# Patient Record
Sex: Female | Born: 1955 | Race: White | Hispanic: No | Marital: Married | Smoking: Former smoker
Health system: Southern US, Community
[De-identification: ages and names within clinical notes are randomized; demographics above are authoritative.]

## PROBLEM LIST (undated history)

## (undated) DIAGNOSIS — Z9889 Other specified postprocedural states: Secondary | ICD-10-CM

## (undated) DIAGNOSIS — Z923 Personal history of irradiation: Secondary | ICD-10-CM

## (undated) DIAGNOSIS — M4800 Spinal stenosis, site unspecified: Secondary | ICD-10-CM

## (undated) DIAGNOSIS — F909 Attention-deficit hyperactivity disorder, unspecified type: Secondary | ICD-10-CM

## (undated) DIAGNOSIS — F32A Depression, unspecified: Secondary | ICD-10-CM

## (undated) DIAGNOSIS — Z8659 Personal history of other mental and behavioral disorders: Secondary | ICD-10-CM

## (undated) DIAGNOSIS — Z973 Presence of spectacles and contact lenses: Secondary | ICD-10-CM

## (undated) DIAGNOSIS — R51 Headache: Secondary | ICD-10-CM

## (undated) DIAGNOSIS — E039 Hypothyroidism, unspecified: Secondary | ICD-10-CM

## (undated) DIAGNOSIS — M8580 Other specified disorders of bone density and structure, unspecified site: Secondary | ICD-10-CM

## (undated) DIAGNOSIS — F329 Major depressive disorder, single episode, unspecified: Secondary | ICD-10-CM

## (undated) DIAGNOSIS — K1120 Sialoadenitis, unspecified: Secondary | ICD-10-CM

## (undated) DIAGNOSIS — F419 Anxiety disorder, unspecified: Secondary | ICD-10-CM

## (undated) DIAGNOSIS — Z800 Family history of malignant neoplasm of digestive organs: Secondary | ICD-10-CM

## (undated) DIAGNOSIS — M797 Fibromyalgia: Secondary | ICD-10-CM

## (undated) DIAGNOSIS — M1990 Unspecified osteoarthritis, unspecified site: Secondary | ICD-10-CM

## (undated) DIAGNOSIS — R112 Nausea with vomiting, unspecified: Secondary | ICD-10-CM

## (undated) DIAGNOSIS — Z803 Family history of malignant neoplasm of breast: Secondary | ICD-10-CM

## (undated) DIAGNOSIS — L80 Vitiligo: Secondary | ICD-10-CM

## (undated) DIAGNOSIS — T7840XA Allergy, unspecified, initial encounter: Secondary | ICD-10-CM

## (undated) DIAGNOSIS — K219 Gastro-esophageal reflux disease without esophagitis: Secondary | ICD-10-CM

## (undated) DIAGNOSIS — Z8041 Family history of malignant neoplasm of ovary: Secondary | ICD-10-CM

## (undated) DIAGNOSIS — G43909 Migraine, unspecified, not intractable, without status migrainosus: Secondary | ICD-10-CM

## (undated) DIAGNOSIS — Z808 Family history of malignant neoplasm of other organs or systems: Secondary | ICD-10-CM

## (undated) DIAGNOSIS — R32 Unspecified urinary incontinence: Secondary | ICD-10-CM

## (undated) DIAGNOSIS — C50919 Malignant neoplasm of unspecified site of unspecified female breast: Secondary | ICD-10-CM

## (undated) HISTORY — DX: Headache: R51

## (undated) HISTORY — DX: Fibromyalgia: M79.7

## (undated) HISTORY — DX: Personal history of irradiation: Z92.3

## (undated) HISTORY — DX: Family history of malignant neoplasm of ovary: Z80.41

## (undated) HISTORY — DX: Allergy, unspecified, initial encounter: T78.40XA

## (undated) HISTORY — DX: Gastro-esophageal reflux disease without esophagitis: K21.9

## (undated) HISTORY — DX: Unspecified osteoarthritis, unspecified site: M19.90

## (undated) HISTORY — DX: Hypothyroidism, unspecified: E03.9

## (undated) HISTORY — DX: Family history of malignant neoplasm of other organs or systems: Z80.8

## (undated) HISTORY — DX: Vitiligo: L80

## (undated) HISTORY — DX: Attention-deficit hyperactivity disorder, unspecified type: F90.9

## (undated) HISTORY — PX: URETHRAL DILATION: SUR417

## (undated) HISTORY — PX: LAPAROSCOPIC OVARIAN CYSTECTOMY: SUR786

## (undated) HISTORY — DX: Depression, unspecified: F32.A

## (undated) HISTORY — DX: Sialoadenitis, unspecified: K11.20

## (undated) HISTORY — DX: Unspecified urinary incontinence: R32

## (undated) HISTORY — DX: Personal history of other mental and behavioral disorders: Z86.59

## (undated) HISTORY — DX: Spinal stenosis, site unspecified: M48.00

## (undated) HISTORY — PX: BREAST SURGERY: SHX581

## (undated) HISTORY — DX: Migraine, unspecified, not intractable, without status migrainosus: G43.909

## (undated) HISTORY — DX: Family history of malignant neoplasm of digestive organs: Z80.0

## (undated) HISTORY — DX: Family history of malignant neoplasm of breast: Z80.3

## (undated) HISTORY — DX: Other specified disorders of bone density and structure, unspecified site: M85.80

---
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
## 1980-06-22 HISTORY — PX: DIAGNOSTIC LAPAROSCOPY: SUR761

## 1994-06-22 HISTORY — PX: CHOLECYSTECTOMY: SHX55

## 1997-10-29 ENCOUNTER — Other Ambulatory Visit: Admission: RE | Admit: 1997-10-29 | Discharge: 1997-10-29 | Payer: Self-pay | Admitting: *Deleted

## 1998-01-23 ENCOUNTER — Ambulatory Visit (HOSPITAL_COMMUNITY): Admission: RE | Admit: 1998-01-23 | Discharge: 1998-01-23 | Payer: Self-pay | Admitting: Gastroenterology

## 1998-02-28 ENCOUNTER — Ambulatory Visit (HOSPITAL_COMMUNITY): Admission: RE | Admit: 1998-02-28 | Discharge: 1998-02-28 | Payer: Self-pay | Admitting: *Deleted

## 1999-07-21 ENCOUNTER — Ambulatory Visit (HOSPITAL_COMMUNITY): Admission: RE | Admit: 1999-07-21 | Discharge: 1999-07-21 | Payer: Self-pay | Admitting: *Deleted

## 1999-07-21 ENCOUNTER — Encounter: Payer: Self-pay | Admitting: *Deleted

## 2001-01-03 ENCOUNTER — Other Ambulatory Visit: Admission: RE | Admit: 2001-01-03 | Discharge: 2001-01-03 | Payer: Self-pay | Admitting: *Deleted

## 2001-04-29 ENCOUNTER — Ambulatory Visit (HOSPITAL_BASED_OUTPATIENT_CLINIC_OR_DEPARTMENT_OTHER): Admission: RE | Admit: 2001-04-29 | Discharge: 2001-04-29 | Payer: Self-pay | Admitting: Psychiatry

## 2002-04-18 ENCOUNTER — Other Ambulatory Visit: Admission: RE | Admit: 2002-04-18 | Discharge: 2002-04-18 | Payer: Self-pay | Admitting: Obstetrics and Gynecology

## 2002-10-12 ENCOUNTER — Encounter: Admission: RE | Admit: 2002-10-12 | Discharge: 2002-10-12 | Payer: Self-pay | Admitting: Family Medicine

## 2002-10-12 ENCOUNTER — Encounter: Payer: Self-pay | Admitting: Family Medicine

## 2003-07-24 ENCOUNTER — Other Ambulatory Visit: Admission: RE | Admit: 2003-07-24 | Discharge: 2003-07-24 | Payer: Self-pay | Admitting: Obstetrics and Gynecology

## 2004-01-01 ENCOUNTER — Ambulatory Visit (HOSPITAL_COMMUNITY): Admission: RE | Admit: 2004-01-01 | Discharge: 2004-01-01 | Payer: Self-pay | Admitting: Obstetrics and Gynecology

## 2004-08-20 ENCOUNTER — Ambulatory Visit: Payer: Self-pay | Admitting: Family Medicine

## 2004-10-07 ENCOUNTER — Other Ambulatory Visit: Admission: RE | Admit: 2004-10-07 | Discharge: 2004-10-07 | Payer: Self-pay | Admitting: *Deleted

## 2004-11-21 ENCOUNTER — Ambulatory Visit: Payer: Self-pay | Admitting: Family Medicine

## 2005-03-31 ENCOUNTER — Ambulatory Visit (HOSPITAL_COMMUNITY): Admission: RE | Admit: 2005-03-31 | Discharge: 2005-03-31 | Payer: Self-pay | Admitting: Family Medicine

## 2005-06-22 HISTORY — PX: COLONOSCOPY: SHX174

## 2005-08-07 ENCOUNTER — Encounter: Admission: RE | Admit: 2005-08-07 | Discharge: 2005-08-07 | Payer: Self-pay | Admitting: Obstetrics and Gynecology

## 2005-11-05 ENCOUNTER — Ambulatory Visit: Payer: Self-pay | Admitting: Family Medicine

## 2005-11-25 ENCOUNTER — Ambulatory Visit: Payer: Self-pay | Admitting: Family Medicine

## 2005-11-25 ENCOUNTER — Other Ambulatory Visit: Admission: RE | Admit: 2005-11-25 | Discharge: 2005-11-25 | Payer: Self-pay | Admitting: Family Medicine

## 2005-11-25 ENCOUNTER — Encounter: Payer: Self-pay | Admitting: Family Medicine

## 2006-04-08 ENCOUNTER — Encounter: Admission: RE | Admit: 2006-04-08 | Discharge: 2006-04-08 | Payer: Self-pay | Admitting: Obstetrics & Gynecology

## 2006-05-20 ENCOUNTER — Encounter: Payer: Self-pay | Admitting: Family Medicine

## 2006-06-08 ENCOUNTER — Encounter: Payer: Self-pay | Admitting: Family Medicine

## 2006-07-15 ENCOUNTER — Ambulatory Visit: Payer: Self-pay | Admitting: Family Medicine

## 2006-09-20 ENCOUNTER — Ambulatory Visit: Payer: Self-pay | Admitting: Family Medicine

## 2006-10-07 ENCOUNTER — Encounter: Admission: RE | Admit: 2006-10-07 | Discharge: 2006-10-07 | Payer: Self-pay | Admitting: Obstetrics & Gynecology

## 2006-10-08 ENCOUNTER — Other Ambulatory Visit: Admission: RE | Admit: 2006-10-08 | Discharge: 2006-10-08 | Payer: Self-pay | Admitting: Obstetrics & Gynecology

## 2006-10-12 ENCOUNTER — Encounter: Admission: RE | Admit: 2006-10-12 | Discharge: 2006-10-12 | Payer: Self-pay | Admitting: Obstetrics & Gynecology

## 2007-04-19 ENCOUNTER — Encounter: Payer: Self-pay | Admitting: Family Medicine

## 2007-05-05 ENCOUNTER — Ambulatory Visit: Payer: Self-pay | Admitting: Family Medicine

## 2007-05-05 DIAGNOSIS — M1990 Unspecified osteoarthritis, unspecified site: Secondary | ICD-10-CM | POA: Insufficient documentation

## 2007-05-05 DIAGNOSIS — E039 Hypothyroidism, unspecified: Secondary | ICD-10-CM

## 2007-05-05 DIAGNOSIS — K219 Gastro-esophageal reflux disease without esophagitis: Secondary | ICD-10-CM

## 2007-05-05 DIAGNOSIS — J309 Allergic rhinitis, unspecified: Secondary | ICD-10-CM

## 2007-05-05 DIAGNOSIS — F329 Major depressive disorder, single episode, unspecified: Secondary | ICD-10-CM

## 2007-05-10 ENCOUNTER — Encounter: Payer: Self-pay | Admitting: Family Medicine

## 2007-12-20 ENCOUNTER — Ambulatory Visit: Payer: Self-pay | Admitting: Family Medicine

## 2007-12-21 LAB — CONVERTED CEMR LAB
AST: 19 units/L (ref 0–37)
Alkaline Phosphatase: 44 units/L (ref 39–117)
Bilirubin, Direct: 0.1 mg/dL (ref 0.0–0.3)
CO2: 29 meq/L (ref 19–32)
Chloride: 107 meq/L (ref 96–112)
GFR calc Af Amer: 75 mL/min
Glucose, Bld: 98 mg/dL (ref 70–99)
HDL: 64.5 mg/dL (ref >39.0)
LDL Cholesterol: 86 mg/dL (ref 0–99)
Lymphocytes Relative: 24.8 % (ref 12.0–46.0)
Monocytes Absolute: 0.3 10*3/uL (ref 0.1–1.0)
Monocytes Relative: 5.4 % (ref 3.0–12.0)
Neutrophils Relative %: 67.5 % (ref 43.0–77.0)
Platelets: 202 10*3/uL (ref 150–400)
Potassium: 4.5 meq/L (ref 3.5–5.1)
RDW: 11.7 % (ref 11.5–14.6)
Sodium: 144 meq/L (ref 135–145)
Total CHOL/HDL Ratio: 2.6
Total Protein: 7.5 g/dL (ref 6.0–8.3)
Triglycerides: 78 mg/dL (ref 0–149)
VLDL: 16 mg/dL (ref 0–40)

## 2007-12-30 ENCOUNTER — Telehealth: Payer: Self-pay | Admitting: Family Medicine

## 2008-01-03 ENCOUNTER — Telehealth: Payer: Self-pay | Admitting: Family Medicine

## 2008-01-04 ENCOUNTER — Telehealth: Payer: Self-pay | Admitting: Family Medicine

## 2008-01-05 ENCOUNTER — Encounter: Payer: Self-pay | Admitting: Family Medicine

## 2008-01-10 ENCOUNTER — Encounter: Payer: Self-pay | Admitting: Family Medicine

## 2008-02-08 ENCOUNTER — Encounter: Payer: Self-pay | Admitting: Family Medicine

## 2008-02-15 ENCOUNTER — Encounter: Admission: RE | Admit: 2008-02-15 | Discharge: 2008-02-15 | Payer: Self-pay | Admitting: Obstetrics & Gynecology

## 2008-02-20 ENCOUNTER — Other Ambulatory Visit: Admission: RE | Admit: 2008-02-20 | Discharge: 2008-02-20 | Payer: Self-pay | Admitting: Obstetrics & Gynecology

## 2008-03-20 ENCOUNTER — Encounter: Payer: Self-pay | Admitting: Family Medicine

## 2008-07-09 ENCOUNTER — Encounter: Payer: Self-pay | Admitting: Family Medicine

## 2008-07-11 ENCOUNTER — Telehealth: Payer: Self-pay | Admitting: Family Medicine

## 2008-08-24 ENCOUNTER — Ambulatory Visit: Payer: Self-pay | Admitting: Family Medicine

## 2008-08-24 ENCOUNTER — Telehealth: Payer: Self-pay | Admitting: Family Medicine

## 2008-09-07 ENCOUNTER — Encounter: Payer: Self-pay | Admitting: Family Medicine

## 2008-09-18 ENCOUNTER — Encounter: Payer: Self-pay | Admitting: Family Medicine

## 2008-12-03 ENCOUNTER — Ambulatory Visit: Payer: Self-pay | Admitting: Family Medicine

## 2009-01-17 ENCOUNTER — Encounter: Payer: Self-pay | Admitting: Family Medicine

## 2009-02-11 ENCOUNTER — Telehealth (INDEPENDENT_AMBULATORY_CARE_PROVIDER_SITE_OTHER): Payer: Self-pay | Admitting: *Deleted

## 2009-03-13 ENCOUNTER — Encounter: Admission: RE | Admit: 2009-03-13 | Discharge: 2009-03-13 | Payer: Self-pay | Admitting: Family Medicine

## 2009-04-03 ENCOUNTER — Ambulatory Visit: Payer: Self-pay | Admitting: Family Medicine

## 2009-04-03 DIAGNOSIS — R51 Headache: Secondary | ICD-10-CM

## 2009-05-15 ENCOUNTER — Ambulatory Visit: Payer: Self-pay | Admitting: Family Medicine

## 2009-05-21 LAB — CONVERTED CEMR LAB
AST: 19 units/L (ref 0–37)
Albumin: 4 g/dL (ref 3.5–5.2)
Alkaline Phosphatase: 43 units/L (ref 39–117)
Basophils Relative: 0.6 % (ref 0.0–3.0)
CO2: 33 meq/L — ABNORMAL HIGH (ref 19–32)
Calcium: 9.6 mg/dL (ref 8.4–10.5)
Direct LDL: 119.9 mg/dL
GFR calc non Af Amer: 61.5 mL/min (ref >60)
Hemoglobin, Urine: NEGATIVE
Hemoglobin: 14 g/dL (ref 12.0–15.0)
Ketones, ur: NEGATIVE mg/dL
Lymphocytes Relative: 35.1 % (ref 12.0–46.0)
Monocytes Relative: 6 % (ref 3.0–12.0)
Neutro Abs: 2.4 10*3/uL (ref 1.4–7.7)
RBC: 4.18 M/uL (ref 3.87–5.11)
Sodium: 142 meq/L (ref 135–145)
Total CHOL/HDL Ratio: 3
Total Protein: 7.1 g/dL (ref 6.0–8.3)
Triglycerides: 74 mg/dL (ref 0.0–149.0)
Urine Glucose: NEGATIVE mg/dL
Urobilinogen, UA: 0.2 (ref 0.0–1.0)

## 2009-05-23 ENCOUNTER — Ambulatory Visit: Payer: Self-pay | Admitting: Family Medicine

## 2009-05-23 ENCOUNTER — Other Ambulatory Visit: Admission: RE | Admit: 2009-05-23 | Discharge: 2009-05-23 | Payer: Self-pay | Admitting: Family Medicine

## 2009-05-28 ENCOUNTER — Encounter: Payer: Self-pay | Admitting: Family Medicine

## 2009-08-26 ENCOUNTER — Telehealth: Payer: Self-pay | Admitting: Family Medicine

## 2009-12-26 ENCOUNTER — Telehealth: Payer: Self-pay | Admitting: Family Medicine

## 2010-02-12 ENCOUNTER — Telehealth: Payer: Self-pay | Admitting: Family Medicine

## 2010-05-13 ENCOUNTER — Encounter: Admission: RE | Admit: 2010-05-13 | Discharge: 2010-05-13 | Payer: Self-pay | Admitting: Family Medicine

## 2010-06-26 ENCOUNTER — Ambulatory Visit
Admission: RE | Admit: 2010-06-26 | Discharge: 2010-06-26 | Payer: Self-pay | Source: Home / Self Care | Attending: Family Medicine | Admitting: Family Medicine

## 2010-06-26 ENCOUNTER — Other Ambulatory Visit: Payer: Self-pay | Admitting: Family Medicine

## 2010-06-26 LAB — CONVERTED CEMR LAB
Blood in Urine, dipstick: NEGATIVE
Ketones, urine, test strip: NEGATIVE
Nitrite: NEGATIVE
Specific Gravity, Urine: 1.025
Urobilinogen, UA: 0.2

## 2010-06-26 LAB — CBC WITH DIFFERENTIAL/PLATELET
Basophils Absolute: 0 10*3/uL (ref 0.0–0.1)
Basophils Relative: 0.8 % (ref 0.0–3.0)
Eosinophils Absolute: 0.2 10*3/uL (ref 0.0–0.7)
Eosinophils Relative: 3.6 % (ref 0.0–5.0)
HCT: 38.5 % (ref 36.0–46.0)
Hemoglobin: 13.1 g/dL (ref 12.0–15.0)
Lymphocytes Relative: 46 % (ref 12.0–46.0)
Lymphs Abs: 2 10*3/uL (ref 0.7–4.0)
MCHC: 34 g/dL (ref 30.0–36.0)
MCV: 95.5 fl (ref 78.0–100.0)
Monocytes Absolute: 0.3 10*3/uL (ref 0.1–1.0)
Monocytes Relative: 6 % (ref 3.0–12.0)
Neutro Abs: 1.9 10*3/uL (ref 1.4–7.7)
Neutrophils Relative %: 43.6 % (ref 43.0–77.0)
Platelets: 207 10*3/uL (ref 150.0–400.0)
RBC: 4.03 Mil/uL (ref 3.87–5.11)
RDW: 13 % (ref 11.5–14.6)
WBC: 4.4 10*3/uL — ABNORMAL LOW (ref 4.5–10.5)

## 2010-06-27 LAB — LIPID PANEL
Cholesterol: 181 mg/dL (ref 0–200)
HDL: 54.4 mg/dL (ref >39.00)
LDL Cholesterol: 102 mg/dL — ABNORMAL HIGH (ref 0–99)
Total CHOL/HDL Ratio: 3
Triglycerides: 122 mg/dL (ref 0.0–149.0)
VLDL: 24.4 mg/dL (ref 0.0–40.0)

## 2010-06-27 LAB — BASIC METABOLIC PANEL
BUN: 11 mg/dL (ref 6–23)
CO2: 31 mEq/L (ref 19–32)
Calcium: 9.5 mg/dL (ref 8.4–10.5)
Chloride: 103 mEq/L (ref 96–112)
Creatinine, Ser: 1.1 mg/dL (ref 0.4–1.2)
GFR: 54.29 mL/min — ABNORMAL LOW (ref >60.00)
Glucose, Bld: 84 mg/dL (ref 70–99)
Potassium: 4.4 mEq/L (ref 3.5–5.1)
Sodium: 140 mEq/L (ref 135–145)

## 2010-06-27 LAB — HEPATIC FUNCTION PANEL
ALT: 18 U/L (ref 0–35)
AST: 26 U/L (ref 0–37)
Albumin: 3.7 g/dL (ref 3.5–5.2)
Alkaline Phosphatase: 39 U/L (ref 39–117)
Bilirubin, Direct: 0.1 mg/dL (ref 0.0–0.3)
Total Bilirubin: 0.5 mg/dL (ref 0.3–1.2)
Total Protein: 6.3 g/dL (ref 6.0–8.3)

## 2010-06-27 LAB — TSH: TSH: 2.29 u[IU]/mL (ref 0.35–5.50)

## 2010-07-02 ENCOUNTER — Ambulatory Visit: Admit: 2010-07-02 | Payer: Self-pay | Admitting: Family Medicine

## 2010-07-12 ENCOUNTER — Encounter: Payer: Self-pay | Admitting: Obstetrics and Gynecology

## 2010-07-13 ENCOUNTER — Encounter: Payer: Self-pay | Admitting: Obstetrics & Gynecology

## 2010-07-19 ENCOUNTER — Encounter: Payer: Self-pay | Admitting: Family Medicine

## 2010-07-22 NOTE — Progress Notes (Signed)
Summary: refill trazodone with new directions  Phone Note Call from Patient Call back at [phone]   Caller: Patient Call For: Harlei Lehrmann Summary of Call: Pt takes trazodone 100mg  and she uses it for sleep.  She takes 2 at a time instead of 1 and needs a refill for 1-2 as needed #60 at [facility] Initial call taken by: Alfred Levins, CMA,  August 26, 2009 8:59 AM  Follow-up for Phone Call        okay. Call in #60 with 11 rf Follow-up by: Nelwyn Salisbury MD,  August 26, 2009 1:31 PM  Additional Follow-up for Phone Call Additional follow up Details #1::        Rx Called In Additional Follow-up by: Raechel Ache, RN,  August 26, 2009 2:26 PM    New/Updated Medications: TRAZODONE HCL 100 MG TABS (TRAZODONE HCL) 2  by mouth at bedtime Prescriptions: TRAZODONE HCL 100 MG TABS (TRAZODONE HCL) 2  by mouth at bedtime  #60 x 11   Entered by:   Raechel Ache, RN   Authorized by:   Nelwyn Salisbury MD   Signed by:   Raechel Ache, RN on 08/26/2009   Method used:   Electronically to        [id number]       [address]       [place], [place]  [address]       Ph: [phone] or [phone]       Fax: [phone]   RxID:   [id number]

## 2010-07-22 NOTE — Progress Notes (Signed)
Summary: app for breast pain  Phone Note Call from Patient   Caller: Patient Call For: Nelwyn Salisbury MD Summary of Call: Pt is calling asking for appt for breast pain on voice mail. LMTCB [phone] Initial call taken by: Lynann Beaver CMA,  December 26, 2009 1:42 PM  Follow-up for Phone Call        No cardiac symptoms and pt prefers to wait until Dr. Clent Ridges gets back. Appt scheduled and cardiac alert signs discussed, and what to do regarding ER.  ? costochondritis??? Follow-up by: Lynann Beaver CMA,  December 26, 2009 2:19 PM

## 2010-07-22 NOTE — Progress Notes (Signed)
Summary: refill alprazolam  Phone Note Refill Request Message from:  Fax from Pharmacy on February 12, 2010 8:26 AM  Refills Requested: Medication #1:  XANAX 0.5 MG TABS two times a day as needed anxiety   Supply Requested: 1 month   Last Refilled: 03/06/2009   Notes: refill is for alprazolam 1mg  tid prn  Method Requested: Fax to Local Pharmacy Initial call taken by: Raechel Ache, RN,  February 12, 2010 8:28 AM Caller: [facility]  Follow-up for Phone Call        done, in your box Follow-up by: Nelwyn Salisbury MD,  February 12, 2010 4:43 PM    New/Updated Medications: XANAX 0.5 MG TABS (ALPRAZOLAM) two times a day as needed anxiety Prescriptions: XANAX 0.5 MG TABS (ALPRAZOLAM) two times a day as needed anxiety  #180 x 1   Entered and Authorized by:   Nelwyn Salisbury MD   Signed by:   Nelwyn Salisbury MD on 02/12/2010   Method used:   Print then Give to Patient   RxID:   [id number]  [facility]

## 2010-07-29 ENCOUNTER — Other Ambulatory Visit: Payer: Self-pay | Admitting: Family Medicine

## 2010-07-29 DIAGNOSIS — M1990 Unspecified osteoarthritis, unspecified site: Secondary | ICD-10-CM

## 2010-07-30 ENCOUNTER — Ambulatory Visit (INDEPENDENT_AMBULATORY_CARE_PROVIDER_SITE_OTHER): Payer: 59 | Admitting: Family Medicine

## 2010-07-30 ENCOUNTER — Encounter: Payer: Self-pay | Admitting: Family Medicine

## 2010-07-30 VITALS — BP 110/70 | HR 100 | Ht 66.0 in | Wt 174.0 lb

## 2010-07-30 DIAGNOSIS — Z136 Encounter for screening for cardiovascular disorders: Secondary | ICD-10-CM

## 2010-07-30 DIAGNOSIS — Z0000 Encounter for general adult medical examination without abnormal findings: Secondary | ICD-10-CM

## 2010-07-30 DIAGNOSIS — M1990 Unspecified osteoarthritis, unspecified site: Secondary | ICD-10-CM

## 2010-07-30 MED ORDER — TRAZODONE HCL 100 MG PO TABS: 200.0000 mg | ORAL_TABLET | Freq: Every day | ORAL | Status: DC

## 2010-07-30 MED ORDER — ALPRAZOLAM 0.5 MG PO TABS: 0.5000 mg | ORAL_TABLET | Freq: Two times a day (BID) | ORAL | Status: DC | PRN

## 2010-07-30 MED ORDER — LEVOTHYROXINE SODIUM 100 MCG PO TABS: 100.0000 ug | ORAL_TABLET | Freq: Every day | ORAL | Status: DC

## 2010-07-30 MED ORDER — CELECOXIB 100 MG PO CAPS: 200.0000 mg | ORAL_CAPSULE | Freq: Two times a day (BID) | ORAL | Status: DC

## 2010-07-30 MED ORDER — LEVOTHYROXINE SODIUM 112 MCG PO TABS: 112.0000 ug | ORAL_TABLET | Freq: Every day | ORAL | Status: DC

## 2010-07-30 NOTE — Progress Notes (Signed)
  Subjective:    Patient ID: Kristina Pearson, female    DOB: [birth year], 55 y.o.   MRN: [id number]  HPI 55 yr old female for a cpx. She feels well with no concerns.    Review of Systems  Constitutional: Negative.  Negative for fever, diaphoresis, activity change, appetite change, fatigue and unexpected weight change.  HENT: Negative.  Negative for hearing loss, ear pain, nosebleeds, congestion, sore throat, trouble swallowing, neck pain, neck stiffness, voice change and tinnitus.   Eyes: Negative.  Negative for photophobia, pain, discharge, redness and visual disturbance.  Respiratory: Negative.  Negative for apnea, cough, choking, chest tightness, shortness of breath, wheezing and stridor.   Cardiovascular: Negative.  Negative for chest pain, palpitations and leg swelling.  Gastrointestinal: Negative.  Negative for nausea, vomiting, abdominal pain, diarrhea, constipation, blood in stool, abdominal distention and rectal pain.  Genitourinary: Negative.  Negative for dysuria, urgency, frequency, flank pain, scrotal swelling, enuresis, difficulty urinating and testicular pain.  Musculoskeletal: Negative.  Negative for myalgias, back pain, joint swelling, arthralgias and gait problem.  Skin: Negative.  Negative for color change, pallor, rash and wound.  Neurological: Negative.  Negative for dizziness, tremors, seizures, syncope, speech difficulty, weakness, light-headedness, numbness and headaches.  Hematological: Negative.  Negative for adenopathy. Does not bruise/bleed easily.  Psychiatric/Behavioral: Negative.  Negative for hallucinations, behavioral problems, confusion, sleep disturbance, dysphoric mood and agitation. The patient is not nervous/anxious.        Objective:   Physical Exam  Constitutional: She appears well-developed and well-nourished. No distress.  HENT:  Head: Normocephalic and atraumatic.  Right Ear: External ear normal.  Left Ear: External ear normal.  Nose: Nose normal.    Mouth/Throat: Oropharynx is clear and moist. No oropharyngeal exudate.  Eyes: Conjunctivae and EOM are normal. Pupils are equal, round, and reactive to light. Right eye exhibits no discharge. Left eye exhibits no discharge. No scleral icterus.  Neck: Normal range of motion. Neck supple. No JVD present. No thyromegaly present.  Cardiovascular: Normal rate, regular rhythm, normal heart sounds and intact distal pulses.  Exam reveals no gallop and no friction rub.   No murmur heard. Pulmonary/Chest: Effort normal and breath sounds normal. No stridor. No respiratory distress. She has no wheezes. She has no rales. She exhibits no tenderness.  Abdominal: Soft. Normal appearance and bowel sounds are normal. She exhibits no distension, no abdominal bruit, no ascites and no mass. There is no hepatosplenomegaly. There is no tenderness. There is no rigidity, no rebound and no guarding. No hernia.  Musculoskeletal: Normal range of motion. She exhibits no edema and no tenderness.  Lymphadenopathy:    She has no cervical adenopathy.  Neurological: She is alert. She has normal reflexes. No cranial nerve deficit. She exhibits normal muscle tone. Coordination normal.  Skin: Skin is warm and dry. No rash noted. She is not diaphoretic. No erythema. No pallor.  Psychiatric: She has a normal mood and affect. Her behavior is normal. Judgment and thought content normal.          Assessment & Plan:  Normal exam.

## 2010-08-07 ENCOUNTER — Telehealth: Payer: Self-pay | Admitting: Family Medicine

## 2010-08-07 NOTE — Telephone Encounter (Signed)
Pt req samples for Synthroid , until her mail order arrives. Pls let pt know if samples are available.

## 2010-08-07 NOTE — Telephone Encounter (Signed)
Pt notified samples synthroid 112 mcg  #28 lot [id number] exp 11-2010 Samples at front desk.

## 2010-08-21 ENCOUNTER — Ambulatory Visit: Payer: 59 | Admitting: Family Medicine

## 2010-08-22 ENCOUNTER — Telehealth: Payer: Self-pay | Admitting: Family Medicine

## 2010-08-22 NOTE — Telephone Encounter (Signed)
rx called in, pt aware 

## 2010-08-22 NOTE — Telephone Encounter (Signed)
Call in Xanax 0.5 mg bid #60 with no rf

## 2010-08-22 NOTE — Telephone Encounter (Signed)
Pt needs 14 day supply of alprazolam 0.5mg  call [facility] [address] [phone]. Pt is waiting on mailorder rx

## 2010-08-28 ENCOUNTER — Other Ambulatory Visit: Payer: Self-pay | Admitting: Family Medicine

## 2010-08-28 NOTE — Telephone Encounter (Signed)
[facility] has ?concerning synthoid. Please call [phone] opt 2

## 2010-09-01 ENCOUNTER — Other Ambulatory Visit: Payer: Self-pay | Admitting: Family Medicine

## 2010-09-02 ENCOUNTER — Other Ambulatory Visit: Payer: Self-pay

## 2010-09-02 NOTE — Telephone Encounter (Signed)
Received another call from [facility] ref # [id number] on dosage of synthroid stated on August 22 2010 they received a fax from dr fry with pt on 100 mcg and 112 mcg . Dr Clent Ridges is aware and awaiting  Return call from pt.

## 2010-09-02 NOTE — Telephone Encounter (Addendum)
Spoke with joan from [facility]  she stated she sent out synthroid 112 and 100 mcg which one was from dr fry and Dr Horald Pollen . Dr Clent Ridges aware and requested to call pt regarding this.  Called pt left mess for her to return call

## 2010-09-02 NOTE — Telephone Encounter (Signed)
Pt returned call and stated Dr Horald Pollen does have her on Synthroid alternating with 112 next day. Dr Clent Ridges aware.

## 2010-10-28 ENCOUNTER — Ambulatory Visit: Payer: 59 | Admitting: Psychology

## 2010-12-25 ENCOUNTER — Ambulatory Visit (INDEPENDENT_AMBULATORY_CARE_PROVIDER_SITE_OTHER): Payer: 59 | Admitting: Psychology

## 2010-12-25 DIAGNOSIS — F4323 Adjustment disorder with mixed anxiety and depressed mood: Secondary | ICD-10-CM

## 2010-12-30 ENCOUNTER — Ambulatory Visit (INDEPENDENT_AMBULATORY_CARE_PROVIDER_SITE_OTHER): Payer: 59 | Admitting: Psychology

## 2010-12-30 DIAGNOSIS — F4323 Adjustment disorder with mixed anxiety and depressed mood: Secondary | ICD-10-CM

## 2011-01-13 ENCOUNTER — Other Ambulatory Visit: Payer: Self-pay

## 2011-01-13 DIAGNOSIS — Z0000 Encounter for general adult medical examination without abnormal findings: Secondary | ICD-10-CM

## 2011-01-13 NOTE — Telephone Encounter (Signed)
Last filled 08/22/2010. Last OV 07/30/10

## 2011-01-15 ENCOUNTER — Ambulatory Visit: Payer: 59 | Admitting: Psychology

## 2011-01-15 MED ORDER — ALPRAZOLAM 0.5 MG PO TABS: 0.5000 mg | ORAL_TABLET | Freq: Two times a day (BID) | ORAL | Status: DC | PRN

## 2011-01-15 NOTE — Telephone Encounter (Signed)
Script called in

## 2011-01-15 NOTE — Telephone Encounter (Signed)
Call in #180 with one rf  

## 2011-01-27 ENCOUNTER — Ambulatory Visit (INDEPENDENT_AMBULATORY_CARE_PROVIDER_SITE_OTHER): Payer: 59 | Admitting: Psychology

## 2011-01-27 DIAGNOSIS — F4323 Adjustment disorder with mixed anxiety and depressed mood: Secondary | ICD-10-CM

## 2011-01-30 ENCOUNTER — Ambulatory Visit (INDEPENDENT_AMBULATORY_CARE_PROVIDER_SITE_OTHER): Payer: 59 | Admitting: Family Medicine

## 2011-01-30 ENCOUNTER — Encounter: Payer: Self-pay | Admitting: Family Medicine

## 2011-01-30 VITALS — BP 102/64 | HR 78 | Temp 98.0°F | Wt 170.0 lb

## 2011-01-30 DIAGNOSIS — F909 Attention-deficit hyperactivity disorder, unspecified type: Secondary | ICD-10-CM

## 2011-01-30 DIAGNOSIS — L989 Disorder of the skin and subcutaneous tissue, unspecified: Secondary | ICD-10-CM

## 2011-01-30 MED ORDER — LISDEXAMFETAMINE DIMESYLATE 20 MG PO CAPS: 20.0000 mg | ORAL_CAPSULE | ORAL | Status: DC

## 2011-01-30 NOTE — Progress Notes (Signed)
  Subjective:    Patient ID: Kristina Pearson, female    DOB: Feb 20, 1956, 55 y.o.   MRN: [id number]  HPI Here for 2 issues. First she has had a lesion on the left lower leg for several years. It was stable for a few years, but over the past 6 months it has rapidly gotten bigger and it now itches. Second, she feels she has ADHD. She had trouble in school and has a lot of trouble now on her job with focusing on work, getting things done on time, and forgetting simple things. This has always caused her to be stressed because she is always behind. Her daughter (age 46)  has ADHD and takes Vyvanse. Kristina Pearson took a few of these last week and was amazed at how much better she felt and how well she performed at work. No side effects at all.    Review of Systems  Constitutional: Negative.   Skin: Positive for color change. Negative for pallor, rash and wound.  Psychiatric/Behavioral: Positive for decreased concentration. Negative for hallucinations, behavioral problems, confusion, dysphoric mood and agitation. The patient is nervous/anxious. The patient is not hyperactive.        Objective:   Physical Exam  Constitutional: She is oriented to person, place, and time. She appears well-developed and well-nourished.  Neurological: She is alert and oriented to person, place, and time.  Skin:       The anterior left lower leg has a macular lesion about 1.0 cm in diameter. This is well marginated. Parts of it are dark tan in color, and parts are depigmented.   Psychiatric: She has a normal mood and affect. Her behavior is normal. Thought content normal.          Assessment & Plan:  I do think she has ADHD, and we will try Vyvanse. Recheck in one month. Refer to Dermatology for the skin lesion, since these recent changes are a bit worrisome.

## 2011-02-09 ENCOUNTER — Other Ambulatory Visit: Payer: Self-pay

## 2011-02-09 NOTE — Telephone Encounter (Signed)
Fax refill request from [facility] for alprazolam  Last seen 01/30/11   Last written 01/14/2011 #180 1rf BUT CALLED INTO [facility] PLEASE ADVISE

## 2011-02-10 ENCOUNTER — Ambulatory Visit (INDEPENDENT_AMBULATORY_CARE_PROVIDER_SITE_OTHER): Payer: 59 | Admitting: Psychology

## 2011-02-10 DIAGNOSIS — F4323 Adjustment disorder with mixed anxiety and depressed mood: Secondary | ICD-10-CM

## 2011-02-10 NOTE — Telephone Encounter (Signed)
Please change this rx to [facility], same instructions

## 2011-02-10 NOTE — Telephone Encounter (Signed)
Refill request for [facility] was faxed.

## 2011-02-17 ENCOUNTER — Ambulatory Visit (INDEPENDENT_AMBULATORY_CARE_PROVIDER_SITE_OTHER): Payer: 59 | Admitting: Psychology

## 2011-02-17 DIAGNOSIS — F4323 Adjustment disorder with mixed anxiety and depressed mood: Secondary | ICD-10-CM

## 2011-03-05 ENCOUNTER — Ambulatory Visit (INDEPENDENT_AMBULATORY_CARE_PROVIDER_SITE_OTHER): Payer: 59 | Admitting: Psychology

## 2011-03-05 DIAGNOSIS — F4323 Adjustment disorder with mixed anxiety and depressed mood: Secondary | ICD-10-CM

## 2011-03-19 ENCOUNTER — Ambulatory Visit (INDEPENDENT_AMBULATORY_CARE_PROVIDER_SITE_OTHER): Payer: 59 | Admitting: Psychology

## 2011-03-19 DIAGNOSIS — F4323 Adjustment disorder with mixed anxiety and depressed mood: Secondary | ICD-10-CM

## 2011-04-03 ENCOUNTER — Other Ambulatory Visit: Payer: Self-pay | Admitting: Family Medicine

## 2011-04-03 MED ORDER — TRAZODONE HCL 100 MG PO TABS: 100.0000 mg | ORAL_TABLET | Freq: Every day | ORAL | Status: DC

## 2011-04-03 NOTE — Telephone Encounter (Addendum)
Pt need new rx trazodone 100mg  #60 a month( 2 pills at night). Please call [facility] [phone]. Pt was getting med from [facility]. Pt has only 2 pills left.

## 2011-04-03 NOTE — Telephone Encounter (Signed)
Call in #60 with 11 rf 

## 2011-04-03 NOTE — Telephone Encounter (Signed)
Script called in and pt aware. 

## 2011-04-08 ENCOUNTER — Ambulatory Visit (INDEPENDENT_AMBULATORY_CARE_PROVIDER_SITE_OTHER): Payer: BC Managed Care – PPO | Admitting: Family Medicine

## 2011-04-08 ENCOUNTER — Encounter: Payer: Self-pay | Admitting: Family Medicine

## 2011-04-08 VITALS — BP 116/78 | HR 81 | Temp 98.7°F | Wt 168.0 lb

## 2011-04-08 DIAGNOSIS — Z23 Encounter for immunization: Secondary | ICD-10-CM

## 2011-04-08 DIAGNOSIS — F909 Attention-deficit hyperactivity disorder, unspecified type: Secondary | ICD-10-CM

## 2011-04-08 MED ORDER — LISDEXAMFETAMINE DIMESYLATE 20 MG PO CAPS: 20.0000 mg | ORAL_CAPSULE | ORAL | Status: DC

## 2011-04-08 NOTE — Progress Notes (Signed)
  Subjective:    Patient ID: Kristina Pearson, female    DOB: December 03, 1955, 55 y.o.   MRN: [id number]  HPI Here to follow upon newly diagnosed ADHD. She has been very pleased with Vyvanse and wants to stay on this. She is much more focused at work and is able to keep up with the work load. No side effects.    Review of Systems  Constitutional: Negative.   Psychiatric/Behavioral: Negative.        Objective:   Physical Exam  Constitutional: She appears well-developed and well-nourished.  Psychiatric: She has a normal mood and affect. Her behavior is normal. Thought content normal.          Assessment & Plan:  Stable ADHD. Refilled meds.

## 2011-04-08 NOTE — Progress Notes (Signed)
Addended by: Aniceto Boss A on: 04/08/2011 05:01 PM   Modules accepted: Orders

## 2011-04-16 ENCOUNTER — Ambulatory Visit: Payer: 59 | Admitting: Psychology

## 2011-05-16 MED ORDER — CEFAZOLIN SODIUM 1-5 GM-% IV SOLN
INTRAVENOUS | Status: AC
  Filled 2011-05-16: qty 50

## 2011-05-16 MED ORDER — PANTOPRAZOLE SODIUM 40 MG PO TBEC
DELAYED_RELEASE_TABLET | ORAL | Status: AC
  Filled 2011-05-16: qty 1

## 2011-05-16 MED ORDER — SCOPOLAMINE 1 MG/3DAYS TD PT72
MEDICATED_PATCH | TRANSDERMAL | Status: AC
  Filled 2011-05-16: qty 1

## 2011-05-28 ENCOUNTER — Telehealth: Payer: Self-pay | Admitting: Family Medicine

## 2011-05-28 ENCOUNTER — Ambulatory Visit (INDEPENDENT_AMBULATORY_CARE_PROVIDER_SITE_OTHER): Payer: BC Managed Care – PPO | Admitting: Psychology

## 2011-05-28 DIAGNOSIS — F4323 Adjustment disorder with mixed anxiety and depressed mood: Secondary | ICD-10-CM

## 2011-05-28 NOTE — Telephone Encounter (Signed)
Pt has a very sorethroat and swollen gland on one side. Pt req work in [employer] for tomorrow with Dr Clent Ridges.

## 2011-05-28 NOTE — Telephone Encounter (Signed)
Left message to call back  

## 2011-05-29 NOTE — Telephone Encounter (Signed)
We could certainly see her but she would not to have this approved by Workers Comp first

## 2011-05-29 NOTE — Telephone Encounter (Signed)
Pt is aware and has an appt on 06/01/11 at 9:30

## 2011-05-29 NOTE — Telephone Encounter (Signed)
Pt called and states she hurt her back at work right before Thanksgiving.  Pt was sent to Urgent Care for Worker's Comp and pt states she is still having problems.  Pt was given Flexeril and can only take it at night because it makes her drowsy.  Pt is having trouble making it through the day.  An x-ray was taken of pt's back and she was told she had sciatica and her spine looks good but she may have a bulge at the bottom. Pt would like to know if she can make an appt with Dr. Clent Ridges to further discuss this matter.  Pls advise.

## 2011-06-01 ENCOUNTER — Ambulatory Visit: Payer: BC Managed Care – PPO | Admitting: Family Medicine

## 2011-06-02 ENCOUNTER — Encounter: Payer: Self-pay | Admitting: Family Medicine

## 2011-06-02 ENCOUNTER — Ambulatory Visit (INDEPENDENT_AMBULATORY_CARE_PROVIDER_SITE_OTHER): Payer: BC Managed Care – PPO | Admitting: Family Medicine

## 2011-06-02 DIAGNOSIS — K5900 Constipation, unspecified: Secondary | ICD-10-CM

## 2011-06-02 DIAGNOSIS — M545 Low back pain: Secondary | ICD-10-CM

## 2011-06-02 LAB — POCT URINALYSIS DIPSTICK
Bilirubin, UA: NEGATIVE
Glucose, UA: NEGATIVE
Nitrite, UA: NEGATIVE
Spec Grav, UA: 1.01
Urobilinogen, UA: 0.2

## 2011-06-02 NOTE — Progress Notes (Signed)
  Subjective:    Patient ID: Kristina Pearson, female    DOB: [birth year], 55 y.o.   MRN: [id number]  HPI Here to discuss several issues. First while on her job on 05-13-11 she tried to lift a heavy Christmas tree and injured her lower back. 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 She was sent to an Urgent Care clinic where she has been seen twice I believe. Xrays were negative. She was already on Celebrex daily so they added Flexeril to this. The pain is better but she still has some stiffness and pain in the lower back. Around this time she also began to have some constipation with bloating and having a small BM only every 3-4 days. She tried some stool softeners with no response. She also describes a sensation that she does not empty her bladder completely when she urinates. No burning. No fever.   Review of Systems  Constitutional: Negative.   Gastrointestinal: Positive for constipation. Negative for nausea, vomiting, abdominal pain and abdominal distention.  Genitourinary: Positive for difficulty urinating. Negative for dysuria, frequency and flank pain.  Musculoskeletal: Positive for back pain.       Objective:   Physical Exam  Constitutional: She appears well-developed and well-nourished.  Abdominal: Soft. Bowel sounds are normal. She exhibits no distension and no mass. There is no tenderness. There is no rebound and no guarding.          Assessment & Plan:  She will follow up with the Workers Comp doctors about her back injury. I think her bladder symptoms are secondary to her back pain. For the constipation, try Miralax daily.

## 2011-06-09 ENCOUNTER — Ambulatory Visit: Payer: Worker's Compensation

## 2011-06-11 ENCOUNTER — Ambulatory Visit (INDEPENDENT_AMBULATORY_CARE_PROVIDER_SITE_OTHER): Payer: BC Managed Care – PPO | Admitting: Psychology

## 2011-06-11 DIAGNOSIS — F4323 Adjustment disorder with mixed anxiety and depressed mood: Secondary | ICD-10-CM

## 2011-06-17 ENCOUNTER — Ambulatory Visit: Payer: Worker's Compensation

## 2011-06-22 ENCOUNTER — Ambulatory Visit: Payer: Worker's Compensation

## 2011-07-01 ENCOUNTER — Ambulatory Visit: Payer: Self-pay

## 2011-07-07 ENCOUNTER — Ambulatory Visit (INDEPENDENT_AMBULATORY_CARE_PROVIDER_SITE_OTHER): Payer: BC Managed Care – PPO | Admitting: Psychology

## 2011-07-07 DIAGNOSIS — F4323 Adjustment disorder with mixed anxiety and depressed mood: Secondary | ICD-10-CM

## 2011-07-09 ENCOUNTER — Ambulatory Visit: Payer: Self-pay

## 2011-07-16 ENCOUNTER — Ambulatory Visit (INDEPENDENT_AMBULATORY_CARE_PROVIDER_SITE_OTHER): Payer: BC Managed Care – PPO | Admitting: Psychology

## 2011-07-16 DIAGNOSIS — F4323 Adjustment disorder with mixed anxiety and depressed mood: Secondary | ICD-10-CM

## 2011-07-27 DIAGNOSIS — Z0271 Encounter for disability determination: Secondary | ICD-10-CM

## 2011-07-30 ENCOUNTER — Ambulatory Visit: Payer: BC Managed Care – PPO | Admitting: Psychology

## 2011-07-31 ENCOUNTER — Ambulatory Visit: Payer: BC Managed Care – PPO | Admitting: Psychology

## 2011-08-06 ENCOUNTER — Ambulatory Visit (INDEPENDENT_AMBULATORY_CARE_PROVIDER_SITE_OTHER): Payer: BC Managed Care – PPO | Admitting: Psychology

## 2011-08-06 DIAGNOSIS — F4323 Adjustment disorder with mixed anxiety and depressed mood: Secondary | ICD-10-CM

## 2011-08-07 ENCOUNTER — Other Ambulatory Visit: Payer: Self-pay | Admitting: Obstetrics & Gynecology

## 2011-08-07 DIAGNOSIS — Z1231 Encounter for screening mammogram for malignant neoplasm of breast: Secondary | ICD-10-CM

## 2011-08-13 ENCOUNTER — Ambulatory Visit (INDEPENDENT_AMBULATORY_CARE_PROVIDER_SITE_OTHER): Payer: BC Managed Care – PPO | Admitting: Psychology

## 2011-08-13 DIAGNOSIS — F4323 Adjustment disorder with mixed anxiety and depressed mood: Secondary | ICD-10-CM

## 2011-08-18 ENCOUNTER — Telehealth: Payer: Self-pay | Admitting: Family Medicine

## 2011-08-18 DIAGNOSIS — Z0000 Encounter for general adult medical examination without abnormal findings: Secondary | ICD-10-CM

## 2011-08-18 NOTE — Telephone Encounter (Signed)
Refill request for Alprazolam 0.5 mg take 1 po bid and pt last here on 06/02/11. ( request a 90 day supply )

## 2011-08-19 MED ORDER — ALPRAZOLAM 0.5 MG PO TABS: 0.5000 mg | ORAL_TABLET | Freq: Two times a day (BID) | ORAL | Status: DC | PRN

## 2011-08-19 NOTE — Telephone Encounter (Signed)
Rx called in to pharmacy. 

## 2011-08-19 NOTE — Telephone Encounter (Signed)
Call in #180 with one rf  

## 2011-08-20 ENCOUNTER — Ambulatory Visit: Payer: BC Managed Care – PPO

## 2011-08-25 ENCOUNTER — Ambulatory Visit
Admission: RE | Admit: 2011-08-25 | Discharge: 2011-08-25 | Disposition: A | Discharge status: Home / Self Care | Payer: BC Managed Care – PPO | Source: Ambulatory Visit | Attending: Obstetrics & Gynecology | Admitting: Obstetrics & Gynecology

## 2011-08-25 ENCOUNTER — Other Ambulatory Visit: Payer: Self-pay | Admitting: Obstetrics & Gynecology

## 2011-08-25 ENCOUNTER — Ambulatory Visit: Admission: RE | Admit: 2011-08-25 | Payer: BC Managed Care – PPO | Source: Ambulatory Visit

## 2011-08-25 DIAGNOSIS — Z780 Asymptomatic menopausal state: Secondary | ICD-10-CM

## 2011-08-25 DIAGNOSIS — Z1231 Encounter for screening mammogram for malignant neoplasm of breast: Secondary | ICD-10-CM

## 2011-08-27 ENCOUNTER — Ambulatory Visit (INDEPENDENT_AMBULATORY_CARE_PROVIDER_SITE_OTHER): Payer: BC Managed Care – PPO | Admitting: Psychology

## 2011-08-27 DIAGNOSIS — F4323 Adjustment disorder with mixed anxiety and depressed mood: Secondary | ICD-10-CM

## 2011-09-24 ENCOUNTER — Ambulatory Visit (INDEPENDENT_AMBULATORY_CARE_PROVIDER_SITE_OTHER): Payer: BC Managed Care – PPO | Admitting: Psychology

## 2011-09-24 DIAGNOSIS — F4323 Adjustment disorder with mixed anxiety and depressed mood: Secondary | ICD-10-CM

## 2011-10-16 ENCOUNTER — Ambulatory Visit (INDEPENDENT_AMBULATORY_CARE_PROVIDER_SITE_OTHER): Payer: BC Managed Care – PPO | Admitting: Family Medicine

## 2011-10-16 ENCOUNTER — Encounter: Payer: Self-pay | Admitting: Family Medicine

## 2011-10-16 VITALS — BP 116/72 | HR 98 | Temp 98.8°F | Ht 66.5 in | Wt 164.0 lb

## 2011-10-16 DIAGNOSIS — K219 Gastro-esophageal reflux disease without esophagitis: Secondary | ICD-10-CM

## 2011-10-16 DIAGNOSIS — K2970 Gastritis, unspecified, without bleeding: Secondary | ICD-10-CM

## 2011-10-16 MED ORDER — OMEPRAZOLE 40 MG PO CPDR: 40.0000 mg | DELAYED_RELEASE_CAPSULE | Freq: Every day | ORAL | Status: DC

## 2011-10-16 NOTE — Progress Notes (Signed)
  Subjective:    Patient ID: Kristina Pearson, female    DOB: 11-03-1955, 56 y.o.   MRN: [id number]  HPI Here for one week of diffuse abdominal pains, belching, and loose stools. No nausea or vomiting. No fever. She has been seeing Dr. Margreta Journey for a Workers Comp injury to the lower back. She took a steroid taper pack several weeks ago, and she has been taking Celebrex daily since this was finished.    Review of Systems  Constitutional: Negative.   Respiratory: Negative.   Cardiovascular: Negative.   Gastrointestinal: Positive for abdominal pain. Negative for nausea, vomiting, diarrhea, constipation, blood in stool, abdominal distention, anal bleeding and rectal pain.       Objective:   Physical Exam  Constitutional: She appears well-developed and well-nourished.  Abdominal: Soft. Bowel sounds are normal. She exhibits no distension and no mass. There is no rebound and no guarding.       Mildly tender in both upper quadrants           Assessment & Plan:  This is consistent with GERD and gastritis from the steroids and NSAIDs she has been taking lately. Add Omeprazole daily. Recheck prn

## 2011-10-19 ENCOUNTER — Encounter: Payer: Self-pay | Admitting: Family

## 2011-10-19 ENCOUNTER — Ambulatory Visit (INDEPENDENT_AMBULATORY_CARE_PROVIDER_SITE_OTHER): Payer: BC Managed Care – PPO | Admitting: Family

## 2011-10-19 VITALS — BP 120/70 | Temp 98.3°F | Wt 170.0 lb

## 2011-10-19 DIAGNOSIS — M545 Low back pain: Secondary | ICD-10-CM

## 2011-10-19 DIAGNOSIS — N390 Urinary tract infection, site not specified: Secondary | ICD-10-CM

## 2011-10-19 LAB — POCT URINALYSIS DIPSTICK
Bilirubin, UA: NEGATIVE
Spec Grav, UA: <=1.005
Urobilinogen, UA: 2
pH, UA: 5

## 2011-10-19 MED ORDER — SULFAMETHOXAZOLE-TRIMETHOPRIM 800-160 MG PO TABS: 1.0000 | ORAL_TABLET | Freq: Two times a day (BID) | ORAL | Status: AC

## 2011-10-19 NOTE — Progress Notes (Signed)
Subjective:    Patient ID: Kristina Pearson, female    DOB: [birth year], 56 y.o.   MRN: [id number]  HPI 56 year old white female, nonsmoker, patient of Dr. Clent Ridges is in with complaints of abdominal pain, low back pain, urinary frequency, urgency, fever and chills x 3 days. She denies any burning with urination. Has a history of urinary tract infections. Denies any lightheadedness, dizziness, chest pain, palpitations, shortness of breath or edema.   Review of Systems  Constitutional: Negative.   Respiratory: Negative.   Cardiovascular: Negative.   Gastrointestinal: Negative.   Genitourinary: Positive for dysuria, urgency and frequency.  Musculoskeletal: Negative.   Hematological: Negative.   Psychiatric/Behavioral: Negative.    Past Medical History  Diagnosis Date  . Depression   . Allergy   . Hypothyroid   . Sialoadenitis   . GERD (gastroesophageal reflux disease)   . Headache   . Arthritis     osteoarthritrs  . ADHD (attention deficit hyperactivity disorder)     History   Social History  . Marital Status: Married    Spouse Name: N/A    Number of Children: N/A  . Years of Education: N/A   Occupational History  . Not on file.   Social History Main Topics  . Smoking status: Former Smoker    Quit date: 06/23/1983  . Smokeless tobacco: Never Used  . Alcohol Use: 1.0 oz/week    2 drink(s) per week  . Drug Use: No  . Sexually Active: Not on file   Other Topics Concern  . Not on file   Social History Narrative  . No narrative on file    Past Surgical History  Procedure Date  . Cholecystectomy 1996    Family History  Problem Relation Age of Onset  . Arthritis Mother   . Diabetes Mother   . Heart attack Father     No Known Allergies  Current Outpatient Prescriptions on File Prior to Visit  Medication Sig Dispense Refill  . ALPRAZolam (XANAX) 0.5 MG tablet Take 1 tablet (0.5 mg total) by mouth 2 (two) times daily as needed.  180 tablet  1  . calcium-vitamin D  185 MG TABS Take 185 mg by mouth daily.        . celecoxib (CELEBREX) 100 MG capsule Take 2 capsules (200 mg total) by mouth 2 (two) times daily.  180 capsule  3  . cholecalciferol (VITAMIN D) 1000 UNIT tablet Take 1,000 Units by mouth daily.        . Diclofenac Epolamine (FLECTOR) 1.3 % PTCH Place onto the skin as needed.        . fexofenadine (ALLEGRA) 180 MG tablet Take 180 mg by mouth as needed.       Marland Kitchen glucosamine-chondroitin 500-400 MG tablet Take 1 tablet by mouth daily.        Marland Kitchen KRILL OIL PO Take by mouth daily.        Marland Kitchen levothyroxine (SYNTHROID, LEVOTHROID) 112 MCG tablet Take 1 tablet (112 mcg total) by mouth daily.  90 tablet  3  . lisdexamfetamine (VYVANSE) 20 MG capsule Take 20 mg by mouth as needed.        . Multiple Vitamin (MULTIVITAMIN) tablet Take 1 tablet by mouth daily.        Marland Kitchen omeprazole (PRILOSEC) 40 MG capsule Take 1 capsule (40 mg total) by mouth daily.  30 capsule  11  . traZODone (DESYREL) 100 MG tablet Take 100 mg by mouth daily. Take 2 tablets at bedtime       .  lisdexamfetamine (VYVANSE) 20 MG capsule Take 1 capsule (20 mg total) by mouth every morning.  30 capsule  0    BP 120/70  Temp(Src) 98.3 F (36.8 C) (Oral)  Wt 170 lb (77.111 kg)chart     Objective:   Physical Exam  Constitutional: She is oriented to person, place, and time. She appears well-developed and well-nourished.  Neck: Normal range of motion. Neck supple.  Cardiovascular: Normal rate, regular rhythm and normal heart sounds.   Pulmonary/Chest: Effort normal and breath sounds normal.  Abdominal: Soft. Bowel sounds are normal. There is tenderness.  Neurological: She is alert and oriented to person, place, and time.  Skin: Skin is warm and dry.  Psychiatric: She has a normal mood and affect.          Assessment & Plan:  Assessment: Urinary tract infection, low back pain, abdominal pain  Plan: Bactrim DS 1 tablet twice a day x7 days. Drink plenty of fluids. Avoid caffeine. Patient call  the office if symptoms worsen or persist. Recheck a schedule, and when necessary.

## 2011-10-19 NOTE — Patient Instructions (Signed)

## 2011-11-05 ENCOUNTER — Ambulatory Visit: Payer: BC Managed Care – PPO | Admitting: Family Medicine

## 2011-11-06 ENCOUNTER — Encounter: Payer: BC Managed Care – PPO | Admitting: Internal Medicine

## 2011-11-06 ENCOUNTER — Ambulatory Visit (INDEPENDENT_AMBULATORY_CARE_PROVIDER_SITE_OTHER): Payer: BC Managed Care – PPO | Admitting: Family Medicine

## 2011-11-06 ENCOUNTER — Encounter: Payer: Self-pay | Admitting: Family Medicine

## 2011-11-06 VITALS — BP 108/70 | HR 82 | Temp 98.8°F | Wt 167.0 lb

## 2011-11-06 DIAGNOSIS — N390 Urinary tract infection, site not specified: Secondary | ICD-10-CM

## 2011-11-06 LAB — POCT URINALYSIS DIPSTICK
Bilirubin, UA: NEGATIVE
Glucose, UA: NEGATIVE
Urobilinogen, UA: 0.2

## 2011-11-06 MED ORDER — CIPROFLOXACIN HCL 500 MG PO TABS: 500.0000 mg | ORAL_TABLET | Freq: Two times a day (BID) | ORAL | Status: DC

## 2011-11-06 MED ORDER — OMEPRAZOLE 40 MG PO CPDR: 40.0000 mg | DELAYED_RELEASE_CAPSULE | Freq: Two times a day (BID) | ORAL | Status: DC

## 2011-11-06 NOTE — Progress Notes (Signed)
Addended by: Aniceto Boss A on: 11/06/2011 03:01 PM   Modules accepted: Orders

## 2011-11-06 NOTE — Progress Notes (Signed)
  Subjective:    Patient ID: Kristina Pearson, female    DOB: 1956/06/09, 56 y.o.   MRN: [id number]  HPI Here for 3 days of lower abdominal cramps and burning on urination. No nausea or fever. She was seen on 10-19-11 for this and was given Bactrim DS. Her symptoms improved for awhile but now are back. No culture was done.    Review of Systems  Constitutional: Negative.   Gastrointestinal: Positive for abdominal pain. Negative for nausea, vomiting, diarrhea, constipation, blood in stool and abdominal distention.  Genitourinary: Positive for dysuria, urgency and frequency.       Objective:   Physical Exam  Constitutional: She appears well-developed and well-nourished.  Abdominal: Soft. Bowel sounds are normal. She exhibits no distension and no mass. There is no tenderness. There is no rebound and no guarding.          Assessment & Plan:  Partially treated UTI. Try Cipro. We will culture the urine

## 2011-11-09 ENCOUNTER — Telehealth: Payer: Self-pay

## 2011-11-09 LAB — URINE CULTURE: Colony Count: >=100000

## 2011-11-09 NOTE — Telephone Encounter (Signed)
See the lab report. We need to switch antibiotics.

## 2011-11-09 NOTE — Telephone Encounter (Signed)
Triage VM:  Pt states she is having problems taking steroids.  Pt states she had a urine culture done and would like the results.  Pt states she is still having problems with her urine but it is not as bad as Friday.  Pls advise.   Called pt to get more information about symptoms but pt was not available.  Pls advise on lab results.

## 2011-11-09 NOTE — Telephone Encounter (Addendum)
Pt called req status of lab results. Pls call back. Pt said that she is having some muscle pain.

## 2011-11-10 MED ORDER — NITROFURANTOIN MONOHYD MACRO 100 MG PO CAPS: 100.0000 mg | ORAL_CAPSULE | Freq: Two times a day (BID) | ORAL | Status: AC

## 2011-11-10 NOTE — Progress Notes (Signed)
Quick Note:  I spoke with pt and sent new script e-scribe. ______ 

## 2011-11-10 NOTE — Telephone Encounter (Signed)
I did speak with pt and sent new script e-scribe

## 2011-11-24 ENCOUNTER — Other Ambulatory Visit: Payer: BC Managed Care – PPO

## 2011-11-26 ENCOUNTER — Other Ambulatory Visit (INDEPENDENT_AMBULATORY_CARE_PROVIDER_SITE_OTHER): Payer: BC Managed Care – PPO

## 2011-11-26 DIAGNOSIS — N390 Urinary tract infection, site not specified: Secondary | ICD-10-CM

## 2011-11-26 DIAGNOSIS — Z0000 Encounter for general adult medical examination without abnormal findings: Secondary | ICD-10-CM

## 2011-11-26 LAB — HEPATIC FUNCTION PANEL
AST: 21 U/L (ref 0–37)
Alkaline Phosphatase: 39 U/L (ref 39–117)
Bilirubin, Direct: 0.1 mg/dL (ref 0.0–0.3)
Total Protein: 6.5 g/dL (ref 6.0–8.3)

## 2011-11-26 LAB — POCT URINALYSIS DIPSTICK
Blood, UA: NEGATIVE
Glucose, UA: NEGATIVE
Nitrite, UA: NEGATIVE
Urobilinogen, UA: 1
pH, UA: 7

## 2011-11-26 LAB — LIPID PANEL
HDL: 77.1 mg/dL (ref >39.00)
LDL Cholesterol: 77 mg/dL (ref 0–99)
Total CHOL/HDL Ratio: 2
Triglycerides: 42 mg/dL (ref 0.0–149.0)

## 2011-11-26 LAB — BASIC METABOLIC PANEL
CO2: 28 mEq/L (ref 19–32)
Calcium: 9.1 mg/dL (ref 8.4–10.5)
Creatinine, Ser: 1 mg/dL (ref 0.4–1.2)
GFR: 64.64 mL/min (ref >60.00)
Sodium: 141 mEq/L (ref 135–145)

## 2011-11-26 LAB — CBC WITH DIFFERENTIAL/PLATELET
Basophils Relative: 0.9 % (ref 0.0–3.0)
Eosinophils Absolute: 0.1 10*3/uL (ref 0.0–0.7)
Lymphocytes Relative: 37 % (ref 12.0–46.0)
MCHC: 32.8 g/dL (ref 30.0–36.0)
Monocytes Relative: 7.9 % (ref 3.0–12.0)
Neutrophils Relative %: 51.1 % (ref 43.0–77.0)
RBC: 3.99 Mil/uL (ref 3.87–5.11)
WBC: 3.3 10*3/uL — ABNORMAL LOW (ref 4.5–10.5)

## 2011-12-01 ENCOUNTER — Ambulatory Visit (INDEPENDENT_AMBULATORY_CARE_PROVIDER_SITE_OTHER): Payer: BC Managed Care – PPO | Admitting: Family Medicine

## 2011-12-01 ENCOUNTER — Encounter: Payer: Self-pay | Admitting: Family Medicine

## 2011-12-01 ENCOUNTER — Encounter: Payer: BC Managed Care – PPO | Admitting: Family Medicine

## 2011-12-01 VITALS — BP 118/72 | HR 75 | Temp 98.3°F | Ht 66.5 in | Wt 169.0 lb

## 2011-12-01 DIAGNOSIS — F909 Attention-deficit hyperactivity disorder, unspecified type: Secondary | ICD-10-CM | POA: Insufficient documentation

## 2011-12-01 DIAGNOSIS — Z0000 Encounter for general adult medical examination without abnormal findings: Secondary | ICD-10-CM

## 2011-12-01 MED ORDER — LISDEXAMFETAMINE DIMESYLATE 20 MG PO CAPS: 20.0000 mg | ORAL_CAPSULE | Freq: Every day | ORAL | Status: DC

## 2011-12-01 NOTE — Progress Notes (Signed)
  Subjective:    Patient ID: Kristina Pearson, female    DOB: September 14, 1955, 56 y.o.   MRN: [id number]  HPI 56 yr old female for a cpx. She feels well and has no concerns. Her recent UTI has resolved.    Review of Systems  Constitutional: Negative.   HENT: Negative.   Eyes: Negative.   Respiratory: Negative.   Cardiovascular: Negative.   Gastrointestinal: Negative.   Genitourinary: Negative for dysuria, urgency, frequency, hematuria, flank pain, decreased urine volume, enuresis, difficulty urinating, pelvic pain and dyspareunia.  Musculoskeletal: Negative.   Skin: Negative.   Neurological: Negative.   Hematological: Negative.   Psychiatric/Behavioral: Negative.        Objective:   Physical Exam  Constitutional: She is oriented to person, place, and time. She appears well-developed and well-nourished. No distress.  HENT:  Head: Normocephalic and atraumatic.  Right Ear: External ear normal.  Left Ear: External ear normal.  Nose: Nose normal.  Mouth/Throat: Oropharynx is clear and moist. No oropharyngeal exudate.  Eyes: Conjunctivae and EOM are normal. Pupils are equal, round, and reactive to light. No scleral icterus.  Neck: Normal range of motion. Neck supple. No JVD present. No thyromegaly present.  Cardiovascular: Normal rate, regular rhythm, normal heart sounds and intact distal pulses.  Exam reveals no gallop and no friction rub.   No murmur heard.      EKG normal   Pulmonary/Chest: Effort normal and breath sounds normal. No respiratory distress. She has no wheezes. She has no rales. She exhibits no tenderness.  Abdominal: Soft. Bowel sounds are normal. She exhibits no distension and no mass. There is no tenderness. There is no rebound and no guarding.  Musculoskeletal: Normal range of motion. She exhibits no edema and no tenderness.  Lymphadenopathy:    She has no cervical adenopathy.  Neurological: She is alert and oriented to person, place, and time. She has normal reflexes.  No cranial nerve deficit. She exhibits normal muscle tone. Coordination normal.  Skin: Skin is warm and dry. No rash noted. No erythema.  Psychiatric: She has a normal mood and affect. Her behavior is normal. Judgment and thought content normal.          Assessment & Plan:  Well exam

## 2012-01-11 ENCOUNTER — Telehealth: Payer: Self-pay | Admitting: Family Medicine

## 2012-01-11 DIAGNOSIS — Z0000 Encounter for general adult medical examination without abnormal findings: Secondary | ICD-10-CM

## 2012-01-11 MED ORDER — LEVOTHYROXINE SODIUM 112 MCG PO TABS: 112.0000 ug | ORAL_TABLET | Freq: Every day | ORAL | Status: DC

## 2012-01-11 NOTE — Telephone Encounter (Signed)
Refill request for Synthroid and [facility]

## 2012-01-22 ENCOUNTER — Telehealth: Payer: Self-pay | Admitting: Family Medicine

## 2012-01-22 DIAGNOSIS — Z0000 Encounter for general adult medical examination without abnormal findings: Secondary | ICD-10-CM

## 2012-01-22 MED ORDER — ALPRAZOLAM 0.5 MG PO TABS: 0.5000 mg | ORAL_TABLET | Freq: Two times a day (BID) | ORAL | Status: DC | PRN

## 2012-01-22 NOTE — Telephone Encounter (Signed)
Call in #180 with one rf  

## 2012-01-22 NOTE — Telephone Encounter (Signed)
Refill request for Alprazolam 0.5 mg take 1 po bid prn 

## 2012-01-22 NOTE — Telephone Encounter (Signed)
I called in script 

## 2012-03-07 ENCOUNTER — Other Ambulatory Visit: Payer: Self-pay | Admitting: Family Medicine

## 2012-03-07 DIAGNOSIS — Z0000 Encounter for general adult medical examination without abnormal findings: Secondary | ICD-10-CM

## 2012-03-07 MED ORDER — LEVOTHYROXINE SODIUM 112 MCG PO TABS: 112.0000 ug | ORAL_TABLET | Freq: Every day | ORAL | Status: DC

## 2012-03-07 NOTE — Telephone Encounter (Signed)
Refill request for Trazodone HCL 100 mg take 2 po qhs and last here on 12/01/11. ( 90 day supply to [facility] )

## 2012-03-08 MED ORDER — TRAZODONE HCL 100 MG PO TABS: ORAL_TABLET | ORAL | Status: DC

## 2012-03-08 NOTE — Telephone Encounter (Signed)
Rx sent to pharmacy   

## 2012-03-08 NOTE — Telephone Encounter (Signed)
Call in #[phone] with 3 rf 

## 2012-05-11 ENCOUNTER — Telehealth: Payer: Self-pay | Admitting: Family Medicine

## 2012-05-11 DIAGNOSIS — Z0000 Encounter for general adult medical examination without abnormal findings: Secondary | ICD-10-CM

## 2012-05-11 MED ORDER — ALPRAZOLAM 0.5 MG PO TABS: 0.5000 mg | ORAL_TABLET | Freq: Two times a day (BID) | ORAL | Status: DC | PRN

## 2012-05-11 NOTE — Telephone Encounter (Signed)
I called in script 

## 2012-05-11 NOTE — Telephone Encounter (Signed)
Call in #180 with one rf  

## 2012-05-11 NOTE — Telephone Encounter (Signed)
Refill request for Alprazolam 0.5 mg take 1 po bid prn and last here on 12/01/11.

## 2012-05-16 ENCOUNTER — Telehealth: Payer: Self-pay | Admitting: Family Medicine

## 2012-05-16 DIAGNOSIS — Z0000 Encounter for general adult medical examination without abnormal findings: Secondary | ICD-10-CM

## 2012-05-16 MED ORDER — ALPRAZOLAM 0.5 MG PO TABS: 0.5000 mg | ORAL_TABLET | Freq: Two times a day (BID) | ORAL | Status: DC | PRN

## 2012-05-16 NOTE — Telephone Encounter (Signed)
Pt requested script for Alp[facility]. I did fax and spoke with pt.

## 2012-05-30 ENCOUNTER — Ambulatory Visit (INDEPENDENT_AMBULATORY_CARE_PROVIDER_SITE_OTHER): Payer: 59 | Admitting: Family Medicine

## 2012-05-30 DIAGNOSIS — Z23 Encounter for immunization: Secondary | ICD-10-CM

## 2012-06-16 ENCOUNTER — Other Ambulatory Visit: Payer: Self-pay | Admitting: Family Medicine

## 2012-06-16 NOTE — Telephone Encounter (Signed)
Pt needs new rx  vyvanse 20 mg °

## 2012-06-17 MED ORDER — LISDEXAMFETAMINE DIMESYLATE 20 MG PO CAPS: 20.0000 mg | ORAL_CAPSULE | Freq: Every day | ORAL | Status: DC

## 2012-06-17 NOTE — Telephone Encounter (Signed)
done

## 2012-06-17 NOTE — Telephone Encounter (Signed)
Script is ready for pick up and I spoke with pt.  

## 2012-09-07 ENCOUNTER — Other Ambulatory Visit: Payer: Self-pay | Admitting: Obstetrics & Gynecology

## 2012-09-07 ENCOUNTER — Other Ambulatory Visit: Payer: Self-pay

## 2012-09-07 ENCOUNTER — Other Ambulatory Visit: Payer: Self-pay | Admitting: *Deleted

## 2012-09-07 ENCOUNTER — Telehealth: Payer: Self-pay | Admitting: Obstetrics & Gynecology

## 2012-09-07 NOTE — Progress Notes (Signed)
Bone density ordered per Dr.Miller at [facility]. Fannie Knee 09/07/2012

## 2012-09-07 NOTE — Telephone Encounter (Signed)
BMD ORDER [facility] FA[facility] [phone]

## 2012-10-20 ENCOUNTER — Ambulatory Visit: Payer: 59

## 2012-10-20 ENCOUNTER — Inpatient Hospital Stay: Admission: RE | Admit: 2012-10-20 | Payer: 59 | Source: Ambulatory Visit

## 2012-11-08 ENCOUNTER — Telehealth: Payer: Self-pay | Admitting: Family Medicine

## 2012-11-08 DIAGNOSIS — Z0000 Encounter for general adult medical examination without abnormal findings: Secondary | ICD-10-CM

## 2012-11-08 NOTE — Telephone Encounter (Signed)
Refill request for Alprazolam 0.5 mg take 1 po bid prn and send to [facility] for a 90 day supply.

## 2012-11-09 MED ORDER — ALPRAZOLAM 0.5 MG PO TABS: 0.5000 mg | ORAL_TABLET | Freq: Two times a day (BID) | ORAL | Status: DC | PRN

## 2012-11-09 NOTE — Telephone Encounter (Signed)
done

## 2012-11-09 NOTE — Telephone Encounter (Signed)
Script was faxed to below number.  

## 2012-11-16 ENCOUNTER — Telehealth: Payer: Self-pay | Admitting: Family Medicine

## 2012-11-16 MED ORDER — LISDEXAMFETAMINE DIMESYLATE 20 MG PO CAPS: 20.0000 mg | ORAL_CAPSULE | Freq: Every day | ORAL | Status: DC

## 2012-11-16 NOTE — Telephone Encounter (Signed)
Pt needs new rx  vyvanse 20 mg °

## 2012-11-16 NOTE — Telephone Encounter (Signed)
Script is ready for pick up and I left a voice message.  

## 2012-11-16 NOTE — Telephone Encounter (Signed)
done

## 2012-12-05 ENCOUNTER — Ambulatory Visit (INDEPENDENT_AMBULATORY_CARE_PROVIDER_SITE_OTHER): Payer: 59 | Admitting: Family Medicine

## 2012-12-05 ENCOUNTER — Encounter: Payer: Self-pay | Admitting: Family Medicine

## 2012-12-05 VITALS — BP 114/64 | HR 77 | Temp 98.3°F | Ht 66.5 in | Wt 154.0 lb

## 2012-12-05 DIAGNOSIS — Z0000 Encounter for general adult medical examination without abnormal findings: Secondary | ICD-10-CM

## 2012-12-05 DIAGNOSIS — M545 Low back pain, unspecified: Secondary | ICD-10-CM

## 2012-12-05 LAB — SEDIMENTATION RATE: Sed Rate: 10 mm/hr (ref 0–22)

## 2012-12-05 LAB — C-REACTIVE PROTEIN: CRP: <0.5 mg/dL (ref 0.5–20.0)

## 2012-12-05 NOTE — Progress Notes (Signed)
  Subjective:    Patient ID: Kristina Pearson, female    DOB: [birth year], 57 y.o.   MRN: [id number]  HPI 57 yr old female for a cpx. She feels well in general although her low back pain still bothers her.    Review of Systems  Constitutional: Negative.   HENT: Negative.   Eyes: Negative.   Respiratory: Negative.   Cardiovascular: Negative.   Gastrointestinal: Negative.   Genitourinary: Negative for dysuria, urgency, frequency, hematuria, flank pain, decreased urine volume, enuresis, difficulty urinating, pelvic pain and dyspareunia.  Musculoskeletal: Negative.   Skin: Negative.   Neurological: Negative.   Psychiatric/Behavioral: Negative.        Objective:   Physical Exam  Constitutional: She is oriented to person, place, and time. She appears well-developed and well-nourished. No distress.  HENT:  Head: Normocephalic and atraumatic.  Right Ear: External ear normal.  Left Ear: External ear normal.  Nose: Nose normal.  Mouth/Throat: Oropharynx is clear and moist. No oropharyngeal exudate.  Eyes: Conjunctivae and EOM are normal. Pupils are equal, round, and reactive to light. No scleral icterus.  Neck: Normal range of motion. Neck supple. No JVD present. No thyromegaly present.  Cardiovascular: Normal rate, regular rhythm, normal heart sounds and intact distal pulses.  Exam reveals no gallop and no friction rub.   No murmur heard. Pulmonary/Chest: Effort normal and breath sounds normal. No respiratory distress. She has no wheezes. She has no rales. She exhibits no tenderness.  Abdominal: Soft. Bowel sounds are normal. She exhibits no distension and no mass. There is no tenderness. There is no rebound and no guarding.  Musculoskeletal: Normal range of motion. She exhibits no edema and no tenderness.  Lymphadenopathy:    She has no cervical adenopathy.  Neurological: She is alert and oriented to person, place, and time. She has normal reflexes. No cranial nerve deficit. She exhibits  normal muscle tone. Coordination normal.  Skin: Skin is warm and dry. No rash noted. No erythema.  Psychiatric: She has a normal mood and affect. Her behavior is normal. Judgment and thought content normal.          Assessment & Plan:  Well exam.

## 2012-12-06 NOTE — Progress Notes (Signed)
Quick Note:  I left voice message with results. ______ 

## 2012-12-29 ENCOUNTER — Ambulatory Visit (INDEPENDENT_AMBULATORY_CARE_PROVIDER_SITE_OTHER): Payer: 59 | Admitting: Family Medicine

## 2012-12-29 ENCOUNTER — Telehealth: Payer: Self-pay | Admitting: Family Medicine

## 2012-12-29 ENCOUNTER — Encounter: Payer: Self-pay | Admitting: Family Medicine

## 2012-12-29 VITALS — BP 118/68 | HR 82 | Temp 98.7°F | Ht 62.0 in | Wt 152.0 lb

## 2012-12-29 DIAGNOSIS — G8929 Other chronic pain: Secondary | ICD-10-CM

## 2012-12-29 DIAGNOSIS — M545 Low back pain: Secondary | ICD-10-CM

## 2012-12-29 DIAGNOSIS — M791 Myalgia, unspecified site: Secondary | ICD-10-CM

## 2012-12-29 DIAGNOSIS — IMO0001 Reserved for inherently not codable concepts without codable children: Secondary | ICD-10-CM

## 2012-12-29 MED ORDER — HYDROCODONE-ACETAMINOPHEN 5-325 MG PO TABS: ORAL_TABLET | ORAL | Status: DC

## 2012-12-29 NOTE — Telephone Encounter (Signed)
Patient Information:  Caller Name: Ercell  Phone: [phone]  Patient: Kristina Pearson  Gender: Female  DOB: 1956-03-25  Age: 57 Years  PCP: [facility])  Office Follow Up:  Does the office need to follow up with this patient?: Yes  Instructions For The Office: PT REQUESTING rX FOR PAIN MEDICATION STATING CELEBREX IS NOT WORKING. PLEASE F/U WITH PT IF APPT IS RECOMMENDED, THANK YOU.   Symptoms  Reason For Call & Symptoms: Pt states she is having pain that Celebrex not helping.  Pt requesting Rx for Hydrodocone.  Reviewed Health History In EMR: Yes  Reviewed Medications In EMR: Yes  Reviewed Allergies In EMR: Yes  Reviewed Surgeries / Procedures: Yes  Date of Onset of Symptoms: 12/28/2012  Guideline(s) Used:  Neck Injury  Neck Pain or Stiffness  Disposition Per Guideline:   Go to Office Now  Reason For Disposition Reached:   Severe pain (e.g., excruciating, unable to do any normal activities)  Advice Given:  Call Back If:  Numbness or weakness occurs  You become worse.  Patient Refused Recommendation:  Patient Refused Appt, Patient Requests Appt At Later Date  Pt requesting Rx for pain medication.

## 2012-12-29 NOTE — Progress Notes (Signed)
  Subjective:    Patient ID: Kristina Pearson, female    DOB: 1956-02-17, 57 y.o.   MRN: [id number]  HPI Acute visit. Patient seen with worsening pain very poorly localized including right upper back right lower back and right hip. She had Worker's Comp. injury about 2 years ago assessment chronic low back pain since then. She takes Celebrex and Robaxin regularly. Pain has very poorly localized as above. Acute worsening 1 day ago. No injury. No change of activity. Pain locations include right neck, right upper back, right lower back, right hip. She denies any acute worsening of weakness or numbness.  She has rarely taking hydrocodone the past which helped but has not had a prescription for several months.  She did pull a very small tick off about 8 days ago but no rash. No significant headaches. No fevers or chills. No recent travels to area endemic for Lyme disease  Past Medical History  Diagnosis Date  . Depression   . Allergy   . Hypothyroid   . Sialoadenitis   . GERD (gastroesophageal reflux disease)   . Headache(784.0)   . Arthritis     osteoarthritrs  . ADHD (attention deficit hyperactivity disorder)    Past Surgical History  Procedure Laterality Date  . Cholecystectomy  1996    reports that she quit smoking about 29 years ago. She has never used smokeless tobacco. She reports that she drinks about 1.0 ounces of alcohol per week. She reports that she does not use illicit drugs. family history includes Arthritis in her mother; Diabetes in her mother; and Heart attack in her father. No Known Allergies     Review of Systems  Constitutional: Positive for fatigue. Negative for fever and chills.  Respiratory: Negative for shortness of breath.   Cardiovascular: Negative for chest pain.  Musculoskeletal: Positive for myalgias, back pain and arthralgias.  Skin: Negative for rash.  Neurological: Negative for headaches.       Objective:   Physical Exam  Constitutional: She  appears well-developed and well-nourished.  Cardiovascular: Normal rate and regular rhythm.   Pulmonary/Chest: Effort normal and breath sounds normal. No respiratory distress. She has no wheezes. She has no rales.  Musculoskeletal:  Pain with extreme extension or flexion of neck. She has several areas of nonspecific muscular tenderness mostly right trapezius right paracervical muscles. A lesser extent left-sided. Full range of motion right shoulder and right hip  Neurological: She is alert. She has normal reflexes.  Full-strength throughout lower and upper extremities  Skin: No rash noted.          Assessment & Plan:  Chronic low back pain with exacerbation. She has very poorly localized pain involving multiple areas as above. Limited hydrocodone 5 mg #30 with no refills. Schedule followup with primary. She has not had any suggestion of acute lyme disease and tick bite is only 8 days ago and no recent travels to endemic area.. Clinically, this does not sound like polymyalgia rheumatica with mostly localized symptoms to the right side

## 2012-12-29 NOTE — Telephone Encounter (Signed)
I spoke with pt and she is going to schedule a office visit to come in and see another provider.

## 2013-01-10 ENCOUNTER — Ambulatory Visit: Payer: 59 | Admitting: Family Medicine

## 2013-01-15 ENCOUNTER — Other Ambulatory Visit: Payer: Self-pay | Admitting: Family Medicine

## 2013-01-16 ENCOUNTER — Encounter: Payer: Self-pay | Admitting: Family Medicine

## 2013-01-16 ENCOUNTER — Other Ambulatory Visit: Payer: Self-pay | Admitting: Family Medicine

## 2013-01-16 ENCOUNTER — Ambulatory Visit (INDEPENDENT_AMBULATORY_CARE_PROVIDER_SITE_OTHER): Payer: 59 | Admitting: Family Medicine

## 2013-01-16 VITALS — BP 110/76 | HR 82 | Temp 98.4°F | Wt 150.0 lb

## 2013-01-16 DIAGNOSIS — M542 Cervicalgia: Secondary | ICD-10-CM

## 2013-01-16 MED ORDER — OMEPRAZOLE 40 MG PO CPDR: 40.0000 mg | DELAYED_RELEASE_CAPSULE | Freq: Two times a day (BID) | ORAL | Status: DC | PRN

## 2013-01-16 NOTE — Progress Notes (Signed)
  Subjective:    Patient ID: Kristina Pearson, female    DOB: 02-Jun-1956, 57 y.o.   MRN: [id number]  HPI Here for several weeks of pain and stiffness in the right side of the neck. Sometimes it spreads up to the right side of the back of the head or to the right trapezeus. Using Vicodin at night and heat.    Review of Systems  Constitutional: Negative.   HENT: Positive for neck pain and neck stiffness.        Objective:   Physical Exam  Constitutional: Kristina Pearson appears well-developed and well-nourished. No distress.  Musculoskeletal:  Mildly tender in the right neck with some spasm. full ROM           Assessment & Plan:  Continue with meds and heat. Kristina Pearson may try massage. Recheck prn

## 2013-01-31 ENCOUNTER — Other Ambulatory Visit: Payer: Self-pay | Admitting: Family Medicine

## 2013-02-01 NOTE — Telephone Encounter (Signed)
Last refill 12/29/12 #30 no refill

## 2013-02-01 NOTE — Telephone Encounter (Signed)
Refill once only. 

## 2013-04-04 ENCOUNTER — Ambulatory Visit: Payer: 59

## 2013-04-04 ENCOUNTER — Other Ambulatory Visit: Payer: 59

## 2013-04-07 ENCOUNTER — Ambulatory Visit (INDEPENDENT_AMBULATORY_CARE_PROVIDER_SITE_OTHER): Payer: 59

## 2013-04-07 DIAGNOSIS — Z23 Encounter for immunization: Secondary | ICD-10-CM

## 2013-04-17 ENCOUNTER — Telehealth: Payer: Self-pay | Admitting: Family Medicine

## 2013-04-17 DIAGNOSIS — Z0000 Encounter for general adult medical examination without abnormal findings: Secondary | ICD-10-CM

## 2013-04-17 NOTE — Telephone Encounter (Signed)
Refill request for Alprazolam 0.5 mg take 1 po bid prn and send to [facility].  

## 2013-04-17 NOTE — Telephone Encounter (Signed)
Okay to send for #180 and one rf

## 2013-04-18 MED ORDER — ALPRAZOLAM 0.5 MG PO TABS: 0.5000 mg | ORAL_TABLET | Freq: Two times a day (BID) | ORAL | Status: DC | PRN

## 2013-04-18 NOTE — Telephone Encounter (Signed)
I faxed script 

## 2013-04-25 ENCOUNTER — Encounter: Payer: Self-pay | Admitting: Family Medicine

## 2013-04-25 ENCOUNTER — Ambulatory Visit (INDEPENDENT_AMBULATORY_CARE_PROVIDER_SITE_OTHER): Payer: 59 | Admitting: Family Medicine

## 2013-04-25 VITALS — BP 104/64 | HR 92 | Temp 99.0°F | Wt 143.0 lb

## 2013-04-25 DIAGNOSIS — G8929 Other chronic pain: Secondary | ICD-10-CM

## 2013-04-25 DIAGNOSIS — M542 Cervicalgia: Secondary | ICD-10-CM

## 2013-04-25 MED ORDER — CELECOXIB 200 MG PO CAPS: 200.0000 mg | ORAL_CAPSULE | Freq: Every day | ORAL | Status: DC

## 2013-04-25 MED ORDER — METHOCARBAMOL 500 MG PO TABS: 500.0000 mg | ORAL_TABLET | Freq: Four times a day (QID) | ORAL | Status: DC | PRN

## 2013-04-25 NOTE — Progress Notes (Signed)
  Subjective:    Patient ID: Kristina Pearson, female    DOB: Aug 15, 1955, 57 y.o.   MRN: [id number]  HPI Here for chronic neck pain. She is taking Celebrex 100 mg a day since that is what the Workers Comp doctors gave her. This does not work well and she wants to go up to 200 mg a day. Also she has run out of Robaxin which helped her from locking up in the neck. Using heat.    Review of Systems  Constitutional: Negative.   Musculoskeletal: Positive for neck pain and neck stiffness.       Objective:   Physical Exam  Constitutional: She appears well-developed and well-nourished.  Musculoskeletal: Normal range of motion. She exhibits no edema and no tenderness.          Assessment & Plan:  Use Robaxin QID prn and Celebrex 200 mg daily.

## 2013-05-03 ENCOUNTER — Telehealth: Payer: Self-pay | Admitting: Family Medicine

## 2013-05-03 MED ORDER — LISDEXAMFETAMINE DIMESYLATE 20 MG PO CAPS: 20.0000 mg | ORAL_CAPSULE | Freq: Every day | ORAL | Status: DC

## 2013-05-03 NOTE — Telephone Encounter (Signed)
Pt needs her lisdexamfetamine (VYVANSE) 20 MG capsule rx recertified per her insurance. Please call when it is ready for pick up.

## 2013-05-03 NOTE — Telephone Encounter (Signed)
Done for one month  ?

## 2013-05-04 ENCOUNTER — Telehealth: Payer: Self-pay | Admitting: Family Medicine

## 2013-05-04 NOTE — Telephone Encounter (Signed)
Pt called stating she missed a call from the office, she thinks it was you.  Pt would like a call back and to know when she can come to pick-up her script forlisdexamfetamine (VYVANSE) 20 MG capsule.

## 2013-05-04 NOTE — Telephone Encounter (Signed)
Script is ready for pick up and I spoke with pt. She said that pharmacy would send over a request if needed for insurance to cover this.

## 2013-05-04 NOTE — Telephone Encounter (Signed)
Script is ready for pick up and I spoke with pt.  

## 2013-05-15 ENCOUNTER — Telehealth: Payer: Self-pay | Admitting: Family Medicine

## 2013-05-15 NOTE — Telephone Encounter (Signed)
Patient Information:  Caller Name: Makeila  Phone: [phone]  Patient: Kristina Pearson  Gender: Female  DOB: 05-06-1956  Age: 57 Years  PCP: [facility] Crane [facility])  Office Follow Up:  Does the office need to follow up with this patient?: Yes  Instructions For The Office: Can patient have preventative Tamiflu?  RN Note:  Can patient have prevenative Tamiflu due to daughter + and coming home from college. [facility] [phone]  Symptoms  Reason For Call & Symptoms: Caller states that child is coming home from college today and positive with the flu as of 11.24.14.  Caller most concerned because her mother lives with her and is elderly and on Prednisone.  She has contacted her MD for Tamiflu.  Requesting Tamiflu for herself mostly to help prevent her from getting it and giving it to her mother.  Reviewed Health History In EMR: N/A  Reviewed Medications In EMR: N/A  Reviewed Allergies In EMR: N/A  Reviewed Surgeries / Procedures: N/A  Date of Onset of Symptoms: 05/15/2013  Guideline(s) Used:  No Protocol Available - Information Only  Disposition Per Guideline:   Discuss with PCP and Callback by Nurse Today  Reason For Disposition Reached:   Nursing judgment  Advice Given:  Call Back If:  You become worse.  Patient Will Follow Care Advice:  YES

## 2013-05-15 NOTE — Telephone Encounter (Signed)
Per Dr. Clent Ridges, this is not something he would prescribe over the phone. Kristina Pearson is not having any symptoms, just wanted to ask about using it as a preventative measure. I spoke with Kristina Pearson.

## 2013-06-22 DIAGNOSIS — Z923 Personal history of irradiation: Secondary | ICD-10-CM

## 2013-06-22 HISTORY — DX: Personal history of irradiation: Z92.3

## 2013-06-26 ENCOUNTER — Telehealth: Payer: Self-pay | Admitting: Family Medicine

## 2013-06-26 NOTE — Telephone Encounter (Signed)
Patient called requesting a refill on lisdexamfetamine (VYVANSE) 20 MG capsule Please advise

## 2013-06-27 MED ORDER — LISDEXAMFETAMINE DIMESYLATE 20 MG PO CAPS: 20.0000 mg | ORAL_CAPSULE | Freq: Every day | ORAL | Status: DC

## 2013-06-27 NOTE — Telephone Encounter (Signed)
done

## 2013-06-27 NOTE — Telephone Encounter (Signed)
Pt is out of med and states unawre of the 3 day refill  policy. Will remember next time to call sooner!!

## 2013-06-28 NOTE — Telephone Encounter (Signed)
Called and spoke with pt and pt is aware rx ready for pick up.  

## 2013-07-07 ENCOUNTER — Telehealth: Payer: Self-pay | Admitting: Obstetrics & Gynecology

## 2013-07-07 NOTE — Telephone Encounter (Signed)
Pt wants to know if she can have an order for a bone density. She states her mother was just diagnosed with osteoporosis. She also states she had a fracture in her spine recently and she is really concerned.

## 2013-07-07 NOTE — Telephone Encounter (Signed)
Pt called back and would like to ask if the previous message could be disregarded. She just realized a bone density order was already on file for her.

## 2013-07-07 NOTE — Telephone Encounter (Signed)
[facility]  Scheduled for 07/26/13 at 130. Will close encounter.

## 2013-07-15 ENCOUNTER — Other Ambulatory Visit: Payer: Self-pay | Admitting: Family Medicine

## 2013-07-17 NOTE — Telephone Encounter (Signed)
Pt is waiting on mailorder. Pt is out of med

## 2013-07-23 HISTORY — PX: BREAST BIOPSY: SHX20

## 2013-07-26 ENCOUNTER — Ambulatory Visit
Admission: RE | Admit: 2013-07-26 | Discharge: 2013-07-26 | Disposition: A | Discharge status: Home / Self Care | Payer: 59 | Source: Ambulatory Visit | Attending: Obstetrics & Gynecology | Admitting: Obstetrics & Gynecology

## 2013-07-26 ENCOUNTER — Ambulatory Visit
Admission: RE | Admit: 2013-07-26 | Discharge: 2013-07-26 | Disposition: A | Discharge status: Home / Self Care | Payer: 59 | Source: Ambulatory Visit

## 2013-07-26 ENCOUNTER — Telehealth: Payer: Self-pay | Admitting: Family Medicine

## 2013-07-26 DIAGNOSIS — Z780 Asymptomatic menopausal state: Secondary | ICD-10-CM

## 2013-07-26 DIAGNOSIS — Z1231 Encounter for screening mammogram for malignant neoplasm of breast: Secondary | ICD-10-CM

## 2013-07-26 NOTE — Telephone Encounter (Signed)
Pt needs new rx hydrocodone °

## 2013-07-27 MED ORDER — HYDROCODONE-ACETAMINOPHEN 5-325 MG PO TABS: 1.0000 | ORAL_TABLET | Freq: Four times a day (QID) | ORAL | Status: DC | PRN

## 2013-07-27 NOTE — Telephone Encounter (Signed)
Done but she needs testing and a contract  

## 2013-07-28 NOTE — Telephone Encounter (Signed)
Script is ready for pick up, contract printed and I spoke with pt. 

## 2013-07-31 ENCOUNTER — Other Ambulatory Visit: Payer: Self-pay | Admitting: Obstetrics & Gynecology

## 2013-07-31 DIAGNOSIS — R928 Other abnormal and inconclusive findings on diagnostic imaging of breast: Secondary | ICD-10-CM

## 2013-08-07 ENCOUNTER — Telehealth: Payer: Self-pay | Admitting: Family Medicine

## 2013-08-07 NOTE — Telephone Encounter (Signed)
Pt needs new rx yvanse 20 mg

## 2013-08-09 ENCOUNTER — Other Ambulatory Visit: Payer: Self-pay | Admitting: Obstetrics & Gynecology

## 2013-08-09 ENCOUNTER — Ambulatory Visit
Admission: RE | Admit: 2013-08-09 | Discharge: 2013-08-09 | Disposition: A | Discharge status: Home / Self Care | Payer: 59 | Source: Ambulatory Visit | Attending: Obstetrics & Gynecology | Admitting: Obstetrics & Gynecology

## 2013-08-09 DIAGNOSIS — R928 Other abnormal and inconclusive findings on diagnostic imaging of breast: Secondary | ICD-10-CM

## 2013-08-09 DIAGNOSIS — R921 Mammographic calcification found on diagnostic imaging of breast: Secondary | ICD-10-CM

## 2013-08-09 NOTE — Telephone Encounter (Signed)
I spoke with pt and she will look for script.

## 2013-08-09 NOTE — Telephone Encounter (Signed)
She already has refills until 09-25-13

## 2013-08-14 ENCOUNTER — Telehealth: Payer: Self-pay | Admitting: Obstetrics & Gynecology

## 2013-08-14 NOTE — Telephone Encounter (Signed)
Patient said [facility] telling her to make an appt with her physician after her procedure which is tomorrow. They told her they would send the results after procedure.

## 2013-08-14 NOTE — Telephone Encounter (Signed)
Patient requested appointment to discuss biopsy results with Dr. Sabra Heck. Patient feels that the [facility] "is just telling me what to do and not really explaining." Scheduled appointment to allow for pathology results to come in. Advised that the [facility] would discuss results with as well. Would like to discuss with Dr. Sabra Heck what it means for her future imaging needs.  Routing to provider for final review. Patient agreeable to disposition. Will close encounter

## 2013-08-15 ENCOUNTER — Ambulatory Visit
Admission: RE | Admit: 2013-08-15 | Discharge: 2013-08-15 | Disposition: A | Discharge status: Home / Self Care | Payer: 59 | Source: Ambulatory Visit | Attending: Obstetrics & Gynecology | Admitting: Obstetrics & Gynecology

## 2013-08-15 DIAGNOSIS — R928 Other abnormal and inconclusive findings on diagnostic imaging of breast: Secondary | ICD-10-CM

## 2013-08-15 DIAGNOSIS — R921 Mammographic calcification found on diagnostic imaging of breast: Secondary | ICD-10-CM

## 2013-08-15 DIAGNOSIS — C50919 Malignant neoplasm of unspecified site of unspecified female breast: Secondary | ICD-10-CM

## 2013-08-15 HISTORY — DX: Malignant neoplasm of unspecified site of unspecified female breast: C50.919

## 2013-08-16 ENCOUNTER — Other Ambulatory Visit: Payer: Self-pay | Admitting: Obstetrics & Gynecology

## 2013-08-16 DIAGNOSIS — D0510 Intraductal carcinoma in situ of unspecified breast: Secondary | ICD-10-CM

## 2013-08-18 ENCOUNTER — Telehealth: Payer: Self-pay | Admitting: *Deleted

## 2013-08-18 DIAGNOSIS — C50411 Malignant neoplasm of upper-outer quadrant of right female breast: Secondary | ICD-10-CM | POA: Insufficient documentation

## 2013-08-18 NOTE — Telephone Encounter (Signed)
Confirmed [facility] for 08/23/13 at 12N .  Instructions and contact information given.

## 2013-08-20 HISTORY — PX: BREAST LUMPECTOMY: SHX2

## 2013-08-21 ENCOUNTER — Ambulatory Visit
Admission: RE | Admit: 2013-08-21 | Discharge: 2013-08-21 | Disposition: A | Discharge status: Home / Self Care | Payer: 59 | Source: Ambulatory Visit | Attending: Obstetrics & Gynecology | Admitting: Obstetrics & Gynecology

## 2013-08-21 ENCOUNTER — Ambulatory Visit: Payer: Self-pay | Admitting: Obstetrics & Gynecology

## 2013-08-21 DIAGNOSIS — D0510 Intraductal carcinoma in situ of unspecified breast: Secondary | ICD-10-CM

## 2013-08-21 MED ORDER — GADOBENATE DIMEGLUMINE 529 MG/ML IV SOLN
13.0000 mL | Freq: Once | INTRAVENOUS | Status: AC | PRN
  Administered 2013-08-21: 13 mL via INTRAVENOUS

## 2013-08-22 ENCOUNTER — Encounter: Payer: Self-pay | Admitting: Family Medicine

## 2013-08-23 ENCOUNTER — Other Ambulatory Visit (HOSPITAL_BASED_OUTPATIENT_CLINIC_OR_DEPARTMENT_OTHER): Payer: 59

## 2013-08-23 ENCOUNTER — Encounter (INDEPENDENT_AMBULATORY_CARE_PROVIDER_SITE_OTHER): Payer: Self-pay | Admitting: General Surgery

## 2013-08-23 ENCOUNTER — Ambulatory Visit (HOSPITAL_BASED_OUTPATIENT_CLINIC_OR_DEPARTMENT_OTHER): Payer: 59 | Admitting: General Surgery

## 2013-08-23 ENCOUNTER — Ambulatory Visit: Payer: 59 | Admitting: Physical Therapy

## 2013-08-23 ENCOUNTER — Ambulatory Visit (HOSPITAL_BASED_OUTPATIENT_CLINIC_OR_DEPARTMENT_OTHER): Payer: 59 | Admitting: Oncology

## 2013-08-23 ENCOUNTER — Encounter: Payer: Self-pay | Admitting: Oncology

## 2013-08-23 ENCOUNTER — Ambulatory Visit
Admission: RE | Admit: 2013-08-23 | Discharge: 2013-08-23 | Disposition: A | Discharge status: Home / Self Care | Payer: 59 | Source: Ambulatory Visit | Attending: Radiation Oncology | Admitting: Radiation Oncology

## 2013-08-23 ENCOUNTER — Ambulatory Visit (HOSPITAL_BASED_OUTPATIENT_CLINIC_OR_DEPARTMENT_OTHER): Payer: 59

## 2013-08-23 VITALS — BP 112/75 | HR 58 | Temp 98.3°F | Resp 18 | Ht 66.0 in | Wt 143.6 lb

## 2013-08-23 DIAGNOSIS — C50419 Malignant neoplasm of upper-outer quadrant of unspecified female breast: Secondary | ICD-10-CM

## 2013-08-23 DIAGNOSIS — C50411 Malignant neoplasm of upper-outer quadrant of right female breast: Secondary | ICD-10-CM

## 2013-08-23 DIAGNOSIS — D0590 Unspecified type of carcinoma in situ of unspecified breast: Secondary | ICD-10-CM

## 2013-08-23 DIAGNOSIS — Z170 Estrogen receptor positive status [ER+]: Secondary | ICD-10-CM

## 2013-08-23 LAB — CBC WITH DIFFERENTIAL/PLATELET
BASO%: 1.1 % (ref 0.0–2.0)
Basophils Absolute: 0 10*3/uL (ref 0.0–0.1)
EOS%: 3.5 % (ref 0.0–7.0)
Eosinophils Absolute: 0.1 10*3/uL (ref 0.0–0.5)
HCT: 37.7 % (ref 34.8–46.6)
HGB: 12.5 g/dL (ref 11.6–15.9)
LYMPH%: 31.5 % (ref 14.0–49.7)
MCH: 32 pg (ref 25.1–34.0)
MCHC: 33.1 g/dL (ref 31.5–36.0)
MCV: 96.6 fL (ref 79.5–101.0)
MONO#: 0.3 10*3/uL (ref 0.1–0.9)
MONO%: 8 % (ref 0.0–14.0)
NEUT%: 55.9 % (ref 38.4–76.8)
NEUTROS ABS: 2.3 10*3/uL (ref 1.5–6.5)
Platelets: 208 10*3/uL (ref 145–400)
RBC: 3.9 10*6/uL (ref 3.70–5.45)
RDW: 12.5 % (ref 11.2–14.5)
WBC: 4.2 10*3/uL (ref 3.9–10.3)
lymph#: 1.3 10*3/uL (ref 0.9–3.3)

## 2013-08-23 LAB — COMPREHENSIVE METABOLIC PANEL (CC13)
ALK PHOS: 46 U/L (ref 40–150)
ALT: 10 U/L (ref 0–55)
AST: 19 U/L (ref 5–34)
Albumin: 4.1 g/dL (ref 3.5–5.0)
Anion Gap: 7 mEq/L (ref 3–11)
BUN: 8.7 mg/dL (ref 7.0–26.0)
CO2: 29 meq/L (ref 22–29)
Calcium: 9.8 mg/dL (ref 8.4–10.4)
Chloride: 103 mEq/L (ref 98–109)
Creatinine: 0.9 mg/dL (ref 0.6–1.1)
GLUCOSE: 103 mg/dL (ref 70–140)
POTASSIUM: 4.6 meq/L (ref 3.5–5.1)
Sodium: 139 mEq/L (ref 136–145)
Total Bilirubin: 0.45 mg/dL (ref 0.20–1.20)
Total Protein: 6.9 g/dL (ref 6.4–8.3)

## 2013-08-23 NOTE — Progress Notes (Signed)
Checked in new pt with no financial concerns. °

## 2013-08-23 NOTE — Patient Instructions (Signed)
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  It is sensitive to estrogen and progesterone.  We have talked about your surgical options, and we have decided to proceed with conservative surgery, known as a lumpectomy or a partial mastectomy.  There does not appear to be any indication for lymph node biopsy at this time.  Dr. Darrel Hoover office will call you tomorrow to arrange and schedule the surgery.     Lumpectomy A lumpectomy is a form of "breast conserving" or "breast preservation" surgery. It may also be referred to as a partial mastectomy. During a lumpectomy, the portion of the breast that contains the cancerous tumor or breast mass (the lump) is removed. Some normal tissue around the lump may also be removed to make sure all the tumor has been removed. This surgery should take 40 minutes or less. LET [facility] CARE PROVIDER KNOW ABOUT:  Any allergies you have.  All medicines you are taking, including vitamins, herbs, eye drops, creams, and over-the-counter medicines.  Previous problems you or members of your family have had with the use of anesthetics.  Any blood disorders you have.  Previous surgeries you have had.  Medical conditions you have. RISKS AND COMPLICATIONS Generally, this is a safe procedure. However, as with any procedure, complications can occur. Possible complications include:  Bleeding.  Infection.  Pain.  Temporary swelling.  Change in the shape of the breast, particularly if a large portion is removed. BEFORE THE PROCEDURE  Ask your health care provider about changing or stopping your regular medicines.  Do not eat or drink anything for 7 8 hours before the surgery or as directed by your health care provider. Ask your health care provider if you can take a sip of water with any approved medicines.  On the day of surgery,  your healthcare provider will use a mammogram or ultrasound to locate and mark the tumor in your breast. These markings on your breast will show where the cut (incision) will be made. PROCEDURE   An IV tube will be put into one of your veins.  You may be given medicine to help you relax before the surgery (sedative). You will be given one of the following:  A medicine that numbs the area (local anesthesia).  A medicine that makes you go to sleep (general anesthesia).  Your health care provider will use a kind of electric scalpel that uses heat to minimize bleeding (electrocautery knife).  A curved incision (like a smile or frown) that follows the natural curve of your breast is made, to allow for minimal scarring and better healing.  The tumor will be removed with some of the surrounding tissue. This will be sent to the lab for analysis. Your health care provider may also remove your lymph nodes at this time if needed.  Sometimes, but not always, a rubber tube called a drain will be surgically inserted into your breast area or armpit to collect excess fluid that may accumulate in the space where the tumor was. This drain is connected to a plastic bulb on the outside of your body. This drain creates suction to help remove the fluid.  The incisions will be closed with stitches (sutures).  A bandage may be placed over the incisions. AFTER THE PROCEDURE  You will be taken to the recovery area.  You will be given medicine for pain.  A small  rubber drain may be placed in the breast for 2 3 days to prevent a collection of blood (hematoma) from developing in the breast. You will be given instructions on caring for the drain before you go home.  A pressure bandage (dressing) will be applied for 1 2 days to prevent bleeding. Ask your health care provider how to care for your bandage at home. Document Released: 07/20/2006 Document Revised: 02/08/2013 Document Reviewed: 11/11/2012 [facility]  Patient Information 2014 [employer].

## 2013-08-23 NOTE — Progress Notes (Signed)
Patient ID: Kristina Pearson, female   DOB: June 25, 1955, 58 y.o.   MRN: [id number]  No chief complaint on file.   HPI Kristina Pearson is a 58 y.o. female.  She is referred by Dr. 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 Dr. Gordan Payment  is her primary care physician. She is being evaluated in the [facility] today by Dr. Marcy Panning, Arloa Koh, and me.   She has no prior history of breast problems she does have some intermittent spontaneous pain. No nipple discharge. She gets routine regular mammograms. Recent mammograms show a focal area of clustered calcifications in the right breast, upper outer quadrant, 1 cm in dimension. Image and biopsy shows grade 1 adenocarcinoma in situ and some atypical lobular hyperplasia. ER-positive 100%, PR-positive 75%. MRI shows solitary biopsy changes only. She is here today with her husband.  She has minimal comorbidities. Hypothyroidism, GERD, headaches, ADHD, minimal depression.  History is positive for 2 paternal second cousins with breast cancer. One died of metastatic disease in her 55s and one is living. No other history of breast or ovarian cancer in the family.  She is married. 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 HPI  Past Medical History  Diagnosis Date  . Depression   . Allergy   . Hypothyroid   . Sialoadenitis   . GERD (gastroesophageal reflux disease)   . Headache(784.0)   . Arthritis     osteoarthritrs  . ADHD (attention deficit hyperactivity disorder)     Past Surgical History  Procedure Laterality Date  . Cholecystectomy  1996    Family History  Problem Relation Age of Onset  . Arthritis Mother   . Diabetes Mother   . Heart attack Father   . Colon cancer Maternal Uncle   . Leukemia Maternal Uncle   . Colon cancer Cousin   . Breast cancer Cousin   . Breast cancer Cousin     Social History History   Substance Use Topics  . Smoking status: Former Smoker    Quit date: 06/23/1983  . Smokeless tobacco: Never Used  . Alcohol Use: 1.0 oz/week    2 drink(s) per week    No Known Allergies  Current Outpatient Prescriptions  Medication Sig Dispense Refill  . ALPRAZolam (XANAX) 0.5 MG tablet Take 1 tablet (0.5 mg total) by mouth 2 (two) times daily as needed for anxiety.  180 tablet  1  . Calcium Carbonate-Vitamin D (CALCIUM 600+D) 600-400 MG-UNIT per tablet Take 1 tablet by mouth daily.      . celecoxib (CELEBREX) 200 MG capsule Take 1 capsule (200 mg total) by mouth daily.  30 capsule  11  . CRANBERRY EXTRACT PO Take 1 tablet by mouth daily.      Marland Kitchen HYDROcodone-acetaminophen (NORCO/VICODIN) 5-325 MG per tablet Take 1 tablet by mouth every 6 (six) hours as needed for moderate pain.  60 tablet  0  . levothyroxine (SYNTHROID, LEVOTHROID) 112 MCG tablet TAKE 1 TABLET DAILY  90 tablet  2  . lisdexamfetamine (VYVANSE) 20 MG capsule Take 1 capsule (20 mg total) by mouth daily.  30 capsule  0  . methocarbamol (ROBAXIN) 500 MG tablet Take 1 tablet (500 mg total) by mouth every 6 (six) hours as needed for muscle spasms.  120 tablet  5  . Multiple Vitamin (MULTIVITAMIN) tablet Take 1 tablet by mouth daily.        Marland Kitchen  omeprazole (PRILOSEC) 40 MG capsule Take 1 capsule (40 mg total) by mouth 2 (two) times daily as needed.  180 capsule  3  . traZODone (DESYREL) 100 MG tablet TAKE 2 TABLETS EVERY NIGHT  60 tablet  8   No current facility-administered medications for this visit.    Review of Systems Review of Systems  Constitutional: Negative for fever, chills and unexpected weight change.  HENT: Negative for congestion, hearing loss, sore throat, trouble swallowing and voice change.   Eyes: Negative for visual disturbance.  Respiratory: Negative for cough and wheezing.   Cardiovascular: Negative for chest pain, palpitations and leg swelling.  Gastrointestinal: Negative for nausea, vomiting, abdominal  pain, diarrhea, constipation, blood in stool, abdominal distention and anal bleeding.  Genitourinary: Negative for hematuria, vaginal bleeding and difficulty urinating.  Musculoskeletal: Negative for arthralgias.  Skin: Negative for rash and wound.  Neurological: Negative for seizures, syncope and headaches.  Hematological: Negative for adenopathy. Does not bruise/bleed easily.  Psychiatric/Behavioral: Negative for confusion.    There were no vitals taken for this visit.  Physical Exam Physical Exam  Constitutional: She is oriented to person, place, and time. She appears well-developed and well-nourished. No distress.  HENT:  Head: Normocephalic and atraumatic.  Nose: Nose normal.  Mouth/Throat: No oropharyngeal exudate.  Eyes: Conjunctivae and EOM are normal. Pupils are equal, round, and reactive to light. Left eye exhibits no discharge. No scleral icterus.  Neck: Neck supple. No JVD present. No tracheal deviation present. No thyromegaly present.  Cardiovascular: Normal rate, regular rhythm, normal heart sounds and intact distal pulses.   No murmur heard. Pulmonary/Chest: Effort normal and breath sounds normal. No respiratory distress. She has no wheezes. She has no rales. She exhibits no tenderness.  Small bruise right breast, upper outer quadrant. Minimal hematoma but slight thickening under the bruise. No other skin changes. No axillary adenopathy.  Abdominal: Soft. Bowel sounds are normal. She exhibits no distension and no mass. There is no tenderness. There is no rebound and no guarding.  Healed laparoscopic scars from gallbladder surgery  Musculoskeletal: She exhibits no edema and no tenderness.  Lymphadenopathy:    She has no cervical adenopathy.  Neurological: She is alert and oriented to person, place, and time. She exhibits normal muscle tone. Coordination normal.  Skin: Skin is warm. No rash noted. She is not diaphoretic. No erythema. No pallor.  Psychiatric: She has a  normal mood and affect. Her behavior is normal. Judgment and thought content normal.    Data Reviewed I reviewed her imaging studies, histology, breast diagnostic profile. I have discussed her case in breast tumor board this morning. I have coordinated her care with Dr. Humphrey Rolls and Dr. Valere Dross.  Assessment    Ductal carcinoma in situ right breast, upper outer quadrant, 1 cm diameter, grade 1, receptor positive.   cTis, N0.   Very good candidate for breast conservation surgery.  Hypothyroidism, on replacement therapy  GERD, on PPI     Plan    We spent a long time discussing the type of breast cancer and the stage of her breast cancer with her. We talked surgical options including lumpectomy plus radiation therapy versus mastectomy. The duct about the advantages and disadvantages of each. I told her I did not think there was a survival advantage with mastectomy and I told her that lumpectomy, in my opinion, was in her best interest. She agrees with this completely. She is willing to undergo lumpectomy and radiation therapy and antiestrogen therapy.  She'll be  scheduled for a right partial mastectomy with radioactive seed localization in the near future.  I discussed the indications, details, techniques, and numerous risk of the surgery with her and her husband. They're aware of the risk of bleeding, infection, cosmetic deformity, reoperation for positive margins, reoperations if this is invasive disease and she needs node biopsy, and other unforeseen problems. She understands all these issues well. It is tunneled for questions are answered. She agrees with this plan.       Edsel Petrin. Dalbert Batman, M.D., [facility] General and Minimally invasive Surgery Breast and Colorectal Surgery Office:   [phone] Pager:   [phone]  08/23/2013, 2:11 PM

## 2013-08-23 NOTE — Progress Notes (Signed)
[facility] NEW PATIENT EVALUATION  Name: Kristina Pearson MRN: [id number]  Date:   08/23/2013           DOB: 09-Feb-1956  Status: outpatient   CC: Laurey Morale, MD  [name], MD    REFERRING PHYSICIAN: [name], MD   DIAGNOSIS: Stage 0 (Tis N0 M0) low-grade DCIS of the right breast   HISTORY OF PRESENT ILLNESS:  Kristina Pearson is a 58 y.o. female who is seen today at the [facility] through the courtesy of Dr. Dalbert Batman for evaluation of her DCIS of the right breast. At the time of a screening mammogram on 07/26/2013 she was felt to have new calcifications for which she underwent additional views on 08/09/2013. She was noted to have calcifications over an area of 1 cm within the upper-outer quadrant of the right breast. She underwent a stereotactic core biopsy on 08/15/2013 with a diagnosis of DCIS with calcifications with the in situ carcinoma  felt to be grade 1. Breast MR on 08/21/2013 showed biopsy changes within the lateral aspect of the right breast. There were no other lesions are appreciated. She does report having vague right chest and back discomfort and her husband reminds her that she recently increased her swimming activity that may be responsible for her musculoskeletal discomfort. She is seen today with Dr. Dalbert Batman and Dr. Humphrey Rolls  PREVIOUS RADIATION THERAPY: No   PAST MEDICAL HISTORY:  has a past medical history of Depression; Allergy; Hypothyroid; Sialoadenitis; GERD (gastroesophageal reflux disease); Headache(784.0); Arthritis; and ADHD (attention deficit hyperactivity disorder).     PAST SURGICAL HISTORY:  Past Surgical History  Procedure Laterality Date  . Cholecystectomy  1996     FAMILY HISTORY: family history includes Arthritis in her mother; Breast cancer in her cousin and cousin; Colon cancer in her cousin and maternal uncle; Diabetes in her mother; Heart attack in her father; Leukemia in her maternal uncle.  Two paternal  second  cousins were diagnosed with breast cancer, one in her 36s and the other at age 27.   SOCIAL HISTORY:  reports that she quit smoking about 30 years ago. She has never used smokeless tobacco. She reports that she drinks about 1.0 ounces of alcohol per week. She reports that she does not use illicit drugs. Married, one daughter. She worked as a Education officer, museum and also as a Pharmacist, hospital.   ALLERGIES: Review of patient's allergies indicates no known allergies.   MEDICATIONS:  Current Outpatient Prescriptions  Medication Sig Dispense Refill  . ALPRAZolam (XANAX) 0.5 MG tablet Take 1 tablet (0.5 mg total) by mouth 2 (two) times daily as needed for anxiety.  180 tablet  1  . Calcium Carbonate-Vitamin D (CALCIUM 600+D) 600-400 MG-UNIT per tablet Take 1 tablet by mouth daily.      . celecoxib (CELEBREX) 200 MG capsule Take 1 capsule (200 mg total) by mouth daily.  30 capsule  11  . CRANBERRY EXTRACT PO Take 1 tablet by mouth daily.      Marland Kitchen HYDROcodone-acetaminophen (NORCO/VICODIN) 5-325 MG per tablet Take 1 tablet by mouth every 6 (six) hours as needed for moderate pain.  60 tablet  0  . levothyroxine (SYNTHROID, LEVOTHROID) 112 MCG tablet TAKE 1 TABLET DAILY  90 tablet  2  . lisdexamfetamine (VYVANSE) 20 MG capsule Take 1 capsule (20 mg total) by mouth daily.  30 capsule  0  . methocarbamol (ROBAXIN) 500 MG tablet Take 1 tablet (500 mg total) by mouth every  6 (six) hours as needed for muscle spasms.  120 tablet  5  . Multiple Vitamin (MULTIVITAMIN) tablet Take 1 tablet by mouth daily.        Marland Kitchen omeprazole (PRILOSEC) 40 MG capsule Take 1 capsule (40 mg total) by mouth 2 (two) times daily as needed.  180 capsule  3  . traZODone (DESYREL) 100 MG tablet TAKE 2 TABLETS EVERY NIGHT  60 tablet  8   No current facility-administered medications for this encounter.     REVIEW OF SYSTEMS:  Pertinent items are noted in HPI.    PHYSICAL EXAM: Alert and oriented 58 year old white female appearing her stated age. Wt  Readings from Last 3 Encounters:  08/23/13 143 lb 9.6 oz (65.137 kg)  04/25/13 143 lb (64.864 kg)  01/16/13 150 lb (68.04 kg)   Temp Readings from Last 3 Encounters:  08/23/13 98.3 F (36.8 C) Oral  04/25/13 99 F (37.2 C)   01/16/13 98.4 F (36.9 C)    BP Readings from Last 3 Encounters:  08/23/13 112/75  04/25/13 104/64  01/16/13 110/76   Pulse Readings from Last 3 Encounters:  08/23/13 58  04/25/13 92  01/16/13 82   Head neck examination: Grossly unremarkable. Nodes: Without palpable cervical, supraclavicular, or axillary lymphadenopathy. Chest: Lungs clear. Breasts: There is a punctate biopsy wound at approximately 10:00 along the upper-outer quadrant of the right breast with minimal ecchymosis. No dominant masses are appreciated. Left breast without masses or lesions. Abdomen without hepatomegaly. Extremities: Without edema.    LABORATORY DATA:  Lab Results  Component Value Date   WBC 4.2 08/23/2013   HGB 12.5 08/23/2013   HCT 37.7 08/23/2013   MCV 96.6 08/23/2013   PLT 208 08/23/2013   Lab Results  Component Value Date   NA 139 08/23/2013   K 4.6 08/23/2013   CL 107 11/26/2011   CO2 29 08/23/2013   Lab Results  Component Value Date   ALT 10 08/23/2013   AST 19 08/23/2013   ALKPHOS 46 08/23/2013   BILITOT 0.45 08/23/2013      IMPRESSION: Stage 0 (Tis N0 M0) low-grade DCIS of the right breast. I explained to the patient and her husband that her local treatment options include partial mastectomy followed by radiation therapy or mastectomy alone. I feel that she would be a good candidate for breast preservation. We discussed the potential acute and late toxicities of radiation therapy. She may be a candidate for hypo-fractionated radiation therapy. I'll probably obtain a pretreatment mammogram to confirm removal of all suspicious microcalcifications. I can see her postoperatively. She would also be a candidate for postradiation antiestrogen therapy which has been shown to decrease the  risk for local recurrence by approximately 40% and also decrease the risk for development of a new cancer in either breast by approximately 40%.   PLAN: As discussed above.   I spent 30 minutes minutes face to face with the patient and more than 50% of that time was spent in counseling and/or coordination of care.

## 2013-08-24 ENCOUNTER — Encounter (HOSPITAL_BASED_OUTPATIENT_CLINIC_OR_DEPARTMENT_OTHER): Payer: Self-pay | Admitting: *Deleted

## 2013-08-24 ENCOUNTER — Telehealth: Payer: Self-pay | Admitting: *Deleted

## 2013-08-24 NOTE — Progress Notes (Signed)
Had labs 08/23/13-cbc cmet-ekg 6/14-only needs cxr-info given where to go and orders faxed [facility] 08/29/13

## 2013-08-24 NOTE — Telephone Encounter (Signed)
Faxed Care Plan to PCP and took to Med [facility] to scan.  

## 2013-08-25 ENCOUNTER — Other Ambulatory Visit (INDEPENDENT_AMBULATORY_CARE_PROVIDER_SITE_OTHER): Payer: Self-pay | Admitting: General Surgery

## 2013-08-25 ENCOUNTER — Encounter (INDEPENDENT_AMBULATORY_CARE_PROVIDER_SITE_OTHER): Payer: Self-pay

## 2013-08-25 DIAGNOSIS — C50411 Malignant neoplasm of upper-outer quadrant of right female breast: Secondary | ICD-10-CM

## 2013-08-27 NOTE — Progress Notes (Signed)
Kristina Pearson [id number] Nov 17, 1955 58 y.o. 08/27/2013 4:42 PM  CC  Kristina Morale, MD [address] [place] [address] Dr. Fanny Skates Dr. Arloa Koh  REASON FOR CONSULTATION:  58 year old female with new diagnosis of DCIS of the right breast. Patient is seen in the multidisciplinary breast clinic for discussion of treatment options.  STAGE:   Breast cancer of upper-outer quadrant of right female breast   Primary site: Breast (Right)   Staging method: AJCC 7th Edition   Clinical: Stage 0 (Tis, N0, cM0)   Summary: Stage 0 (Tis, N0, cM0)  REFERRING PHYSICIAN: Dr. Fanny Skates  HISTORY OF PRESENT ILLNESS:  Kristina Pearson is a 58 y.o. female.  Who underwent a screening mammogram on 07/26/2013. She was found to have new calcifications. Additional views were performed on 08/09/2013. She was noted to have calcifications over an area 1 cm within the upper-outer quadrant of the right breast. On 08/15/2013 she had a stereotactic core biopsy performed. 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 The DCIS was low grade. On 08/21/2013 she had MRI of the breasts performed that showed biopsy changes only no other lesions. She is now seen in the multidisciplinary breast clinic. Her case was discussed at the multidisciplinary breast conference. She is without any significant complaints other than some vague right chest and back discomfort.   Past Medical History: Past Medical History  Diagnosis Date  . Depression   . Allergy   . Hypothyroid   . Sialoadenitis   . GERD (gastroesophageal reflux disease)   . Headache(784.0)   . Arthritis     osteoarthritrs  . ADHD (attention deficit hyperactivity disorder)   . PONV (postoperative nausea and vomiting)   . Wears glasses     Past Surgical History: Past Surgical History  Procedure Laterality Date  . Diagnostic laparoscopy  1982    exp  . Colonoscopy    . Cholecystectomy  1996    lap  . Upper gi  endoscopy      Family History: Family History  Problem Relation Age of Onset  . Arthritis Mother   . Diabetes Mother   . Heart attack Father   . Colon cancer Maternal Uncle   . Leukemia Maternal Uncle   . Colon cancer Cousin   . Breast cancer Cousin   . Breast cancer Cousin     Social History History  Substance Use Topics  . Smoking status: Former Smoker    Quit date: 06/23/1983  . Smokeless tobacco: Never Used  . Alcohol Use: 1.0 oz/week    2 drink(s) per week     Comment: occ    Allergies: No Known Allergies  Current Medications: Current Outpatient Prescriptions  Medication Sig Dispense Refill  . ALPRAZolam (XANAX) 0.5 MG tablet Take 1 tablet (0.5 mg total) by mouth 2 (two) times daily as needed for anxiety.  180 tablet  1  . Calcium Carbonate-Vitamin D (CALCIUM 600+D) 600-400 MG-UNIT per tablet Take 1 tablet by mouth daily.      . celecoxib (CELEBREX) 200 MG capsule Take 1 capsule (200 mg total) by mouth daily.  30 capsule  11  . CRANBERRY EXTRACT PO Take 1 tablet by mouth daily.      Marland Kitchen HYDROcodone-acetaminophen (NORCO/VICODIN) 5-325 MG per tablet Take 1 tablet by mouth every 6 (six) hours as needed for moderate pain.  60 tablet  0  . levothyroxine (SYNTHROID, LEVOTHROID) 112 MCG tablet TAKE 1 TABLET DAILY  90 tablet  2  .  lisdexamfetamine (VYVANSE) 20 MG capsule Take 1 capsule (20 mg total) by mouth daily.  30 capsule  0  . methocarbamol (ROBAXIN) 500 MG tablet Take 1 tablet (500 mg total) by mouth every 6 (six) hours as needed for muscle spasms.  120 tablet  5  . Multiple Vitamin (MULTIVITAMIN) tablet Take 1 tablet by mouth daily.        Marland Kitchen omeprazole (PRILOSEC) 40 MG capsule Take 1 capsule (40 mg total) by mouth 2 (two) times daily as needed.  180 capsule  3  . traZODone (DESYREL) 100 MG tablet TAKE 2 TABLETS EVERY NIGHT  60 tablet  8   No current facility-administered medications for this visit.    OB/GYN History: Menarche at age 79 she underwent menopause in  2007 at approximately age 89. No hormone replacement therapy first live birth at 44  Fertility Discussion: Not applicable Prior History of Cancer: No  Health Maintenance:  Colonoscopy yes 2007 Bone Density yes February 2015 Last PAP smear unknown  ECOG PERFORMANCE STATUS: 0 - Asymptomatic  Genetic Counseling/testing: yes  REVIEW OF SYSTEMS:  A comprehensive review of systems was negative.  PHYSICAL EXAMINATION: Blood pressure 112/75, pulse 58, temperature 98.3 F (36.8 C), temperature source Oral, resp. rate 18, height 5\' 6"  (1.676 m), weight 143 lb 9.6 oz (65.137 kg).  General:  well-nourished in no acute distress.  Eyes:  no scleral icterus.  ENT:  There were no oropharyngeal lesions.  Neck was without thyromegaly.  Lymphatics:  Negative cervical, supraclavicular or axillary adenopathy.  Respiratory: lungs were clear bilaterally without wheezing or crackles.  Cardiovascular:  Regular rate and rhythm, S1/S2, without murmur, rub or gallop.  There was no pedal edema.  GI:  abdomen was soft, flat, nontender, nondistended, without organomegaly.  Muscoloskeletal:  no spinal tenderness of palpation of vertebral spine.  Skin exam was without echymosis, petichae.  Neuro exam was nonfocal.  Patient was able to get on and off exam table without assistance.  Gait was normal.  Patient was alerted and oriented.  Attention was good.   Language was appropriate.  Mood was normal without depression.  Speech was not pressured.  Thought content was not tangential.   Breasts: right breast normal with hematoma at biopsy site, skin or nipple changes or axillary nodes, left breast normal without mass, skin or nipple changes or axillary nodes.   STUDIES/RESULTS: Mr Breast Bilateral W Wo Contrast  08/21/2013   CLINICAL DATA:  Recently diagnosed right breast DCIS.  EXAM: BILATERAL BREAST MRI WITH AND WITHOUT CONTRAST  LABS:  None.  TECHNIQUE: Multiplanar, multisequence MR images of both breasts were obtained  prior to and following the intravenous administration of 29ml of MultiHance.  THREE-DIMENSIONAL MR IMAGE RENDERING ON INDEPENDENT WORKSTATION:  Three-dimensional MR images were rendered by post-processing of the original MR data on an independent workstation. The three-dimensional MR images were interpreted, and findings are reported in the following complete MRI report for this study. Three dimensional images were evaluated at the independent DynaCad workstation  COMPARISON:  Previous exams  FINDINGS: Breast composition: c:  Heterogeneous fibroglandular tissue  Background parenchymal enhancement: Moderate  Right breast: A 2.5 cm ring-enhancing hematoma from the biopsy is seen in the lateral aspect of the right breast associated with a signal void artifact from the biopsy clip. No other areas of abnormal enhancement are seen in the right breast.  Left breast: No mass or abnormal enhancement.  Lymph nodes: No abnormal appearing lymph nodes.  Ancillary findings:  None.  IMPRESSION:  Expected biopsy changes in lateral aspect of the right breast.  RECOMMENDATION: Treatment planning of the right breast ductal carcinoma in situ is recommended.  BI-RADS CATEGORY  6: Known biopsy-proven malignancy - appropriate action should be taken.   Electronically Signed   By: Lillia Mountain M.D.   On: 08/21/2013 13:58   Mm Digital Diagnostic Unilat R  08/09/2013   CLINICAL DATA:  Screening callback for questioned right breast calcifications  EXAM: DIGITAL DIAGNOSTIC  right MAMMOGRAM  COMPARISON:  Prior exams  ACR Breast Density Category c: The breast tissue is heterogeneously dense, which may obscure small masses.  FINDINGS: Additional views demonstrate clustered coarse heterogeneous calcifications in the right upper outer quadrant measuring approximately 1 cm. Although calcifications or present in this region previously, there are new calcifications since the prior exam in this area.  IMPRESSION: Suspicious right upper outer quadrant  calcifications.  RECOMMENDATION: Right stereotactic core needle biopsy. This will be scheduled at the patient's convenience.  I have discussed the findings and recommendations with the patient. Results were also provided in writing at the conclusion of the visit. If applicable, a reminder letter will be sent to the patient regarding the next appointment.  BI-RADS CATEGORY  4: Suspicious abnormality - biopsy should be considered.   Electronically Signed   By: Conchita Paris M.D.   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 The pathology is concordant with the imaging findings. I have discussed the findings with the patient by telephone and answered her questions. The patient states her biopsy site is clean and dry without hematoma formation. Post biopsy wound care instructions were reviewed with the patient. The patient has been scheduled for the breast cancer multidisciplinary Clinic on 08/23/2013. Breast MRI is planned for 08/21/2013. The patient was encouraged to call the breast center for additional questions or concerns.   Electronically Signed   By: Luberta Robertson M.D.   On: 08/16/2013 13:06   08/16/2013   CLINICAL DATA:  Right breast calcifications.  EXAM: Right STEREOTACTIC CORE NEEDLE BIOPSY  COMPARISON:  Previous exams.  FINDINGS: The patient and I discussed the procedure of stereotactic-guided biopsy including benefits and alternatives. We discussed the high likelihood of a successful procedure. We discussed the risks of the procedure including infection, bleeding, tissue injury, clip migration, and inadequate sampling. Informed written consent was given. The usual time out protocol was performed immediately prior to the procedure.  Using sterile technique and 2%  lidocaine as local anesthetic, under stereotactic guidance, a 9 gauge vacuum assisted core biopsy device was used to perform core needle biopsy of calcifications located laterally within the right breast using a lateral approach. Specimen radiograph was performed showing multiple representative calcifications within the specimen. Specimens with calcifications are identified for pathology.  At the conclusion of the procedure, an X shaped tissue marker clip was deployed into the biopsy cavity. Follow-up 2-view mammogram confirmed clip to be in appropriate position.  IMPRESSION: Stereotactic-guided biopsy of right breast calcifications as discussed above. No apparent complications.  Electronically Signed: By: Luberta Robertson M.D. On: 08/15/2013 12:13     LABS:    Chemistry      Component Value Date/Time   NA 139 08/23/2013 1156   NA 141 11/26/2011 1223   K 4.6 08/23/2013 1156  K 4.7 11/26/2011 1223   CL 107 11/26/2011 1223   CO2 29 08/23/2013 1156   CO2 28 11/26/2011 1223   BUN 8.7 08/23/2013 1156   BUN 14 11/26/2011 1223   CREATININE 0.9 08/23/2013 1156   CREATININE 1.0 11/26/2011 1223      Component Value Date/Time   CALCIUM 9.8 08/23/2013 1156   CALCIUM 9.1 11/26/2011 1223   ALKPHOS 46 08/23/2013 1156   ALKPHOS 39 11/26/2011 1223   AST 19 08/23/2013 1156   AST 21 11/26/2011 1223   ALT 10 08/23/2013 1156   ALT 17 11/26/2011 1223   BILITOT 0.45 08/23/2013 1156   BILITOT 0.7 11/26/2011 1223      Lab Results  Component Value Date   WBC 4.2 08/23/2013   HGB 12.5 08/23/2013   HCT 37.7 08/23/2013   MCV 96.6 08/23/2013   PLT 208 08/23/2013     PATHOLOGY 08/15/13 PROGNOSTIC INDICATORS - ACIS Results: IMMUNOHISTOCHEMICAL AND MORPHOMETRIC ANALYSIS BY THE AUTOMATED CELLULAR IMAGING SYSTEM (ACIS) Estrogen Receptor: 100%, POSITIVE, STRONG STAINING INTENSITY Progesterone Receptor: 75%, POSITIVE, STRONG STAINING INTENSITY REFERENCE RANGE ESTROGEN RECEPTOR NEGATIVE <1% POSITIVE =>1% PROGESTERONE RECEPTOR NEGATIVE <1% POSITIVE  =>1% All controls stained appropriately Claudette Laws MD Pathologist, Electronic Signature ( Signed 08/18/2013) FINAL DIAGNOSIS Diagnosis Breast, right, needle core biopsy, lateral - DUCTAL CARCINOMA IN SITU WITH CALCIFICATIONS, SEE COMMENT. - LOBULAR NEOPLASIA (ATYPICAL LOBULAR HYPERPLASIA). Microscopic Comment Although the grade of tumor is best assessed at resection , in these biopsies, the in situ carcinoma is grade I. 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 MRI showed post biopsy changes. The DCIS was ER positive PR positive.    #2. We spent the better part of today's hour-long appointment discussing the biology of breast cancer in general, and the specifics of the patient's tumor in particular. We discussed the multidisciplinary approach to breast cancer treatment. We went over her pathology. She understands that her cancer is a non invasive disease. She is a good candidate for surgery consisting of lumpectomy.she has been seen by  Radiation and surery. We discussed the role of adjuvant anti-estrogen therapy to help prevent future ER+ breast cancer in the ipsilateral and contralateral breasts. We discussed the different drugs available including tamoxifen. Which would be 20 mg daily for a total of 5 years. We also discussed aromasin 25 mg daily for 5 years. We discussed the side effects of both medications.she will not begin anti-estrogen therapy until after completion of radiation therapy  3. Patient will proceed with surgery first.  4. We will also refer her to genetics for counseling and testing based on her family history.  5. I will see the patient back after the surgery.  Clinical Trial Eligibility:  no Multidisciplinary conference discussion yes    Discussion: Patient is being treated per NCCN breast cancer care guidelines appropriate for stage.0   Thank you so much for allowing me to participate in the care of [facility]. I will continue to follow up the patient with you and assist in her care.  All questions were answered. The patient knows to call the clinic with any problems, questions or concerns. We can certainly see the patient much sooner if necessary.  I spent 40 minutes counseling the  patient face to face. The total time spent in the appointment was 60 minutes.  Marcy Panning, MD Medical/Oncology [facility] [phone] (beeper) [phone] (Office)

## 2013-08-28 ENCOUNTER — Ambulatory Visit
Admission: RE | Admit: 2013-08-28 | Discharge: 2013-08-28 | Disposition: A | Discharge status: Home / Self Care | Payer: 59 | Source: Ambulatory Visit | Attending: General Surgery | Admitting: General Surgery

## 2013-08-28 NOTE — H&P (Signed)
Kristina Pearson   MRN:  [id number]   Description: 58 year old female  Provider: [name], MD  Department: [facility]          History and Physical    [name], MD    Status: Signed            Patient ID: Kristina Pearson, female   DOB: Mar 11, 1956, 58 y.o.   MRN: [id number]            HPI Kristina Pearson is a 58 y.o. female.  She is referred by Dr. 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 Dr. Gordan Payment  is her primary care physician. She is being evaluated in the [facility] today by Dr. Marcy Panning, Arloa Koh, and me.    She has no prior history of breast problems she does have some intermittent spontaneous pain. No nipple discharge. She gets routine regular mammograms. Recent mammograms show a focal area of clustered calcifications in the right breast, upper outer quadrant, 1 cm in dimension. Image and biopsy shows grade 1 adenocarcinoma in situ and some atypical lobular hyperplasia. ER-positive 100%, PR-positive 75%. MRI shows solitary biopsy changes only. She is here today with her husband.   She has minimal comorbidities. Hypothyroidism, GERD, headaches, ADHD, minimal depression.   History is positive for 2 paternal second cousins with breast cancer. One died of metastatic disease in her 71s and one is living. No other history of breast or ovarian cancer in the family.   She is married. 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        Past Medical History   Diagnosis  Date   .  Depression     .  Allergy     .  Hypothyroid     .  Sialoadenitis     .  GERD (gastroesophageal reflux disease)     .  Headache(784.0)     .  Arthritis         osteoarthritrs   .  ADHD (attention deficit hyperactivity disorder)           Past Surgical History   Procedure  Laterality  Date   .  Cholecystectomy    1996          Family History   Problem  Relation  Age of Onset   .  Arthritis  Mother     .  Diabetes  Mother     .  Heart attack  Father     .  Colon cancer  Maternal Uncle     .  Leukemia  Maternal Uncle     .  Colon cancer  Cousin     .  Breast cancer  Cousin     .  Breast cancer  Cousin          Social History History   Substance Use Topics   .  Smoking status:  Former Smoker       Quit date:  06/23/1983   .  Smokeless tobacco:  Never Used   .  Alcohol Use:  1.0 oz/week       2 drink(s) per week        No Known Allergies    Current Outpatient Prescriptions   Medication  Sig  Dispense  Refill   .  ALPRAZolam (XANAX) 0.5 MG tablet  Take 1 tablet (0.5 mg total) by mouth 2 (two) times daily as needed for anxiety.   180 tablet   1   .  Calcium Carbonate-Vitamin D (CALCIUM 600+D) 600-400 MG-UNIT per tablet  Take 1 tablet by mouth daily.         .  celecoxib (CELEBREX) 200 MG capsule  Take 1 capsule (200 mg total) by mouth daily.   30 capsule   11   .  CRANBERRY EXTRACT PO  Take 1 tablet by mouth daily.         Marland Kitchen  HYDROcodone-acetaminophen (NORCO/VICODIN) 5-325 MG per tablet  Take 1 tablet by mouth every 6 (six) hours as needed for moderate pain.   60 tablet   0   .  levothyroxine (SYNTHROID, LEVOTHROID) 112 MCG tablet  TAKE 1 TABLET DAILY   90 tablet   2   .  lisdexamfetamine (VYVANSE) 20 MG capsule  Take 1 capsule (20 mg total) by mouth daily.   30 capsule   0   .  methocarbamol (ROBAXIN) 500 MG tablet  Take 1 tablet (500 mg total) by mouth every 6 (six) hours as needed for muscle spasms.   120 tablet   5   .  Multiple Vitamin (MULTIVITAMIN) tablet  Take 1 tablet by mouth daily.           Marland Kitchen  omeprazole (PRILOSEC) 40 MG capsule  Take 1 capsule (40 mg total) by mouth 2 (two) times daily as needed.   180 capsule   3   .  traZODone (DESYREL) 100 MG tablet  TAKE 2 TABLETS EVERY NIGHT   60 tablet   8           Review of Systems   Constitutional: Negative for fever, chills and  unexpected weight change.  HENT: Negative for congestion, hearing loss, sore throat, trouble swallowing and voice change.   Eyes: Negative for visual disturbance.  Respiratory: Negative for cough and wheezing.   Cardiovascular: Negative for chest pain, palpitations and leg swelling.  Gastrointestinal: Negative for nausea, vomiting, abdominal pain, diarrhea, constipation, blood in stool, abdominal distention and anal bleeding.  Genitourinary: Negative for hematuria, vaginal bleeding and difficulty urinating.  Musculoskeletal: Negative for arthralgias.  Skin: Negative for rash and wound.  Neurological: Negative for seizures, syncope and headaches.  Hematological: Negative for adenopathy. Does not bruise/bleed easily.  Psychiatric/Behavioral: Negative for confusion.       Physical Exam  Constitutional: She is oriented to person, place, and time. She appears well-developed and well-nourished. No distress.  HENT:   Head: Normocephalic and atraumatic.   Nose: Nose normal.   Mouth/Throat: No oropharyngeal exudate.  Eyes: Conjunctivae and EOM are normal. Pupils are equal, round, and reactive to light. Left eye exhibits no discharge. No scleral icterus.  Neck: Neck supple. No JVD present. No tracheal deviation present. No thyromegaly present.  Cardiovascular: Normal rate, regular rhythm, normal heart sounds and intact distal pulses.    No murmur heard. Pulmonary/Chest: Effort normal and breath sounds normal. No respiratory distress. She has no wheezes. She has no rales. She exhibits no tenderness.  Small bruise right breast, upper outer quadrant. Minimal hematoma but slight thickening under the bruise. No other skin changes. No axillary adenopathy.  Abdominal: Soft. Bowel sounds are normal. She exhibits no distension and no mass. There is no tenderness. There is no rebound and no guarding.  Healed laparoscopic scars from gallbladder surgery  Musculoskeletal: She exhibits no edema and no  tenderness.  Lymphadenopathy:  She has no cervical adenopathy.  Neurological: She is alert and oriented to person, place, and time. She exhibits normal muscle tone. Coordination normal.  Skin: Skin is warm. No rash noted. She is not diaphoretic. No erythema. No pallor.  Psychiatric: She has a normal mood and affect. Her behavior is normal. Judgment and thought content normal.      Data Reviewed I reviewed her imaging studies, histology, breast diagnostic profile. I have discussed her case in breast tumor board this morning. I have coordinated her care with Dr. Humphrey Rolls and Dr. Valere Dross.   Assessment    Ductal carcinoma in situ right breast, upper outer quadrant, 1 cm diameter, grade 1, receptor positive.   cTis, N0.   Very good candidate for breast conservation surgery.   Hypothyroidism, on replacement therapy   GERD, on PPI      Plan    We spent a long time discussing the type of breast cancer and the stage of her breast cancer with her. We talked surgical options including lumpectomy plus radiation therapy versus mastectomy. We talked about the advantages and disadvantages of each. I told her I did not think there was a survival advantage with mastectomy and I told her that lumpectomy, in my opinion, was in her best interest. She agrees with this completely. She is willing to undergo lumpectomy and radiation therapy and antiestrogen therapy.   She'll be scheduled for a right partial mastectomy with radioactive seed localization in the near future.   I discussed the indications, details, techniques, and numerous risk of the surgery with her and her husband. They're aware of the risk of bleeding, infection, cosmetic deformity, reoperation for positive margins, reoperations if this is invasive disease and she needs node biopsy, and other unforeseen problems. She understands all these issues well. It is tunneled for questions are answered. She agrees with this plan.           Edsel Petrin. Dalbert Batman, M.D., [facility] General and Minimally invasive Surgery Breast and Colorectal Surgery Office:   [phone] Pager:   [phone]

## 2013-08-29 ENCOUNTER — Ambulatory Visit
Admission: RE | Admit: 2013-08-29 | Discharge: 2013-08-29 | Disposition: A | Discharge status: Home / Self Care | Payer: 59 | Source: Ambulatory Visit | Attending: General Surgery | Admitting: General Surgery

## 2013-08-29 ENCOUNTER — Telehealth: Payer: Self-pay | Admitting: *Deleted

## 2013-08-29 DIAGNOSIS — C50411 Malignant neoplasm of upper-outer quadrant of right female breast: Secondary | ICD-10-CM

## 2013-08-29 NOTE — Telephone Encounter (Signed)
Left message for a return phone call from [facility] 08/23/13.   

## 2013-08-30 ENCOUNTER — Ambulatory Visit
Admit: 2013-08-30 | Discharge: 2013-08-30 | Disposition: A | Discharge status: Home / Self Care | Payer: 59 | Attending: General Surgery | Admitting: General Surgery

## 2013-08-30 ENCOUNTER — Encounter (HOSPITAL_BASED_OUTPATIENT_CLINIC_OR_DEPARTMENT_OTHER): Payer: Self-pay | Admitting: *Deleted

## 2013-08-30 ENCOUNTER — Ambulatory Visit (HOSPITAL_BASED_OUTPATIENT_CLINIC_OR_DEPARTMENT_OTHER): Payer: 59 | Admitting: Anesthesiology

## 2013-08-30 ENCOUNTER — Ambulatory Visit (HOSPITAL_BASED_OUTPATIENT_CLINIC_OR_DEPARTMENT_OTHER)
Admission: RE | Admit: 2013-08-30 | Discharge: 2013-08-30 | Disposition: A | Discharge status: Home / Self Care | Payer: 59 | Source: Ambulatory Visit | Attending: General Surgery | Admitting: General Surgery

## 2013-08-30 ENCOUNTER — Encounter (HOSPITAL_BASED_OUTPATIENT_CLINIC_OR_DEPARTMENT_OTHER): Payer: 59 | Admitting: Anesthesiology

## 2013-08-30 ENCOUNTER — Encounter (HOSPITAL_BASED_OUTPATIENT_CLINIC_OR_DEPARTMENT_OTHER)
Admission: RE | Disposition: A | Discharge status: Home / Self Care | Payer: 59 | Source: Ambulatory Visit | Attending: General Surgery

## 2013-08-30 DIAGNOSIS — D0590 Unspecified type of carcinoma in situ of unspecified breast: Secondary | ICD-10-CM | POA: Insufficient documentation

## 2013-08-30 DIAGNOSIS — F3289 Other specified depressive episodes: Secondary | ICD-10-CM | POA: Insufficient documentation

## 2013-08-30 DIAGNOSIS — E039 Hypothyroidism, unspecified: Secondary | ICD-10-CM | POA: Insufficient documentation

## 2013-08-30 DIAGNOSIS — K219 Gastro-esophageal reflux disease without esophagitis: Secondary | ICD-10-CM | POA: Insufficient documentation

## 2013-08-30 DIAGNOSIS — F329 Major depressive disorder, single episode, unspecified: Secondary | ICD-10-CM | POA: Insufficient documentation

## 2013-08-30 DIAGNOSIS — C50411 Malignant neoplasm of upper-outer quadrant of right female breast: Secondary | ICD-10-CM | POA: Diagnosis present

## 2013-08-30 HISTORY — PX: BREAST LUMPECTOMY WITH RADIOACTIVE SEED LOCALIZATION: SHX6424

## 2013-08-30 HISTORY — DX: Presence of spectacles and contact lenses: Z97.3

## 2013-08-30 HISTORY — DX: Other specified postprocedural states: Z98.890

## 2013-08-30 HISTORY — DX: Nausea with vomiting, unspecified: R11.2

## 2013-08-30 SURGERY — BREAST LUMPECTOMY WITH RADIOACTIVE SEED LOCALIZATION
Anesthesia: General | Site: Breast | Laterality: Right

## 2013-08-30 MED ORDER — PROMETHAZINE HCL 25 MG/ML IJ SOLN: 6.2500 mg | INTRAMUSCULAR | Status: DC | PRN

## 2013-08-30 MED ORDER — OXYCODONE HCL 5 MG PO TABS
5.0000 mg | ORAL_TABLET | Freq: Once | ORAL | Status: AC | PRN
  Administered 2013-08-30: 5 mg via ORAL

## 2013-08-30 MED ORDER — CEFAZOLIN SODIUM-DEXTROSE 2-3 GM-% IV SOLR
INTRAVENOUS | Status: AC
  Filled 2013-08-30: qty 50

## 2013-08-30 MED ORDER — FENTANYL CITRATE 0.05 MG/ML IJ SOLN
INTRAMUSCULAR | Status: AC
Start: 2013-08-30 — End: 2013-08-30
  Filled 2013-08-30: qty 4

## 2013-08-30 MED ORDER — BUPIVACAINE-EPINEPHRINE 0.5-1:200000 % IJ SOLN
INTRAMUSCULAR | Status: DC | PRN
  Administered 2013-08-30: 10 mL

## 2013-08-30 MED ORDER — CEFAZOLIN SODIUM-DEXTROSE 2-3 GM-% IV SOLR
2.0000 g | INTRAVENOUS | Status: AC
  Administered 2013-08-30: 2 g via INTRAVENOUS

## 2013-08-30 MED ORDER — MIDAZOLAM HCL 2 MG/2ML IJ SOLN
INTRAMUSCULAR | Status: AC
  Filled 2013-08-30: qty 2

## 2013-08-30 MED ORDER — MIDAZOLAM HCL 2 MG/2ML IJ SOLN: 1.0000 mg | INTRAMUSCULAR | Status: DC | PRN

## 2013-08-30 MED ORDER — ONDANSETRON HCL 4 MG/2ML IJ SOLN
INTRAMUSCULAR | Status: DC | PRN
  Administered 2013-08-30: 4 mg via INTRAVENOUS

## 2013-08-30 MED ORDER — FENTANYL CITRATE 0.05 MG/ML IJ SOLN
INTRAMUSCULAR | Status: DC | PRN
  Administered 2013-08-30: 25 ug via INTRAVENOUS
  Administered 2013-08-30: 50 ug via INTRAVENOUS
  Administered 2013-08-30: 25 ug via INTRAVENOUS
  Administered 2013-08-30: 100 ug via INTRAVENOUS

## 2013-08-30 MED ORDER — MIDAZOLAM HCL 5 MG/5ML IJ SOLN
INTRAMUSCULAR | Status: DC | PRN
  Administered 2013-08-30: 2 mg via INTRAVENOUS

## 2013-08-30 MED ORDER — BUPIVACAINE-EPINEPHRINE PF 0.5-1:200000 % IJ SOLN
INTRAMUSCULAR | Status: AC
  Filled 2013-08-30: qty 30

## 2013-08-30 MED ORDER — HYDROCODONE-ACETAMINOPHEN 5-325 MG PO TABS: 1.0000 | ORAL_TABLET | ORAL | Status: DC | PRN

## 2013-08-30 MED ORDER — PROPOFOL 10 MG/ML IV BOLUS
INTRAVENOUS | Status: DC | PRN
  Administered 2013-08-30: 200 mg via INTRAVENOUS

## 2013-08-30 MED ORDER — OXYCODONE HCL 5 MG/5ML PO SOLN: 5.0000 mg | Freq: Once | ORAL | Status: AC | PRN

## 2013-08-30 MED ORDER — DEXAMETHASONE SODIUM PHOSPHATE 4 MG/ML IJ SOLN
INTRAMUSCULAR | Status: DC | PRN
  Administered 2013-08-30: 10 mg via INTRAVENOUS

## 2013-08-30 MED ORDER — CHLORHEXIDINE GLUCONATE 4 % EX LIQD: 1.0000 "application " | Freq: Once | CUTANEOUS | Status: DC

## 2013-08-30 MED ORDER — LACTATED RINGERS IV SOLN
INTRAVENOUS | Status: DC
  Administered 2013-08-30: 12:00:00 via INTRAVENOUS

## 2013-08-30 MED ORDER — LIDOCAINE HCL (CARDIAC) 20 MG/ML IV SOLN
INTRAVENOUS | Status: DC | PRN
  Administered 2013-08-30: 100 mg via INTRAVENOUS

## 2013-08-30 MED ORDER — OXYCODONE HCL 5 MG PO TABS
ORAL_TABLET | ORAL | Status: AC
  Filled 2013-08-30: qty 1

## 2013-08-30 MED ORDER — HYDROMORPHONE HCL PF 1 MG/ML IJ SOLN
INTRAMUSCULAR | Status: AC
  Filled 2013-08-30: qty 1

## 2013-08-30 MED ORDER — HYDROMORPHONE HCL PF 1 MG/ML IJ SOLN
0.2500 mg | INTRAMUSCULAR | Status: DC | PRN
  Administered 2013-08-30 (×2): 0.5 mg via INTRAVENOUS

## 2013-08-30 MED ORDER — FENTANYL CITRATE 0.05 MG/ML IJ SOLN: 50.0000 ug | INTRAMUSCULAR | Status: DC | PRN

## 2013-08-30 SURGICAL SUPPLY — 64 items
APPLIER CLIP 9.375 MED OPEN (MISCELLANEOUS) ×3
BENZOIN TINCTURE PRP APPL 2/3 (GAUZE/BANDAGES/DRESSINGS) IMPLANT
BINDER BREAST LRG (GAUZE/BANDAGES/DRESSINGS) ×3 IMPLANT
BINDER BREAST MEDIUM (GAUZE/BANDAGES/DRESSINGS) IMPLANT
BINDER BREAST XLRG (GAUZE/BANDAGES/DRESSINGS) IMPLANT
BINDER BREAST XXLRG (GAUZE/BANDAGES/DRESSINGS) IMPLANT
BLADE HEX COATED 2.75 (ELECTRODE) ×3 IMPLANT
BLADE SURG 10 STRL SS (BLADE) IMPLANT
BLADE SURG 15 STRL LF DISP TIS (BLADE) ×1 IMPLANT
BLADE SURG 15 STRL SS (BLADE) ×2
CANISTER SUC SOCK COL 7IN (MISCELLANEOUS) ×3 IMPLANT
CANISTER SUCT 1200ML W/VALVE (MISCELLANEOUS) ×3 IMPLANT
CHLORAPREP W/TINT 26ML (MISCELLANEOUS) ×3 IMPLANT
CLIP APPLIE 9.375 MED OPEN (MISCELLANEOUS) ×1 IMPLANT
CLOSURE WOUND 1/2 X4 (GAUZE/BANDAGES/DRESSINGS)
COVER MAYO STAND STRL (DRAPES) ×3 IMPLANT
COVER PROBE W GEL 5X96 (DRAPES) ×3 IMPLANT
COVER TABLE BACK 60X90 (DRAPES) ×3 IMPLANT
DECANTER SPIKE VIAL GLASS SM (MISCELLANEOUS) IMPLANT
DERMABOND ADVANCED (GAUZE/BANDAGES/DRESSINGS) ×2
DERMABOND ADVANCED .7 DNX12 (GAUZE/BANDAGES/DRESSINGS) ×1 IMPLANT
DEVICE DUBIN W/COMP PLATE 8390 (MISCELLANEOUS) ×3 IMPLANT
DRAIN CHANNEL 19F RND (DRAIN) IMPLANT
DRAPE LAPAROSCOPIC ABDOMINAL (DRAPES) ×3 IMPLANT
DRAPE UTILITY XL STRL (DRAPES) ×3 IMPLANT
ELECT REM PT RETURN 9FT ADLT (ELECTROSURGICAL) ×3
ELECTRODE REM PT RTRN 9FT ADLT (ELECTROSURGICAL) ×1 IMPLANT
EVACUATOR SILICONE 100CC (DRAIN) IMPLANT
GLOVE BIOGEL PI IND STRL 7.0 (GLOVE) ×1 IMPLANT
GLOVE BIOGEL PI INDICATOR 7.0 (GLOVE) ×2
GLOVE ECLIPSE 6.5 STRL STRAW (GLOVE) ×3 IMPLANT
GLOVE EUDERMIC 7 POWDERFREE (GLOVE) ×6 IMPLANT
GOWN STRL REUS W/ TWL LRG LVL3 (GOWN DISPOSABLE) ×2 IMPLANT
GOWN STRL REUS W/ TWL XL LVL3 (GOWN DISPOSABLE) ×1 IMPLANT
GOWN STRL REUS W/TWL LRG LVL3 (GOWN DISPOSABLE) ×4
GOWN STRL REUS W/TWL XL LVL3 (GOWN DISPOSABLE) ×2
KIT MARKER MARGIN INK (KITS) ×3 IMPLANT
NEEDLE HYPO 25X1 1.5 SAFETY (NEEDLE) ×3 IMPLANT
NS IRRIG 1000ML POUR BTL (IV SOLUTION) ×3 IMPLANT
PACK BASIN DAY SURGERY FS (CUSTOM PROCEDURE TRAY) ×3 IMPLANT
PAD ABD 8X10 STRL (GAUZE/BANDAGES/DRESSINGS) IMPLANT
PENCIL BUTTON HOLSTER BLD 10FT (ELECTRODE) ×3 IMPLANT
PIN SAFETY STERILE (MISCELLANEOUS) IMPLANT
SHEET MEDIUM DRAPE 40X70 STRL (DRAPES) IMPLANT
SLEEVE SCD COMPRESS KNEE MED (MISCELLANEOUS) ×3 IMPLANT
SPONGE GAUZE 4X4 12PLY STER LF (GAUZE/BANDAGES/DRESSINGS) IMPLANT
SPONGE LAP 18X18 X RAY DECT (DISPOSABLE) IMPLANT
SPONGE LAP 4X18 X RAY DECT (DISPOSABLE) ×3 IMPLANT
STAPLER VISISTAT 35W (STAPLE) ×3 IMPLANT
STRIP CLOSURE SKIN 1/2X4 (GAUZE/BANDAGES/DRESSINGS) IMPLANT
SUT ETHILON 3 0 FSL (SUTURE) IMPLANT
SUT MNCRL AB 4-0 PS2 18 (SUTURE) ×3 IMPLANT
SUT SILK 2 0 SH (SUTURE) ×3 IMPLANT
SUT VIC AB 2-0 CT1 27 (SUTURE)
SUT VIC AB 2-0 CT1 TAPERPNT 27 (SUTURE) IMPLANT
SUT VIC AB 3-0 SH 27 (SUTURE)
SUT VIC AB 3-0 SH 27X BRD (SUTURE) IMPLANT
SUT VICRYL 3-0 CR8 SH (SUTURE) ×3 IMPLANT
SYR CONTROL 10ML LL (SYRINGE) ×3 IMPLANT
TOWEL OR 17X24 6PK STRL BLUE (TOWEL DISPOSABLE) ×6 IMPLANT
TOWEL OR NON WOVEN STRL DISP B (DISPOSABLE) IMPLANT
TUBE CONNECTING 20'X1/4 (TUBING) ×1
TUBE CONNECTING 20X1/4 (TUBING) ×2 IMPLANT
YANKAUER SUCT BULB TIP NO VENT (SUCTIONS) ×3 IMPLANT

## 2013-08-30 NOTE — Anesthesia Procedure Notes (Signed)
Procedure Name: LMA Insertion Performed by: Tiffany Talarico W Pre-anesthesia Checklist: Patient identified, Timeout performed, Emergency Drugs available, Suction available and Patient being monitored Patient Re-evaluated:Patient Re-evaluated prior to inductionOxygen Delivery Method: Circle system utilized Preoxygenation: Pre-oxygenation with 100% oxygen Intubation Type: IV induction Ventilation: Mask ventilation without difficulty LMA: LMA inserted LMA Size: 4.0 Number of attempts: 1 Placement Confirmation: positive ETCO2 Tube secured with: Tape Dental Injury: Teeth and Oropharynx as per pre-operative assessment      

## 2013-08-30 NOTE — Op Note (Signed)
Patient Name:           Kristina Pearson   Date of Surgery:        08/30/2013  Pre op Diagnosis:      Ductal carcinoma in situ right breast, lateral quadrant, a 1 cm diameter, grade 1, receptor positive. Clinical stage Tis, N0.  Post op Diagnosis:    Same  Procedure:                 Right partial mastectomy with radioactive seed localization  Surgeon:                     Edsel Petrin. Dalbert Batman, M.D., FACS  Assistant:                      None  Operative Indications:   Kristina Pearson is a 58 y.o. female. She is referred by Dr. 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 Dr. Gordan Payment is her primary care physician. She is being evaluated in the [facility] today by Dr. Marcy Panning, Arloa Koh, and me.  She has no prior history of breast problems she does have some intermittent spontaneous pain. No nipple discharge. She gets routine regular mammograms. Recent mammograms show a focal area of clustered calcifications in the right breast, upper outer quadrant, 1 cm in dimension. Image and biopsy shows grade 1 adenocarcinoma in situ and some atypical lobular hyperplasia. ER-positive 100%, PR-positive 75%. MRI shows solitary biopsy changes only.She is felt to be a good candidate for breast conservation and is brought to the operating room electively     Operative Findings:       The radioactive seed localization technique worked well. The seed was very close to the marker clip. The specimen showed the marker clip and the radioactive seeds to be within the center of the specimen. There was no radioactivity in the breast at the completion of the case. Radioactivity was noted within the lumpectomy specimen following its removal from the patient.  Procedure in Detail:          Radioactive seed was placed in the right breast yesterday at the breast center [facility]. The patient was brought to the  holding area at [facility]. The neoprobe was used and radioactivity was identified in the right breast laterally. The patient was brought to the operating room. General anesthesia with an LMA device was induced. Surgical time out was performed. Intravenous antibiotics were given. The mammographic films were displayed on the computer screen.     Using the neoprobe I isolated the area of highest radioactivity laterally in the right breast. This was actually at about the 9:00 position. 0.5% Marcaine with epinephrine was used for local infiltration anesthetic. A curvilinear incision was made laterally in the right breast. Dissection was carried down carefully into the breast tissues using the neoprobe at several stages to make sure we stayed around and peripheral to the radioactivity. The specimen was removed and marked with silk sutures and the 6 color ink kit. The specimen mammogram looked good as described above. The specimen was marked with pins and sent to the pathology lab. Hemostasis was excellent and achieved with electrocautery. The breast was small. The tissues were approximated as best as possible with interrupted sutures of 3-0 Vicryl. The skin was closed with a running subcuticular suture of 4-0 Monocryl and Dermabond.  A breast binder was placed. The patient was taken to PACU in stable condition. EBL 15 cc. Counts correct. Complications none.     Edsel Petrin. Dalbert Batman, M.D., FACS General and Minimally Invasive Surgery Breast and Colorectal Surgery  08/30/2013 2:09 PM

## 2013-08-30 NOTE — Progress Notes (Unsigned)
[place] Psychosocial Distress Screening Clinical Social Work  Interior and spatial designer met with Patient at breast clinic..  The patient scored a 2 on the Psychosocial Distress Thermometer which indicates mild distress. Clinical Social Worker Intern to assess for distress and other psychosocial needs. CSWI provided written information to Patient on resources and support programs.  Patient is aware to contact support services if further assistance is needed.   Clinical Social Worker follow up needed: no  If yes, follow up plan:   Manette Doto S. [place] Work Intern [employer] [phone]

## 2013-08-30 NOTE — Transfer of Care (Signed)
Immediate Anesthesia Transfer of Care Note  Patient: Kristina Pearson  Procedure(s) Performed: Procedure(s): RIGHT PARTIAL MASTECTOMY WITH RADIOACTIVE SEED LOCALIZATION (Right)  Patient Location: PACU  Anesthesia Type:General  Level of Consciousness: awake, alert  and oriented  Airway & Oxygen Therapy: Patient Spontanous Breathing and Patient connected to face mask oxygen  Post-op Assessment: Report given to PACU RN and Post -op Vital signs reviewed and stable  Post vital signs: Reviewed and stable  Complications: No apparent anesthesia complications

## 2013-08-30 NOTE — Anesthesia Postprocedure Evaluation (Signed)
  Anesthesia Post-op Note  Patient: Kristina Pearson  Procedure(s) Performed: Procedure(s): RIGHT PARTIAL MASTECTOMY WITH RADIOACTIVE SEED LOCALIZATION (Right)  Patient Location: PACU  Anesthesia Type:General  Level of Consciousness: awake and alert   Airway and Oxygen Therapy: Patient Spontanous Breathing  Post-op Pain: mild  Post-op Assessment: Post-op Vital signs reviewed  Post-op Vital Signs: stable  Complications: No apparent anesthesia complications

## 2013-08-30 NOTE — Anesthesia Preprocedure Evaluation (Signed)
Anesthesia Evaluation  Patient identified by MRN, date of birth, ID band  Reviewed: Allergy & Precautions, H&P , NPO status   History of Anesthesia Complications (+) PONV  Airway Mallampati: II  Neck ROM: Full    Dental  (+) Teeth Intact   Pulmonary former smoker,  breath sounds clear to auscultation        Cardiovascular Rhythm:Regular Rate:Normal     Neuro/Psych  Headaches,    GI/Hepatic GERD-  ,  Endo/Other  Hypothyroidism   Renal/GU      Musculoskeletal   Abdominal   Peds  Hematology   Anesthesia Other Findings   Reproductive/Obstetrics                           Anesthesia Physical Anesthesia Plan  ASA: II  Anesthesia Plan: General   Post-op Pain Management:    Induction: Intravenous  Airway Management Planned: LMA  Additional Equipment:   Intra-op Plan:   Post-operative Plan:   Informed Consent: I have reviewed the patients History and Physical, chart, labs and discussed the procedure including the risks, benefits and alternatives for the proposed anesthesia with the patient or authorized representative who has indicated his/her understanding and acceptance.   Dental advisory given  Plan Discussed with: CRNA and Surgeon  Anesthesia Plan Comments:         Anesthesia Quick Evaluation

## 2013-08-30 NOTE — Discharge Instructions (Signed)
[facility] °Office Phone Number [phone] ° °BREAST BIOPSY/ PARTIAL MASTECTOMY: POST OP INSTRUCTIONS ° °Always review your discharge instruction sheet given to you by the facility where your surgery was performed. ° °IF YOU HAVE DISABILITY OR FAMILY LEAVE FORMS, YOU MUST BRING THEM TO THE OFFICE FOR PROCESSING.  DO NOT GIVE THEM TO YOUR DOCTOR. ° °1. A prescription for pain medication may be given to you upon discharge.  Take your pain medication as prescribed, if needed.  If narcotic pain medicine is not needed, then you may take acetaminophen (Tylenol) or ibuprofen (Advil) as needed. °2. Take your usually prescribed medications unless otherwise directed °3. If you need a refill on your pain medication, please contact your pharmacy.  They will contact our office to request authorization.  Prescriptions will not be filled after 5pm or on week-ends. °4. You should eat very light the first 24 hours after surgery, such as soup, crackers, pudding, etc.  Resume your normal diet the day after surgery. °5. Most patients will experience some swelling and bruising in the breast.  Ice packs and a good support bra will help.  Swelling and bruising can take several days to resolve.  °6. It is common to experience some constipation if taking pain medication after surgery.  Increasing fluid intake and taking a stool softener will usually help or prevent this problem from occurring.  A mild laxative (Milk of Magnesia or Miralax) should be taken according to package directions if there are no bowel movements after 48 hours. °7. Unless discharge instructions indicate otherwise, you may remove your bandages 24-48 hours after surgery, and you may shower at that time.  You may have steri-strips (small skin tapes) in place directly over the incision.  These strips should be left on the skin for 7-10 days.  If your surgeon used skin glue on the incision, you may shower in 24 hours.  The glue will flake off over the  next 2-3 weeks.  Any sutures or staples will be removed at the office during your follow-up visit. °8. ACTIVITIES:  You may resume regular daily activities (gradually increasing) beginning the next day.  Wearing a good support bra or sports bra minimizes pain and swelling.  You may have sexual intercourse when it is comfortable. °a. You may drive when you no longer are taking prescription pain medication, you can comfortably wear a seatbelt, and you can safely maneuver your car and apply brakes. °b. RETURN TO WORK:  ______________________________________________________________________________________ °9. You should see your doctor in the office for a follow-up appointment approximately two weeks after your surgery.  Your doctor’s nurse will typically make your follow-up appointment when she calls you with your pathology report.  Expect your pathology report 2-3 business days after your surgery.  You may call to check if you do not hear from us after three days. °10. OTHER INSTRUCTIONS: _______________________________________________________________________________________________ _____________________________________________________________________________________________________________________________________ °_____________________________________________________________________________________________________________________________________ °_____________________________________________________________________________________________________________________________________ ° °WHEN TO CALL YOUR DOCTOR: °1. Fever over 101.0 °2. Nausea and/or vomiting. °3. Extreme swelling or bruising. °4. Continued bleeding from incision. °5. Increased pain, redness, or drainage from the incision. ° °The clinic staff is available to answer your questions during regular business hours.  Please don’t hesitate to call and ask to speak to one of the nurses for clinical concerns.  If you have a medical emergency, go to the nearest  emergency room or call 911.  A surgeon from [facility] is always on call at the hospital. ° °For further questions, please visit [email or web address]  ° ° °  Post Anesthesia Home Care Instructions ° °Activity: °Get plenty of rest for the remainder of the day. A responsible adult should stay with you for 24 hours following the procedure.  °For the next 24 hours, DO NOT: °-Drive a car °-Operate machinery °-Drink alcoholic beverages °-Take any medication unless instructed by your physician °-Make any legal decisions or sign important papers. ° °Meals: °Start with liquid foods such as gelatin or soup. Progress to regular foods as tolerated. Avoid greasy, spicy, heavy foods. If nausea and/or vomiting occur, drink only clear liquids until the nausea and/or vomiting subsides. Call your physician if vomiting continues. ° °Special Instructions/Symptoms: °Your throat may feel dry or sore from the anesthesia or the breathing tube placed in your throat during surgery. If this causes discomfort, gargle with warm salt water. The discomfort should disappear within 24 hours. ° °

## 2013-08-30 NOTE — Interval H&P Note (Signed)
History and Physical Interval Note:  08/30/2013 11:17 AM  Kristina Pearson  has presented today for surgery, with the diagnosis of DCIS right breast  The goals and the various methods of treatment have been discussed with the patient and family. After consideration of risks, benefits and other options for treatment, the patient has consented to  Procedure(s): RIGHT PARTIAL MASTECTOMY WITH RADIOACTIVE SEED LOCALIZATION (Right) as a surgical intervention .  The patient's history has been reviewed, patient examined today, no change in status, stable for surgery.  I have reviewed the patient's chart and labs.  Questions were answered to the patient's satisfaction.     [name]

## 2013-08-31 ENCOUNTER — Telehealth: Payer: Self-pay | Admitting: *Deleted

## 2013-08-31 MED FILL — Bupivacaine Inj 0.5% w/ Epinephrine 1:200000 (PF): INTRAMUSCULAR | Qty: 30 | Status: AC

## 2013-08-31 NOTE — Telephone Encounter (Signed)
Received call back from patient.  She is doing well post surgery.  She is aware of all her appointments.  Encouraged to her to call with any needs.

## 2013-09-01 ENCOUNTER — Encounter (HOSPITAL_BASED_OUTPATIENT_CLINIC_OR_DEPARTMENT_OTHER): Payer: Self-pay | Admitting: General Surgery

## 2013-09-01 NOTE — Progress Notes (Signed)
Quick Note:  Inform patient of Pathology report,.Tell her that no residual in situ cancer was identified. Clean margins. This is excellent news, demonstrating minimal cancer. Prognosis excellent, assuming radiation therapy. I will discuss with her in detail at first office visit.  hmi ______

## 2013-09-04 ENCOUNTER — Telehealth (INDEPENDENT_AMBULATORY_CARE_PROVIDER_SITE_OTHER): Payer: Self-pay | Admitting: *Deleted

## 2013-09-04 NOTE — Telephone Encounter (Signed)
OK refill # 30  hmi

## 2013-09-04 NOTE — Telephone Encounter (Signed)
Pt has f/u appt 3.25.15.  She only has 9 pain pills left.  She is requesting a refill for Hydrocodone.  Please advise.Anderson Malta

## 2013-09-05 MED ORDER — HYDROCODONE-ACETAMINOPHEN 5-325 MG PO TABS: 2.0000 | ORAL_TABLET | ORAL | Status: DC | PRN

## 2013-09-05 NOTE — Telephone Encounter (Signed)
Per Dr. Dalbert Batman, wrote Hydrocodone 5/325 #30.  Called pt to notify her and she is sending her husband, Mr. Linton Rump to pick it up. I did verify he is on the HIPPA for pt.  Pt was reminded of her appt 09/13/13 and understands and is in agreeance...Amberlie Gaillard

## 2013-09-05 NOTE — Addendum Note (Signed)
Addended by: Myer Peer on: 09/05/2013 01:18 PM   Modules accepted: Orders

## 2013-09-12 ENCOUNTER — Encounter: Payer: Self-pay | Admitting: Radiation Oncology

## 2013-09-12 DIAGNOSIS — C50919 Malignant neoplasm of unspecified site of unspecified female breast: Secondary | ICD-10-CM | POA: Insufficient documentation

## 2013-09-12 NOTE — Progress Notes (Signed)
Location of Breast Cancer: right upper outer  Histology per Pathology Report:  08/30/13 Diagnosis Breast, partial mastectomy, Right partial - LOBULAR NEOPLASIA (ATYPICAL LOBULAR HYPERPLASIA). - NO RESIDUAL CARCINOMA IN SITU IDENTIFIED. - EXTENSIVE BIOPSY SITE CHANGE. - SEE ONCOLOGY TABLE. Microscopic Comment BREAST, IN SITU CARCINOMA Because no residual carcinoma in situ is seen in the resection specimen, the oncology table is based on information from the biopsy ([id number]).  08/16/13 Diagnosis Breast, right, needle core biopsy, lateral - DUCTAL CARCINOMA IN SITU WITH CALCIFICATIONS, SEE COMMENT. - LOBULAR NEOPLASIA (ATYPICAL LOBULAR HYPERPLASIA).  Receptor Status: ER(100%), PR (75%), Her2-neu ()  Did patient present with symptoms (if so, please note symptoms) or was this found on screening mammography?: screening mammogram  Past/Anticipated interventions by surgeon, if any: right partial mastectomy  Past/Anticipated interventions by medical oncology, if any: Chemotherapy Dr Humphrey Rolls: We discussed the role of adjuvant anti-estrogen therapy to help prevent future ER+ breast cancer in the ipsilateral and contralateral breasts. We discussed the different drugs available including tamoxifen. Which would be 20 mg daily for a total of 5 years. We also discussed aromasin 25 mg daily for 5 years. We discussed the side effects of both medications.she will not begin anti-estrogen therapy until after completion of radiation therapy. Next appt w/Dr Humphrey Rolls on 09/19/13  Lymphedema issues, if any:  no   Pain issues, if any:  Has pain in her nipple that she is rating at a 4/10.  Has not had to take norco in 2 days.  SAFETY ISSUES:  Prior radiation? no  Pacemaker/ICD? no  Possible current pregnancy?  no  Is the patient on methotrexate? 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

## 2013-09-13 ENCOUNTER — Ambulatory Visit
Admit: 2013-09-13 | Discharge: 2013-09-13 | Disposition: A | Discharge status: Home / Self Care | Payer: 59 | Attending: Radiation Oncology | Admitting: Radiation Oncology

## 2013-09-13 ENCOUNTER — Encounter (INDEPENDENT_AMBULATORY_CARE_PROVIDER_SITE_OTHER): Payer: Self-pay | Admitting: General Surgery

## 2013-09-13 ENCOUNTER — Encounter: Payer: Self-pay | Admitting: Radiation Oncology

## 2013-09-13 ENCOUNTER — Ambulatory Visit (INDEPENDENT_AMBULATORY_CARE_PROVIDER_SITE_OTHER): Payer: 59 | Admitting: General Surgery

## 2013-09-13 VITALS — BP 110/74 | HR 78 | Temp 98.1°F | Resp 14 | Ht 66.0 in | Wt 142.2 lb

## 2013-09-13 VITALS — BP 106/68 | HR 69 | Temp 98.5°F | Ht 66.0 in | Wt 143.0 lb

## 2013-09-13 DIAGNOSIS — C50919 Malignant neoplasm of unspecified site of unspecified female breast: Secondary | ICD-10-CM

## 2013-09-13 DIAGNOSIS — C50411 Malignant neoplasm of upper-outer quadrant of right female breast: Secondary | ICD-10-CM

## 2013-09-13 DIAGNOSIS — C50419 Malignant neoplasm of upper-outer quadrant of unspecified female breast: Secondary | ICD-10-CM

## 2013-09-13 DIAGNOSIS — D0590 Unspecified type of carcinoma in situ of unspecified breast: Secondary | ICD-10-CM | POA: Insufficient documentation

## 2013-09-13 HISTORY — DX: Malignant neoplasm of unspecified site of unspecified female breast: C50.919

## 2013-09-13 NOTE — Progress Notes (Signed)
CC: Dr. Fanny Skates, Dr. [name], Dr. Marcy Panning  Followup note:  Diagnosis: Low-grade DCIS of the right breast, stage 0 (Tis N0 M0)  History: [name] is a pleasant 58 year old female who is seen today for review and discussion of adjuvant breast radiation in the management of her low-grade DCIS of the right breast.At the time of a screening mammogram on 07/26/2013 she was felt to have new calcifications for which she underwent additional views on 08/09/2013. She was noted to have calcifications over an area of 1 cm within the upper-outer quadrant of the right breast. She underwent a stereotactic core biopsy on 08/15/2013 with a diagnosis of DCIS with calcifications with the in situ carcinoma felt to be grade 1. Breast MR on 08/21/2013 showed biopsy changes within the lateral aspect of the right breast. There were no other lesions are appreciated. On 08/30/2013 she underwent a right partial mastectomy with Dr. Dalbert Batman. On review of her pathology there was lobular neoplasia (atypical lobular hyperplasia) but no residual DCIS. There was extensive biopsy site change seen. Going back to her original new core biopsy the estimated tumor size was felt to be approximately 0.2 cm. As mentioned above, her mammogram of the right breast on 08/09/2013 showed clustered coarse heterogeneous calcifications in the upper-outer quadrant of the right breast measuring approximately 1 cm. Some calcifications were present previously but there were new calcifications seen. The exact dimensions of the new calcifications were not mentioned.   Physical examination: Alert and oriented. Filed Vitals:   09/13/13 1508  BP: 106/68  Pulse: 69  Temp: 98.5 F (36.9 C)   Head and neck examination: Grossly unremarkable. Nodes: Without palpable cervical, supraclavicular, or axillary lymphadenopathy. Chest: Lungs clear. Breasts: There is a partial mastectomy wound along the upper-outer quadrant of the right breast extending  from 9 to 11:00. Her wound is healing well. There is a modest volume defect. No masses are appreciated. Left breast without masses or lesions. Extremities: Without edema.  Impression: Stage 0 (Tis N0 M0) low-grade DCIS of the right breast. I spoke with Dr. Lyndon Code to see if he would review her needle core biopsy and also excisional biopsy to confirm the initial diagnosis of DCIS. I'm not sure if the 0.2 cm DCIS is discordant with her mammogram showing calcifications over an area of 1.0 cm. Some of the calcifications were seen previously. The recently published RTOG [id number] studied  patients that are at "good risk" and may be observed and not receive radiation therapy. The median followup of 7 years is somewhat immature, but there does appear to be diminishing returns for radiation therapy in favorable risk patients with DCIS. I explained to the patient and her husband that the risk for local failure is related to size, nuclear grade, margins, and age. She is a patient who may be observed if she so motivated. I would estimate that there is a less than 1% survival difference between undergoing radiation therapy or being observed. The benefit of radiation therapy may not be seen for at least 5-10 years in terms of improvement of local recurrence. I do recommend that she undergo a repeat right breast mammogram to confirm removal of all suspicious microcalcifications. This will be scheduled for the week of April 13. I'll speak with Dr. Lyndon Code tomorrow regarding her pathology. I will then communicate this information to the patient. She would be a candidate for hypo-fractionated radiation therapy over 3 weeks (16 fractions). We discussed the potential acute and late toxicities of radiation  therapy which is generally well tolerated. In the event of a local failure, she would probably require a mastectomy and thus radiation therapy may reduce the need for her having to undergo a mastectomy in the event of a local  failure.  Plan: As discussed above.  45 minutes was spent face-to-face with the patient, primarily counseling patient and coordinating her care.

## 2013-09-13 NOTE — Patient Instructions (Signed)
You are recovering from your right breast lumpectomy without any obvious surgical complications.  I suspect that Dr. Valere Dross will advise you to have radiation therapy, and I would agree with that.  We have discussed your pathology report which shows that there was no residual DCIS in the breast, so the original area must have been very small.  Return to see Dr. Dalbert Batman in 5 months  Plan bilateral mammograms in one year.

## 2013-09-13 NOTE — Progress Notes (Addendum)
Patient ID: Kristina Pearson, female   DOB: 03-27-1956, 58 y.o.   MRN: [id number] History: This patient underwent right partial mastectomy with radioactive seed localization on 08/30/2013. 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 I've discussed this with the patient and her husband and gave her a copy of the pathology report. She is doing fairly well. A little bit of sensitivity of the nipple and areola but basically feeling better daily. She is going to see Dr. Arloa Koh today ADDENDUM:  Genetic testing basically negative, BRCA1/2 negative.  Exam: Patient looks well. No distress Right lumpectomy site is healing without any signs of seroma, hematoma or infection. There is some volume loss and a little bit of a defect.  Assessment  ductal carcinoma in situ right breast, lateral position, receptor positive. Recovering uneventfully following right partial mastectomy with radioactive seed localization BRCA1/2 negative  Plan: Dr. Valere Dross will decide and manage  the radiation therapy. Dr. Marcy Panning will decide about antiestrogen therapy. Return to see me in 5 months Mammograms in one year   Inez Rosato M. Dalbert Batman, M.D., [facility] General and Minimally invasive Surgery Breast and Colorectal Surgery Office:   [phone] Pager:   [phone]

## 2013-09-13 NOTE — Progress Notes (Signed)
Please see the Nurse Progress Note in the MD Initial Consult Encounter for this patient. 

## 2013-09-14 ENCOUNTER — Telehealth: Payer: Self-pay | Admitting: *Deleted

## 2013-09-14 NOTE — Telephone Encounter (Signed)
Called patient to inform of mammogram  For 10-02-13, spoke with patient and she is aware of this test.

## 2013-09-18 ENCOUNTER — Other Ambulatory Visit: Payer: 59

## 2013-09-18 ENCOUNTER — Ambulatory Visit (HOSPITAL_BASED_OUTPATIENT_CLINIC_OR_DEPARTMENT_OTHER): Payer: 59 | Admitting: Genetic Counselor

## 2013-09-18 ENCOUNTER — Encounter: Payer: Self-pay | Admitting: Genetic Counselor

## 2013-09-18 DIAGNOSIS — C50411 Malignant neoplasm of upper-outer quadrant of right female breast: Secondary | ICD-10-CM

## 2013-09-18 DIAGNOSIS — Z803 Family history of malignant neoplasm of breast: Secondary | ICD-10-CM

## 2013-09-18 DIAGNOSIS — Z800 Family history of malignant neoplasm of digestive organs: Secondary | ICD-10-CM

## 2013-09-18 DIAGNOSIS — IMO0002 Reserved for concepts with insufficient information to code with codable children: Secondary | ICD-10-CM

## 2013-09-18 DIAGNOSIS — Z8041 Family history of malignant neoplasm of ovary: Secondary | ICD-10-CM

## 2013-09-18 DIAGNOSIS — C50419 Malignant neoplasm of upper-outer quadrant of unspecified female breast: Secondary | ICD-10-CM

## 2013-09-18 NOTE — Progress Notes (Signed)
Dr.  Marcy Panning requested a consultation for genetic counseling and risk assessment for Kristina Pearson, a 58 y.o. female, for discussion of her personal history of breast cancer.  She presents to clinic today to discuss the possibility of a genetic predisposition to cancer, and to further clarify her risks, as well as her family members' risks for cancer.   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 She has a diagnosis of arthritis and vitiligo, but does not take medication for either of these.  She has had two colonoscopies and they were negative.   Past Medical History  Diagnosis Date  . Depression   . Allergy   . Hypothyroid   . Sialoadenitis   . GERD (gastroesophageal reflux disease)   . Headache(784.0)   . Arthritis     osteoarthritrs  . ADHD (attention deficit hyperactivity disorder)   . PONV (postoperative nausea and vomiting)   . Wears glasses   . Breast cancer 08/15/13    right lateral upper outer    Past Surgical History  Procedure Laterality Date  . Diagnostic laparoscopy  1982    exp  . Colonoscopy    . Cholecystectomy  1996    lap  . Upper gi endoscopy    . Breast lumpectomy with radioactive seed localization Right 08/30/2013    Procedure: RIGHT PARTIAL MASTECTOMY WITH RADIOACTIVE SEED LOCALIZATION;  Surgeon: [name], MD;  Location: [facility];  Service: General;  Laterality: Right;    History   Social History  . Marital Status: Married    Spouse Name: N/A    Number of Children: 1  . Years of Education: N/A   Social History Main Topics  . Smoking status: Former Smoker    Quit date: 09/14/1978  . Smokeless tobacco: Never Used  . Alcohol Use: 1.0 oz/week    2 drink(s) per week     Comment: occ  . Drug Use: No  . Sexual Activity: None     Comment: menarche age 65, first live birth age 66, menopause age 43, no HRT   Other Topics Concern  . None   Social  History Narrative  . None    REPRODUCTIVE HISTORY AND PERSONAL RISK ASSESSMENT FACTORS: Menarche was at age 89.   postmenopausal Uterus Intact: yes Ovaries Intact: yes G1P1A0, first live birth at age 65  She has not previously undergone treatment for infertility.   Oral Contraceptive use: 0 years   She has not used HRT in the past.    FAMILY HISTORY:  We obtained a detailed, 4-generation family history.  Significant diagnoses are listed below: Family History  Problem Relation Age of Onset  . Arthritis Mother   . Diabetes Mother   . Heart attack Father   . Colon cancer Maternal Uncle     dx in his 73s  . Colon cancer Cousin 22    mother's maternal first cousin  . Breast cancer Cousin 79    BRCA negative; father's maternal first cousin  . Leukemia Maternal Uncle 81    AML  . Melanoma Maternal Uncle 73  . Leukemia Maternal Uncle 23  . Ovarian cancer Cousin 72    maternal cousin  . Ovarian cancer Cousin 24    maternal cousin  . Leukemia Cousin     dx in his 69s; maternal cousin; thought to be the result of taking [place]  . Heart  attack Paternal Uncle   . Colon cancer Cousin 37    maternal second cousin  . Breast cancer Other     maternal great aunt dx in her 59s  . Ovarian cancer Other     maternal grandfather's sister dx in her 14s  . Colon cancer Other     maternal grandfather's sister dx in her 18s  . Colon cancer Other     maternal grandfather's sister dx in her 82s    Patient's maternal ancestors are of [place] and [place] descent, and paternal ancestors are of [place] descent. There is no reported Ashkenazi Jewish ancestry. There is no known consanguinity.  GENETIC COUNSELING ASSESSMENT: Kristina Pearson is a 58 y.o. female with a personal history of breast cancer and family history of colon, ovarian, and breast cancer which somewhat suggestive of a hereditary cancer syndrome and predisposition to cancer. We, therefore, discussed and recommended the  following at today's visit.   DISCUSSION: We reviewed the characteristics, features and inheritance patterns of hereditary cancer syndromes. We also discussed genetic testing, including the appropriate family members to test, the process of testing, insurance coverage and turn-around-time for results. We discussed several cancer syndromes associated with the cancers in her family including BRCA mutations, ATM, CHEK2 and Lynch syndrome.  Based on the cancer in the family we will draw the comprehensive cancer panel.  PLAN: After considering the risks, benefits, and limitations, Kristina Pearson provided informed consent to pursue genetic testing and the blood sample will be sent to [facility] for analysis of the Comprehensive Cancer Panel. We discussed the implications of a positive, negative and/ or variant of uncertain significance genetic test result. Results should be available within approximately 3 weeks' time, at which point they will be disclosed by telephone to Kristina Pearson, as will any additional recommendations warranted by these results. Kristina Pearson will receive a summary of her genetic counseling visit and a copy of her results once available. This information will also be available in Epic. We encouraged Kristina Pearson to remain in contact with cancer genetics annually so that we can continuously update the family history and inform her of any changes in cancer genetics and testing that may be of benefit for her family. Kristina Pearson's questions were answered to her satisfaction today. Our contact information was provided should additional questions or concerns arise.  The patient was seen for a total of 60 minutes, greater than 50% of which was spent face-to-face counseling.  This note will also be sent to the referring provider via the electronic medical record. The patient will be supplied with a summary of this genetic counseling discussion as well as educational information on the discussed  hereditary cancer syndromes following the conclusion of their visit.   Patient was discussed with Dr. Marcy Panning.   _______________________________________________________________________ For Office Staff:  Number of people involved in session: 1 Was an Intern/ student involved with case: yes

## 2013-09-19 ENCOUNTER — Telehealth: Payer: Self-pay | Admitting: Oncology

## 2013-09-19 ENCOUNTER — Ambulatory Visit (HOSPITAL_BASED_OUTPATIENT_CLINIC_OR_DEPARTMENT_OTHER): Payer: 59 | Admitting: Oncology

## 2013-09-19 ENCOUNTER — Encounter: Payer: Self-pay | Admitting: Oncology

## 2013-09-19 VITALS — BP 106/70 | HR 86 | Temp 98.3°F | Resp 18 | Ht 66.0 in | Wt 139.1 lb

## 2013-09-19 DIAGNOSIS — D0590 Unspecified type of carcinoma in situ of unspecified breast: Secondary | ICD-10-CM

## 2013-09-19 DIAGNOSIS — C50411 Malignant neoplasm of upper-outer quadrant of right female breast: Secondary | ICD-10-CM

## 2013-09-19 DIAGNOSIS — N6089 Other benign mammary dysplasias of unspecified breast: Secondary | ICD-10-CM

## 2013-09-19 NOTE — Telephone Encounter (Signed)
S/w the pt regarding her June 2015 appts.

## 2013-09-19 NOTE — Patient Instructions (Signed)

## 2013-09-20 ENCOUNTER — Telehealth: Payer: Self-pay | Admitting: Obstetrics & Gynecology

## 2013-09-20 ENCOUNTER — Other Ambulatory Visit: Payer: Self-pay | Admitting: Family Medicine

## 2013-09-20 NOTE — Telephone Encounter (Signed)
Patient is asking if she needs a pap smear. Patient will begin taking tamoxifen soon. Patient is concerned that tamoxifen causes changing in the uterine lining.

## 2013-09-20 NOTE — Telephone Encounter (Signed)
Dr. Sabra Heck, patient had last pap 07/29/11 with HR HPV-Negative. Has AEX scheduled with you on 11/17/13, with new dx breast CA. Has questions regarding pap and tamoxifen. Should patient plan to keep annual as scheduled?

## 2013-09-21 NOTE — Progress Notes (Signed)
[facility] OFFICE PROGRESS NOTE  Patient Care Team: Laurey Morale, MD as PCP - General Dr. Fanny Skates  Dr. Arloa Koh  DIAGNOSIS: 58 year old female with new diagnosis of DCIS of the right breast  STAGE:  Breast cancer of upper-outer quadrant of right female breast  Primary site: Breast (Right)  Staging method: AJCC 7th Edition  Clinical: Stage 0 (Tis, N0, cM0)  Summary: Stage 0 (Tis, N0, cM0)  SUMMARY OF ONCOLOGIC HISTORY: #1 07/26/2013 patient underwent a screening mammogram and was found to have a new area of calcifications in the right breast. Additional views on 08/09/2013 revealed a 1 cm area within the upper outer quadrant of the right breast. On 08/15/2013 patient had a stereotactic core biopsy performed. 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 This was low grade. 08/21/2013 MRI of the breasts showed biopsy changes no other lesions.  #2 patient is status post lumpectomy of the right breast on 08/30/2013.Marland Kitchen The final pathology reveals no evidence of residual ductal carcinoma in situ. 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  #3 patient has been seen by Dr. Arloa Koh.  Contemplating on the possibility of starting her on radiation therapy.  INTERVAL HISTORY: Jazzman Loughmiller Kolden 57 y.o. female returns for followup visit today. Clinically she seems to be doing well. She has recovered very nicely from the surgery. Her incision site looks terrific. She has a minimal tenderness in the breast. No discharge from the nipple. Remainder of the 10 point review of systems as below.  I have reviewed the past medical history, past surgical history, social history and family history with the patient and they are unchanged from previous note.  ALLERGIES:  has No Known Allergies.  MEDICATIONS:  Current Outpatient Prescriptions  Medication Sig Dispense Refill  . ALPRAZolam (XANAX) 0.5 MG tablet Take 1 tablet (0.5 mg total) by mouth 2  (two) times daily as needed for anxiety.  180 tablet  1  . Calcium Carbonate-Vitamin D (CALCIUM 600+D) 600-400 MG-UNIT per tablet Take 1 tablet by mouth daily.      . celecoxib (CELEBREX) 200 MG capsule Take 1 capsule (200 mg total) by mouth daily.  30 capsule  11  . CRANBERRY EXTRACT PO Take 1 tablet by mouth daily as needed.       Marland Kitchen HYDROcodone-acetaminophen (NORCO/VICODIN) 5-325 MG per tablet Take 1 tablet by mouth every 6 (six) hours as needed for moderate pain.      Marland Kitchen lisdexamfetamine (VYVANSE) 20 MG capsule Take 1 capsule (20 mg total) by mouth daily.  30 capsule  0  . methocarbamol (ROBAXIN) 500 MG tablet Take 1 tablet (500 mg total) by mouth every 6 (six) hours as needed for muscle spasms.  120 tablet  5  . Multiple Vitamin (MULTIVITAMIN) tablet Take 1 tablet by mouth daily.        Marland Kitchen omeprazole (PRILOSEC) 40 MG capsule Take 1 capsule (40 mg total) by mouth 2 (two) times daily as needed.  180 capsule  3  . traZODone (DESYREL) 100 MG tablet TAKE 2 TABLETS EVERY NIGHT  60 tablet  8  . levothyroxine (SYNTHROID, LEVOTHROID) 112 MCG tablet TAKE 1 TABLET DAILY  90 tablet  1  . traZODone (DESYREL) 100 MG tablet TAKE 2 TABLETS AT BEDTIME  180 tablet  3   No current facility-administered medications for this visit.    REVIEW OF SYSTEMS:   Constitutional: Denies fevers, chills or abnormal weight loss Eyes: Denies blurriness of vision Ears, nose,  mouth, throat, and face: Denies mucositis or sore throat Respiratory: Denies cough, dyspnea or wheezes Cardiovascular: Denies palpitation, chest discomfort or lower extremity swelling Gastrointestinal:  Denies nausea, heartburn or change in bowel habits Skin: Denies abnormal skin rashes Lymphatics: Denies new lymphadenopathy or easy bruising Neurological:Denies numbness, tingling or new weaknesses Behavioral/Psych: Mood is stable, no new changes  All other systems were reviewed with the patient and are negative.  PHYSICAL EXAMINATION: ECOG  PERFORMANCE STATUS: 0 - Asymptomatic  Filed Vitals:   09/19/13 1223  BP: 106/70  Pulse: 86  Temp: 98.3 F (36.8 C)  Resp: 18   Filed Weights   09/19/13 1223  Weight: 139 lb 1.6 oz (63.095 kg)    GENERAL:alert, no distress and comfortable SKIN: skin color, texture, turgor are normal, no rashes or significant lesions EYES: normal, Conjunctiva are pink and non-injected, sclera clear OROPHARYNX:no exudate, no erythema and lips, buccal mucosa, and tongue normal  NECK: supple, thyroid normal size, non-tender, without nodularity LYMPH:  no palpable lymphadenopathy in the cervical, axillary or inguinal LUNGS: clear to auscultation and percussion with normal breathing effort HEART: regular rate & rhythm and no murmurs and no lower extremity edema ABDOMEN:abdomen soft, non-tender and normal bowel sounds Musculoskeletal:no cyanosis of digits and no clubbing  NEURO: alert & oriented x 3 with fluent speech, no focal motor/sensory deficits Breast examination: Right breast well-healed surgical scar no areas of ecchymosis and bruising or bleeding; left breast no masses or nipple discharge.  LABORATORY DATA:  I have reviewed the data as listed    Component Value Date/Time   NA 139 08/23/2013 1156   NA 141 11/26/2011 1223   K 4.6 08/23/2013 1156   K 4.7 11/26/2011 1223   CL 107 11/26/2011 1223   CO2 29 08/23/2013 1156   CO2 28 11/26/2011 1223   GLUCOSE 103 08/23/2013 1156   GLUCOSE 84 11/26/2011 1223   BUN 8.7 08/23/2013 1156   BUN 14 11/26/2011 1223   CREATININE 0.9 08/23/2013 1156   CREATININE 1.0 11/26/2011 1223   CALCIUM 9.8 08/23/2013 1156   CALCIUM 9.1 11/26/2011 1223   PROT 6.9 08/23/2013 1156   PROT 6.5 11/26/2011 1223   ALBUMIN 4.1 08/23/2013 1156   ALBUMIN 3.8 11/26/2011 1223   AST 19 08/23/2013 1156   AST 21 11/26/2011 1223   ALT 10 08/23/2013 1156   ALT 17 11/26/2011 1223   ALKPHOS 46 08/23/2013 1156   ALKPHOS 39 11/26/2011 1223   BILITOT 0.45 08/23/2013 1156   BILITOT 0.7 11/26/2011 1223   GFRNONAA 61.50 05/15/2009  1037   GFRAA 75 12/20/2007 1126    No results found for this basename: SPEP, UPEP,  kappa and lambda light chains    Lab Results  Component Value Date   WBC 4.2 08/23/2013   NEUTROABS 2.3 08/23/2013   HGB 12.5 08/23/2013   HCT 37.7 08/23/2013   MCV 96.6 08/23/2013   PLT 208 08/23/2013      Chemistry      Component Value Date/Time   NA 139 08/23/2013 1156   NA 141 11/26/2011 1223   K 4.6 08/23/2013 1156   K 4.7 11/26/2011 1223   CL 107 11/26/2011 1223   CO2 29 08/23/2013 1156   CO2 28 11/26/2011 1223   BUN 8.7 08/23/2013 1156   BUN 14 11/26/2011 1223   CREATININE 0.9 08/23/2013 1156   CREATININE 1.0 11/26/2011 1223      Component Value Date/Time   CALCIUM 9.8 08/23/2013 1156   CALCIUM 9.1 11/26/2011 1223  ALKPHOS 46 08/23/2013 1156   ALKPHOS 39 11/26/2011 1223   AST 19 08/23/2013 1156   AST 21 11/26/2011 1223   ALT 10 08/23/2013 1156   ALT 17 11/26/2011 1223   BILITOT 0.45 08/23/2013 1156   BILITOT 0.7 11/26/2011 1223       RADIOGRAPHIC STUDIES: I have personally reviewed the radiological images as listed and agreed with the findings in the report. No results found.    ASSESSMENT & PLAN:  59 year old female with  #1 stage 0(TisNx) ductal carcinoma in situ, low-grade. Status post right lumpectomy. On final specimen patient had no evidence of residual disease. She was found to have atypical lobular hyperplasia. Went over the pathology with the patient in detail today. We went over it week by word. She does have question whether she should be receiving radiation therapy or not. I reviewed Dr. Charlton Amor notes. Her case will be further discussed with the pathologist. I have discussed the case with the pathologist as well. Patient will need a post lumpectomy mammogram.  #2 as far as further adjuvant therapy is concerned I do think she still needs tamoxifen 20 mg daily. With discussed the risks benefits side effects and complications. She understands and would be for 5 years. I gave her information on it.  #3 patient  will return after she has had further discussions with Dr. Valere Dross. If she does get radiation therapy and I will see her back after the radiation otherwise we will see her sooner.  No orders of the defined types were placed in this encounter.   All questions were answered. The patient knows to call the clinic with any problems, questions or concerns. No barriers to learning was detected. I spent 25 minutes counseling the patient face to face. The total time spent in the appointment was 30 minutes and more than 50% was on counseling and review of test results     Marcy Panning, MD

## 2013-09-22 NOTE — Telephone Encounter (Signed)
Should keep appt and have Pap.  Ok to move back if this ends up being around surgery or any additional treatment time.  Thanks.  Can close encounter when pt informed.

## 2013-09-25 NOTE — Telephone Encounter (Signed)
Spoke with patient. Advised last AEX was on 08/23/12 and last AEX with pap was 07/29/2011. Advised could move AEX with pap to a date that is sooner than 5/29. Patient would like to call and reschedule 5/29 AEX with Pap as she does not currently have access to her schedule. Instructed to ask for [facility] upon return call so that we could schedule.

## 2013-09-25 NOTE — Telephone Encounter (Signed)
Patient returning phone call to schedule aex with pap with Dr.Miller. Requesting appointment between 10-2 as soon as possible. Appointment made for tomorrow at 10:30 with Dr.Miller. Patient agreeable.  Routing to provider for final review. Patient agreeable to disposition. Will close encounter.

## 2013-09-25 NOTE — Telephone Encounter (Signed)
Spoke with patient. Message from [place] given. Patient states that she is going for post op mammogram on 4/13 and will then begin radiation for around 3 weeks before starting tamoxifen. Patient would like to have pap done earlier than 5/29 but needs to be exactly one year for insurance coverage. Last pap not in EPIC. Advised would pull paper chart to see last pap smear date and call back to adjust appointment date. Patient agreeable.

## 2013-09-25 NOTE — Telephone Encounter (Signed)
Left message to call Kaitlyn at [phone]. 

## 2013-09-26 ENCOUNTER — Telehealth: Payer: Self-pay | Admitting: Family Medicine

## 2013-09-26 ENCOUNTER — Ambulatory Visit (INDEPENDENT_AMBULATORY_CARE_PROVIDER_SITE_OTHER): Payer: 59 | Admitting: Obstetrics & Gynecology

## 2013-09-26 ENCOUNTER — Encounter: Payer: Self-pay | Admitting: Obstetrics & Gynecology

## 2013-09-26 VITALS — BP 98/58 | HR 60 | Resp 16 | Ht 66.25 in | Wt 139.4 lb

## 2013-09-26 DIAGNOSIS — Z01419 Encounter for gynecological examination (general) (routine) without abnormal findings: Secondary | ICD-10-CM

## 2013-09-26 DIAGNOSIS — Z124 Encounter for screening for malignant neoplasm of cervix: Secondary | ICD-10-CM

## 2013-09-26 DIAGNOSIS — Z0000 Encounter for general adult medical examination without abnormal findings: Secondary | ICD-10-CM

## 2013-09-26 DIAGNOSIS — Z8659 Personal history of other mental and behavioral disorders: Secondary | ICD-10-CM

## 2013-09-26 MED ORDER — ALPRAZOLAM 0.5 MG PO TABS: 0.5000 mg | ORAL_TABLET | Freq: Two times a day (BID) | ORAL | Status: DC | PRN

## 2013-09-26 NOTE — Progress Notes (Signed)
58 y.o. G2P1 MarriedCaucasianF here for annual exam.  Diagnosed with DCIS earlier this year in needle biopsy.  Lumpectomy showed lobar hyperplasia.  Now, waiting on genetic testing.  Blood work done about two weeks ago.  Healing well from procedure.  Dr. Valere Dross is not sure at this time if she needs radiation.  Awaiting outside review of pathology.    Pt has mail order Xanax rx on the way.  I do not write this for her.  She won't have it for about another week and asks for 10 to hold her over.  Patient's last menstrual period was 06/22/2006.          Sexually active: yes  The current method of family planning is none.    Exercising: yes  Swimming, walking Smoker:  Former  Health Maintenance: Pap:  07/2011 - Neg HR HPV : Neg History of abnormal Pap:  yes MMG:  07/2013 - Breast Cancer Colonoscopy:  2007 - Every 10 years BMD:   07/2013 TDaP:  08/2004 Screening Labs: 6/14, Hb today: 12.5, Urine today: n/a   reports that she quit smoking about 35 years ago. She has never used smokeless tobacco. She reports that she drinks about 1.0 ounces of alcohol per week. She reports that she does not use illicit drugs.  Past Medical History  Diagnosis Date  . Depression   . Allergy   . Hypothyroid   . Sialoadenitis   . GERD (gastroesophageal reflux disease)   . Headache(784.0)   . Arthritis     osteoarthritrs  . ADHD (attention deficit hyperactivity disorder)     ADD  . PONV (postoperative nausea and vomiting)   . Wears glasses   . Breast cancer 08/15/13    right lateral upper outer    Past Surgical History  Procedure Laterality Date  . Diagnostic laparoscopy  1982    exp  . Colonoscopy    . Cholecystectomy  1996    lap  . Upper gi endoscopy    . Breast lumpectomy with radioactive seed localization Right 08/30/2013    Procedure: RIGHT PARTIAL MASTECTOMY WITH RADIOACTIVE SEED LOCALIZATION;  Surgeon: [name], MD;  Location: [facility];  Service: General;  Laterality:  Right;    Current Outpatient Prescriptions  Medication Sig Dispense Refill  . ALPRAZolam (XANAX) 0.5 MG tablet Take 1 tablet (0.5 mg total) by mouth 2 (two) times daily as needed for anxiety.  180 tablet  1  . Calcium Carbonate-Vitamin D (CALCIUM 600+D) 600-400 MG-UNIT per tablet Take 1 tablet by mouth daily. And Vitamin K      . celecoxib (CELEBREX) 200 MG capsule Take 1 capsule (200 mg total) by mouth daily.  30 capsule  11  . CRANBERRY EXTRACT PO Take 1 tablet by mouth daily as needed.       Marland Kitchen HYDROcodone-acetaminophen (NORCO/VICODIN) 5-325 MG per tablet Take 1 tablet by mouth every 6 (six) hours as needed for moderate pain.      Marland Kitchen levothyroxine (SYNTHROID, LEVOTHROID) 112 MCG tablet TAKE 1 TABLET DAILY  90 tablet  1  . lisdexamfetamine (VYVANSE) 20 MG capsule Take 1 capsule (20 mg total) by mouth daily.  30 capsule  0  . methocarbamol (ROBAXIN) 500 MG tablet Take 500 mg by mouth at bedtime as needed for muscle spasms.      . Multiple Vitamin (MULTIVITAMIN) tablet Take 1 tablet by mouth daily.        Marland Kitchen omeprazole (PRILOSEC) 40 MG capsule Take 1 capsule (40  mg total) by mouth 2 (two) times daily as needed.  180 capsule  3  . traZODone (DESYREL) 100 MG tablet TAKE 2 TABLETS EVERY NIGHT  60 tablet  8   No current facility-administered medications for this visit.    Family History  Problem Relation Age of Onset  . Arthritis Mother   . Diabetes Mother   . Heart attack Father   . Colon cancer Maternal Uncle     dx in his 74s  . Colon cancer Cousin 100    mother's maternal first cousin  . Breast cancer Cousin 24    BRCA negative; father's maternal first cousin  . Leukemia Maternal Uncle 81    AML  . Melanoma Maternal Uncle 73  . Leukemia Maternal Uncle 51  . Ovarian cancer Cousin 67    maternal cousin  . Ovarian cancer Cousin 24    maternal cousin  . Leukemia Cousin     dx in his 45s; maternal cousin; thought to be the result of taking [place]  . Heart attack Paternal Uncle   .  Colon cancer Cousin 55    maternal second cousin  . Breast cancer Other     maternal great aunt dx in her 57s  . Ovarian cancer Other     maternal grandfather's sister dx in her 56s  . Colon cancer Other     maternal grandfather's sister dx in her 8s  . Colon cancer Other     maternal grandfather's sister dx in her 46s  . Other Daughter     dermoid tumor    ROS:  Pertinent items are noted in HPI.  Otherwise, a comprehensive ROS was negative.  Exam:   BP 98/58  Pulse 60  Resp 16  Ht 5' 6.25" (1.683 m)  Wt 139 lb 6.4 oz (63.231 kg)  BMI 22.32 kg/m2  LMP 06/22/2006  Weight change: -18lbs  Height: 5' 6.25" (168.3 cm)  Ht Readings from Last 3 Encounters:  09/26/13 5' 6.25" (1.683 m)  09/19/13 $RemoveB'5\' 6"'NUbAuIRk$  (1.676 m)  09/13/13 $RemoveB'5\' 6"'FDvJDTGy$  (1.676 m)    General appearance: alert, cooperative and appears stated age Head: Normocephalic, without obvious abnormality, atraumatic Neck: no adenopathy, supple, symmetrical, trachea midline and thyroid normal to inspection and palpation Lungs: clear to auscultation bilaterally Breasts: normal appearance, no masses or tenderness, positive findings: except for healing scar with minimal bruising on right breast Heart: regular rate and rhythm Abdomen: soft, non-tender; bowel sounds normal; no masses,  no organomegaly Extremities: extremities normal, atraumatic, no cyanosis or edema Skin: Skin color, texture, turgor normal. No rashes or lesions Lymph nodes: Cervical, supraclavicular, and axillary nodes normal. No abnormal inguinal nodes palpated Neurologic: Grossly normal   Pelvic: External genitalia:  no lesions              Urethra:  normal appearing urethra with no masses, tenderness or lesions              Bartholins and Skenes: normal                 Vagina: normal appearing vagina with normal color and discharge, no lesions              Cervix: absent              Pap taken: no Bimanual Exam:  Uterus:  normal size, contour, position, consistency,  mobility, non-tender              Adnexa: normal adnexa and no mass,  fullness, tenderness               Rectovaginal: Confirms               Anus:  normal sphincter tone, no lesions  A:  Well Woman with normal exam H/O DCIS, Lobar hyperplasia s/p lumpectomy.  Having follow up MMG next week.   Vaginal atrophy DDD/back injury Fibromyalgia Spinal stenosis Vitiligo  Purposeful weight loss Anxiety  P:   Mammogram scheduled. pap smear today Xanax 0.52m prn #30/0RF return annually or prn  An After Visit Summary was printed and given to the patient.

## 2013-09-26 NOTE — Telephone Encounter (Signed)
I spoke with pt and she has enough medication to last about 2 weeks. We can send in next week if approved. I explained that Dr. Sarajane Jews will be out of office until next week 10/02/13.

## 2013-09-26 NOTE — Telephone Encounter (Signed)
[facility] [place] is requesting re-fill on ALPRAZolam (XANAX) 0.5 MG tablet

## 2013-09-27 LAB — IPS PAP SMEAR ONLY

## 2013-09-29 ENCOUNTER — Encounter: Payer: Self-pay | Admitting: Genetic Counselor

## 2013-09-29 NOTE — Progress Notes (Signed)
This is a brief note to document results disclosure of genetic test results to Ms. Janczak. She was seen for genetic counseling by Roma Kayser on 09/18/13 and results were available. Disclosing in Karen's absence to Ms. Summer.  GENETIC TESTING: At the time of Ms. Norman's visit, she was recommended to pursue genetic testing of multiple genes on the [facility] Comprehensive Cancer Panel. This test, which included sequencing and deletion/duplication analysis did not reveal any clearly harmful mutations. The genes tested were APC, ATM, AXIN2, BARD1, BMPRIA, BRCA1, BRCA2, BRIP1, CDH1, CDK4, CDKN2A, CHEK2, EPCAM, FANCC, MLH1, MSH2, MSH6, MUTYH, NBN, PALB2, PMS2, PTEN, RAD51C, SMAD4, STK11, TP53, VHL, and XRCC2.  Genetic testing did; however, detect a Variant of Unknown Significance in the PTEN gene called c.1115A>G (p.Asn372Ser). At this time, it is unknown if this variant is associated with increased cancer risk or if this is a normal finding. With time, we suspect the lab will determine the significance of this variant, if any. If we do learn more about it, we will try to contact Ms. Maruyama to discuss it further. However, it is important t stay in touch with [place] periodically and keep the address and phone number up to date.  We emphasized that this VUS does not explain her breast cancer nor her family history. Please refer to Santiago Glad Powell's note for detailed family history. She understands that unaffected family members should not be tested for this VUS. If she has relatives with cancer diagnosed at young ages, they are recommended to see a genetic counselor to discuss appropriate genetic testing.  CANCER SCREENING: We will defer Ms. Tant's screenings to her physician as she is newly diagnosed. She previously reported that she has had normal colonoscopic screenings.  Cancer genetics is a rapidly advancing field and it is possible that new genetic tests will be appropriate for Ms. Holbein in the future. We encouraged her to remain  in contact with [place] on an annual basis so we can update her personal and family histories, and let her know of advances in cancer genetics that may benefit the family. Our contact number was provided. Ms. Ogata's questions were answered to her satisfaction today, and she knows she is welcome to call anytime with additional questions.    Steele Berg, MS, [place] Certified Genetic Counseor phone: [phone] [email or web address]

## 2013-10-02 ENCOUNTER — Ambulatory Visit
Admission: RE | Admit: 2013-10-02 | Discharge: 2013-10-02 | Disposition: A | Discharge status: Home / Self Care | Payer: 59 | Source: Ambulatory Visit | Attending: Radiation Oncology | Admitting: Radiation Oncology

## 2013-10-02 ENCOUNTER — Telehealth (INDEPENDENT_AMBULATORY_CARE_PROVIDER_SITE_OTHER): Payer: Self-pay

## 2013-10-02 DIAGNOSIS — C50411 Malignant neoplasm of upper-outer quadrant of right female breast: Secondary | ICD-10-CM

## 2013-10-02 NOTE — Telephone Encounter (Signed)
I will see her any time. If she is concerned do not delay.  hmi

## 2013-10-02 NOTE — Telephone Encounter (Signed)
Patient states  breast ca ctr   advise  DR. Dalbert Batman That patient has a white line from her rt breast to her hip and it is sore to the touch. I do not see a P/O appt until the end of the month . Please advise

## 2013-10-03 ENCOUNTER — Encounter (INDEPENDENT_AMBULATORY_CARE_PROVIDER_SITE_OTHER): Payer: 59 | Admitting: General Surgery

## 2013-10-03 ENCOUNTER — Encounter (INDEPENDENT_AMBULATORY_CARE_PROVIDER_SITE_OTHER): Payer: Self-pay | Admitting: General Surgery

## 2013-10-03 ENCOUNTER — Ambulatory Visit (INDEPENDENT_AMBULATORY_CARE_PROVIDER_SITE_OTHER): Payer: 59 | Admitting: General Surgery

## 2013-10-03 VITALS — BP 108/62 | HR 84 | Temp 97.7°F | Resp 12 | Ht 66.0 in | Wt 139.4 lb

## 2013-10-03 DIAGNOSIS — C50411 Malignant neoplasm of upper-outer quadrant of right female breast: Secondary | ICD-10-CM

## 2013-10-03 DIAGNOSIS — C50419 Malignant neoplasm of upper-outer quadrant of unspecified female breast: Secondary | ICD-10-CM

## 2013-10-03 MED ORDER — ALPRAZOLAM 0.5 MG PO TABS: 0.5000 mg | ORAL_TABLET | Freq: Two times a day (BID) | ORAL | Status: DC | PRN

## 2013-10-03 NOTE — Progress Notes (Signed)
Patient ID: Kristina Pearson, female   DOB: 1955-08-02, 58 y.o.   MRN: [id number] History:  Patient returns to see me with a 1-2 week history of pain and a white line extending from her right lateral breast down to her right fascia. She has not noticed any skin discoloration. She has not noticed any palpable mass. No trauma. She says the breast incision has healed normally. In terms of her cancer treatment, Dr. Valere Dross has questioned whether she has DCIS or simply atypical ductal hyperplasia. He has had her original core biopsies sent out for further pathologic opinion Dr. Humphrey Rolls has stated that she should be placed on tamoxifen, if DCIS is confirmed. Radiation therapy is on hold. She asked for a refill of hydrocodone. She has prescriptions for Robaxin.  Postlumpectomy mammogram shows no residual calcifications.  This patient underwent right partial mastectomy with radioactive seed localization on 08/30/2013. 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 I've discussed this with the patient and her husband and gave her a copy of the pathology report.   ADDENDUM: Genetic testing basically negative, BRCA1/2 negative.   Exam:  Patient looks well. No distress  When she raises her right arm up, she points to a very faint line in her skin extending from the lateral right breast down to the anterior superior iliac . This is really hard to see and may simply be tissues placed on stretch. Is not visible her arms at her side. It is tender along this area however. There are no thrombosed veins. There is no erythema or skin change. There's no obvious muscle spasm. Right lumpectomy site is healing without any signs of seroma, hematoma or infection. There is some volume loss and a little bit of a defect.  There is no axillary mass or tenderness.  Assessment  Linear area of tenderness  right chest and abdominal wall of uncertain etiology. This certainly does not appear to be thrombotic and does not appear to be infectious. Wonder about muscle spasm ductal carcinoma in situ right breast, lateral position, receptor positive. Recovering uneventfully following right partial mastectomy with radioactive seed localization  Outside second opinion regarding true pathology pending BRCA1/2 negative    Plan:  Continue Robaxin. Ibuprofen 4 tablets 4 times a day with food for 2 or 3 days If pain persists, discussed with her PCP as I do not think this is a surgical problem If the skin turned red, or she develops a palpable mass, return to see me Dr. Valere Dross will decide about radiation therapy Dr. Marcy Panning will decide about antiestrogen therapy.  Return to see me in 5 months  Mammograms in one year    Kristina Pearson M. Dalbert Batman, M.D., [facility]  General and Minimally invasive Surgery  Breast and Colorectal Surgery  Office: [phone]  Pager: [phone]

## 2013-10-03 NOTE — Telephone Encounter (Signed)
Send in #[phone] with one refill to [facility]. She also needs testing and a contract

## 2013-10-03 NOTE — Telephone Encounter (Signed)
I printed script and faxed it.

## 2013-10-03 NOTE — Patient Instructions (Signed)
Examination of your right arm, right axilla, right breast, right chest wall, and abdominal wall did not reveal any abnormalities other than a little bit of tenderness.  The cause of your pain is unclear. It might be muscle spasm. There is no sign of any thrombosed vein. There is no sign of any infection.  In the short-term, it is okay to stretch your muscles.  In the short-term, take four  ibuprofen every 6 hours with food and do this for about 3 days to see if that helps. This is an anti-inflammatory medication  At your request, you have been given a prescription for hydrocodone  If the appearance of the tender area changes in any way, please call me and I'll be happy to see you. If the pain persists and there is there is no change he will need to followup with Dr. Sarajane Jews  Return to see Dr. Dalbert Batman in 5 months.

## 2013-10-05 ENCOUNTER — Telehealth: Payer: Self-pay | Admitting: Family Medicine

## 2013-10-05 NOTE — Telephone Encounter (Signed)
Pt had breast surgery on march 11. Pt now has a painful line going down the base of her breast to her hip bone. Line is painful, indented and pt states you can "see" it.  DRr Dalbert Batman states he does not think its from her breast surgery. This has been going on a week. Same pain no worse. Pt would like appt tomorrow  pls advise if ok to sched.

## 2013-10-05 NOTE — Telephone Encounter (Signed)
Okay per Dr. Fry to schedule. 

## 2013-10-06 ENCOUNTER — Ambulatory Visit (INDEPENDENT_AMBULATORY_CARE_PROVIDER_SITE_OTHER): Payer: 59 | Admitting: Family Medicine

## 2013-10-06 ENCOUNTER — Encounter: Payer: Self-pay | Admitting: Family Medicine

## 2013-10-06 VITALS — BP 108/66 | HR 94 | Temp 98.5°F | Ht 66.0 in | Wt 140.0 lb

## 2013-10-06 DIAGNOSIS — M1990 Unspecified osteoarthritis, unspecified site: Secondary | ICD-10-CM

## 2013-10-06 DIAGNOSIS — C50419 Malignant neoplasm of upper-outer quadrant of unspecified female breast: Secondary | ICD-10-CM

## 2013-10-06 DIAGNOSIS — I809 Phlebitis and thrombophlebitis of unspecified site: Secondary | ICD-10-CM | POA: Insufficient documentation

## 2013-10-06 DIAGNOSIS — C50411 Malignant neoplasm of upper-outer quadrant of right female breast: Secondary | ICD-10-CM

## 2013-10-06 MED ORDER — HYDROCODONE-ACETAMINOPHEN 5-325 MG PO TABS: 1.0000 | ORAL_TABLET | Freq: Four times a day (QID) | ORAL | Status: DC | PRN

## 2013-10-06 MED ORDER — CELECOXIB 200 MG PO CAPS: 200.0000 mg | ORAL_CAPSULE | Freq: Two times a day (BID) | ORAL | Status: DC

## 2013-10-06 NOTE — Telephone Encounter (Signed)
Done on thurs.4/16

## 2013-10-06 NOTE — Progress Notes (Signed)
   Subjective:    Patient ID: Kristina Pearson, female    DOB: 02/24/1956, 58 y.o.   MRN: [id number]  HPI Here to check a tender line along the right side which has been present for several weeks. She had a partial mastectomy in March, and the surgical site has been healing nicely. She says a visible white line was present from the right inferior breast down to the superior pelvis. The line is less visible now but still tender. Also she has had increases joint pains lately. She takes Celebrex 200 mg once a day.    Review of Systems  Constitutional: Negative.   Genitourinary: Positive for flank pain.  Musculoskeletal: Positive for arthralgias.       Objective:   Physical Exam  Constitutional: She appears well-developed and well-nourished. No distress.  Abdominal:  There is a faintly visible line from the right breast down the right side to the pelvic brim which is mobile under the skin and mildly tender           Assessment & Plan:  She has an inflamed superficial vein on the right side, probably as a result of her surgery. I do not think there is any thrombus involved. Suggested she use warm compresses over this area. It should resolve on its own over the next few weeks. We will increase the Celebrex to BID. Add Norco prn.

## 2013-10-06 NOTE — Progress Notes (Signed)
Pre visit review using our clinic review tool, if applicable. No additional management support is needed unless otherwise documented below in the visit note. 

## 2013-10-09 ENCOUNTER — Encounter: Payer: Self-pay | Admitting: Radiation Oncology

## 2013-10-09 NOTE — Progress Notes (Signed)
CC: Dr. Delma Freeze, Dr. Fanny Skates  I had Dr. Lyndon Code review the patient's pathology, and he had 6 pathologist review her case, four of which believe that she indeed has DCIS and 2 feeling that she had hyperplasia but not enough evidence for the diagnosis of DCIS. Dr. Donato Heinz, at my request, and the patient's request, had the slides reviewed at [facility], and they make a diagnosis of DCIS. I spoke with the patient by telephone to explain to her that there is a difference of opinion regarding her diagnosis, but the majority of the pathologists who have seen her slides feel that she does have DCIS. In view of the focal nature of her low-grade DCIS, she would be expected to have a low risk for local recurrence even  without radiation therapy. At worst, she would have perhaps no greater than a 5%-8% long term  risk for local failure with there being breast no more than a 3-4 % improvement local control with no survival benefit from breast irradiation. On other hand, if she does have a local failure, then she would require a mastectomy because of her small breast size. I told her that there is no right or wrong decision, and that  she could be followed without radiation therapy. She wants to be proactive in doing everything that is certainly reasonable, and therefore wants to proceed with breast irradiation. I will have her return this week for simulation/treatment planning. She will receive 3 weeks of hypofractionated radiation therapy. Of note is that she was recently diagnosed with phlebitis and this is being managed conservatively.

## 2013-10-10 ENCOUNTER — Telehealth (INDEPENDENT_AMBULATORY_CARE_PROVIDER_SITE_OTHER): Payer: Self-pay

## 2013-10-10 NOTE — Telephone Encounter (Signed)
Patient states she is feeling much better she saw DR. Valere Dross ,and her PCP that prescribed anti inflammatory which did help. Advised to call if further concerns

## 2013-10-11 ENCOUNTER — Ambulatory Visit
Admission: RE | Admit: 2013-10-11 | Discharge: 2013-10-11 | Disposition: A | Discharge status: Home / Self Care | Payer: 59 | Source: Ambulatory Visit | Attending: Radiation Oncology | Admitting: Radiation Oncology

## 2013-10-11 DIAGNOSIS — C50411 Malignant neoplasm of upper-outer quadrant of right female breast: Secondary | ICD-10-CM

## 2013-10-11 DIAGNOSIS — C50419 Malignant neoplasm of upper-outer quadrant of unspecified female breast: Secondary | ICD-10-CM | POA: Insufficient documentation

## 2013-10-11 DIAGNOSIS — Z510 Encounter for antineoplastic radiation therapy: Secondary | ICD-10-CM | POA: Insufficient documentation

## 2013-10-11 NOTE — Progress Notes (Signed)
Complex simulation/treatment planning note: The patient was taken to the CT simulator. A custom vac lock was constructed on a custom breast board for immobilization. Her right breast field borders were marked with radiopaque wires along with her partial mastectomy scar. She was then scanned. I contoured her tumor bed. The CT data set was sent to the planning system where she was set up to medial and lateral right breast tangents. I'm prescribing 4256 cGy to be delivered in 16 sessions. She is now ready for 3-D simulation. No boost.

## 2013-10-12 ENCOUNTER — Encounter: Payer: Self-pay | Admitting: Radiation Oncology

## 2013-10-12 NOTE — Progress Notes (Signed)
3-D simulation note: The patient completed 3-D simulation for treatment to her right breast. She is setup to medial and lateral right breast tangents. She has 2 field in field multileaf collimators medially, and 1 field and field multileaf collimator laterally for a total of 5 complex treatment devices. I am prescribing 4256 cGy in 16 sessions utilizing 6 MV photons. Dose volume histograms were obtained for the target structure/tumor bed in addition to avoidance structures including the lungs and heart. We met our departmental guidelines. I requesting daily AlignRT/optical guidance.

## 2013-10-18 ENCOUNTER — Ambulatory Visit
Admission: RE | Admit: 2013-10-18 | Discharge: 2013-10-18 | Disposition: A | Discharge status: Home / Self Care | Payer: 59 | Source: Ambulatory Visit | Attending: Radiation Oncology | Admitting: Radiation Oncology

## 2013-10-18 DIAGNOSIS — C50411 Malignant neoplasm of upper-outer quadrant of right female breast: Secondary | ICD-10-CM

## 2013-10-18 NOTE — Progress Notes (Signed)
Simulation verification note: The patient underwent simulation verification for treatment to her right breast. Her isocenter is in good position and the multileaf collimators contoured the treatment volume appropriately.

## 2013-10-19 ENCOUNTER — Ambulatory Visit
Admission: RE | Admit: 2013-10-19 | Discharge: 2013-10-19 | Disposition: A | Discharge status: Home / Self Care | Payer: 59 | Source: Ambulatory Visit | Attending: Radiation Oncology | Admitting: Radiation Oncology

## 2013-10-19 ENCOUNTER — Ambulatory Visit: Payer: 59

## 2013-10-19 DIAGNOSIS — C50411 Malignant neoplasm of upper-outer quadrant of right female breast: Secondary | ICD-10-CM

## 2013-10-19 MED ORDER — RADIAPLEXRX EX GEL
Freq: Once | CUTANEOUS | Status: AC
  Administered 2013-10-19: 15:00:00 via TOPICAL

## 2013-10-19 MED ORDER — ALRA NON-METALLIC DEODORANT (RAD-ONC)
1.0000 "application " | Freq: Once | TOPICAL | Status: AC
  Administered 2013-10-19: 1 via TOPICAL

## 2013-10-19 NOTE — Progress Notes (Signed)
Post sim ed completed w/patient. Gave her "Radiation and You' booklet w/all pertinent information marked and discussed, re : fatigue, skin irritation/care, nutrition, pain. Gave pt Radiaplex, Alra w/instructions for proper use. Pt verbalized understanding. Pt states she did not sleep well last night due to right shoulder, axilla pain. She states the pain radiated up her neck, around to her scapula and down her back on right side. Pt has history of back injury and chronic pain in her back. She takes Celebrex, Robaxin nightly. She states she took Hydrocodone and Robaxin at 3 am and fell asleep. She states that she had right axillary pain prior to her lumpectomy. She states she wants Dr Valere Dross to be aware. Informed pt will notify Dr Valere Dross, and she will see him next Monday and can discuss. Pt states she "is not alarmed by this pain", but wanted the dr to know. She also has remote history of bursitis in her right shoulder and received an injection. Suggested to pt she stretch her arms up in the treatment position during the day, with support behind her shoulders to help this area be accustomed to that position. Pt verbalized understanding. In basket sent to Dr Valere Dross.

## 2013-10-20 ENCOUNTER — Ambulatory Visit: Payer: 59

## 2013-10-20 ENCOUNTER — Ambulatory Visit
Admission: RE | Admit: 2013-10-20 | Discharge: 2013-10-20 | Disposition: A | Discharge status: Home / Self Care | Payer: 59 | Source: Ambulatory Visit | Attending: Radiation Oncology | Admitting: Radiation Oncology

## 2013-10-23 ENCOUNTER — Encounter: Payer: Self-pay | Admitting: Radiation Oncology

## 2013-10-23 ENCOUNTER — Ambulatory Visit: Payer: 59

## 2013-10-23 ENCOUNTER — Ambulatory Visit
Admission: RE | Admit: 2013-10-23 | Discharge: 2013-10-23 | Disposition: A | Discharge status: Home / Self Care | Payer: 59 | Source: Ambulatory Visit | Attending: Radiation Oncology | Admitting: Radiation Oncology

## 2013-10-23 VITALS — BP 112/64 | HR 79 | Temp 98.9°F | Resp 20 | Wt 140.3 lb

## 2013-10-23 DIAGNOSIS — C50411 Malignant neoplasm of upper-outer quadrant of right female breast: Secondary | ICD-10-CM

## 2013-10-23 NOTE — Progress Notes (Signed)
Weekly Management Note:  Site: Right breast Current Dose:  798  cGy Projected Dose: 4256  cGy  Narrative: The patient is seen today for routine under treatment assessment. CBCT/MVCT images/port films were reviewed. The chart was reviewed.   She is without new complaints today. Pruritus or discomfort is improved. She does take Celebrex daily. She uses Radioplex gel.  Physical Examination:  Filed Vitals:   10/23/13 1447  BP: 112/64  Pulse: 79  Temp: 98.9 F (37.2 C)  Resp: 20  .  Weight: 140 lb 4.8 oz (63.64 kg). No significant skin changes.  Impression: Tolerating radiation therapy well.  Plan: Continue radiation therapy as planned.

## 2013-10-23 NOTE — Progress Notes (Signed)
Pt denies pain today, fatigue, loss of appetite. She states she still has pain in her right upper chest and right axilla, but she states "it may be less". She states her right breast is not sore anymore from surgery. She is applying Radiaplex to right breast treatment area.

## 2013-10-24 ENCOUNTER — Ambulatory Visit: Payer: 59

## 2013-10-24 ENCOUNTER — Ambulatory Visit
Admission: RE | Admit: 2013-10-24 | Discharge: 2013-10-24 | Disposition: A | Discharge status: Home / Self Care | Payer: 59 | Source: Ambulatory Visit | Attending: Radiation Oncology | Admitting: Radiation Oncology

## 2013-10-25 ENCOUNTER — Ambulatory Visit
Admission: RE | Admit: 2013-10-25 | Discharge: 2013-10-25 | Disposition: A | Discharge status: Home / Self Care | Payer: 59 | Source: Ambulatory Visit | Attending: Radiation Oncology | Admitting: Radiation Oncology

## 2013-10-25 ENCOUNTER — Ambulatory Visit: Payer: 59

## 2013-10-26 ENCOUNTER — Ambulatory Visit
Admission: RE | Admit: 2013-10-26 | Discharge: 2013-10-26 | Disposition: A | Discharge status: Home / Self Care | Payer: 59 | Source: Ambulatory Visit | Attending: Radiation Oncology | Admitting: Radiation Oncology

## 2013-10-26 ENCOUNTER — Ambulatory Visit: Payer: 59

## 2013-10-27 ENCOUNTER — Ambulatory Visit
Admission: RE | Admit: 2013-10-27 | Discharge: 2013-10-27 | Disposition: A | Discharge status: Home / Self Care | Payer: 59 | Source: Ambulatory Visit | Attending: Radiation Oncology | Admitting: Radiation Oncology

## 2013-10-27 ENCOUNTER — Ambulatory Visit: Payer: 59

## 2013-10-30 ENCOUNTER — Encounter: Payer: Self-pay | Admitting: Radiation Oncology

## 2013-10-30 ENCOUNTER — Ambulatory Visit
Admission: RE | Admit: 2013-10-30 | Discharge: 2013-10-30 | Disposition: A | Discharge status: Home / Self Care | Payer: 59 | Source: Ambulatory Visit | Attending: Radiation Oncology | Admitting: Radiation Oncology

## 2013-10-30 ENCOUNTER — Ambulatory Visit: Payer: 59

## 2013-10-30 VITALS — BP 117/78 | HR 66 | Temp 98.6°F | Resp 20 | Wt 139.6 lb

## 2013-10-30 DIAGNOSIS — C50411 Malignant neoplasm of upper-outer quadrant of right female breast: Secondary | ICD-10-CM

## 2013-10-30 NOTE — Progress Notes (Signed)
Pt states she has had slight discomfort in her left breast/axilla "similar to when your period is going to start". She also reports occasional sharp pains in right breast. She is applying Radiaplex to right breast, denies skin changes at this time. She is fatigued.

## 2013-10-30 NOTE — Progress Notes (Signed)
Weekly Management Note:  Site: Right breast Current Dose:  2128  cGy Projected Dose: 4256  cGy  Narrative: The patient is seen today for routine under treatment assessment. CBCT/MVCT images/port films were reviewed. The chart was reviewed.   She is without complaints today. She uses Radioplex gel. Breast discomfort is minimal.  Physical Examination:  Filed Vitals:   10/30/13 1448  BP: 117/78  Pulse: 66  Temp: 98.6 F (37 C)  Resp: 20  .  Weight: 139 lb 9.6 oz (63.322 kg). There is slight erythema along the right breast with no areas of desquamation.  Impression: Tolerating radiation therapy well.  Plan: Continue radiation therapy as planned.

## 2013-10-31 ENCOUNTER — Ambulatory Visit: Payer: 59

## 2013-10-31 ENCOUNTER — Ambulatory Visit
Admission: RE | Admit: 2013-10-31 | Discharge: 2013-10-31 | Disposition: A | Discharge status: Home / Self Care | Payer: 59 | Source: Ambulatory Visit | Attending: Radiation Oncology | Admitting: Radiation Oncology

## 2013-11-01 ENCOUNTER — Ambulatory Visit
Admission: RE | Admit: 2013-11-01 | Discharge: 2013-11-01 | Disposition: A | Discharge status: Home / Self Care | Payer: 59 | Source: Ambulatory Visit | Attending: Radiation Oncology | Admitting: Radiation Oncology

## 2013-11-01 ENCOUNTER — Ambulatory Visit: Payer: 59

## 2013-11-02 ENCOUNTER — Ambulatory Visit
Admission: RE | Admit: 2013-11-02 | Discharge: 2013-11-02 | Disposition: A | Discharge status: Home / Self Care | Payer: 59 | Source: Ambulatory Visit | Attending: Radiation Oncology | Admitting: Radiation Oncology

## 2013-11-02 ENCOUNTER — Ambulatory Visit: Payer: 59

## 2013-11-03 ENCOUNTER — Ambulatory Visit
Admission: RE | Admit: 2013-11-03 | Discharge: 2013-11-03 | Disposition: A | Discharge status: Home / Self Care | Payer: 59 | Source: Ambulatory Visit | Attending: Radiation Oncology | Admitting: Radiation Oncology

## 2013-11-03 ENCOUNTER — Ambulatory Visit: Payer: 59

## 2013-11-06 ENCOUNTER — Encounter: Payer: Self-pay | Admitting: Radiation Oncology

## 2013-11-06 ENCOUNTER — Ambulatory Visit
Admission: RE | Admit: 2013-11-06 | Discharge: 2013-11-06 | Disposition: A | Discharge status: Home / Self Care | Payer: 59 | Source: Ambulatory Visit | Attending: Radiation Oncology | Admitting: Radiation Oncology

## 2013-11-06 ENCOUNTER — Ambulatory Visit: Payer: 59

## 2013-11-06 VITALS — BP 119/66 | HR 64 | Resp 16 | Wt 139.7 lb

## 2013-11-06 DIAGNOSIS — C50411 Malignant neoplasm of upper-outer quadrant of right female breast: Secondary | ICD-10-CM

## 2013-11-06 NOTE — Progress Notes (Signed)
Weekly Management Note:  Site: Right breast Current Dose:  3458  cGy Projected Dose: 1256  cGy  Narrative: The patient is seen today for routine under treatment assessment. CBCT/MVCT images/port films were reviewed. The chart was reviewed.   She is somewhat tearful and fatigued today. She had a busy weekend. She believes that she has been "foggy" and forgetful. She does feel stressed. She reports mild right breast discomfort as expected. She also has intermittent left breast discomfort similar to being "premenstrual". She is 7 years postmenopausal. She uses Radioplex gel on her skin.  Physical Examination:  Filed Vitals:   11/06/13 1453  BP: 119/66  Pulse: 64  Resp: 16  .  Weight: 139 lb 11.2 oz (63.368 kg). There is mild erythema along the right breast with no areas of desquamation.  Impression: Tolerating radiation therapy well. She may have fatigue related to her radiation therapy, but her fatigue is more likely to be secondary to a busy and emotional weekend with her daughter. If her left breast symptoms continue, then I feel that she should see her gynecologist, Dr. Sabra Heck.  Plan: Continue radiation therapy as planned.
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
## 2013-11-07 ENCOUNTER — Ambulatory Visit
Admission: RE | Admit: 2013-11-07 | Discharge: 2013-11-07 | Disposition: A | Discharge status: Home / Self Care | Payer: 59 | Source: Ambulatory Visit | Attending: Radiation Oncology | Admitting: Radiation Oncology

## 2013-11-07 ENCOUNTER — Ambulatory Visit: Payer: 59

## 2013-11-07 DIAGNOSIS — C50411 Malignant neoplasm of upper-outer quadrant of right female breast: Secondary | ICD-10-CM

## 2013-11-07 MED ORDER — RADIAPLEXRX EX GEL
Freq: Once | CUTANEOUS | Status: AC
  Administered 2013-11-07: 15:00:00 via TOPICAL

## 2013-11-08 ENCOUNTER — Telehealth: Payer: Self-pay | Admitting: Family Medicine

## 2013-11-08 ENCOUNTER — Ambulatory Visit: Payer: 59

## 2013-11-08 ENCOUNTER — Ambulatory Visit
Admission: RE | Admit: 2013-11-08 | Discharge: 2013-11-08 | Disposition: A | Discharge status: Home / Self Care | Payer: 59 | Source: Ambulatory Visit | Attending: Radiation Oncology | Admitting: Radiation Oncology

## 2013-11-08 MED ORDER — LISDEXAMFETAMINE DIMESYLATE 20 MG PO CAPS: 20.0000 mg | ORAL_CAPSULE | Freq: Every day | ORAL | Status: DC

## 2013-11-08 NOTE — Telephone Encounter (Signed)
Pt needs new rx  vyvanse 20 mg °

## 2013-11-08 NOTE — Telephone Encounter (Signed)
done

## 2013-11-08 NOTE — Telephone Encounter (Signed)
Script is ready for pick up and I spoke with pt.  

## 2013-11-09 ENCOUNTER — Ambulatory Visit: Payer: 59

## 2013-11-09 ENCOUNTER — Ambulatory Visit
Admission: RE | Admit: 2013-11-09 | Discharge: 2013-11-09 | Disposition: A | Discharge status: Home / Self Care | Payer: 59 | Source: Ambulatory Visit | Attending: Radiation Oncology | Admitting: Radiation Oncology

## 2013-11-09 ENCOUNTER — Encounter: Payer: Self-pay | Admitting: Radiation Oncology

## 2013-11-09 NOTE — Progress Notes (Signed)
[facility] End of Treatment Note  Name:Kristina Pearson  Date: 11/09/2013 KGU:[id number] DOB:10-Oct-1955   Status:outpatient    CC: Laurey Morale, MD  Dr. Fanny Skates  REFERRING PHYSICIAN:   Dr. 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 Rabenold tolerated treatment well with no significant skin toxicity by completion of therapy.   She used Radioplex gel during her course of treatment.                       PLAN: Routine followup in one month. Patient instructed to call if questions or worsening complaints in interim.

## 2013-11-10 ENCOUNTER — Other Ambulatory Visit: Payer: Self-pay | Admitting: *Deleted

## 2013-11-10 DIAGNOSIS — C50411 Malignant neoplasm of upper-outer quadrant of right female breast: Secondary | ICD-10-CM

## 2013-11-17 ENCOUNTER — Ambulatory Visit: Payer: Self-pay | Admitting: Obstetrics & Gynecology

## 2013-11-20 ENCOUNTER — Other Ambulatory Visit: Payer: 59

## 2013-11-20 ENCOUNTER — Ambulatory Visit: Payer: 59 | Admitting: Adult Health

## 2013-11-21 ENCOUNTER — Encounter: Payer: Self-pay | Admitting: Adult Health

## 2013-11-21 ENCOUNTER — Ambulatory Visit (HOSPITAL_BASED_OUTPATIENT_CLINIC_OR_DEPARTMENT_OTHER): Payer: 59 | Admitting: Adult Health

## 2013-11-21 ENCOUNTER — Other Ambulatory Visit (HOSPITAL_BASED_OUTPATIENT_CLINIC_OR_DEPARTMENT_OTHER): Payer: 59

## 2013-11-21 VITALS — BP 114/76 | HR 60 | Temp 97.6°F | Resp 18 | Ht 66.0 in | Wt 141.4 lb

## 2013-11-21 DIAGNOSIS — D0590 Unspecified type of carcinoma in situ of unspecified breast: Secondary | ICD-10-CM

## 2013-11-21 DIAGNOSIS — C50411 Malignant neoplasm of upper-outer quadrant of right female breast: Secondary | ICD-10-CM

## 2013-11-21 DIAGNOSIS — N6089 Other benign mammary dysplasias of unspecified breast: Secondary | ICD-10-CM

## 2013-11-21 LAB — CBC WITH DIFFERENTIAL/PLATELET
BASO%: 0.8 % (ref 0.0–2.0)
BASOS ABS: 0 10*3/uL (ref 0.0–0.1)
EOS ABS: 0.1 10*3/uL (ref 0.0–0.5)
EOS%: 2.4 % (ref 0.0–7.0)
HCT: 38.8 % (ref 34.8–46.6)
HEMOGLOBIN: 12.9 g/dL (ref 11.6–15.9)
LYMPH%: 21.4 % (ref 14.0–49.7)
MCH: 31.6 pg (ref 25.1–34.0)
MCHC: 33.2 g/dL (ref 31.5–36.0)
MCV: 95.1 fL (ref 79.5–101.0)
MONO#: 0.4 10*3/uL (ref 0.1–0.9)
MONO%: 7.7 % (ref 0.0–14.0)
NEUT#: 3.2 10*3/uL (ref 1.5–6.5)
NEUT%: 67.7 % (ref 38.4–76.8)
PLATELETS: 196 10*3/uL (ref 145–400)
RBC: 4.08 10*6/uL (ref 3.70–5.45)
RDW: 12.7 % (ref 11.2–14.5)
WBC: 4.7 10*3/uL (ref 3.9–10.3)
lymph#: 1 10*3/uL (ref 0.9–3.3)

## 2013-11-21 LAB — COMPREHENSIVE METABOLIC PANEL (CC13)
ALBUMIN: 4 g/dL (ref 3.5–5.0)
ALT: 15 U/L (ref 0–55)
ANION GAP: 11 meq/L (ref 3–11)
AST: 21 U/L (ref 5–34)
Alkaline Phosphatase: 44 U/L (ref 40–150)
BUN: 13.1 mg/dL (ref 7.0–26.0)
CO2: 24 meq/L (ref 22–29)
Calcium: 9.2 mg/dL (ref 8.4–10.4)
Chloride: 103 mEq/L (ref 98–109)
Creatinine: 1 mg/dL (ref 0.6–1.1)
GLUCOSE: 92 mg/dL (ref 70–140)
POTASSIUM: 4.7 meq/L (ref 3.5–5.1)
SODIUM: 138 meq/L (ref 136–145)
TOTAL PROTEIN: 6.8 g/dL (ref 6.4–8.3)
Total Bilirubin: 0.56 mg/dL (ref 0.20–1.20)

## 2013-11-21 MED ORDER — TAMOXIFEN CITRATE 20 MG PO TABS: 20.0000 mg | ORAL_TABLET | Freq: Every day | ORAL | Status: DC

## 2013-11-21 NOTE — Progress Notes (Signed)
[place] OFFICE PROGRESS NOTE  Patient Care Team: Laurey Morale, MD as PCP - General Dr. Fanny Skates  Dr. Arloa Koh  DIAGNOSIS: 58 year old female with new diagnosis of DCIS of the right breast  STAGE:  Breast cancer of upper-outer quadrant of right female breast  Primary site: Breast (Right)  Staging method: AJCC 7th Edition  Clinical: Stage 0 (Tis, N0, cM0)  Summary: Stage 0 (Tis, N0, cM0)  SUMMARY OF ONCOLOGIC HISTORY: #1 07/26/2013 patient underwent a screening mammogram and was found to have a new area of calcifications in the right breast. Additional views on 08/09/2013 revealed a 1 cm area within the upper outer quadrant of the right breast. On 08/15/2013 patient had a stereotactic core biopsy performed. 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 This was low grade. 08/21/2013 MRI of the breasts showed biopsy changes no other lesions.  #2 patient is status post lumpectomy of the right breast on 08/30/2013.Marland Kitchen The final pathology reveals no evidence of residual ductal carcinoma in situ. 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  #3 patient has been seen by Dr. Arloa Koh and completed adjuvant radiation therapy on 11/09/13.   Contemplating on the possibility of starting her on radiation therapy.  INTERVAL HISTORY: Kristina Pearson Well 58 y.o. female returns for followup visit today.  She has just completed radiation therapy by Dr. Valere Dross.  She is doing moderately well today.  She does have some lower back pain that she experienced from an injury she sustained from picking up a boxed artificial christmas tree two years ago.  She is trying to get in with Dr. Vertell Limber and hasn't had any success.  She otherwise is doing well.  Her skin is slightly tender following radiation therapy.  Otherwise, a 10 point ROS is neg.   ALLERGIES:  has No Known Allergies.  MEDICATIONS:  Current Outpatient Prescriptions  Medication Sig Dispense Refill   . ALPRAZolam (XANAX) 0.5 MG tablet Take 1 tablet (0.5 mg total) by mouth 2 (two) times daily as needed for anxiety.  180 tablet  1  . Calcium Carbonate-Vitamin D (CALCIUM 600+D) 600-400 MG-UNIT per tablet Take 1 tablet by mouth 2 (two) times daily. And Vitamin K      . celecoxib (CELEBREX) 200 MG capsule Take 1 capsule (200 mg total) by mouth 2 (two) times daily.  180 capsule  3  . hyaluronate sodium (RADIAPLEXRX) GEL Apply 1 application topically 2 (two) times daily.      Marland Kitchen levothyroxine (SYNTHROID, LEVOTHROID) 112 MCG tablet TAKE 1 TABLET DAILY  90 tablet  1  . lisdexamfetamine (VYVANSE) 20 MG capsule Take 1 capsule (20 mg total) by mouth daily.  30 capsule  0  . methocarbamol (ROBAXIN) 500 MG tablet Take 500 mg by mouth at bedtime as needed for muscle spasms.      . Multiple Vitamin (MULTIVITAMIN) tablet Take 1 tablet by mouth daily.        Marland Kitchen omeprazole (PRILOSEC) 40 MG capsule Take 1 capsule (40 mg total) by mouth 2 (two) times daily as needed.  180 capsule  3  . traZODone (DESYREL) 100 MG tablet TAKE 2 TABLETS EVERY NIGHT  60 tablet  8  . CRANBERRY EXTRACT PO Take 1 tablet by mouth daily as needed.       Marland Kitchen HYDROcodone-acetaminophen (NORCO/VICODIN) 5-325 MG per tablet Take 1 tablet by mouth every 6 (six) hours as needed for moderate pain.  60 tablet  0  . tamoxifen (NOLVADEX) 20  MG tablet Take 1 tablet (20 mg total) by mouth daily.  30 tablet  3   No current facility-administered medications for this visit.    REVIEW OF SYSTEMS:   A 10 point review of systems was conducted and is otherwise negative except for what is noted above.     PHYSICAL EXAMINATION: ECOG PERFORMANCE STATUS: 0 - Asymptomatic  Filed Vitals:   11/21/13 1143  BP: 114/76  Pulse: 60  Temp: 97.6 F (36.4 C)  Resp: 18   Filed Weights   11/21/13 1143  Weight: 141 lb 6.4 oz (64.139 kg)    GENERAL: Patient is a well appearing female in no acute distress HEENT:  Sclerae anicteric.  Oropharynx clear and moist. No  ulcerations or evidence of oropharyngeal candidiasis. Neck is supple.  NODES:  No cervical, supraclavicular, or axillary lymphadenopathy palpated.  BREAST EXAM:  Right breast with erythema and mild peeling, left breast no masses, nodules, benign breast exam LUNGS:  Clear to auscultation bilaterally.  No wheezes or rhonchi. HEART:  Regular rate and rhythm. No murmur appreciated. ABDOMEN:  Soft, nontender.  Positive, normoactive bowel sounds. No organomegaly palpated. MSK:  No focal spinal tenderness to palpation. Full range of motion bilaterally in the upper extremities. EXTREMITIES:  No peripheral edema.   SKIN:  Clear with no obvious rashes or skin changes. No nail dyscrasia. NEURO:  Nonfocal. Well oriented.  Appropriate affect.    LABORATORY DATA:  I have reviewed the data as listed    Component Value Date/Time   NA 138 11/21/2013 1116   NA 141 11/26/2011 1223   K 4.7 11/21/2013 1116   K 4.7 11/26/2011 1223   CL 107 11/26/2011 1223   CO2 24 11/21/2013 1116   CO2 28 11/26/2011 1223   GLUCOSE 92 11/21/2013 1116   GLUCOSE 84 11/26/2011 1223   BUN 13.1 11/21/2013 1116   BUN 14 11/26/2011 1223   CREATININE 1.0 11/21/2013 1116   CREATININE 1.0 11/26/2011 1223   CALCIUM 9.2 11/21/2013 1116   CALCIUM 9.1 11/26/2011 1223   PROT 6.8 11/21/2013 1116   PROT 6.5 11/26/2011 1223   ALBUMIN 4.0 11/21/2013 1116   ALBUMIN 3.8 11/26/2011 1223   AST 21 11/21/2013 1116   AST 21 11/26/2011 1223   ALT 15 11/21/2013 1116   ALT 17 11/26/2011 1223   ALKPHOS 44 11/21/2013 1116   ALKPHOS 39 11/26/2011 1223   BILITOT 0.56 11/21/2013 1116   BILITOT 0.7 11/26/2011 1223   GFRNONAA 61.50 05/15/2009 1037   GFRAA 75 12/20/2007 1126    No results found for this basename: SPEP,  UPEP,   kappa and lambda light chains    Lab Results  Component Value Date   WBC 4.7 11/21/2013   NEUTROABS 3.2 11/21/2013   HGB 12.9 11/21/2013   HCT 38.8 11/21/2013   MCV 95.1 11/21/2013   PLT 196 11/21/2013      Chemistry      Component Value Date/Time   NA 138 11/21/2013 1116    NA 141 11/26/2011 1223   K 4.7 11/21/2013 1116   K 4.7 11/26/2011 1223   CL 107 11/26/2011 1223   CO2 24 11/21/2013 1116   CO2 28 11/26/2011 1223   BUN 13.1 11/21/2013 1116   BUN 14 11/26/2011 1223   CREATININE 1.0 11/21/2013 1116   CREATININE 1.0 11/26/2011 1223      Component Value Date/Time   CALCIUM 9.2 11/21/2013 1116   CALCIUM 9.1 11/26/2011 1223   ALKPHOS 44 11/21/2013 1116  ALKPHOS 39 11/26/2011 1223   AST 21 11/21/2013 1116   AST 21 11/26/2011 1223   ALT 15 11/21/2013 1116   ALT 17 11/26/2011 1223   BILITOT 0.56 11/21/2013 1116   BILITOT 0.7 11/26/2011 1223         ASSESSMENT & PLAN:  58 year-old female with  #1 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 Status post right lumpectomy. On final specimen patient had no evidence of residual disease. She was found to have atypical lobular hyperplasia.  She was recommended Tamoxifen for chemoprevention by Dr. Humphrey Rolls.  She has completed radiation therapy and tolerated it well.  I reviewed Tamoxifen with her in detail and gave her information and health maintenance recommendations in her AVS.  I prescribed Tamoxifen 20mg  daily.    The patient will return in 3 months for labs and evaluation.   She knows to call us in the interim for any questions or concerns.  We can certainly see her sooner if needed.  I spent 25 minutes counseling the patient face to face.  The total time spent in the appointment was 30 minutes.  Minette Headland, [place] [phone]

## 2013-11-21 NOTE — Patient Instructions (Addendum)
Recommendations:  Healthy diet, exercise, monthly self breast exams, bone density every 2 years, cholesterol checks per your primary care doctor, annual eye exam, colonoscopy as determined by GI.      Tamoxifen oral tablet What is this medicine? TAMOXIFEN (ta MOX i fen) blocks the effects of estrogen. It is commonly used to treat breast cancer. It is also used to decrease the chance of breast cancer coming back in women who have received treatment for the disease. It may also help prevent breast cancer in women who have a high risk of developing breast cancer. This medicine may be used for other purposes; ask your health care provider or pharmacist if you have questions. COMMON BRAND NAME(S): Nolvadex What should I tell my health care provider before I take this medicine? They need to know if you have any of these conditions: -blood clots -blood disease -cataracts or impaired eyesight -endometriosis -high calcium levels -high cholesterol -irregular menstrual cycles -liver disease -stroke -uterine fibroids -an unusual or allergic reaction to tamoxifen, other medicines, foods, dyes, or preservatives -pregnant or trying to get pregnant -breast-feeding How should I use this medicine? Take this medicine by mouth with a glass of water. Follow the directions on the prescription label. You can take it with or without food. Take your medicine at regular intervals. Do not take your medicine more often than directed. Do not stop taking except on your doctor's advice. A special MedGuide will be given to you by the pharmacist with each prescription and refill. Be sure to read this information carefully each time. Talk to your pediatrician regarding the use of this medicine in children. While this drug may be prescribed for selected conditions, precautions do apply. Overdosage: If you think you have taken too much of this medicine contact a poison control center or emergency room at once. NOTE: This  medicine is only for you. Do not share this medicine with others. What if I miss a dose? If you miss a dose, take it as soon as you can. If it is almost time for your next dose, take only that dose. Do not take double or extra doses. What may interact with this medicine? -aminoglutethimide -bromocriptine -chemotherapy drugs -female hormones, like estrogens and birth control pills -letrozole -medroxyprogesterone -phenobarbital -rifampin -warfarin This list may not describe all possible interactions. Give your health care provider a list of all the medicines, herbs, non-prescription drugs, or dietary supplements you use. Also tell them if you smoke, drink alcohol, or use illegal drugs. Some items may interact with your medicine. What should I watch for while using this medicine? Visit your doctor or health care professional for regular checks on your progress. You will need regular pelvic exams, breast exams, and mammograms. If you are taking this medicine to reduce your risk of getting breast cancer, you should know that this medicine does not prevent all types of breast cancer. If breast cancer or other problems occur, there is no guarantee that it will be found at an early stage. Do not become pregnant while taking this medicine or for 2 months after stopping this medicine. Stop taking this medicine if you get pregnant or think you are pregnant and contact your doctor. This medicine may harm your unborn baby. Women who can possibly become pregnant should use birth control methods that do not use hormones during tamoxifen treatment and for 2 months after therapy has stopped. Talk with your health care provider for birth control advice. Do not breast feed while taking this  medicine. What side effects may I notice from receiving this medicine? Side effects that you should report to your doctor or health care professional as soon as possible: -changes in vision (blurred vision) -changes in your  menstrual cycle -difficulty breathing or shortness of breath -difficulty walking or talking -new breast lumps -numbness -pelvic pain or pressure -redness, blistering, peeling or loosening of the skin, including inside the mouth -skin rash or itching (hives) -sudden chest pain -swelling of lips, face, or tongue -swelling, pain or tenderness in your calf or leg -unusual bruising or bleeding -vaginal discharge that is bloody, brown, or rust -weakness -yellowing of the whites of the eyes or skin Side effects that usually do not require medical attention (report to your doctor or health care professional if they continue or are bothersome): -fatigue -hair loss, although uncommon and is usually mild -headache -hot flashes -impotence (in men) -nausea, vomiting (mild) -vaginal discharge (white or clear) This list may not describe all possible side effects. Call your doctor for medical advice about side effects. You may report side effects to FDA at 1-800-FDA-1088. Where should I keep my medicine? Keep out of the reach of children. Store at room temperature between 20 and 25 degrees C (68 and 77 degrees F). Protect from light. Keep container tightly closed. Throw away any unused medicine after the expiration date. NOTE: This sheet is a summary. It may not cover all possible information. If you have questions about this medicine, talk to your doctor, pharmacist, or health care provider.  2014, Elsevier/Gold Standard. (2008-02-23 12:01:56)

## 2013-11-22 ENCOUNTER — Telehealth: Payer: Self-pay | Admitting: Adult Health

## 2013-11-22 ENCOUNTER — Telehealth: Payer: Self-pay | Admitting: Family Medicine

## 2013-11-22 ENCOUNTER — Telehealth: Payer: Self-pay | Admitting: *Deleted

## 2013-11-22 NOTE — Telephone Encounter (Signed)
Okay, per Dr. Fry. 

## 2013-11-22 NOTE — Telephone Encounter (Signed)
Pt is having back pain also trouble walking

## 2013-11-22 NOTE — Telephone Encounter (Signed)
Pt is aware.  

## 2013-11-22 NOTE — Telephone Encounter (Signed)
Pt called because she is still having severe back pain. Pt was seen here in the office yesterday for a follow appt. Pt was able to get an appt with Dr. Vertell Limber for Jun 24th. In the meantime she has an appt with Dr. Sarajane Jews, her PCP @ 9:30 for this problem. Pt's concern is that she may not be able to tolerate the pain. She has been taking pain medication but hasn't been effective in controlling the pain. Advised pt if she can't wait until appt with PCP tomorrow and if pain continues or worsens she may want to go to [facility] Emergency Dept. Pt verbalized understanding.I will follow up with pt  tomorrow. Message to be forwarded to Charlestine Massed, NP.

## 2013-11-22 NOTE — Telephone Encounter (Signed)
per pof to sch pt appt in 3 mths-sch pt w/Gudena-cld & left message with pt to advise of time & date of appt

## 2013-11-23 ENCOUNTER — Ambulatory Visit (INDEPENDENT_AMBULATORY_CARE_PROVIDER_SITE_OTHER): Payer: 59 | Admitting: Family Medicine

## 2013-11-23 ENCOUNTER — Encounter: Payer: Self-pay | Admitting: Family Medicine

## 2013-11-23 VITALS — BP 88/60 | HR 75 | Temp 98.3°F | Ht 66.0 in | Wt 145.0 lb

## 2013-11-23 DIAGNOSIS — M545 Low back pain, unspecified: Secondary | ICD-10-CM | POA: Insufficient documentation

## 2013-11-23 MED ORDER — METHYLPREDNISOLONE ACETATE 80 MG/ML IJ SUSP
120.0000 mg | Freq: Once | INTRAMUSCULAR | Status: AC
  Administered 2013-11-23: 120 mg via INTRAMUSCULAR

## 2013-11-23 MED ORDER — METHOCARBAMOL 500 MG PO TABS: 500.0000 mg | ORAL_TABLET | Freq: Four times a day (QID) | ORAL | Status: DC | PRN

## 2013-11-23 MED ORDER — HYDROCODONE-ACETAMINOPHEN 10-325 MG PO TABS: 1.0000 | ORAL_TABLET | Freq: Four times a day (QID) | ORAL | Status: DC | PRN

## 2013-11-23 NOTE — Addendum Note (Signed)
Addended by: Aggie Hacker A on: 11/23/2013 10:37 AM   Modules accepted: Orders

## 2013-11-23 NOTE — Progress Notes (Signed)
   Subjective:    Patient ID: Kristina Pearson, female    DOB: 05/30/1956, 58 y.o.   MRN: [id number]  HPI Here for 2 days of severe left lower back pain which radiates to the left thigh. No numbness or weakness. No recent trauma but this [place] Using Vicodin, heat, and Robaxin.    Review of Systems  Constitutional: Negative.   Musculoskeletal: Positive for back pain.       Objective:   Physical Exam  Constitutional:  In pain, walks with difficulty   Musculoskeletal:  Tender in the left lower back with a lot of spasm. ROM is reduced, negative SLR          Assessment & Plan:  Increase Vicodin to 10/325 prn. Use heat and Robaxin. Given a steroid shot. Refer to Dr. Vertell Limber per her request

## 2013-11-23 NOTE — Progress Notes (Signed)
Pre visit review using our clinic review tool, if applicable. No additional management support is needed unless otherwise documented below in the visit note. 

## 2013-11-29 ENCOUNTER — Other Ambulatory Visit: Payer: Self-pay | Admitting: Adult Health

## 2013-11-29 NOTE — Progress Notes (Signed)
Called and spoke with patient.  She will f/u with Dr. Vertell Limber and wait 1-2 weeks to start Tamoxifen because she is concerned about inactivity.    Minette Headland, [place] [phone]

## 2013-12-02 ENCOUNTER — Telehealth: Payer: Self-pay | Admitting: Hematology and Oncology

## 2013-12-06 ENCOUNTER — Other Ambulatory Visit: Payer: Self-pay | Admitting: Family Medicine

## 2013-12-06 ENCOUNTER — Ambulatory Visit (INDEPENDENT_AMBULATORY_CARE_PROVIDER_SITE_OTHER): Payer: 59 | Admitting: Family Medicine

## 2013-12-06 ENCOUNTER — Encounter: Payer: Self-pay | Admitting: Family Medicine

## 2013-12-06 VITALS — BP 83/56 | HR 71 | Temp 98.6°F | Ht 66.0 in | Wt 139.0 lb

## 2013-12-06 DIAGNOSIS — Z0000 Encounter for general adult medical examination without abnormal findings: Secondary | ICD-10-CM

## 2013-12-06 DIAGNOSIS — R002 Palpitations: Secondary | ICD-10-CM

## 2013-12-06 NOTE — Progress Notes (Signed)
Pre visit review using our clinic review tool, if applicable. No additional management support is needed unless otherwise documented below in the visit note. 

## 2013-12-07 ENCOUNTER — Encounter: Payer: Self-pay | Admitting: Family Medicine

## 2013-12-07 ENCOUNTER — Telehealth: Payer: Self-pay | Admitting: *Deleted

## 2013-12-07 NOTE — Telephone Encounter (Signed)
Returned call from pt who states yesterday she developed pain in her right breast. She is questioning if this pain is from radiation treatment which she completed on 11/09/13. Informed [name] that radiation does not cause pain. She states she took Celebrex 2 tabs and slept well. Advised pt that if the pain returns she should call her surgeon's office and speak with his nurse. Patient states she will see how she feels today. Reminded pt of FU w/Dr Valere Dross on 12/14/13.

## 2013-12-07 NOTE — Progress Notes (Signed)
   Subjective:    Patient ID: Kristina Pearson, female    DOB: 03-12-1956, 58 y.o.   MRN: [id number]  HPI 58 yr old female for a cpx. She feels well in general although she has had some low back pain. She saw Dr. Vertell Limber and she is set up for an MRI of the lumbar spine on Friday.    Review of Systems  Constitutional: Negative.   HENT: Negative.   Eyes: Negative.   Respiratory: Negative.   Cardiovascular: Negative.   Gastrointestinal: Negative.   Genitourinary: Negative for dysuria, urgency, frequency, hematuria, flank pain, decreased urine volume, enuresis, difficulty urinating, pelvic pain and dyspareunia.  Musculoskeletal: Negative.   Skin: Negative.   Neurological: Negative.   Psychiatric/Behavioral: Negative.        Objective:   Physical Exam  Constitutional: She is oriented to person, place, and time. She appears well-developed and well-nourished. No distress.  HENT:  Head: Normocephalic and atraumatic.  Right Ear: External ear normal.  Left Ear: External ear normal.  Nose: Nose normal.  Mouth/Throat: Oropharynx is clear and moist. No oropharyngeal exudate.  Eyes: Conjunctivae and EOM are normal. Pupils are equal, round, and reactive to light. No scleral icterus.  Neck: Normal range of motion. Neck supple. No JVD present. No thyromegaly present.  Cardiovascular: Normal rate, regular rhythm, normal heart sounds and intact distal pulses.  Exam reveals no gallop and no friction rub.   No murmur heard. Pulmonary/Chest: Effort normal and breath sounds normal. No respiratory distress. She has no wheezes. She has no rales. She exhibits no tenderness.  Abdominal: Soft. Bowel sounds are normal. She exhibits no distension and no mass. There is no tenderness. There is no rebound and no guarding.  Musculoskeletal: Normal range of motion. She exhibits no edema and no tenderness.  Lymphadenopathy:    She has no cervical adenopathy.  Neurological: She is alert and oriented to person, place,  and time. She has normal reflexes. No cranial nerve deficit. She exhibits normal muscle tone. Coordination normal.  Skin: Skin is warm and dry. No rash noted. No erythema.  Psychiatric: She has a normal mood and affect. Her behavior is normal. Judgment and thought content normal.          Assessment & Plan:  Well exam. She needs to contact Dr. Earlean Shawl about getting another colonoscopy.

## 2013-12-07 NOTE — Telephone Encounter (Signed)
She already got #180 with a refill in April

## 2013-12-09 ENCOUNTER — Other Ambulatory Visit: Payer: Self-pay | Admitting: Family Medicine

## 2013-12-11 ENCOUNTER — Telehealth: Payer: Self-pay | Admitting: Family Medicine

## 2013-12-11 NOTE — Telephone Encounter (Signed)
Can we refill this? 

## 2013-12-11 NOTE — Telephone Encounter (Signed)
Script was approved and printed for Xanax, however I called in script per pt request.

## 2013-12-12 ENCOUNTER — Encounter: Payer: Self-pay | Admitting: *Deleted

## 2013-12-14 ENCOUNTER — Encounter: Payer: Self-pay | Admitting: Radiation Oncology

## 2013-12-14 ENCOUNTER — Ambulatory Visit
Admission: RE | Admit: 2013-12-14 | Discharge: 2013-12-14 | Disposition: A | Discharge status: Home / Self Care | Payer: 59 | Source: Ambulatory Visit | Attending: Radiation Oncology | Admitting: Radiation Oncology

## 2013-12-14 VITALS — BP 126/64 | HR 66 | Temp 97.6°F | Resp 20 | Wt 136.0 lb

## 2013-12-14 DIAGNOSIS — C50411 Malignant neoplasm of upper-outer quadrant of right female breast: Secondary | ICD-10-CM

## 2013-12-14 NOTE — Progress Notes (Signed)
Patient denies pain in her right breast but states her nipple is tender. She does have back pain and is scheduled for a steroid injection in two weeks. She has not begun taking Tamoxifen, will discuss w./Dr Valere Dross today as she is concerned about the side effects.  Her skin of right breast has healed.

## 2013-12-14 NOTE — Progress Notes (Signed)
Followup note:  Kristina Pearson returns today approximately 5 weeks following completion of radiation therapy following conservative surgery in the management of her low grade DCIS of the right breast. She is without complaints today except for low back pain for which she is scheduled for a steroid injection in 2 weeks. She has not yet started her adjuvant tamoxifen. She'll see Dr. Dalbert Batman on August 21 and visit medical oncology on September 16.  Physical examination: Alert and oriented. Filed Vitals:   12/14/13 1423  BP: 126/64  Pulse: 66  Temp: 97.6 F (36.4 C)  Resp: 20   Nodes: There is no palpable axillary lymphadenopathy. Breasts: There is residual hyperpigmentation of the skin along the right breast with no significant desquamation. Left breast without masses or lesions. Extremities: Without edema.  Impression: Satisfactory progress from an oncology standpoint. She would like to have her steroid injection for her back pain before starting tamoxifen. She typically has mammography in February, I think she can have a baseline right breast mammogram and left breast screening mammogram in February of 2016.  Plan: Followup as noted above.

## 2013-12-25 ENCOUNTER — Telehealth: Payer: Self-pay | Admitting: Obstetrics & Gynecology

## 2013-12-25 NOTE — Telephone Encounter (Signed)
Patient is having breast pain. Patient would like an appointment with Dr Sabra Heck.

## 2013-12-25 NOTE — Telephone Encounter (Signed)
Spoke with patient. She is complaining of bilateral Breast Pain. Released from radiation oncology on 11/09/13 and was a patient of Dr. Chancy Milroy but will be seeing new provider in September. She is having "achy" feelings between both breasts and axillary areas. Patient would like to see Dr. Sabra Heck for this because she feels like Dr. Sabra Heck knows her more than the new provider will at the cancer center. She also has questions about starting tamoxifen.  Offered office visit with patient tomorrow, at 1515 (time/day per Lamont Snowball, RN) and advised patient that appointment may need to be changed in case of change in Dr. Ammie Ferrier schedule. Patient is agreeable to this and will reschedule if necessary.  If needs r/s patient requests a call on her cell phone at [phone].  Routing to provider for final review. Patient agreeable to disposition. Will close encounter

## 2013-12-26 ENCOUNTER — Telehealth: Payer: Self-pay | Admitting: Obstetrics & Gynecology

## 2013-12-26 ENCOUNTER — Ambulatory Visit (INDEPENDENT_AMBULATORY_CARE_PROVIDER_SITE_OTHER): Payer: 59 | Admitting: Obstetrics & Gynecology

## 2013-12-26 ENCOUNTER — Encounter: Payer: Self-pay | Admitting: Obstetrics & Gynecology

## 2013-12-26 VITALS — BP 120/80 | HR 68 | Resp 16 | Wt 135.2 lb

## 2013-12-26 DIAGNOSIS — N644 Mastodynia: Secondary | ICD-10-CM

## 2013-12-26 NOTE — Progress Notes (Signed)
Subjective:     Patient ID: Kristina Pearson, female   DOB: 09-06-1955, 58 y.o.   MRN: [id number]  HPI 58 yo G2P1 MWF here for complaint of breast pain at her surgical site and up into her axilla.  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  Pt underwent partial right mastectomy with Dr. Dalbert Batman 08/30/13.  Final pathology showed no DCIS but extensive changes were biopsy had been done.  Pt then proceeded to radiaiton which she completed.  She states she did have some mild skin burning with the radiation but this has all healed.  Final dose was 11/09/13.    Pt very anxious.  Dr. Humphrey Rolls was her oncologist.  She is "between oncologists" right now.  Pt was to start on Tamoxifen but didn't as she has been experiencing worse back pain that occurred after injury on the job.  She was to have an epidural injection in her back but insurance didn't approve it stating it was injury related.  Pt has final hearing with settlement regarding job injury in early August when all of that will be finalized.  So, even if she has to pay out of pocket, she will have the money for it then.  Wants to know if it is safe to wait until August to start the Tamoxifen.   Will be seeing Dr. Lindi Adie 9/16 as new oncologist.    Review of Systems  All other systems reviewed and are negative.      Objective:   Physical Exam  Constitutional: She appears well-developed and well-nourished.  Neck: Normal range of motion. Neck supple. No thyromegaly present.  Pulmonary/Chest: Right breast exhibits tenderness. Right breast exhibits no inverted nipple, no mass, no nipple discharge and no skin change. Left breast exhibits no inverted nipple, no mass, no nipple discharge, no skin change and no tenderness. Breasts are asymmetrical.    Lymphadenopathy:    She has no cervical adenopathy.       Assessment:    H/O DCIS now with breast pain.  No palpable abnormalities noted.  Radiation complete 11/09/13.     Plan:     Will check  with Dr. Jana Hakim in regards to her waiting to start Tamoxifen.  I feel this will most likely be fine.  She just needs someone to say it is ok.  Will be seeing new oncologist in September Heat, anti-inflammatories d/w pt.  As her final pathology showed no residual DCIS, d/w pt very low risk for recurrence issues.    MMG due 2/16

## 2013-12-26 NOTE — Telephone Encounter (Signed)
Left message to call [place] at [phone].  Offer patient August 10th at 2:30pm with Dr.Miller.

## 2013-12-26 NOTE — Telephone Encounter (Signed)
Pt needs a 1 month breast check with Dr Sabra Heck.

## 2013-12-29 ENCOUNTER — Telehealth: Payer: Self-pay | Admitting: *Deleted

## 2013-12-29 NOTE — Telephone Encounter (Signed)
Received call from patient stating she noticed this morning that she has "little raised bumps around her tattoo mark between her breasts". She denies pain, itching of this area. She states she applied Radiaplex to the rash. Patient states she is concened because she "still has occasional soreness in her right breast and axilla". Pt completed radiation treatment to her right breast on 11/09/13. Informed pt that it is unlikely the rash is related to her radiation because it has been almost 2 months since she completed. Advised she watch the area and if worse, call next week. Informed her this message with her concerns will be e mailed to Dr Valere Dross who will return to this office on 01/01/14. Patient verbalized agreement, understanding.

## 2013-12-29 NOTE — Telephone Encounter (Signed)
Spoke with patient. Patient is calling to scheduled 1 month recheck with Dr.Miller. Appointment scheduled with Dr.Miller for 8/14 at 10:45am. Agreeable to date and time. Patient would like Dr.Miller to know that she has a "rash of raised red bumps" ib between her beasts where her "tattoo" for radiation is. Patient states that she thought it may be heat rash but has not been in the heat. Patient would like to know if she should call the cancer center. Advised she should give them a call to let them know what is going on and see what they recommend because they may want to see her to evaluate. Patient is agreeable and will call. Patient is also checking on status of call to Dr.Magrinat from [place] about delaying tamoxifen. Advised would send a message to Dr.Miller but she is out of the office today and that we will give her a call back Monday morning with update. Patient is agreeable.

## 2014-01-01 ENCOUNTER — Encounter: Payer: Self-pay | Admitting: Obstetrics & Gynecology

## 2014-01-02 ENCOUNTER — Ambulatory Visit (INDEPENDENT_AMBULATORY_CARE_PROVIDER_SITE_OTHER): Payer: 59 | Admitting: Family Medicine

## 2014-01-02 NOTE — Telephone Encounter (Signed)
Kristina Pearson with Dr Magrinat's office calling regarding message left from Dr Sabra Heck. Pt is to proceed with medication and not stop it right now. Office number [phone].

## 2014-01-02 NOTE — Telephone Encounter (Signed)
No.  Thank you for relaying message from Dr. Jana Hakim.

## 2014-01-02 NOTE — Telephone Encounter (Signed)
Call to patient and given message from [facility] at Dr Magrinat's office to proceed with medication and not to stop it right now. Patient clearly disappointed with this answer. States she is concerned that medication will cause more problems with back pain and she wanted to hold off on this.  Advised she can call valerie to discuss and given phone number to call [facility] [phone].   Patient has appointment with Dr Sabra Heck 02-02-14 for recheck.    Routing to provider for final review. Patient agreeable to disposition. Any further follow-up needed?

## 2014-01-03 ENCOUNTER — Ambulatory Visit (INDEPENDENT_AMBULATORY_CARE_PROVIDER_SITE_OTHER): Payer: 59 | Admitting: Family Medicine

## 2014-01-03 ENCOUNTER — Encounter: Payer: Self-pay | Admitting: Family Medicine

## 2014-01-03 VITALS — BP 100/68 | HR 71 | Temp 98.7°F | Ht 66.0 in | Wt 133.0 lb

## 2014-01-03 DIAGNOSIS — B3789 Other sites of candidiasis: Secondary | ICD-10-CM

## 2014-01-03 DIAGNOSIS — K219 Gastro-esophageal reflux disease without esophagitis: Secondary | ICD-10-CM

## 2014-01-03 DIAGNOSIS — H9201 Otalgia, right ear: Secondary | ICD-10-CM

## 2014-01-03 DIAGNOSIS — H9209 Otalgia, unspecified ear: Secondary | ICD-10-CM

## 2014-01-03 MED ORDER — RANITIDINE HCL 300 MG PO TABS: 300.0000 mg | ORAL_TABLET | Freq: Two times a day (BID) | ORAL | Status: DC

## 2014-01-03 NOTE — Progress Notes (Signed)
   Subjective:    Patient ID: Kristina Pearson, female    DOB: Aug 20, 1955, 58 y.o.   MRN: [id number]  HPI Here for several issues. First she has concerns about the safety of long term use of Omeprazole. She heard some warnings on the TV about it. This has worked well for her GERD. Also she has had a red rash on the chest for 2 weeks. Also she asks me to check her right ear since she has had some mild discomfort in it this week. She has been swimming. No sinus pressure or HA.    Review of Systems  Constitutional: Negative.   HENT: Positive for ear pain. Negative for congestion, ear discharge, hearing loss, postnasal drip and sinus pressure.   Eyes: Negative.   Respiratory: Negative.   Gastrointestinal: Negative.   Skin: Positive for rash.       Objective:   Physical Exam  Constitutional: She appears well-developed and well-nourished.  HENT:  Right Ear: External ear normal.  Left Ear: External ear normal.  Nose: Nose normal.  Mouth/Throat: Oropharynx is clear and moist.  Eyes: Conjunctivae are normal.  Abdominal: Soft. Bowel sounds are normal. She exhibits no distension and no mass. There is no tenderness. There is no rebound and no guarding.  Lymphadenopathy:    She has no cervical adenopathy.  Skin:  Patch of red macular skin between the breasts with satellite lesions          Assessment & Plan:  I see no explanation for the ear pain so she can try a decongestant like Mucinex in case it is related to eustachian tube function. Try OTC Lamisil cream for the rash. Switch from Omeprazole to Zantac 300 mg bid

## 2014-01-03 NOTE — Progress Notes (Signed)
Pre visit review using our clinic review tool, if applicable. No additional management support is needed unless otherwise documented below in the visit note. 

## 2014-01-15 ENCOUNTER — Ambulatory Visit (INDEPENDENT_AMBULATORY_CARE_PROVIDER_SITE_OTHER): Payer: 59 | Admitting: Family Medicine

## 2014-01-15 ENCOUNTER — Encounter: Payer: Self-pay | Admitting: Family Medicine

## 2014-01-15 VITALS — BP 99/63 | HR 73 | Temp 98.7°F | Ht 66.0 in | Wt 132.0 lb

## 2014-01-15 DIAGNOSIS — I809 Phlebitis and thrombophlebitis of unspecified site: Secondary | ICD-10-CM

## 2014-01-15 DIAGNOSIS — M2141 Flat foot [pes planus] (acquired), right foot: Secondary | ICD-10-CM

## 2014-01-15 DIAGNOSIS — M2142 Flat foot [pes planus] (acquired), left foot: Secondary | ICD-10-CM

## 2014-01-15 DIAGNOSIS — M2140 Flat foot [pes planus] (acquired), unspecified foot: Secondary | ICD-10-CM

## 2014-01-15 NOTE — Progress Notes (Signed)
Pre visit review using our clinic review tool, if applicable. No additional management support is needed unless otherwise documented below in the visit note. 

## 2014-01-15 NOTE — Progress Notes (Signed)
   Subjective:    Patient ID: Kristina Pearson, female    DOB: 25-Jun-1955, 58 y.o.   MRN: [id number]  HPI Here for the onset 2 days ago of a painful swollen blood vessel in the left lower leg. No recent periods of immobilization except spending a total of one hour in her car over the weekend. She took some aspirin and soaked in a tub of hot water, and this helped. Today it feels much better and the swelling has almost disappeared. No chest pain or SOB. Also she asks about getting some rigid arch supports. Both of her feet are flat and she has tried a number of OTC arch supports. Both feet hurt when she spends much time on her feet.    Review of Systems  Constitutional: Negative.   Respiratory: Negative.   Cardiovascular: Positive for leg swelling. Negative for chest pain and palpitations.       Objective:   Physical Exam  Constitutional: She appears well-developed and well-nourished.  Cardiovascular: Normal rate, regular rhythm, normal heart sounds and intact distal pulses.   Pulmonary/Chest: Effort normal and breath sounds normal.  Musculoskeletal:  The left lower leg has a superficial slightly swollen vein which is slightly tender. No foot edema           Assessment & Plan:  Phlebitis. No evidence of a thrombus. Continue heat, elevate the leg, and take some aspirin. Recheck prn. As for the flat arches we will refer her to Podiatry.

## 2014-01-17 ENCOUNTER — Other Ambulatory Visit: Payer: Self-pay | Admitting: Family Medicine

## 2014-01-17 ENCOUNTER — Telehealth: Payer: Self-pay | Admitting: Family Medicine

## 2014-01-17 NOTE — Telephone Encounter (Signed)
I spoke with pt and she will pick up script for TED stockings and will check with [facility] About typhoid injection.

## 2014-01-17 NOTE — Telephone Encounter (Signed)
Done

## 2014-01-17 NOTE — Telephone Encounter (Addendum)
Pt is still having problems w/ veins in her left leg. Pain is worse at the end of day. Pt would like a rx for compressions stockings.  Pt cannot see podiatrist until 8/12. Pt states  She thought she was getting better, but is not.pls advise [facility]  Pt states she will be traveling to [place] leaving 8/22  is its reccommended she get typhoid inj. Pt would like rx for that. pls advise

## 2014-01-17 NOTE — Telephone Encounter (Signed)
Can you put pt on schedule for 01/19/14 Friday TDAP injection at 11:45 am? I already spoke with pt.

## 2014-01-17 NOTE — Telephone Encounter (Signed)
Wrote rx for the TED stockings but we do not carry typhoid shots. She could probably get this at her pharmacy or the [facility].

## 2014-01-19 ENCOUNTER — Ambulatory Visit: Payer: 59 | Admitting: Family Medicine

## 2014-01-26 DIAGNOSIS — Z0271 Encounter for disability determination: Secondary | ICD-10-CM

## 2014-01-30 ENCOUNTER — Ambulatory Visit: Payer: 59 | Admitting: Family Medicine

## 2014-01-31 ENCOUNTER — Ambulatory Visit (INDEPENDENT_AMBULATORY_CARE_PROVIDER_SITE_OTHER): Payer: 59 | Admitting: Podiatry

## 2014-01-31 ENCOUNTER — Encounter: Payer: Self-pay | Admitting: Podiatry

## 2014-01-31 ENCOUNTER — Ambulatory Visit (INDEPENDENT_AMBULATORY_CARE_PROVIDER_SITE_OTHER): Payer: 59

## 2014-01-31 VITALS — BP 98/58 | HR 57 | Resp 16 | Ht 66.0 in | Wt 135.0 lb

## 2014-01-31 DIAGNOSIS — M722 Plantar fascial fibromatosis: Secondary | ICD-10-CM

## 2014-01-31 DIAGNOSIS — M2142 Flat foot [pes planus] (acquired), left foot: Principal | ICD-10-CM

## 2014-01-31 DIAGNOSIS — M779 Enthesopathy, unspecified: Secondary | ICD-10-CM

## 2014-01-31 DIAGNOSIS — M2141 Flat foot [pes planus] (acquired), right foot: Secondary | ICD-10-CM

## 2014-01-31 MED ORDER — TRIAMCINOLONE ACETONIDE 10 MG/ML IJ SUSP
10.0000 mg | Freq: Once | INTRAMUSCULAR | Status: AC
  Administered 2014-01-31: 10 mg

## 2014-01-31 NOTE — Progress Notes (Signed)
   Subjective:    Patient ID: Kristina Pearson, female    DOB: 12/31/1955, 58 y.o.   MRN: [id number]  HPI Comments: "I am having trouble with the arches"  Patient c/o aching arch and dorsal foot bilateral, left over right, for few months. She states that after standing a lot it makes worse. She has to sit and elevate periodically throughout the day. She has tried change in shoes, OTC insoles, ice, stretching, and takes Celebrex for her arthritis.      Review of Systems  Cardiovascular:       Calf pain with standing for long periods   Musculoskeletal: Positive for arthralgias and back pain.  All other systems reviewed and are negative.      Objective:   Physical Exam        Assessment & Plan:

## 2014-01-31 NOTE — Progress Notes (Signed)
Subjective:     Patient ID: Kristina Pearson, female   DOB: 12-08-1955, 58 y.o.   MRN: [id number]  Foot Pain   patient presents stating I get pain in my foot and my leg and it's hurting on the bottom of the foot top of the foot and then in my leg it hurts mostly my left foot but some in my right foot and it's been going on for at least a few months   Review of Systems  All other systems reviewed and are negative.      Objective:   Physical Exam  Nursing note and vitals reviewed. Constitutional: She is oriented to person, place, and time.  Cardiovascular: Intact distal pulses.   Musculoskeletal: Normal range of motion.  Neurological: She is oriented to person, place, and time.  Skin: Skin is warm.   neurovascular status intact muscle strength adequate with range of motion subtalar midtarsal joint within normal limits. Patient's found to have sensitivity in her feet with discomfort in the mid arch area left over right and mild discomfort on the dorsum of the foot and also discomfort in the posterior tibial tendon. Patient is found to have normal muscle strength and is found to have mild discomfort posterior tibial tendons bilateral and well-perfused digits. Patient is also noted to have a mild limb length discrepancy with the right leg being a quarter inch shorter than the left     Assessment:     Tendinitis with fasciitis-like symptoms and possibility also for fibromyalgia-type symptoms    Plan:     H&P and x-rays reviewed and injected the mid arch area left 3 mg Kenalog 5 mm Xylocaine Marcaine mixture applied fascially brace and gave instructions long-term on orthotics to try to take stress off the plantar feet. We will see response to the first treatment and decide what appropriate for her and will return in 1 week

## 2014-02-02 ENCOUNTER — Ambulatory Visit (INDEPENDENT_AMBULATORY_CARE_PROVIDER_SITE_OTHER): Payer: 59 | Admitting: Obstetrics & Gynecology

## 2014-02-02 ENCOUNTER — Encounter: Payer: Self-pay | Admitting: Obstetrics & Gynecology

## 2014-02-02 VITALS — BP 92/66 | HR 60 | Resp 18 | Ht 66.0 in | Wt 131.0 lb

## 2014-02-02 DIAGNOSIS — N644 Mastodynia: Secondary | ICD-10-CM

## 2014-02-02 NOTE — Progress Notes (Signed)
Subjective:     Patient ID: Kristina Pearson, female   DOB: 03/03/1956, 58 y.o.   MRN: [id number]  HPI 58 yo G2P1 MWF here for recheck of breasts.  Reports pain has resolved and restarted and then stopped again.  Patient is much less anxious about it now as the pain did fully go away.  MMG will be due in Feb 2016.    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 Pt underwent partial right mastectomy with Dr. Dalbert Batman 08/30/13. Final pathology showed no DCIS but extensive changes were noted where biopsy had been done.  Pt reports she has finally gotten all the issues with her worker's compensation claim completed. This is a huge stressor that is now gone.  Pt is planning to receive injection in back at end of month.  This is not scheduled but she should be receiving a call about it.   She did go ahead and start her adjuvant therapy as advised from Dr. Jana Hakim.  She will discuss with her new oncologist in September when she has an appointment.   Review of Systems  All other systems reviewed and are negative.      Objective:   Physical Exam  Constitutional: She is oriented to person, place, and time. She appears well-developed and well-nourished.  Neck: No tracheal deviation present. No thyromegaly present.  Pulmonary/Chest: Right breast exhibits no inverted nipple, no mass, no nipple discharge, no skin change and no tenderness. Left breast exhibits no inverted nipple, no mass, no nipple discharge, no skin change and no tenderness. Breasts are symmetrical.  Lymphadenopathy:    She has no cervical adenopathy.  Neurological: She is alert and oriented to person, place, and time.  Psychiatric: She has a normal mood and affect.       Assessment:     Breast tenderness that is intermittent but currently resolved     Plan:     Pt will see oncologist in September MMG due 2/16 Pt knows to call with any new issue/concern and I will be happy to see her.

## 2014-02-05 ENCOUNTER — Telehealth: Payer: Self-pay | Admitting: Family Medicine

## 2014-02-05 DIAGNOSIS — N644 Mastodynia: Secondary | ICD-10-CM | POA: Insufficient documentation

## 2014-02-05 NOTE — Telephone Encounter (Signed)
Pt would like rx for compression panty hose length stocking. Pt has knee high length and they are working. Pt will put up rx.

## 2014-02-05 NOTE — Telephone Encounter (Signed)
rx is ready to pick up  

## 2014-02-06 NOTE — Telephone Encounter (Signed)
I spoke with pt  

## 2014-02-07 ENCOUNTER — Ambulatory Visit (INDEPENDENT_AMBULATORY_CARE_PROVIDER_SITE_OTHER): Payer: 59 | Admitting: Podiatry

## 2014-02-07 VITALS — BP 107/65 | HR 64 | Resp 16

## 2014-02-07 DIAGNOSIS — M779 Enthesopathy, unspecified: Secondary | ICD-10-CM

## 2014-02-07 NOTE — Progress Notes (Signed)
Subjective:     Patient ID: Kristina Pearson, female   DOB: 1955/07/30, 58 y.o.   MRN: [id number]  HPI patient states that the foot is quite a bit better and she needs orthotics at this time. Still is having moderate leg pain but even that seems a little bit improved   Review of Systems     Objective:   Physical Exam Neurovascular status intact with muscle strength adequate and patient noted to have moderate to mild discomfort in the insertion of the tendon into the navicular with inflammation plantar of a mild nature    Assessment:     Tendinitis that has improved with medication fascial brace    Plan:     At this time patient is scanned for custom orthotics to reduce stress against her foot and will be seen back for fitting

## 2014-02-08 ENCOUNTER — Ambulatory Visit (INDEPENDENT_AMBULATORY_CARE_PROVIDER_SITE_OTHER): Payer: 59 | Admitting: General Surgery

## 2014-02-09 ENCOUNTER — Ambulatory Visit (INDEPENDENT_AMBULATORY_CARE_PROVIDER_SITE_OTHER): Payer: 59 | Admitting: General Surgery

## 2014-02-19 ENCOUNTER — Telehealth: Payer: Self-pay | Admitting: Family Medicine

## 2014-02-19 MED ORDER — LISDEXAMFETAMINE DIMESYLATE 20 MG PO CAPS: 20.0000 mg | ORAL_CAPSULE | Freq: Every day | ORAL | Status: DC

## 2014-02-19 NOTE — Telephone Encounter (Signed)
Pt request refill of the following:   lisdexamfetamine (VYVANSE) 20 MG capsule   Pt lost her cell phone . Michela Pitcher you can put a message in [place] letting her know her rx is ready or call her home phone   Phamacy:

## 2014-02-19 NOTE — Telephone Encounter (Signed)
done

## 2014-02-20 ENCOUNTER — Other Ambulatory Visit: Payer: Self-pay

## 2014-02-20 ENCOUNTER — Other Ambulatory Visit: Payer: 59

## 2014-02-20 DIAGNOSIS — C50411 Malignant neoplasm of upper-outer quadrant of right female breast: Secondary | ICD-10-CM

## 2014-02-20 NOTE — Telephone Encounter (Signed)
Script is ready for pick up and I spoke with pt.  

## 2014-02-21 ENCOUNTER — Other Ambulatory Visit (HOSPITAL_BASED_OUTPATIENT_CLINIC_OR_DEPARTMENT_OTHER): Payer: 59

## 2014-02-21 DIAGNOSIS — C50411 Malignant neoplasm of upper-outer quadrant of right female breast: Secondary | ICD-10-CM

## 2014-02-21 DIAGNOSIS — D0590 Unspecified type of carcinoma in situ of unspecified breast: Secondary | ICD-10-CM

## 2014-02-21 LAB — CBC WITH DIFFERENTIAL/PLATELET
BASO%: 0.6 % (ref 0.0–2.0)
Basophils Absolute: 0 10*3/uL (ref 0.0–0.1)
EOS ABS: 0.2 10*3/uL (ref 0.0–0.5)
EOS%: 3.8 % (ref 0.0–7.0)
HCT: 36.4 % (ref 34.8–46.6)
HEMOGLOBIN: 12 g/dL (ref 11.6–15.9)
LYMPH#: 1 10*3/uL (ref 0.9–3.3)
LYMPH%: 20 % (ref 14.0–49.7)
MCH: 32.1 pg (ref 25.1–34.0)
MCHC: 32.9 g/dL (ref 31.5–36.0)
MCV: 97.7 fL (ref 79.5–101.0)
MONO#: 0.3 10*3/uL (ref 0.1–0.9)
MONO%: 6.4 % (ref 0.0–14.0)
NEUT%: 69.2 % (ref 38.4–76.8)
NEUTROS ABS: 3.3 10*3/uL (ref 1.5–6.5)
Platelets: 177 10*3/uL (ref 145–400)
RBC: 3.72 10*6/uL (ref 3.70–5.45)
RDW: 12.7 % (ref 11.2–14.5)
WBC: 4.8 10*3/uL (ref 3.9–10.3)

## 2014-02-21 LAB — COMPREHENSIVE METABOLIC PANEL (CC13)
ALBUMIN: 3.4 g/dL — AB (ref 3.5–5.0)
ALT: 22 U/L (ref 0–55)
AST: 21 U/L (ref 5–34)
Alkaline Phosphatase: 33 U/L — ABNORMAL LOW (ref 40–150)
Anion Gap: 7 mEq/L (ref 3–11)
BUN: 16.4 mg/dL (ref 7.0–26.0)
CO2: 27 meq/L (ref 22–29)
Calcium: 9 mg/dL (ref 8.4–10.4)
Chloride: 105 mEq/L (ref 98–109)
Creatinine: 1 mg/dL (ref 0.6–1.1)
Glucose: 99 mg/dl (ref 70–140)
POTASSIUM: 4.4 meq/L (ref 3.5–5.1)
SODIUM: 139 meq/L (ref 136–145)
TOTAL PROTEIN: 6.2 g/dL — AB (ref 6.4–8.3)
Total Bilirubin: 0.47 mg/dL (ref 0.20–1.20)

## 2014-02-27 ENCOUNTER — Ambulatory Visit: Payer: 59 | Admitting: Hematology and Oncology

## 2014-02-28 ENCOUNTER — Encounter: Payer: Self-pay | Admitting: Podiatry

## 2014-02-28 ENCOUNTER — Telehealth: Payer: Self-pay | Admitting: Family Medicine

## 2014-02-28 ENCOUNTER — Ambulatory Visit (INDEPENDENT_AMBULATORY_CARE_PROVIDER_SITE_OTHER): Payer: 59 | Admitting: Podiatry

## 2014-02-28 DIAGNOSIS — M7750 Other enthesopathy of unspecified foot: Secondary | ICD-10-CM

## 2014-02-28 NOTE — Patient Instructions (Signed)

## 2014-02-28 NOTE — Telephone Encounter (Signed)
Refill request for Alprazolam 0.5 mg and send to [facility].

## 2014-03-01 MED ORDER — ALPRAZOLAM 0.5 MG PO TABS: ORAL_TABLET | ORAL | Status: DC

## 2014-03-01 NOTE — Telephone Encounter (Signed)
rx is ready to be faxed  

## 2014-03-01 NOTE — Telephone Encounter (Signed)
Script was faxed to [facility].  

## 2014-03-01 NOTE — Progress Notes (Signed)
Subjective:     Patient ID: Kristina Pearson, female   DOB: 1955/11/15, 58 y.o.   MRN: [id number]  HPI patient presents to pickup orthotics and has numerous questions concerns about her pain in her left lower leg and whether or not she might have a vein issue   Review of Systems     Objective:   Physical Exam Neurovascular status unchanged with discomfort that occurs in the ankle and foot left over right with no Homans sign noted and mild varicosities in the ankle and lower leg region they do not appear puffy    Assessment:     Possibility for venous disease left with tendinitis    Plan:     Orthotics dispensed with instructions and she is going to go to the pain clinic on [address] and have evaluation of her leg performed

## 2014-03-02 ENCOUNTER — Other Ambulatory Visit: Payer: Self-pay | Admitting: *Deleted

## 2014-03-02 ENCOUNTER — Telehealth: Payer: Self-pay | Admitting: *Deleted

## 2014-03-02 NOTE — Telephone Encounter (Signed)
Returned pt's phone call about Tamoxifen. Pt's mail order of this med has not come in yet, so I will call some in for her to pick up at local pharmacy per Charlestine Massed, NP. Message to be forwarded to Charlestine Massed, NP.

## 2014-03-05 ENCOUNTER — Telehealth: Payer: Self-pay | Admitting: Hematology and Oncology

## 2014-03-05 NOTE — Telephone Encounter (Signed)
per r/s-cld pt to adv of appt-pt understood

## 2014-03-07 ENCOUNTER — Ambulatory Visit: Payer: 59 | Admitting: Hematology and Oncology

## 2014-03-08 ENCOUNTER — Encounter: Payer: Self-pay | Admitting: Hematology and Oncology

## 2014-03-08 ENCOUNTER — Ambulatory Visit (HOSPITAL_BASED_OUTPATIENT_CLINIC_OR_DEPARTMENT_OTHER): Payer: 59 | Admitting: Hematology and Oncology

## 2014-03-08 ENCOUNTER — Other Ambulatory Visit: Payer: Self-pay | Admitting: Family Medicine

## 2014-03-08 VITALS — BP 102/61 | HR 65 | Temp 97.8°F | Resp 20 | Ht 66.0 in | Wt 130.6 lb

## 2014-03-08 DIAGNOSIS — D0590 Unspecified type of carcinoma in situ of unspecified breast: Secondary | ICD-10-CM

## 2014-03-08 DIAGNOSIS — C50411 Malignant neoplasm of upper-outer quadrant of right female breast: Secondary | ICD-10-CM

## 2014-03-08 NOTE — Progress Notes (Signed)
Patient Care Team: Laurey Morale, MD as PCP - General  DIAGNOSIS: Breast cancer of upper-outer quadrant of right female breast   Primary site: Breast (Right)   Staging method: AJCC 7th Edition   Clinical: Stage 0 (Tis, N0, cM0)   Summary: Stage 0 (Tis, N0, cM0)   Clinical comments: Staged at breast conference 08/23/13  SUMMARY OF ONCOLOGIC HISTORY:  #1 07/26/2013 patient underwent a screening mammogram and was found to have a new area of calcifications in the right breast. Additional views on 08/09/2013 revealed a 1 cm area within the upper outer quadrant of the right breast. On 08/15/2013 patient had a stereotactic core biopsy performed. 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 This was low grade. 08/21/2013 MRI of the breasts showed biopsy changes no other lesions.   #2 patient is status post lumpectomy of the right breast on 08/30/2013.Marland Kitchen The final pathology reveals no evidence of residual ductal carcinoma in situ. 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   #3 patient has been seen by Dr. Arloa Koh and completed adjuvant radiation therapy on 11/09/13.  4. Tamoxifen 20 mg daily since 11/21/2013  CHIEF COMPLIANT: Followup of breast cancer of tamoxifen was started in June  INTERVAL HISTORY: Kristina Pearson is a 58 year old Caucasian lady with above-mentioned history of DCIS that was only identifiable on the initial biopsy. The final pathology did not have any additional DCIS. She has since received adjuvant radiation therapy and started tamoxifen in June 2015. She has been tolerating it very well without any major problems or concerns. Her major complaints of related to fogginess in the head but she attributes that to tamoxifen. She is also complaining of the recent history of phlebitis on her lower legs. Her primary care physician ordered that she had tender vein and told her to start her aspirin therapy. Since she has been on aspirin she is being doing very well.  She denies any further problems with phlebitis. Denies any major problems with tamoxifen x5 flashes. She does complain of arthritis in her legs and her back. But her arthritis symptoms has gotten significantly better.   REVIEW OF SYSTEMS:   Constitutional: Denies fevers, chills or abnormal weight loss Eyes: Denies blurriness of vision Ears, nose, mouth, throat, and face: Denies mucositis or sore throat Respiratory: Denies cough, dyspnea or wheezes Cardiovascular: Denies palpitation, chest discomfort or lower extremity swelling Gastrointestinal:  Denies nausea, heartburn or change in bowel habits Skin: Denies abnormal skin rashes; superficial phlebitis in left leg Lymphatics: Denies new lymphadenopathy or easy bruising Neurological:Denies numbness, tingling or new weaknesses; fogginess in head Behavioral/Psych: Mood is stable, no new changes  Breast:  denies any pain or lumps or nodules in either breasts All other systems were reviewed with the patient and are negative.  I have reviewed the past medical history, past surgical history, social history and family history with the patient and they are unchanged from previous note.  ALLERGIES:  has No Known Allergies.  MEDICATIONS:  Current Outpatient Prescriptions  Medication Sig Dispense Refill  . ALPRAZolam (XANAX) 0.5 MG tablet TAKE 1 TABLET BY MOUTH TWICE A DAY AS NEEDED FOR ANXIETY  180 tablet  1  . Calcium Carbonate-Vitamin D (CALCIUM 600+D) 600-400 MG-UNIT per tablet Take 1 tablet by mouth 2 (two) times daily. And Vitamin K      . celecoxib (CELEBREX) 200 MG capsule Take 1 capsule (200 mg total) by mouth 2 (two) times daily.  180 capsule  3  . CRANBERRY EXTRACT  PO Take 1 tablet by mouth daily as needed.       . cycloSPORINE (RESTASIS) 0.05 % ophthalmic emulsion Place 1 drop into both eyes 2 (two) times daily.      Marland Kitchen HYDROcodone-acetaminophen (NORCO) 10-325 MG per tablet Take 1 tablet by mouth every 6 (six) hours as needed for severe  pain.  120 tablet  0  . KRILL OIL PO Take by mouth daily.      Marland Kitchen levothyroxine (SYNTHROID, LEVOTHROID) 112 MCG tablet TAKE 1 TABLET DAILY  90 tablet  1  . lisdexamfetamine (VYVANSE) 20 MG capsule Take 1 capsule (20 mg total) by mouth daily.  30 capsule  0  . methocarbamol (ROBAXIN) 500 MG tablet Take 1 tablet (500 mg total) by mouth every 6 (six) hours as needed for muscle spasms.  120 tablet  5  . Multiple Vitamin (MULTIVITAMIN) tablet Take 1 tablet by mouth daily.        . ranitidine (ZANTAC) 300 MG tablet Take 1 tablet (300 mg total) by mouth 2 (two) times daily.  180 tablet  3  . tamoxifen (NOLVADEX) 20 MG tablet Take 1 tablet (20 mg total) by mouth daily.  30 tablet  3  . traZODone (DESYREL) 100 MG tablet TAKE 2 TABLETS EVERY NIGHT  60 tablet  8   No current facility-administered medications for this visit.    PHYSICAL EXAMINATION: ECOG PERFORMANCE STATUS: 0 - Asymptomatic  Filed Vitals:   03/08/14 1325  BP: 102/61  Pulse: 65  Temp: 97.8 F (36.6 C)  Resp: 20   Filed Weights   03/08/14 1325  Weight: 130 lb 9.6 oz (59.24 kg)    GENERAL:alert, no distress and comfortable SKIN: skin color, texture, turgor are normal, no rashes or significant lesions EYES: normal, Conjunctiva are pink and non-injected, sclera clear OROPHARYNX:no exudate, no erythema and lips, buccal mucosa, and tongue normal  NECK: supple, thyroid normal size, non-tender, without nodularity LYMPH:  no palpable lymphadenopathy in the cervical, axillary or inguinal LUNGS: clear to auscultation and percussion with normal breathing effort HEART: regular rate & rhythm and no murmurs and no lower extremity edema ABDOMEN:abdomen soft, non-tender and normal bowel sounds Musculoskeletal:no cyanosis of digits and no clubbing : No palpable superficial phlebitis noted on today's exam, arthritis in her hands back and legs NEURO: alert & oriented x 3 with fluent speech, no focal motor/sensory deficits    LABORATORY  DATA:  I have reviewed the data as listed   Chemistry      Component Value Date/Time   NA 139 02/21/2014 1111   NA 141 11/26/2011 1223   K 4.4 02/21/2014 1111   K 4.7 11/26/2011 1223   CL 107 11/26/2011 1223   CO2 27 02/21/2014 1111   CO2 28 11/26/2011 1223   BUN 16.4 02/21/2014 1111   BUN 14 11/26/2011 1223   CREATININE 1.0 02/21/2014 1111   CREATININE 1.0 11/26/2011 1223      Component Value Date/Time   CALCIUM 9.0 02/21/2014 1111   CALCIUM 9.1 11/26/2011 1223   ALKPHOS 33* 02/21/2014 1111   ALKPHOS 39 11/26/2011 1223   AST 21 02/21/2014 1111   AST 21 11/26/2011 1223   ALT 22 02/21/2014 1111   ALT 17 11/26/2011 1223   BILITOT 0.47 02/21/2014 1111   BILITOT 0.7 11/26/2011 1223       Lab Results  Component Value Date   WBC 4.8 02/21/2014   HGB 12.0 02/21/2014   HCT 36.4 02/21/2014   MCV 97.7 02/21/2014  PLT 177 02/21/2014   NEUTROABS 3.3 02/21/2014     RADIOGRAPHIC STUDIES: I have personally reviewed the radiology reports and agreed with their findings. No results found.   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 She had a recent episode of superficial phlebitis in the leg that was diagnosed clinically. She was given baby aspirin and she has been doing much better since that time. She is very concerned about the risk of blood clots. She also complains of fogginess in the head.  Survivorship:Discussed the importance of physical exercise in decreasing the likelihood of breast cancer recurrence. Recommended 30 mins daily 6 days a week of either brisk walking or cycling or swimming. Encouraged patient to eat more fruits and vegetables and decrease red meat. Patient exercises every day and swims routinely and stays active.  Surveillance: Recommended mammogram to be done in February and we will see her back in March to do a physical  exam and breast exam at that time.     Orders Placed This Encounter  Procedures  . MM Digital Diagnostic Bilat    Standing Status: Future     Number of Occurrences:      Standing Expiration Date: 03/08/2015    Order Specific Question:  Reason for Exam (SYMPTOM  OR DIAGNOSIS REQUIRED)    Answer:  H/O Right DCIS annual follow up    Order Specific Question:  Is the patient pregnant?    Answer:  No    Order Specific Question:  Preferred imaging location?    Answer:  [facility]   The patient has a good understanding of the overall plan. she agrees with it. She will call with any problems that may develop before her next visit here.  I spent 20 minutes counseling the patient face to face. The total time spent in the appointment was 25 minutes and more than 50% was on counseling and review of test results    Rulon Eisenmenger, MD 03/08/2014 2:09 PM

## 2014-03-08 NOTE — Assessment & Plan Note (Signed)
Right breast DCIS ER/PR positive: Patient is currently on antiestrogen therapy with tamoxifen and is tolerating it very well without any major problems. She had a recent episode of superficial phlebitis in the leg that was diagnosed clinically. She was given baby aspirin and she has been doing much better since that time. She is very concerned about the risk of blood clots. She also complains of fogginess in the head.  Survivorship:Discussed the importance of physical exercise in decreasing the likelihood of breast cancer recurrence. Recommended 30 mins daily 6 days a week of either brisk walking or cycling or swimming. Encouraged patient to eat more fruits and vegetables and decrease red meat. Patient exercises every day and swims routinely and stays active.  Surveillance: Recommended mammogram to be done in February and we will see her back in March to do a physical exam and breast exam at that time.

## 2014-03-09 ENCOUNTER — Telehealth: Payer: Self-pay | Admitting: Hematology and Oncology

## 2014-03-09 NOTE — Telephone Encounter (Signed)
Pt is waiting on a mail order, can we call in to local pharmacy?

## 2014-03-09 NOTE — Telephone Encounter (Signed)
, °

## 2014-03-09 NOTE — Telephone Encounter (Signed)
Call in #[phone] with no rf  

## 2014-03-16 ENCOUNTER — Telehealth: Payer: Self-pay | Admitting: *Deleted

## 2014-03-16 NOTE — Telephone Encounter (Signed)
I have been unsuccessful in using my inserts.  Even though I've done it gradually a little bit every day, it's causing a lot of pain in my hips and lower back and knee.  I've decided to hold off for the time being until I see him in March and see if there is something that can be actually done so that I can use them.  Thank you.

## 2014-03-18 ENCOUNTER — Other Ambulatory Visit: Payer: Self-pay | Admitting: Family Medicine

## 2014-03-21 ENCOUNTER — Telehealth: Payer: Self-pay | Admitting: Family Medicine

## 2014-03-21 NOTE — Telephone Encounter (Signed)
Pt is having leg pain she wants to be seen for, needs to discuss her ADD med and get a flu shot.

## 2014-03-21 NOTE — Telephone Encounter (Signed)
Okay to schedule

## 2014-03-21 NOTE — Telephone Encounter (Signed)
I returned her call and apologized for the delayed response.  I told her that she probably needs to come back in for an appointment, he may need to make some adjustments to the orthotics.  She stated, "I want to hold off on doing that.  I'm having problems with my back, I just received a cortisone shot in it and I have Phlebitis in my leg as well as varicose veins.  I think the lift on the right orthotic may be contributing to my problem.  I don't want to do anything with the orthotics right now until I get the other issues under control. Those co-pays also seem to add up, I've seen him 3 times already.  I think I have an appointment in about 3 weeks.  Hopefully I will be doing better by then."  I told her that is fine.  I hope everything goes well with the other treatments.

## 2014-03-22 NOTE — Telephone Encounter (Signed)
appt scheduled/ non urgent

## 2014-03-26 ENCOUNTER — Ambulatory Visit (INDEPENDENT_AMBULATORY_CARE_PROVIDER_SITE_OTHER): Payer: 59 | Admitting: Family Medicine

## 2014-03-26 ENCOUNTER — Encounter: Payer: Self-pay | Admitting: Family Medicine

## 2014-03-26 VITALS — BP 105/72 | HR 73 | Temp 98.4°F | Ht 66.0 in | Wt 131.0 lb

## 2014-03-26 DIAGNOSIS — J0100 Acute maxillary sinusitis, unspecified: Secondary | ICD-10-CM

## 2014-03-26 DIAGNOSIS — I83009 Varicose veins of unspecified lower extremity with ulcer of unspecified site: Secondary | ICD-10-CM

## 2014-03-26 DIAGNOSIS — F900 Attention-deficit hyperactivity disorder, predominantly inattentive type: Secondary | ICD-10-CM

## 2014-03-26 DIAGNOSIS — L97909 Non-pressure chronic ulcer of unspecified part of unspecified lower leg with unspecified severity: Secondary | ICD-10-CM

## 2014-03-26 MED ORDER — LISDEXAMFETAMINE DIMESYLATE 30 MG PO CAPS: 30.0000 mg | ORAL_CAPSULE | Freq: Every day | ORAL | Status: DC

## 2014-03-26 MED ORDER — AZITHROMYCIN 250 MG PO TABS: ORAL_TABLET | ORAL | Status: DC

## 2014-03-26 MED ORDER — ASPIRIN EC 81 MG PO TBEC: 81.0000 mg | DELAYED_RELEASE_TABLET | Freq: Every day | ORAL | Status: AC

## 2014-03-26 NOTE — Progress Notes (Signed)
Pre visit review using our clinic review tool, if applicable. No additional management support is needed unless otherwise documented below in the visit note. 

## 2014-03-26 NOTE — Progress Notes (Signed)
   Subjective:    Patient ID: Kristina Pearson, female    DOB: 05-13-1956, 58 y.o.   MRN: [id number]  HPI Here for several things. First about one week ago she developed sinus pressure, PND, and coughing up green sputum. Also she has been on Vyvanse 20 mg daily for several years and wants to increase the dose. It works for about 3 hours and then wears off. No side effects to report.    Review of Systems  Constitutional: Negative.   HENT: Positive for congestion, postnasal drip and sinus pressure.   Eyes: Negative.   Respiratory: Positive for cough.   Neurological: Negative.   Psychiatric/Behavioral: Positive for decreased concentration.       Objective:   Physical Exam  Constitutional: She is oriented to person, place, and time. She appears well-developed and well-nourished.  HENT:  Right Ear: External ear normal.  Left Ear: External ear normal.  Nose: Nose normal.  Mouth/Throat: Oropharynx is clear and moist.  Eyes: Conjunctivae are normal.  Pulmonary/Chest: Effort normal and breath sounds normal.  Lymphadenopathy:    She has no cervical adenopathy.  Neurological: She is alert and oriented to person, place, and time.          Assessment & Plan:  Treat the sinusitis with a Zpack. Increase Vyvanse to 30 mg daily.

## 2014-04-04 ENCOUNTER — Ambulatory Visit: Payer: 59 | Admitting: Podiatry

## 2014-04-05 ENCOUNTER — Telehealth: Payer: Self-pay | Admitting: Family Medicine

## 2014-04-05 NOTE — Telephone Encounter (Signed)
Pt states DR Sarajane Jews was to schedule her doppler on both legs for swelling and discomfort from appt on 10/5.Marland Kitchen pls advise

## 2014-04-06 NOTE — Addendum Note (Signed)
Addended by: [name] A on: 04/06/2014 09:03 AM   Modules accepted: Orders

## 2014-04-06 NOTE — Telephone Encounter (Signed)
This was ordered today.

## 2014-04-06 NOTE — Progress Notes (Signed)
   Subjective:    Patient ID: Kristina Pearson, female    DOB: December 07, 1955, 58 y.o.   MRN: [id number]  HPI    Review of Systems     Objective:   Physical Exam        Assessment & Plan:

## 2014-04-06 NOTE — Telephone Encounter (Signed)
I spoke with pt  

## 2014-04-10 ENCOUNTER — Ambulatory Visit (HOSPITAL_COMMUNITY): Payer: 59 | Attending: Cardiovascular Disease | Admitting: *Deleted

## 2014-04-10 DIAGNOSIS — M79604 Pain in right leg: Secondary | ICD-10-CM | POA: Diagnosis not present

## 2014-04-10 DIAGNOSIS — Z87891 Personal history of nicotine dependence: Secondary | ICD-10-CM | POA: Insufficient documentation

## 2014-04-10 DIAGNOSIS — M7989 Other specified soft tissue disorders: Secondary | ICD-10-CM | POA: Diagnosis not present

## 2014-04-10 DIAGNOSIS — I83009 Varicose veins of unspecified lower extremity with ulcer of unspecified site: Secondary | ICD-10-CM

## 2014-04-10 DIAGNOSIS — M79605 Pain in left leg: Secondary | ICD-10-CM | POA: Diagnosis not present

## 2014-04-10 DIAGNOSIS — L97909 Non-pressure chronic ulcer of unspecified part of unspecified lower leg with unspecified severity: Secondary | ICD-10-CM

## 2014-04-10 DIAGNOSIS — M79606 Pain in leg, unspecified: Secondary | ICD-10-CM

## 2014-04-10 NOTE — Progress Notes (Signed)
Venous Duplex Lower Bilateral Performed 

## 2014-04-11 ENCOUNTER — Ambulatory Visit (INDEPENDENT_AMBULATORY_CARE_PROVIDER_SITE_OTHER): Payer: 59 | Admitting: General Surgery

## 2014-04-13 ENCOUNTER — Ambulatory Visit (INDEPENDENT_AMBULATORY_CARE_PROVIDER_SITE_OTHER): Payer: 59 | Admitting: *Deleted

## 2014-04-13 DIAGNOSIS — Z23 Encounter for immunization: Secondary | ICD-10-CM

## 2014-04-18 ENCOUNTER — Ambulatory Visit: Payer: 59 | Admitting: Podiatry

## 2014-04-23 ENCOUNTER — Encounter: Payer: Self-pay | Admitting: Family Medicine

## 2014-05-21 ENCOUNTER — Telehealth: Payer: Self-pay

## 2014-05-21 NOTE — Telephone Encounter (Signed)
I spoke with pt and she would prefer to try and stay on the Celebrex, she is afraid that the below medications may bother her stomach. Also she is still having some pain in the left leg, results from Ultrasound were negative. What else can you recommend?

## 2014-05-21 NOTE — Telephone Encounter (Signed)
Have her make an OV to talk about our options

## 2014-05-21 NOTE — Telephone Encounter (Signed)
Received a call from [facility] stating that pt's celecoxib (CELEBREX) 200 MG capsule 200 mg, 2 times daily is not a preferred medicaiton and will need review.  Per Shirlean Mylar Ibuprofen, Naproxen, Meloxicam, and Etodolac are all covered under pt's plan and would like to know if pt can be switched.  Pls advise.

## 2014-05-22 NOTE — Telephone Encounter (Signed)
I spoke with pt and she will schedule a follow up if her leg is still bothering her. Can you start a prior authorization for the Celebrex? Pt has problems taking certain medications because of stomach issues.

## 2014-05-22 NOTE — Telephone Encounter (Signed)
Do you know from the preferred list of medications patient can't tolerate??  I will need that information to get the PA approved.

## 2014-05-22 NOTE — Telephone Encounter (Signed)
She does tolerate any other NSAID (including Ibuprofen, naproxen, meloxicam, etodolac, diclofenac, etc.)

## 2014-05-22 NOTE — Telephone Encounter (Signed)
PA has been APPROVED.  04/22/14-05/22/15 case id# [id number]

## 2014-05-23 MED ORDER — CELECOXIB 200 MG PO CAPS: 200.0000 mg | ORAL_CAPSULE | Freq: Two times a day (BID) | ORAL | Status: DC

## 2014-05-23 NOTE — Telephone Encounter (Signed)
I sent script e-scribe for Celebrex to [facility].

## 2014-05-26 ENCOUNTER — Other Ambulatory Visit: Payer: Self-pay | Admitting: Family Medicine

## 2014-06-01 ENCOUNTER — Other Ambulatory Visit: Payer: Self-pay | Admitting: Adult Health

## 2014-06-01 ENCOUNTER — Encounter: Payer: Self-pay | Admitting: Hematology and Oncology

## 2014-06-05 ENCOUNTER — Other Ambulatory Visit: Payer: Self-pay

## 2014-06-05 ENCOUNTER — Ambulatory Visit (INDEPENDENT_AMBULATORY_CARE_PROVIDER_SITE_OTHER): Payer: 59 | Admitting: Obstetrics & Gynecology

## 2014-06-05 VITALS — BP 120/72 | HR 64 | Resp 12 | Wt 131.2 lb

## 2014-06-05 DIAGNOSIS — N644 Mastodynia: Secondary | ICD-10-CM

## 2014-06-05 DIAGNOSIS — C50411 Malignant neoplasm of upper-outer quadrant of right female breast: Secondary | ICD-10-CM

## 2014-06-05 MED ORDER — TERCONAZOLE 0.4 % VA CREA: 1.0000 | TOPICAL_CREAM | Freq: Every day | VAGINAL | Status: DC

## 2014-06-05 MED ORDER — TAMOXIFEN CITRATE 20 MG PO TABS: 20.0000 mg | ORAL_TABLET | Freq: Every day | ORAL | Status: DC

## 2014-06-05 NOTE — Progress Notes (Signed)
Subjective:     Patient ID: Kristina Pearson, female   DOB: 1956-01-30, 58 y.o.   MRN: [id number]  HPI 58 yo G2P1 MWF here for recheck of breasts. Pt continues to have intermittent pain and tenderness on the right breast.  No nipple discharge.  Does have a lot of anxiety about this.  MMG will be due in Feb 2016. 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 H/O partial right mastectomy with Dr. Dalbert Batman 08/30/13. Final pathology showed no DCIS but extensive changes were noted where biopsy had been done.  Still having back pain.  Did start injections for pain.  First injection helped but second didn't.  She has a third one scheduled.    Pt reports recent antibiotic use and now having vaginal discharge with itching.  Did decide to start Tamoxifen.  Started in the summer.    Review of Systems  All other systems reviewed and are negative.      Objective:   Physical Exam  Constitutional: She appears well-developed and well-nourished.  Neck: Normal range of motion. Neck supple. No tracheal deviation present. No thyromegaly present.  Pulmonary/Chest: Right breast exhibits tenderness. Right breast exhibits no inverted nipple, no mass, no nipple discharge and no skin change. Left breast exhibits no inverted nipple, no mass, no nipple discharge, no skin change and no tenderness. Breasts are asymmetrical (due to partial mastectomy on right).    Genitourinary: There is no rash, tenderness or lesion on the right labia. There is no rash, tenderness or lesion on the left labia. Vaginal discharge (white, adherent) found.  Lymphadenopathy:    She has no cervical adenopathy.       Right: No inguinal adenopathy present.       Left: No inguinal adenopathy present.       Assessment:     Breast pain, s/p partial right mastectomy/radiaiton, now on Tamoxifen Back pain Yeast vaginitis    Plan:     Terazol 7 qhs x 7days.  Rx to pharmacy Pt getting more comfortable with her breast pain which is part of her  "new normal". Will continue SBE and call with any concerns, new findings. AEX scheduled 4/16.

## 2014-06-05 NOTE — Telephone Encounter (Signed)
Noted collaborative nurse [facility] today.  Awaiting clarification about the [facility] [address].

## 2014-06-07 ENCOUNTER — Encounter: Payer: Self-pay | Admitting: Obstetrics & Gynecology

## 2014-06-17 ENCOUNTER — Other Ambulatory Visit: Payer: Self-pay | Admitting: Obstetrics & Gynecology

## 2014-06-18 ENCOUNTER — Telehealth: Payer: Self-pay | Admitting: Obstetrics & Gynecology

## 2014-06-18 NOTE — Telephone Encounter (Signed)
Spoke with patient. Patient states that she came in to see Dr.Miller and was given Terazol cream for a yeast infection. "I had been trying to self treat it with OTC and it was not working. It was making it worse. The medication Dr.Miller gave me was helping but when I completed it my symptoms started to come back some." Is having vaginal itching. "It has improved but is still there and I feel like the outside of my urethra is irritated." Has been taking 1 cranberry tablet per day with lots of water and probiotics. "I think it was so bad from me self treating it that I may need another dose." Patient completed med on 12/21. Advised patient would send a message over to [facility] and return call with further recommendations and instructions.

## 2014-06-18 NOTE — Telephone Encounter (Signed)
Pt says she still has symptoms of a yeast infection after finishing all the medication.  Please call to advise.

## 2014-06-19 MED ORDER — TERCONAZOLE 0.4 % VA CREA
1.0000 | TOPICAL_CREAM | Freq: Every day | VAGINAL | Status: DC
Start: 2014-06-19 — End: 2014-08-13

## 2014-06-19 NOTE — Telephone Encounter (Signed)
Attempted to reach patient at number provided [phone]. No answer and recording states "The person you are trying to reach has a voicemail box that has not been set up yet. Please try your call again later. Goodbye."

## 2014-06-19 NOTE — Telephone Encounter (Signed)
Spoke with patient. Advised patient of message as seen below from [place]. "I really think I just need another refill on the yeast medicine. Today I am even more itchy. It is driving me crazy. I think I had the infection for so long that it just has not gone away." Advised patient to try OTC replens as symptoms may be related to vaginal dryness and return call if symptoms are not relieved. "I will go ahead and start the replens but if she would give me a refill that will be great." Advised patient refill is not indicated at this time. If still having symptoms with replens will need to contact the office. Advised if any further recommendations from [facility] will return call. Patient is agreeable.  Dr.Miller, anything further for this patient?

## 2014-06-19 NOTE — Telephone Encounter (Signed)
[facility]  If symptoms return after this treatment, please ask her to call for an appointment.  Thanks.  Ok to close encounter.

## 2014-06-19 NOTE — Telephone Encounter (Signed)
This may just be vaginal dryness due to hormonal changes.  I think should try replens vaginal moisturizer.  This is OTC.  Use at least twice weekly.  If symptoms persist after using for two weeks, needs OV again.  Thanks.

## 2014-06-20 ENCOUNTER — Ambulatory Visit (INDEPENDENT_AMBULATORY_CARE_PROVIDER_SITE_OTHER): Payer: 59 | Admitting: Family Medicine

## 2014-06-20 ENCOUNTER — Encounter: Payer: Self-pay | Admitting: Family Medicine

## 2014-06-20 VITALS — BP 112/71 | HR 74 | Temp 99.1°F | Ht 66.0 in | Wt 138.0 lb

## 2014-06-20 DIAGNOSIS — M2142 Flat foot [pes planus] (acquired), left foot: Secondary | ICD-10-CM

## 2014-06-20 DIAGNOSIS — F900 Attention-deficit hyperactivity disorder, predominantly inattentive type: Secondary | ICD-10-CM

## 2014-06-20 DIAGNOSIS — M2141 Flat foot [pes planus] (acquired), right foot: Secondary | ICD-10-CM

## 2014-06-20 DIAGNOSIS — N390 Urinary tract infection, site not specified: Secondary | ICD-10-CM

## 2014-06-20 LAB — POCT URINALYSIS DIPSTICK
BILIRUBIN UA: NEGATIVE
GLUCOSE UA: NEGATIVE
Ketones, UA: NEGATIVE
Leukocytes, UA: NEGATIVE
Nitrite, UA: NEGATIVE
Protein, UA: NEGATIVE
RBC UA: NEGATIVE
SPEC GRAV UA: 1.01
Urobilinogen, UA: 0.2
pH, UA: 7.5

## 2014-06-20 MED ORDER — CIPROFLOXACIN HCL 500 MG PO TABS: 500.0000 mg | ORAL_TABLET | Freq: Two times a day (BID) | ORAL | Status: DC

## 2014-06-20 MED ORDER — LISDEXAMFETAMINE DIMESYLATE 40 MG PO CAPS: 40.0000 mg | ORAL_CAPSULE | ORAL | Status: DC

## 2014-06-20 NOTE — Telephone Encounter (Signed)
Attempted to reach patient at number provided [phone]. No answer and recording states "The person you are trying to reach has a voicemail box that has not been set up yet. Please try your call again later. Goodbye."

## 2014-06-20 NOTE — Progress Notes (Signed)
Pre visit review using our clinic review tool, if applicable. No additional management support is needed unless otherwise documented below in the visit note. 

## 2014-06-20 NOTE — Addendum Note (Signed)
Addended by: Aggie Hacker A on: 06/20/2014 05:20 PM   Modules accepted: Orders

## 2014-06-20 NOTE — Progress Notes (Signed)
   Subjective:    Patient ID: Kristina Pearson, female    DOB: Oct 01, 1955, 58 y.o.   MRN: [id number]  HPI Here for 3 things. First she thinks she has a UTI since she has had urinary burning and urgency for the past 3 days. She took 2 doses of Cipro. Also she wants to increase the dose of her Vyvanse,. Also she asks about pain in the arch of the left foot. She was diagnoses with fasciitis some years ago and saw a podiatrist. He made her some inserts but she stopped wearing them a long time ago.   Review of Systems  Constitutional: Negative.   Genitourinary: Positive for dysuria, urgency and frequency. Negative for hematuria, flank pain and pelvic pain.  Neurological: Negative.   Psychiatric/Behavioral: Positive for decreased concentration.       Objective:   Physical Exam  Constitutional: She appears well-developed and well-nourished.  Abdominal: Soft. Bowel sounds are normal. She exhibits no distension and no mass. There is no tenderness. There is no rebound and no guarding.  Musculoskeletal:  She is tender along the medial left arch   Psychiatric: She has a normal mood and affect. Her behavior is normal. Thought content normal.          Assessment & Plan:  Treat the UTI with a full 7 days of Cipro and culture the sample. She does have plantar fasciitis so she needs to see her podiatrist to have a replacement set of arch supports made. Increase Vyvanse to 40 mg daily.

## 2014-06-20 NOTE — Telephone Encounter (Signed)
Attempted to reach patient at number provided [phone]. No answer and recording states "The person you are trying to reach has a voicemail box that has not been set up yet. Please try your call again later. Goodbye."

## 2014-06-21 NOTE — Telephone Encounter (Signed)
Attempted to reach patient at number provided [phone]. No answer and recording states "The person you are trying to reach has a voicemail box that has not been set up yet. Please try your call again later. Goodbye." Left detailed message at home number [phone]. Okay per ROI. Advised refill for prescription has been sent in to pharmacy. Call back with any questions.  Routing to provider for final review. Patient agreeable to disposition. Will close encounter

## 2014-06-22 LAB — URINE CULTURE
COLONY COUNT: NO GROWTH
Organism ID, Bacteria: NO GROWTH

## 2014-07-24 ENCOUNTER — Telehealth: Payer: Self-pay | Admitting: Family Medicine

## 2014-07-24 MED ORDER — LISDEXAMFETAMINE DIMESYLATE 40 MG PO CAPS: 40.0000 mg | ORAL_CAPSULE | ORAL | Status: DC

## 2014-07-24 NOTE — Telephone Encounter (Signed)
Pt request refill sdexamfetamine (VYVANSE) 40 MG capsule Pt states this new dosage is working/helping.  Ever how scripts many dr wants to give is ok.

## 2014-07-24 NOTE — Telephone Encounter (Signed)
done

## 2014-07-25 NOTE — Telephone Encounter (Signed)
Script is ready for pick up and I spoke with pt.  

## 2014-08-08 ENCOUNTER — Other Ambulatory Visit: Payer: Self-pay | Admitting: Hematology and Oncology

## 2014-08-08 ENCOUNTER — Ambulatory Visit
Admission: RE | Admit: 2014-08-08 | Discharge: 2014-08-08 | Disposition: A | Discharge status: Home / Self Care | Payer: 59 | Source: Ambulatory Visit | Attending: Hematology and Oncology | Admitting: Hematology and Oncology

## 2014-08-08 ENCOUNTER — Ambulatory Visit
Admission: RE | Admit: 2014-08-08 | Discharge: 2014-08-08 | Disposition: A | Discharge status: Home / Self Care | Payer: Self-pay | Source: Ambulatory Visit | Attending: Hematology and Oncology | Admitting: Hematology and Oncology

## 2014-08-08 DIAGNOSIS — C50411 Malignant neoplasm of upper-outer quadrant of right female breast: Secondary | ICD-10-CM

## 2014-08-08 DIAGNOSIS — N644 Mastodynia: Secondary | ICD-10-CM

## 2014-08-13 ENCOUNTER — Ambulatory Visit (HOSPITAL_BASED_OUTPATIENT_CLINIC_OR_DEPARTMENT_OTHER): Payer: 59 | Admitting: Hematology and Oncology

## 2014-08-13 ENCOUNTER — Telehealth: Payer: Self-pay | Admitting: Hematology and Oncology

## 2014-08-13 VITALS — BP 103/67 | HR 86 | Temp 98.5°F | Resp 17 | Ht 66.0 in | Wt 134.6 lb

## 2014-08-13 DIAGNOSIS — D0511 Intraductal carcinoma in situ of right breast: Secondary | ICD-10-CM

## 2014-08-13 DIAGNOSIS — C50411 Malignant neoplasm of upper-outer quadrant of right female breast: Secondary | ICD-10-CM

## 2014-08-13 DIAGNOSIS — Z170 Estrogen receptor positive status [ER+]: Secondary | ICD-10-CM

## 2014-08-13 NOTE — Progress Notes (Signed)
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 Valere Dross   11/21/2013 -  Anti-estrogen oral therapy Tamoxifen 20 mg daily    CHIEF COMPLIANT: Follow-up on tamoxifen  INTERVAL HISTORY: Kristina Pearson is a 59 year old with above-mentioned history of DCIS in the right breast status post lumpectomy and adjuvant radiation. She is currently on tamoxifen. Overall she is tolerating tamoxifen fairly well except for the muscle tenderness especially in the pectoralis major. Denies any severe hot flashes or any other myalgias. She has an area of tenderness in the left leg that extends from the ankle all the way up to the knee and behind. She had Doppler ultrasound October 2015 which was normal.  REVIEW OF SYSTEMS:   Constitutional: Denies fevers, chills or abnormal weight loss Eyes: Denies blurriness of vision Ears, nose, mouth, throat, and face: Denies mucositis or sore throat Respiratory: Denies cough, dyspnea or wheezes Cardiovascular: Denies palpitation, chest discomfort or lower extremity swelling Gastrointestinal:  Denies nausea, heartburn or change in bowel habits Skin: Denies abnormal skin rashes Lymphatics: Denies new lymphadenopathy or easy bruising Neurological:Denies numbness, tingling or new weaknesses Behavioral/Psych: Mood is stable, no new changes  Breast: Tenderness and chest  wall both sides All other systems were reviewed with the patient and are negative.  I have reviewed the past medical history, past surgical history, social history and family history with the patient and they are unchanged from previous note.  ALLERGIES:  has No Known Allergies.  MEDICATIONS:  Current Outpatient Prescriptions  Medication Sig Dispense Refill  . ALPRAZolam (XANAX) 0.5 MG tablet TAKE 1 TABLET BY MOUTH TWICE A DAY AS NEEDED 30 tablet 0  . aspirin EC 81 MG tablet Take 1 tablet (81 mg total) by mouth daily. 1 tablet 0  . Calcium Carbonate-Vitamin D (CALCIUM 600+D) 600-400 MG-UNIT per tablet Take 1 tablet by mouth 2 (two) times daily. And Vitamin K    . celecoxib (CELEBREX) 200 MG capsule Take 1 capsule (200 mg total) by mouth 2 (two) times daily. 180 capsule 3  . CRANBERRY EXTRACT PO Take 1 tablet by mouth daily as needed.     . cycloSPORINE (RESTASIS) 0.05 % ophthalmic emulsion Place 1 drop into both eyes 2 (two) times daily.    Marland Kitchen HYDROcodone-acetaminophen (NORCO) 10-325 MG per tablet Take 1 tablet by mouth every 6 (six) hours as needed for severe pain. 120 tablet 0  . KRILL OIL PO Take by mouth daily.    Marland Kitchen levothyroxine (SYNTHROID, LEVOTHROID) 112 MCG tablet TAKE 1 TABLET DAILY (CALL OFFICE FOR APPOINTMENT AND LABWORK) 90 tablet 0  . lisdexamfetamine (VYVANSE) 40 MG capsule Take 1 capsule (40 mg total) by mouth every morning. 30 capsule 0  . methocarbamol (ROBAXIN) 500 MG tablet Take 1 tablet (500 mg total) by mouth every 6 (six) hours as needed for muscle spasms. 120 tablet 5  .  Multiple Vitamin (MULTIVITAMIN) tablet Take 1 tablet by mouth daily.      . multivitamin-lutein (OCUVITE-LUTEIN) CAPS capsule Take 1 capsule by mouth daily.    . Probiotic Product (PROBIOTIC DAILY PO) Take by mouth.    . ranitidine (ZANTAC) 300 MG tablet Take 1 tablet (300 mg total) by mouth 2 (two) times daily. 180 tablet 3  . tamoxifen (NOLVADEX) 20 MG tablet Take 1 tablet (20 mg total) by mouth  daily. 90 tablet 3  . traZODone (DESYREL) 100 MG tablet TAKE 2 TABLETS EVERY NIGHT 60 tablet 8   No current facility-administered medications for this visit.    PHYSICAL EXAMINATION: ECOG PERFORMANCE STATUS: 1 - Symptomatic but completely ambulatory  Filed Vitals:   08/13/14 1513  BP: 103/67  Pulse: 86  Temp: 98.5 F (36.9 C)  Resp: 17   Filed Weights   08/13/14 1513  Weight: 134 lb 9.6 oz (61.054 kg)    GENERAL:alert, no distress and comfortable SKIN: skin color, texture, turgor are normal, no rashes or significant lesions EYES: normal, Conjunctiva are pink and non-injected, sclera clear OROPHARYNX:no exudate, no erythema and lips, buccal mucosa, and tongue normal  NECK: supple, thyroid normal size, non-tender, without nodularity LYMPH:  no palpable lymphadenopathy in the cervical, axillary or inguinal LUNGS: clear to auscultation and percussion with normal breathing effort HEART: regular rate & rhythm and no murmurs and no lower extremity edema ABDOMEN:abdomen soft, non-tender and normal bowel sounds Musculoskeletal:no cyanosis of digits and no clubbing  NEURO: alert & oriented x 3 with fluent speech, no focal motor/sensory deficits BREAST: No palpable masses or nodules in either right or left breasts. No palpable axillary supraclavicular or infraclavicular adenopathy +ve breast tenderness or nipple discharge. (exam performed in the presence of a chaperone)  LABORATORY DATA:  I have reviewed the data as listed   Chemistry      Component Value Date/Time   NA 139 02/21/2014 1111   NA 141 11/26/2011 1223   K 4.4 02/21/2014 1111   K 4.7 11/26/2011 1223   CL 107 11/26/2011 1223   CO2 27 02/21/2014 1111   CO2 28 11/26/2011 1223   BUN 16.4 02/21/2014 1111   BUN 14 11/26/2011 1223   CREATININE 1.0 02/21/2014 1111   CREATININE 1.0 11/26/2011 1223      Component Value Date/Time   CALCIUM 9.0 02/21/2014 1111   CALCIUM 9.1 11/26/2011 1223   ALKPHOS 33* 02/21/2014 1111    ALKPHOS 39 11/26/2011 1223   AST 21 02/21/2014 1111   AST 21 11/26/2011 1223   ALT 22 02/21/2014 1111   ALT 17 11/26/2011 1223   BILITOT 0.47 02/21/2014 1111   BILITOT 0.7 11/26/2011 1223       Lab Results  Component Value Date   WBC 4.8 02/21/2014   HGB 12.0 02/21/2014   HCT 36.4 02/21/2014   MCV 97.7 02/21/2014   PLT 177 02/21/2014   NEUTROABS 3.3 02/21/2014     ASSESSMENT & PLAN:  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 Superficial phlebitis 2. Tolerating it very well otherwise 3. Breast tenderness could be related to swimming or tamoxifen related: Recommended yoga  Breast cancer Surveillance: 1. Mammograms to be done feb 2016 2. Breast Exam: 08/13/14 Normal: Tenderness in the breast and chest wall on both sides including the axilla. It feels like musculoskeletal in  nature no palpable abnormalities  RTC in 6 months  No orders of the defined types were placed in this encounter.   The patient has a good understanding of the overall plan. she agrees with it. She will call with any problems that may develop before her next visit here.   Rulon Eisenmenger, MD

## 2014-08-13 NOTE — Telephone Encounter (Signed)
, °

## 2014-08-13 NOTE — Assessment & Plan Note (Addendum)
Right breast DCIS ER/PR positive: Patient is currently on antiestrogen therapy with tamoxifen and is tolerating it very well without any major problems  Tamoxifen toxicities 1. Superficial phlebitis 2. Tolerating it very well otherwise  Breast cancer Surveillance: 1. Mammograms to be done feb 2016 2. Breast Exam: 08/13/14 Normal  RTC in 6 months

## 2014-08-16 ENCOUNTER — Ambulatory Visit: Payer: Self-pay | Admitting: Podiatry

## 2014-08-21 ENCOUNTER — Telehealth: Payer: Self-pay | Admitting: Family Medicine

## 2014-08-21 MED ORDER — HYDROCODONE-ACETAMINOPHEN 10-325 MG PO TABS: 1.0000 | ORAL_TABLET | Freq: Four times a day (QID) | ORAL | Status: DC | PRN

## 2014-08-21 MED ORDER — LISDEXAMFETAMINE DIMESYLATE 40 MG PO CAPS: 40.0000 mg | ORAL_CAPSULE | ORAL | Status: DC

## 2014-08-21 NOTE — Telephone Encounter (Signed)
rx up front for p/u, pt aware

## 2014-08-21 NOTE — Telephone Encounter (Signed)
done

## 2014-08-21 NOTE — Telephone Encounter (Signed)
Pt needs new rxs  hydrocodone and vyvanse 40 mg

## 2014-08-23 ENCOUNTER — Other Ambulatory Visit: Payer: Self-pay | Admitting: Family Medicine

## 2014-08-24 ENCOUNTER — Other Ambulatory Visit: Payer: Self-pay | Admitting: Family Medicine

## 2014-08-28 ENCOUNTER — Telehealth: Payer: Self-pay | Admitting: *Deleted

## 2014-08-28 MED ORDER — ALPRAZOLAM 0.5 MG PO TABS: 0.5000 mg | ORAL_TABLET | Freq: Two times a day (BID) | ORAL | Status: DC | PRN

## 2014-08-28 NOTE — Telephone Encounter (Signed)
Script was faxed.

## 2014-08-28 NOTE — Telephone Encounter (Signed)
Refill xanax 0.5 mg 1 tab bid prn for anxiety  [facility]

## 2014-08-28 NOTE — Telephone Encounter (Signed)
Ready to fax to [facility]

## 2014-08-30 ENCOUNTER — Other Ambulatory Visit: Payer: Self-pay | Admitting: Family Medicine

## 2014-08-30 MED ORDER — ALPRAZOLAM 0.5 MG PO TABS: 0.5000 mg | ORAL_TABLET | Freq: Two times a day (BID) | ORAL | Status: DC | PRN

## 2014-08-30 NOTE — Telephone Encounter (Signed)
Pt states [place] is going to the beach on [place] and will not receive her rx ALPRAZolam (XANAX) 0.5 MG tablet until next week. Pt would [facility] to get her through to her rx arrives next week sometime

## 2014-08-30 NOTE — Telephone Encounter (Signed)
Call in #[phone] with no rf to her local pharmacy

## 2014-08-30 NOTE — Telephone Encounter (Signed)
[facility] Called and spoke with pt and pt is aware.

## 2014-09-26 ENCOUNTER — Telehealth: Payer: Self-pay | Admitting: Family Medicine

## 2014-09-26 NOTE — Telephone Encounter (Signed)
Pt request refill of the following: lisdexamfetamine (VYVANSE) 40 MG capsule,  Methocarbomal    Phamacy: [facility]

## 2014-09-28 ENCOUNTER — Encounter: Payer: Self-pay | Admitting: Obstetrics & Gynecology

## 2014-09-28 ENCOUNTER — Ambulatory Visit (INDEPENDENT_AMBULATORY_CARE_PROVIDER_SITE_OTHER): Payer: 59 | Admitting: Obstetrics & Gynecology

## 2014-09-28 VITALS — BP 116/78 | HR 64 | Resp 16 | Ht 66.0 in | Wt 133.2 lb

## 2014-09-28 DIAGNOSIS — Z23 Encounter for immunization: Secondary | ICD-10-CM | POA: Diagnosis not present

## 2014-09-28 DIAGNOSIS — Z01419 Encounter for gynecological examination (general) (routine) without abnormal findings: Secondary | ICD-10-CM | POA: Diagnosis not present

## 2014-09-28 DIAGNOSIS — M25562 Pain in left knee: Secondary | ICD-10-CM | POA: Diagnosis not present

## 2014-09-28 DIAGNOSIS — Z124 Encounter for screening for malignant neoplasm of cervix: Secondary | ICD-10-CM | POA: Diagnosis not present

## 2014-09-28 DIAGNOSIS — Z0000 Encounter for general adult medical examination without abnormal findings: Secondary | ICD-10-CM

## 2014-09-28 LAB — POCT URINALYSIS DIPSTICK
Bilirubin, UA: NEGATIVE
Blood, UA: NEGATIVE
Glucose, UA: NEGATIVE
KETONES UA: NEGATIVE
LEUKOCYTES UA: NEGATIVE
Nitrite, UA: NEGATIVE
PROTEIN UA: NEGATIVE
UROBILINOGEN UA: NEGATIVE
pH, UA: 5

## 2014-09-28 LAB — CBC
HCT: 37.3 % (ref 36.0–46.0)
HEMOGLOBIN: 12.7 g/dL (ref 12.0–15.0)
MCH: 32 pg (ref 26.0–34.0)
MCHC: 34 g/dL (ref 30.0–36.0)
MCV: 94 fL (ref 78.0–100.0)
MPV: 10.2 fL (ref 8.6–12.4)
PLATELETS: 245 10*3/uL (ref 150–400)
RBC: 3.97 MIL/uL (ref 3.87–5.11)
RDW: 12.4 % (ref 11.5–15.5)
WBC: 11.6 10*3/uL — ABNORMAL HIGH (ref 4.0–10.5)

## 2014-09-28 LAB — COMPREHENSIVE METABOLIC PANEL
ALBUMIN: 4 g/dL (ref 3.5–5.2)
ALT: 12 U/L (ref 0–35)
AST: 17 U/L (ref 0–37)
Alkaline Phosphatase: 38 U/L — ABNORMAL LOW (ref 39–117)
BUN: 10 mg/dL (ref 6–23)
CO2: 27 mEq/L (ref 19–32)
CREATININE: 0.88 mg/dL (ref 0.50–1.10)
Calcium: 9.1 mg/dL (ref 8.4–10.5)
Chloride: 101 mEq/L (ref 96–112)
Glucose, Bld: 88 mg/dL (ref 70–99)
Potassium: 4.5 mEq/L (ref 3.5–5.3)
Sodium: 136 mEq/L (ref 135–145)
Total Bilirubin: 0.6 mg/dL (ref 0.2–1.2)
Total Protein: 6.7 g/dL (ref 6.0–8.3)

## 2014-09-28 LAB — TSH: TSH: 0.343 u[IU]/mL — ABNORMAL LOW (ref 0.350–4.500)

## 2014-09-28 LAB — LIPID PANEL
CHOL/HDL RATIO: 1.6 ratio
CHOLESTEROL: 155 mg/dL (ref 0–200)
HDL: 97 mg/dL (ref >=46)
LDL Cholesterol: 51 mg/dL (ref 0–99)
TRIGLYCERIDES: 35 mg/dL (ref <150)
VLDL: 7 mg/dL (ref 0–40)

## 2014-09-28 MED ORDER — LISDEXAMFETAMINE DIMESYLATE 40 MG PO CAPS: 40.0000 mg | ORAL_CAPSULE | ORAL | Status: DC

## 2014-09-28 NOTE — Telephone Encounter (Signed)
Done for 3 months

## 2014-09-28 NOTE — Progress Notes (Signed)
59 y.o. G2P1 MarriedCaucasianF here for annual exam.  Doing well.  Vaginal dryness is better with Replens.  No vaginal bleeding.  She is having a little discharge.  She still has breast tenderness but she feels it does come and go.  Celebrex does help.  Having a lot of left knee issues.  Hears cracking in left knee with extension.  Overall, feels having more joint pain since her breast cancer treatment.  Wonders if this is just arthritis or more rheumatologic related.  Left knee bothers her the most.  Weather changes do make her have more pain.   Seeing Dr. Lindi Adie ever six months.   Patient's last menstrual period was 09/21/2006.          Sexually active: Yes.    The current method of family planning is post menopausal status.    Exercising: Yes.    swimming Smoker:  Smoked for a short period of time in college  Health Maintenance: Pap:  09/26/13 WNL History of abnormal Pap:  no MMG:  08/08/14  diag MMG/US-diag in one year Colonoscopy:  2007-repeat in 10 years BMD:   2/15-osteopenia TDaP:  2006-will get with PCP Screening Labs: today, Hb today: today, Urine today: negative    reports that she quit smoking about 36 years ago. She has never used smokeless tobacco. She reports that she drinks about 0.6 - 1.2 oz of alcohol per week. She reports that she does not use illicit drugs.  Past Medical History  Diagnosis Date  . History of depression   . Allergy   . Hypothyroid   . Sialoadenitis   . GERD (gastroesophageal reflux disease)   . Headache(784.0)   . Arthritis     osteoarthritrs  . ADHD (attention deficit hyperactivity disorder)     ADD  . PONV (postoperative nausea and vomiting)   . Wears glasses   . Breast cancer 08/15/13    right lateral upper outer  . Hx of radiation therapy 10/19/13- 11/09/13    right breast 4256 cGy in 16 sessions, hypo-fractionated  . Fibromyalgia   . Migraine   . Urinary incontinence   . Spinal stenosis   . Vitiligo     Past Surgical History   Procedure Laterality Date  . Diagnostic laparoscopy  1982    exp  . Cholecystectomy  1996    lap  . Breast lumpectomy with radioactive seed localization Right 08/30/2013    Procedure: RIGHT PARTIAL MASTECTOMY WITH RADIOACTIVE SEED LOCALIZATION;  Surgeon: [name], MD;  Location: [facility];  Service: General;  Laterality: Right;  . Colonoscopy  1998    per Dr. Earlean Shawl, clear, repeat in 10 yrs  . Laparoscopic ovarian cystectomy    . Urethral dilation      Current Outpatient Prescriptions  Medication Sig Dispense Refill  . ALPRAZolam (XANAX) 0.5 MG tablet Take 1 tablet (0.5 mg total) by mouth 2 (two) times daily as needed for anxiety. 60 tablet 0  . aspirin EC 81 MG tablet Take 1 tablet (81 mg total) by mouth daily. 1 tablet 0  . Calcium Carbonate-Vitamin D (CALCIUM 600+D) 600-400 MG-UNIT per tablet Take 1 tablet by mouth 2 (two) times daily. And Vitamin K    . celecoxib (CELEBREX) 200 MG capsule Take 1 capsule (200 mg total) by mouth 2 (two) times daily. 180 capsule 3  . cetirizine (ZYRTEC) 10 MG tablet Take 10 mg by mouth as needed for allergies.    Marland Kitchen CRANBERRY EXTRACT PO Take 1 tablet  by mouth daily as needed.     . cycloSPORINE (RESTASIS) 0.05 % ophthalmic emulsion Place 1 drop into both eyes 2 (two) times daily.    Marland Kitchen HYDROcodone-acetaminophen (NORCO) 10-325 MG per tablet Take 1 tablet by mouth every 6 (six) hours as needed for severe pain. 120 tablet 0  . levothyroxine (SYNTHROID, LEVOTHROID) 112 MCG tablet TAKE 1 TABLET DAILY (CALL OFFICE FOR APPOINTMENT AND LABWORK) 90 tablet 0  . lisdexamfetamine (VYVANSE) 40 MG capsule Take 1 capsule (40 mg total) by mouth every morning. 30 capsule 0  . methocarbamol (ROBAXIN) 500 MG tablet Take 1 tablet (500 mg total) by mouth every 6 (six) hours as needed for muscle spasms. 120 tablet 5  . Multiple Vitamin (MULTIVITAMIN) tablet Take 1 tablet by mouth daily.      . multivitamin-lutein (OCUVITE-LUTEIN) CAPS capsule Take 1  capsule by mouth daily.    . Omega-3 Fatty Acids (FISH OIL PO) Take by mouth daily.    . Probiotic Product (PROBIOTIC DAILY PO) Take by mouth.    . ranitidine (ZANTAC) 300 MG tablet Take 1 tablet (300 mg total) by mouth 2 (two) times daily. 180 tablet 3  . tamoxifen (NOLVADEX) 20 MG tablet Take 1 tablet (20 mg total) by mouth daily. 90 tablet 3  . traZODone (DESYREL) 100 MG tablet TAKE 2 TABLETS EVERY NIGHT 60 tablet 8  . Vaginal Lubricant (REPLENS [facility]) Place vaginally.    Marland Kitchen KRILL OIL PO Take by mouth daily.     No current facility-administered medications for this visit.    Family History  Problem Relation Age of Onset  . Arthritis Mother   . Diabetes Mother   . Heart attack Father   . Colon cancer Maternal Uncle     dx in his 84s  . Colon cancer Cousin 77    mother's maternal first cousin  . Breast cancer Cousin 77    BRCA negative; father's maternal first cousin  . Leukemia Maternal Uncle 81    AML  . Melanoma Maternal Uncle 73  . Leukemia Maternal Uncle 38  . Ovarian cancer Cousin 56    maternal cousin  . Ovarian cancer Cousin 24    maternal cousin  . Leukemia Cousin     dx in his 39s; maternal cousin; thought to be the result of taking [place]  . Heart attack Paternal Uncle   . Colon cancer Cousin 52    maternal second cousin  . Breast cancer Other     maternal great aunt dx in her 58s  . Ovarian cancer Other     maternal grandfather's sister dx in her 46s  . Colon cancer Other     maternal grandfather's sister dx in her 65s  . Colon cancer Other     maternal grandfather's sister dx in her 28s  . Other Daughter     dermoid tumor  . Diabetes Paternal Grandmother   . Skin cancer Paternal Grandmother   . Hypertension Paternal Grandmother   . Osteoporosis Mother   . Polymyalgia rheumatica Mother     on prednisone    ROS:  Pertinent items are noted in HPI.  Otherwise, a comprehensive ROS was negative.  Exam:   BP 116/78 mmHg  Pulse 64  Resp 16  Ht 5' 6"   (1.676 m)  Wt 133 lb 3.2 oz (60.419 kg)  BMI 21.51 kg/m2  LMP 09/21/2006  Weight change: @WEIGHTCHANGE @ Height:   Height: 5' 6"  (167.6 cm)  Ht Readings from Last 3 Encounters:  09/28/14 5' 6"  (1.676 m)  08/13/14 5' 6"  (1.676 m)  06/20/14 5' 6"  (1.676 m)    General appearance: alert, cooperative and appears stated age Head: Normocephalic, without obvious abnormality, atraumatic Neck: no adenopathy, supple, symmetrical, trachea midline and thyroid normal to inspection and palpation Lungs: clear to auscultation bilaterally Breasts: normal appearance, no masses or tenderness, (on left), right breast with well healed scar and radiation changes Heart: regular rate and rhythm Abdomen: soft, non-tender; bowel sounds normal; no masses,  no organomegaly Extremities: extremities normal, atraumatic, no cyanosis or edema Skin: Skin color, texture, turgor normal. No rashes or lesions Lymph nodes: Cervical, supraclavicular, and axillary nodes normal. No abnormal inguinal nodes palpated Neurologic: Grossly normal   Pelvic: External genitalia:  no lesions              Urethra:  normal appearing urethra with no masses, tenderness or lesions              Bartholins and Skenes: normal                 Vagina: normal appearing vagina with normal color and discharge, no lesions              Cervix: no lesions              Pap taken: Yes.   Bimanual Exam:  Uterus:  normal size, contour, position, consistency, mobility, non-tender              Adnexa: normal adnexa and no mass, fullness, tenderness               Rectovaginal: Confirms               Anus:  normal sphincter tone, no lesions  Chaperone was present for exam.  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  Pt requests yearly. CMP, CBC, TSH, Vit D, Lipids today ESR Referral to Dr. Lynann Bologna for assessment of left knee pain Tdap today return annually or prn

## 2014-09-28 NOTE — Telephone Encounter (Signed)
Script is ready for pick up and tried to reach pt, no answer.

## 2014-09-29 LAB — SEDIMENTATION RATE: Sed Rate: 7 mm/hr (ref 0–30)

## 2014-09-29 LAB — VITAMIN D 25 HYDROXY (VIT D DEFICIENCY, FRACTURES): Vit D, 25-Hydroxy: 49 ng/mL (ref 30–100)

## 2014-09-30 ENCOUNTER — Emergency Department (HOSPITAL_COMMUNITY): Payer: 59

## 2014-09-30 ENCOUNTER — Encounter (HOSPITAL_COMMUNITY): Payer: Self-pay

## 2014-09-30 ENCOUNTER — Emergency Department (HOSPITAL_COMMUNITY)
Admission: EM | Admit: 2014-09-30 | Discharge: 2014-09-30 | Disposition: A | Discharge status: Home / Self Care | Payer: 59 | Attending: Emergency Medicine | Admitting: Emergency Medicine

## 2014-09-30 DIAGNOSIS — C50411 Malignant neoplasm of upper-outer quadrant of right female breast: Secondary | ICD-10-CM | POA: Insufficient documentation

## 2014-09-30 DIAGNOSIS — J159 Unspecified bacterial pneumonia: Secondary | ICD-10-CM | POA: Diagnosis not present

## 2014-09-30 DIAGNOSIS — Z791 Long term (current) use of non-steroidal anti-inflammatories (NSAID): Secondary | ICD-10-CM | POA: Diagnosis not present

## 2014-09-30 DIAGNOSIS — Z87891 Personal history of nicotine dependence: Secondary | ICD-10-CM | POA: Diagnosis not present

## 2014-09-30 DIAGNOSIS — R079 Chest pain, unspecified: Secondary | ICD-10-CM | POA: Diagnosis present

## 2014-09-30 DIAGNOSIS — Z872 Personal history of diseases of the skin and subcutaneous tissue: Secondary | ICD-10-CM | POA: Diagnosis not present

## 2014-09-30 DIAGNOSIS — K219 Gastro-esophageal reflux disease without esophagitis: Secondary | ICD-10-CM | POA: Diagnosis not present

## 2014-09-30 DIAGNOSIS — M1990 Unspecified osteoarthritis, unspecified site: Secondary | ICD-10-CM | POA: Insufficient documentation

## 2014-09-30 DIAGNOSIS — Z79899 Other long term (current) drug therapy: Secondary | ICD-10-CM | POA: Diagnosis not present

## 2014-09-30 DIAGNOSIS — E039 Hypothyroidism, unspecified: Secondary | ICD-10-CM | POA: Insufficient documentation

## 2014-09-30 DIAGNOSIS — Z8679 Personal history of other diseases of the circulatory system: Secondary | ICD-10-CM | POA: Diagnosis not present

## 2014-09-30 DIAGNOSIS — J189 Pneumonia, unspecified organism: Secondary | ICD-10-CM

## 2014-09-30 DIAGNOSIS — Z7982 Long term (current) use of aspirin: Secondary | ICD-10-CM | POA: Insufficient documentation

## 2014-09-30 DIAGNOSIS — R109 Unspecified abdominal pain: Secondary | ICD-10-CM | POA: Insufficient documentation

## 2014-09-30 DIAGNOSIS — F329 Major depressive disorder, single episode, unspecified: Secondary | ICD-10-CM | POA: Diagnosis not present

## 2014-09-30 DIAGNOSIS — F909 Attention-deficit hyperactivity disorder, unspecified type: Secondary | ICD-10-CM | POA: Diagnosis not present

## 2014-09-30 LAB — CBC WITH DIFFERENTIAL/PLATELET
Basophils Absolute: 0 10*3/uL (ref 0.0–0.1)
Basophils Relative: 0 % (ref 0–1)
Eosinophils Absolute: 0.2 10*3/uL (ref 0.0–0.7)
Eosinophils Relative: 3 % (ref 0–5)
HCT: 34.9 % — ABNORMAL LOW (ref 36.0–46.0)
HEMOGLOBIN: 11.6 g/dL — AB (ref 12.0–15.0)
Lymphocytes Relative: 15 % (ref 12–46)
Lymphs Abs: 1 10*3/uL (ref 0.7–4.0)
MCH: 31.6 pg (ref 26.0–34.0)
MCHC: 33.2 g/dL (ref 30.0–36.0)
MCV: 95.1 fL (ref 78.0–100.0)
MONO ABS: 0.6 10*3/uL (ref 0.1–1.0)
MONOS PCT: 8 % (ref 3–12)
NEUTROS ABS: 4.8 10*3/uL (ref 1.7–7.7)
NEUTROS PCT: 74 % (ref 43–77)
PLATELETS: 205 10*3/uL (ref 150–400)
RBC: 3.67 MIL/uL — AB (ref 3.87–5.11)
RDW: 11.8 % (ref 11.5–15.5)
WBC: 6.6 10*3/uL (ref 4.0–10.5)

## 2014-09-30 LAB — COMPREHENSIVE METABOLIC PANEL
ALK PHOS: 35 U/L — AB (ref 39–117)
ALT: 14 U/L (ref 0–35)
ANION GAP: 8 (ref 5–15)
AST: 18 U/L (ref 0–37)
Albumin: 3.2 g/dL — ABNORMAL LOW (ref 3.5–5.2)
BILIRUBIN TOTAL: 0.7 mg/dL (ref 0.3–1.2)
BUN: 7 mg/dL (ref 6–23)
CO2: 28 mmol/L (ref 19–32)
CREATININE: 0.95 mg/dL (ref 0.50–1.10)
Calcium: 8.9 mg/dL (ref 8.4–10.5)
Chloride: 101 mmol/L (ref 96–112)
GFR calc non Af Amer: 65 mL/min — ABNORMAL LOW (ref >90)
GFR, EST AFRICAN AMERICAN: 75 mL/min — AB (ref >90)
Glucose, Bld: 96 mg/dL (ref 70–99)
Potassium: 3.9 mmol/L (ref 3.5–5.1)
Sodium: 137 mmol/L (ref 135–145)
Total Protein: 6.4 g/dL (ref 6.0–8.3)

## 2014-09-30 LAB — I-STAT TROPONIN, ED: TROPONIN I, POC: 0 ng/mL (ref 0.00–0.08)

## 2014-09-30 LAB — LIPASE, BLOOD: LIPASE: 21 U/L (ref 11–59)

## 2014-09-30 MED ORDER — MORPHINE SULFATE 4 MG/ML IJ SOLN
4.0000 mg | Freq: Once | INTRAMUSCULAR | Status: AC
  Administered 2014-09-30: 4 mg via INTRAVENOUS
  Filled 2014-09-30: qty 1

## 2014-09-30 MED ORDER — SODIUM CHLORIDE 0.9 % IV BOLUS (SEPSIS)
1000.0000 mL | Freq: Once | INTRAVENOUS | Status: AC
  Administered 2014-09-30: 1000 mL via INTRAVENOUS

## 2014-09-30 MED ORDER — FENTANYL CITRATE 0.05 MG/ML IJ SOLN
50.0000 ug | Freq: Once | INTRAMUSCULAR | Status: AC
Start: 2014-09-30 — End: 2014-09-30
  Administered 2014-09-30: 50 ug via INTRAVENOUS
  Filled 2014-09-30: qty 2

## 2014-09-30 MED ORDER — LEVOFLOXACIN 750 MG PO TABS
750.0000 mg | ORAL_TABLET | Freq: Once | ORAL | Status: AC
  Administered 2014-09-30: 750 mg via ORAL
  Filled 2014-09-30: qty 1

## 2014-09-30 MED ORDER — LEVOFLOXACIN 750 MG PO TABS: 750.0000 mg | ORAL_TABLET | Freq: Every day | ORAL | Status: DC

## 2014-09-30 MED ORDER — ONDANSETRON HCL 4 MG/2ML IJ SOLN
4.0000 mg | Freq: Once | INTRAMUSCULAR | Status: AC
  Administered 2014-09-30: 4 mg via INTRAVENOUS
  Filled 2014-09-30: qty 2

## 2014-09-30 MED ORDER — IOHEXOL 350 MG/ML SOLN
100.0000 mL | Freq: Once | INTRAVENOUS | Status: AC | PRN
  Administered 2014-09-30: 100 mL via INTRAVENOUS

## 2014-09-30 MED ORDER — FENTANYL CITRATE 0.05 MG/ML IJ SOLN
50.0000 ug | Freq: Once | INTRAMUSCULAR | Status: AC
  Administered 2014-09-30: 50 ug via INTRAVENOUS
  Filled 2014-09-30: qty 2

## 2014-09-30 MED ORDER — HYDROCODONE-HOMATROPINE 5-1.5 MG/5ML PO SYRP: 5.0000 mL | ORAL_SOLUTION | Freq: Four times a day (QID) | ORAL | Status: DC | PRN

## 2014-09-30 MED ORDER — LEVOFLOXACIN 500 MG PO TABS: 500.0000 mg | ORAL_TABLET | Freq: Once | ORAL | Status: DC

## 2014-09-30 MED ORDER — SODIUM CHLORIDE 0.9 % IV BOLUS (SEPSIS): 1000.0000 mL | Freq: Once | INTRAVENOUS | Status: DC

## 2014-09-30 NOTE — ED Notes (Signed)
PA aware of pt BP, orders to hang second bag of fluid and fentanyl for future pain.

## 2014-09-30 NOTE — ED Provider Notes (Signed)
Pt seen and evaluated.  History of right CP for 2-3 days.  Has developed cough only today. Chills, but no documented fever.  Pneumonia by Ct.  Stable hemodynamics.  SBP 97 "normal for me". Ambulatory without orthostasis or hypoxemia. She is appropriate for outpatient treatment. Given her Levaquin here. All questions answered.  Tanna Furry, MD 09/30/14 1626

## 2014-09-30 NOTE — ED Provider Notes (Signed)
CSN: [id number]     Arrival date & time 09/30/14  1148 History   First MD Initiated Contact with Patient 09/30/14 1215     Chief Complaint  Patient presents with  . Abdominal Pain     (Consider location/radiation/quality/duration/timing/severity/associated sxs/prior Treatment) HPI Comments: Patient is a 59 yo F PMHx significant for h/o breast cancer s/p radiation therapy on tamoxifen presenting to the ED for right sided chest pain that awoke her from sleep this morning at 5AM. She has tried taking her at home percocet for breakthrough pain with improvement, but the pain keeps returning. Patient describes it as pleuritic in nature, worse with deep inspiration and movement. Denies any cough, fever, nausea, vomiting, diarrhea, abdominal pain, SOB, lower extremity edema. Denies any history of similar chest pain. No modifying factors identified.  Patient is a 59 y.o. female presenting with abdominal pain.  Abdominal Pain Associated symptoms: chest pain     Past Medical History  Diagnosis Date  . History of depression   . Allergy   . Hypothyroid   . Sialoadenitis   . GERD (gastroesophageal reflux disease)   . Headache(784.0)   . Arthritis     osteoarthritrs  . ADHD (attention deficit hyperactivity disorder)     ADD  . PONV (postoperative nausea and vomiting)   . Wears glasses   . Breast cancer 08/15/13    right lateral upper outer  . Hx of radiation therapy 10/19/13- 11/09/13    right breast 4256 cGy in 16 sessions, hypo-fractionated  . Fibromyalgia   . Migraine   . Urinary incontinence   . Spinal stenosis   . Vitiligo    Past Surgical History  Procedure Laterality Date  . Diagnostic laparoscopy  1982    exp  . Cholecystectomy  1996    lap  . Breast lumpectomy with radioactive seed localization Right 08/30/2013    Procedure: RIGHT PARTIAL MASTECTOMY WITH RADIOACTIVE SEED LOCALIZATION;  Surgeon: [name], MD;  Location: [facility];  Service: General;   Laterality: Right;  . Colonoscopy  1998    per Dr. Earlean Shawl, clear, repeat in 10 yrs  . Laparoscopic ovarian cystectomy    . Urethral dilation     Family History  Problem Relation Age of Onset  . Arthritis Mother   . Diabetes Mother   . Heart attack Father   . Colon cancer Maternal Uncle     dx in his 90s  . Colon cancer Cousin 15    mother's maternal first cousin  . Breast cancer Cousin 20    BRCA negative; father's maternal first cousin  . Leukemia Maternal Uncle 81    AML  . Melanoma Maternal Uncle 73  . Leukemia Maternal Uncle 92  . Ovarian cancer Cousin 16    maternal cousin  . Ovarian cancer Cousin 24    maternal cousin  . Leukemia Cousin     dx in his 30s; maternal cousin; thought to be the result of taking [place]  . Heart attack Paternal Uncle   . Colon cancer Cousin 78    maternal second cousin  . Breast cancer Other     maternal great aunt dx in her 60s  . Ovarian cancer Other     maternal grandfather's sister dx in her 71s  . Colon cancer Other     maternal grandfather's sister dx in her 86s  . Colon cancer Other     maternal grandfather's sister dx in her 40s  . Other Daughter  dermoid tumor  . Diabetes Paternal Grandmother   . Skin cancer Paternal Grandmother   . Hypertension Paternal Grandmother   . Osteoporosis Mother   . Polymyalgia rheumatica Mother     on prednisone   History  Substance Use Topics  . Smoking status: Former Smoker    Quit date: 09/14/1978  . Smokeless tobacco: Never Used     Comment: short time in college  . Alcohol Use: 0.6 - 1.2 oz/week    1-2 Standard drinks or equivalent per week   OB History    Gravida Para Term Preterm AB TAB SAB Ectopic Multiple Living   2 1        1      Review of Systems  Cardiovascular: Positive for chest pain.  Gastrointestinal: Positive for abdominal pain.  All other systems reviewed and are negative.     Allergies  Review of patient's allergies indicates no known allergies.  Home  Medications   Prior to Admission medications   Medication Sig Start Date End Date Taking? Authorizing Provider  ALPRAZolam Duanne Moron) 0.5 MG tablet Take 1 tablet (0.5 mg total) by mouth 2 (two) times daily as needed for anxiety. 08/30/14   Laurey Morale, MD  aspirin EC 81 MG tablet Take 1 tablet (81 mg total) by mouth daily. 03/26/14   Laurey Morale, MD  Calcium Carbonate-Vitamin D (CALCIUM 600+D) 600-400 MG-UNIT per tablet Take 1 tablet by mouth 2 (two) times daily. And Vitamin K 08/23/13   Historical Provider, MD  celecoxib (CELEBREX) 200 MG capsule Take 1 capsule (200 mg total) by mouth 2 (two) times daily. 05/23/14   Laurey Morale, MD  cetirizine (ZYRTEC) 10 MG tablet Take 10 mg by mouth as needed for allergies.    Historical Provider, MD  CRANBERRY EXTRACT PO Take 1 tablet by mouth daily as needed.  08/23/13   Historical Provider, MD  cycloSPORINE (RESTASIS) 0.05 % ophthalmic emulsion Place 1 drop into both eyes 2 (two) times daily.    Historical Provider, MD  HYDROcodone-acetaminophen (NORCO) 10-325 MG per tablet Take 1 tablet by mouth every 6 (six) hours as needed for severe pain. 08/21/14   Laurey Morale, MD  HYDROcodone-homatropine [facility]) 5-1.5 MG/5ML syrup Take 5 mLs by mouth every 6 (six) hours as needed. 09/30/14   Estell Dillinger, PA-C  KRILL OIL PO Take by mouth daily.    Historical Provider, MD  levofloxacin (LEVAQUIN) 750 MG tablet Take 1 tablet (750 mg total) by mouth daily. 09/30/14   Konstance Happel, PA-C  levothyroxine (SYNTHROID, LEVOTHROID) 112 MCG tablet TAKE 1 TABLET DAILY (CALL OFFICE FOR APPOINTMENT AND LABWORK) 08/24/14   Laurey Morale, MD  lisdexamfetamine (VYVANSE) 40 MG capsule Take 1 capsule (40 mg total) by mouth every morning. 09/28/14   Laurey Morale, MD  methocarbamol (ROBAXIN) 500 MG tablet Take 1 tablet (500 mg total) by mouth every 6 (six) hours as needed for muscle spasms. 11/23/13   Laurey Morale, MD  Multiple Vitamin (MULTIVITAMIN) tablet Take 1 tablet by mouth  daily.      Historical Provider, MD  multivitamin-lutein (OCUVITE-LUTEIN) CAPS capsule Take 1 capsule by mouth daily.    Historical Provider, MD  Omega-3 Fatty Acids (FISH OIL PO) Take by mouth daily.    Historical Provider, MD  Probiotic Product (PROBIOTIC DAILY PO) Take by mouth.    Historical Provider, MD  ranitidine (ZANTAC) 300 MG tablet Take 1 tablet (300 mg total) by mouth 2 (two) times daily. 01/03/14  Laurey Morale, MD  tamoxifen (NOLVADEX) 20 MG tablet Take 1 tablet (20 mg total) by mouth daily. 06/05/14   Nicholas Lose, MD  traZODone (DESYREL) 100 MG tablet TAKE 2 TABLETS EVERY NIGHT 07/15/13   Laurey Morale, MD  Vaginal Lubricant (REPLENS [facility]) Place vaginally.    Historical Provider, MD   BP 95/59 mmHg  Pulse 56  Temp(Src) 97.8 F (36.6 C) (Oral)  Resp 11  Ht 5' 6"  (1.676 m)  Wt 133 lb (60.328 kg)  BMI 21.48 kg/m2  SpO2 99%  LMP 09/21/2006 Physical Exam  Constitutional: She is oriented to person, place, and time. She appears well-developed and well-nourished. No distress.  HENT:  Head: Normocephalic and atraumatic.  Right Ear: External ear normal.  Left Ear: External ear normal.  Nose: Nose normal.  Mouth/Throat: Oropharynx is clear and moist. No oropharyngeal exudate.  Eyes: Conjunctivae are normal.  Neck: Normal range of motion. Neck supple.  No nuchal rigidity.   Cardiovascular: Regular rhythm and normal heart sounds.  Bradycardia present.   Pulmonary/Chest: Effort normal and breath sounds normal. She exhibits no tenderness.  Abdominal: Soft. There is no tenderness.  Musculoskeletal: Normal range of motion. She exhibits no edema.  Neurological: She is alert and oriented to person, place, and time.  Skin: Skin is warm and dry. She is not diaphoretic.  Psychiatric: She has a normal mood and affect.  Nursing note and vitals reviewed.   ED Course  Procedures (including critical care time) Medications  levofloxacin (LEVAQUIN) tablet 750 mg (not administered)   morphine 4 MG/ML injection 4 mg (4 mg Intravenous Given 09/30/14 1302)  ondansetron (ZOFRAN) injection 4 mg (4 mg Intravenous Given 09/30/14 1302)  sodium chloride 0.9 % bolus 1,000 mL (0 mLs Intravenous Stopped 09/30/14 1508)  sodium chloride 0.9 % bolus 1,000 mL (0 mLs Intravenous Stopped 09/30/14 1508)  fentaNYL (SUBLIMAZE) injection 50 mcg (50 mcg Intravenous Given 09/30/14 1447)  iohexol (OMNIPAQUE) 350 MG/ML injection 100 mL (100 mLs Intravenous Contrast Given 09/30/14 1524)  fentaNYL (SUBLIMAZE) injection 50 mcg (50 mcg Intravenous Given 09/30/14 1613)    Labs Review Labs Reviewed  CBC WITH DIFFERENTIAL/PLATELET - Abnormal; Notable for the following:    RBC 3.67 (*)    Hemoglobin 11.6 (*)    HCT 34.9 (*)    All other components within normal limits  COMPREHENSIVE METABOLIC PANEL - Abnormal; Notable for the following:    Albumin 3.2 (*)    Alkaline Phosphatase 35 (*)    GFR calc non Af Amer 65 (*)    GFR calc Af Amer 75 (*)    All other components within normal limits  LIPASE, BLOOD  I-STAT TROPOININ, ED    Imaging Review Ct Angio Chest Pe W/cm &/or Wo Cm  09/30/2014   CLINICAL DATA:  Woke up this morning with rib pain under right breast radiating into neck. History of right breast cancer diagnosed/year. Completed radiation treatment May 2015.  EXAM: CT ANGIOGRAPHY CHEST WITH CONTRAST  TECHNIQUE: Multidetector CT imaging of the chest was performed using the standard protocol during bolus administration of intravenous contrast. Multiplanar CT image reconstructions and MIPs were obtained to evaluate the vascular anatomy.  CONTRAST:  154m OMNIPAQUE IOHEXOL 350 MG/ML SOLN  COMPARISON:  None.  FINDINGS: Lungs are adequately inflated and demonstrate consolidation over the right middle lobe and minimal patchy airspace opacification over the lingula. No evidence of effusion. Airways are within normal.  The heart is normal size. No evidence of pulmonary emboli. Remaining mediastinal structures  are within normal. Postsurgical changes of the right breast.  Images through the upper abdomen demonstrate evidence of previous cholecystectomy. The remaining bones and soft tissues are within normal.  Review of the MIP images confirms the above findings.  IMPRESSION: No evidence of pulmonary embolism.  Consolidation over the right middle lobe and left upper lobe likely a pneumonia. Recommend follow-up to resolution.   Electronically Signed   By: Marin Olp M.D.   On: 09/30/2014 16:07     EKG Interpretation   Date/Time:  Sunday September 30 2014 11:56:29 EDT Ventricular Rate:  74 PR Interval:  126 QRS Duration: 84 QT Interval:  386 QTC Calculation: 428 R Axis:   73 Text Interpretation:  Normal sinus rhythm Nonspecific T wave abnormality  Abnormal ECG Confirmed by Jeneen Rinks  MD, [place] ([id number]) on 09/30/2014 3:47:23 PM      MDM   Final diagnoses:  CAP (community acquired pneumonia)   Filed Vitals:   09/30/14 1600  BP: 95/59  Pulse: 56  Temp:   Resp: 11   Afebrile, NAD, non-toxic appearing, AAOx4.   I have reviewed nursing notes, vital signs, and all appropriate lab and imaging results for this patient.  Given history of breast cancer on tamoxifen with non-reproducible pleuritic chest pain will obtain Ct angio of chest.   Patient with hypotension, states history of chronic hypotension without symptoms, no lightheadedness or dizziness.   Labs reviewed. No tachycardia. No leukocytosis. Chest pain does not appear to be cardiac in nature. CT angio reviewed without evidence of PE, right sided PNA noted, likely cause of pain. Will place on antibiotic for CAP with advised PCP f/u. Return precautions discussed. Patient is agreeable to plan. Patient is stable at time of discharge  Patient d/w with Dr. Jeneen Rinks, agrees with plan.       Baron Sane, PA-C 09/30/14 [place], MD 10/03/14 [phone]

## 2014-09-30 NOTE — ED Notes (Signed)
Pt woke up with RUQ abd pain this morning and hurts to take a deep breath. Took some hydrocodone and it helped ease it off.

## 2014-09-30 NOTE — Discharge Instructions (Signed)
Please follow up with your primary care physician in 1-2 days. If you do not have one please call the [place] number listed above. Please take your antibiotic until completion. Please take pain medication and/or muscle relaxants as prescribed and as needed for pain. Please do not drive on narcotic pain medication or on muscle relaxants. Please read all discharge instructions and return precautions.   Pneumonia Pneumonia is an infection of the lungs.  CAUSES Pneumonia may be caused by bacteria or a virus. Usually, these infections are caused by breathing infectious particles into the lungs (respiratory tract). SIGNS AND SYMPTOMS   Cough.  Fever.  Chest pain.  Increased rate of breathing.  Wheezing.  Mucus production. DIAGNOSIS  If you have the common symptoms of pneumonia, your health care provider will typically confirm the diagnosis with a chest X-ray. The X-ray will show an abnormality in the lung (pulmonary infiltrate) if you have pneumonia. Other tests of your blood, urine, or sputum may be done to find the specific cause of your pneumonia. Your health care provider may also do tests (blood gases or pulse oximetry) to see how well your lungs are working. TREATMENT  Some forms of pneumonia may be spread to other people when you cough or sneeze. You may be asked to wear a mask before and during your exam. Pneumonia that is caused by bacteria is treated with antibiotic medicine. Pneumonia that is caused by the influenza virus may be treated with an antiviral medicine. Most other viral infections must run their course. These infections will not respond to antibiotics.  HOME CARE INSTRUCTIONS   Cough suppressants may be used if you are losing too much rest. However, coughing protects you by clearing your lungs. You should avoid using cough suppressants if you can.  Your health care provider may have prescribed medicine if he or she thinks your pneumonia is caused by  bacteria or influenza. Finish your medicine even if you start to feel better.  Your health care provider may also prescribe an expectorant. This loosens the mucus to be coughed up.  Take medicines only as directed by your health care provider.  Do not smoke. Smoking is a common cause of bronchitis and can contribute to pneumonia. If you are a smoker and continue to smoke, your cough may last several weeks after your pneumonia has cleared.  A cold steam vaporizer or humidifier in your room or home may help loosen mucus.  Coughing is often worse at night. Sleeping in a semi-upright position in a recliner or using a couple pillows under your head will help with this.  Get rest as you feel it is needed. Your body will usually let you know when you need to rest. PREVENTION A pneumococcal shot (vaccine) is available to prevent a common bacterial cause of pneumonia. This is usually suggested for:  People over 62 years old.  Patients on chemotherapy.  People with chronic lung problems, such as bronchitis or emphysema.  People with immune system problems. If you are over 65 or have a high risk condition, you may receive the pneumococcal vaccine if you have not received it before. In some countries, a routine influenza vaccine is also recommended. This vaccine can help prevent some cases of pneumonia.You may be offered the influenza vaccine as part of your care. If you smoke, it is time to quit. You may receive instructions on how to stop smoking. Your health care provider can provide medicines and counseling to help you  quit. SEEK MEDICAL CARE IF: You have a fever. SEEK IMMEDIATE MEDICAL CARE IF:   Your illness becomes worse. This is especially true if you are elderly or weakened from any other disease.  You cannot control your cough with suppressants and are losing sleep.  You begin coughing up blood.  You develop pain which is getting worse or is uncontrolled with medicines.  Any of  the symptoms which initially brought you in for treatment are getting worse rather than better.  You develop shortness of breath or chest pain. MAKE SURE YOU:   Understand these instructions.  Will watch your condition.  Will get help right away if you are not doing well or get worse. Document Released: 06/08/2005 Document Revised: 10/23/2013 Document Reviewed: 08/28/2010 [facility] Patient Information 2015 [place], [place]. This information is not intended to replace advice given to you by your health care provider. Make sure you discuss any questions you have with your health care provider.

## 2014-10-01 ENCOUNTER — Telehealth: Payer: Self-pay

## 2014-10-01 LAB — IPS PAP TEST WITH REFLEX TO HPV

## 2014-10-01 NOTE — Telephone Encounter (Signed)
PLEASE NOTE: All timestamps contained within this report are represented as [place] Standard Time. CONFIDENTIALTY NOTICE: This fax transmission is intended only for the addressee. It contains information that is legally privileged, confidential or otherwise protected from use or disclosure. If you are not the intended recipient, you are strictly prohibited from reviewing, disclosing, copying using or disseminating any of this information or taking any action in reliance on or regarding this information. If you have received this fax in error, please notify us immediately by telephone so that we can arrange for its return to [place]. Phone: [phone], Toll-Free: [phone], Fax: [phone] Page: 1 of 2 Call Id: [id number] [facility] Night - Client [place] Patient Name: Kristina Pearson Gender: Female DOB: 1956-02-14 Age: 59 Y 11 M 28 D Return Phone Number: [phone] (Primary), [phone] (Secondary) Address: [address] City/State/Zip: [place] [address] Client [facility] Night - Client Client Site [facility] - Night Physician [place], [place] Type Call Call Type Triage / Clinical Relationship To Patient Self Return Phone Number [phone] (Primary) Chief Complaint Breast Symptoms Initial Comment caller states she woke up with pain under her right breast - it hurts to take a deep breath - she has to take shallow breaths - if she takes a deep breath it hurts - has had breast cancer a yr ago and radiation PreDisposition Go to Urgent Care/Walk-In Clinic Nurse Assessment Nurse: Justine Null, RN, Rodena Piety Date/Time (Eastern Time): 09/30/2014 10:42:09 AM Confirm and document reason for call. If symptomatic, describe symptoms. ---caller states she woke up with pain under her right breast - it hurts to take a deep breath - she has to take shallow breaths - if she takes a deep breath  it hurts - has had breast cancer a yr ago and radiation caller stated that she has has no fevers and has been no coughing and has felt like she needed t cough and has not been able to take deep breathes and has had t take Dtap on Friday Has the patient traveled out of the country within the last 30 days? ---No Does the patient require triage? ---Yes Related visit to physician within the last 2 weeks? ---Yes Does the PT have any chronic conditions? (i.e. diabetes, asthma, etc.) ---Yes List chronic conditions. ---Breast cancer osteoarthritis Guidelines Guideline Title Affirmed Question Affirmed Notes Nurse Date/Time Eilene Ghazi Time) Chest Pain Taking a deep breath makes pain worse Justine Null, RN, Rodena Piety 09/30/2014 10:45:14 AM Disp. Time Eilene Ghazi Time) Disposition Final User 09/30/2014 10:48:13 AM Go to ED Now (or PCP triage) Yes Justine Null, RN, Inis Sizer Understands: Yes Disagree/Comply: Comply PLEASE NOTE: All timestamps contained within this report are represented as [place] Standard Time. CONFIDENTIALTY NOTICE: This fax transmission is intended only for the addressee. It contains information that is legally privileged, confidential or otherwise protected from use or disclosure. If you are not the intended recipient, you are strictly prohibited from reviewing, disclosing, copying using or disseminating any of this information or taking any action in reliance on or regarding this information. If you have received this fax in error, please notify us immediately by telephone so that we can arrange for its return to [place]. Phone: [phone], Toll-Free: [phone], Fax: [phone] Page: 2 of 2 Call Id: [id number] Care Advice Given Per Guideline GO TO ED NOW (OR PCP TRIAGE): * IF NO PCP TRIAGE: You need to be seen. Go to the [facility] at _[facility] Hospital within the next hour. Leave as soon  as you can. BRING MEDICINES: * Please bring a list of your current medicines when you go to see the doctor. *  It is also a good idea to bring the pill bottles too. This will help the doctor to make certain you are taking the right medicines and the right dose. CALL EMS IF: * Severe difficulty breathing occurs * Passes out or becomes too weak to stand * You become worse. CARE ADVICE given per Chest Pain (Adult) guideline. After Care Instructions Given Call Event Type User Date / Time Description Referrals [facility] - ED

## 2014-10-08 ENCOUNTER — Encounter: Payer: Self-pay | Admitting: Family Medicine

## 2014-10-08 ENCOUNTER — Ambulatory Visit (INDEPENDENT_AMBULATORY_CARE_PROVIDER_SITE_OTHER): Payer: 59 | Admitting: Family Medicine

## 2014-10-08 VITALS — BP 101/68 | HR 71 | Temp 99.4°F | Ht 66.0 in | Wt 135.0 lb

## 2014-10-08 DIAGNOSIS — J181 Lobar pneumonia, unspecified organism: Principal | ICD-10-CM

## 2014-10-08 DIAGNOSIS — J189 Pneumonia, unspecified organism: Secondary | ICD-10-CM | POA: Diagnosis not present

## 2014-10-08 MED ORDER — LEVOFLOXACIN 750 MG PO TABS: 750.0000 mg | ORAL_TABLET | Freq: Every day | ORAL | Status: DC

## 2014-10-08 NOTE — Progress Notes (Signed)
   Subjective:    Patient ID: Kristina Pearson, female    DOB: [birth year], 59 y.o.   MRN: [id number]  HPI Here to follow up on an ER visit on 09-30-14 for pleuritic right side pains that were diagnosed as a RML pneumonia on a CT scan. She had no fever and no coughing at the time. She was given a 7 day course of Levaquin, which she finished last night. Now she feels better in the sense that the pleuritic pains are better. However she still feels very fatigued, and she has developed a low grade fever.    Review of Systems  Constitutional: Positive for fever and fatigue. Negative for chills and diaphoresis.  HENT: Positive for congestion. Negative for postnasal drip and sinus pressure.   Eyes: Negative.   Respiratory: Negative.   Cardiovascular: Positive for chest pain. Negative for palpitations and leg swelling.       Objective:   Physical Exam  Constitutional: She appears well-developed and well-nourished. No distress.  Cardiovascular: Normal rate, regular rhythm, normal heart sounds and intact distal pulses.   Pulmonary/Chest: Effort normal and breath sounds normal. No respiratory distress. She has no wheezes. She has no rales.          Assessment & Plan:  Partially treated CAP. Given an additional 7 days of Levaquin 750 mg daily. Recheck prn

## 2014-10-08 NOTE — Progress Notes (Signed)
Pre visit review using our clinic review tool, if applicable. No additional management support is needed unless otherwise documented below in the visit note. 

## 2014-10-16 ENCOUNTER — Telehealth: Payer: Self-pay | Admitting: Family Medicine

## 2014-10-16 DIAGNOSIS — E038 Other specified hypothyroidism: Secondary | ICD-10-CM

## 2014-10-16 NOTE — Telephone Encounter (Signed)
Referral was done  

## 2014-10-16 NOTE — Telephone Encounter (Signed)
Pt needs a referral to see dr balan for low tsh and fatigue. Pt has not seen dr balan in years. Can we refer? Pt said her gyn dr Sabra Heck will fax over her blood work results

## 2014-10-17 ENCOUNTER — Encounter: Payer: Self-pay | Admitting: Family Medicine

## 2014-10-17 ENCOUNTER — Ambulatory Visit (INDEPENDENT_AMBULATORY_CARE_PROVIDER_SITE_OTHER): Payer: 59 | Admitting: Family Medicine

## 2014-10-17 VITALS — BP 95/75 | HR 104 | Temp 98.5°F | Ht 66.0 in | Wt 132.0 lb

## 2014-10-17 DIAGNOSIS — J189 Pneumonia, unspecified organism: Secondary | ICD-10-CM | POA: Diagnosis not present

## 2014-10-17 DIAGNOSIS — J181 Lobar pneumonia, unspecified organism: Principal | ICD-10-CM

## 2014-10-17 MED ORDER — LEVOTHYROXINE SODIUM 125 MCG PO TABS: 125.0000 ug | ORAL_TABLET | Freq: Every day | ORAL | Status: DC

## 2014-10-17 MED ORDER — AMOXICILLIN-POT CLAVULANATE 875-125 MG PO TABS: 1.0000 | ORAL_TABLET | Freq: Two times a day (BID) | ORAL | Status: DC

## 2014-10-17 NOTE — Telephone Encounter (Signed)
I spoke with pt  

## 2014-10-17 NOTE — Progress Notes (Signed)
Pre visit review using our clinic review tool, if applicable. No additional management support is needed unless otherwise documented below in the visit note. 

## 2014-10-17 NOTE — Progress Notes (Signed)
   Subjective:    Patient ID: Kristina Pearson, female    DOB: [birth year], 59 y.o.   MRN: [id number]  HPI Here to follow up on a RML penumonia. She recently finished a second course of Levaquin for this and she seemed to be getting better. However the past few days she has had more right sided pains when coughing or breathing and her overall weakness has returned. No fever.    Review of Systems  Constitutional: Positive for fatigue. Negative for fever.  Respiratory: Positive for cough and shortness of breath. Negative for wheezing.   Cardiovascular: Positive for chest pain. Negative for palpitations and leg swelling.       Objective:   Physical Exam  Constitutional: She appears well-developed and well-nourished. No distress.  HENT:  Right Ear: External ear normal.  Left Ear: External ear normal.  Nose: Nose normal.  Mouth/Throat: Oropharynx is clear and moist.  Eyes: Conjunctivae are normal.  Neck: No thyromegaly present.  Cardiovascular: Normal rate, regular rhythm, normal heart sounds and intact distal pulses.   Pulmonary/Chest: Effort normal and breath sounds normal.  Lymphadenopathy:    She has no cervical adenopathy.          Assessment & Plan:  Partially treated pneumonia. We will treat her with Augmentin for 10 days. At the end of this course, we will get a repeat CXR

## 2014-11-13 ENCOUNTER — Telehealth: Payer: Self-pay | Admitting: Family Medicine

## 2014-11-13 MED ORDER — CIPROFLOXACIN HCL 500 MG PO TABS: 500.0000 mg | ORAL_TABLET | Freq: Two times a day (BID) | ORAL | Status: DC

## 2014-11-13 NOTE — Telephone Encounter (Signed)
I called in script to below number and spoke with pt.

## 2014-11-13 NOTE — Telephone Encounter (Signed)
Pt has a history of uti and would like abx call in [facility] [phone]. Pt is out of town

## 2014-11-13 NOTE — Telephone Encounter (Signed)
Call in Cipro 500 mg bid for 7 days  

## 2014-11-20 ENCOUNTER — Telehealth: Payer: Self-pay | Admitting: Obstetrics & Gynecology

## 2014-11-20 MED ORDER — TERCONAZOLE 0.4 % VA CREA: 1.0000 | TOPICAL_CREAM | Freq: Every day | VAGINAL | Status: DC

## 2014-11-20 NOTE — Telephone Encounter (Signed)
Spoke with patient. She states she has been on treatment with abx x one month for Pneumonia and UTI. Patient was taking probiotics but discontinued probiotics when completed antibiotics. She states she has had 4 days of vaginal itching and irritation. She tried to use OTC treatment and states it burned very badly and cannot continue with more days of the treatment.  Patient is requesting refill of Terazol 7. States Dr. Sabra Heck has prescribed this in the past.     Spoke with Dr. Sabra Heck, received order for Terazol 7, okay to place order and have patient follow up if not improved after treatment.   Called patient, she is advised order is placed to [facility] [address] and to call back if symptoms remain or any concerns. Patient agreeable and verbalized understanding. Routing to provider for final review. Patient agreeable to disposition. Will close encounter.

## 2014-11-20 NOTE — Telephone Encounter (Signed)
Patient has been on antibiotic for over a month and now has a yeast infection. Patient says she cannot use otc yeast medication "it burns". Patient is asking if Dr.Miller would call in a prescription. Patient is aware she may need to come in for an appointment. Last seen 09/28/2014.

## 2014-12-10 ENCOUNTER — Encounter: Payer: 59 | Admitting: Family Medicine

## 2014-12-12 ENCOUNTER — Encounter: Payer: 59 | Admitting: Family Medicine

## 2014-12-16 ENCOUNTER — Other Ambulatory Visit: Payer: Self-pay | Admitting: Family Medicine

## 2015-01-07 ENCOUNTER — Telehealth: Payer: Self-pay | Admitting: Family Medicine

## 2015-01-07 MED ORDER — LISDEXAMFETAMINE DIMESYLATE 40 MG PO CAPS: 40.0000 mg | ORAL_CAPSULE | ORAL | Status: DC

## 2015-01-07 NOTE — Telephone Encounter (Signed)
done

## 2015-01-07 NOTE — Telephone Encounter (Signed)
Pt needs new rx vyvanase 40 mg °

## 2015-01-08 NOTE — Telephone Encounter (Signed)
Script is ready for pick up and I left a voice message.  

## 2015-01-18 ENCOUNTER — Other Ambulatory Visit: Payer: Self-pay | Admitting: Family Medicine

## 2015-01-18 MED ORDER — LEVOTHYROXINE SODIUM 125 MCG PO TABS: 125.0000 ug | ORAL_TABLET | Freq: Every day | ORAL | Status: DC

## 2015-01-22 ENCOUNTER — Encounter: Payer: Commercial Managed Care - HMO | Admitting: Family Medicine

## 2015-02-12 ENCOUNTER — Ambulatory Visit (INDEPENDENT_AMBULATORY_CARE_PROVIDER_SITE_OTHER): Payer: Commercial Managed Care - HMO | Admitting: *Deleted

## 2015-02-12 DIAGNOSIS — M722 Plantar fascial fibromatosis: Secondary | ICD-10-CM

## 2015-02-12 NOTE — Progress Notes (Signed)
Patient ID: Kristina Pearson, female   DOB: [birth year], 59 y.o.   MRN: [id number] Patient presents with list of problems that are preventing her from wearing her orthotics.  After reviewing list and refitting orthotics they are not contouring appropriately we will send them back to manufacturer with new scan to be remade.

## 2015-02-14 ENCOUNTER — Telehealth: Payer: Self-pay | Admitting: Hematology and Oncology

## 2015-02-14 ENCOUNTER — Encounter: Payer: Self-pay | Admitting: Hematology and Oncology

## 2015-02-14 ENCOUNTER — Ambulatory Visit (HOSPITAL_BASED_OUTPATIENT_CLINIC_OR_DEPARTMENT_OTHER): Payer: Commercial Managed Care - HMO | Admitting: Hematology and Oncology

## 2015-02-14 VITALS — BP 111/65 | HR 70 | Temp 98.7°F | Resp 18 | Ht 66.0 in | Wt 135.3 lb

## 2015-02-14 DIAGNOSIS — D0511 Intraductal carcinoma in situ of right breast: Secondary | ICD-10-CM | POA: Diagnosis not present

## 2015-02-14 DIAGNOSIS — Z170 Estrogen receptor positive status [ER+]: Secondary | ICD-10-CM

## 2015-02-14 DIAGNOSIS — Z79810 Long term (current) use of selective estrogen receptor modulators (SERMs): Secondary | ICD-10-CM | POA: Diagnosis not present

## 2015-02-14 DIAGNOSIS — C50411 Malignant neoplasm of upper-outer quadrant of right female breast: Secondary | ICD-10-CM

## 2015-02-14 NOTE — Assessment & Plan Note (Addendum)
Right breast DCIS ER/PR positive: Patient is currently on antiestrogen therapy with tamoxifen and is tolerating it very well without any major problems  Tamoxifen toxicities 1. Superficial phlebitis 2. Tolerating it very well otherwise 3. Breast tenderness could be related to swimming or tamoxifen related: Recommended yoga  Pneumonia: April 2016 treated with 3 rounds of antibiotics. CT scan showed right middle and lower lobe pneumonia. I suspect that there may be a component of radiation pneumonitis in this. But she had marked respiratory distress which has since resolved and she is doing much better.  Right rib pain: It is felt to be musculoskeletal in nature.  Breast cancer Surveillance: 1. Mammograms to be done feb 2016 2. Breast Exam: 02/14/15 Normal: Tenderness in the breast and chest wall on both sides including the axilla. It feels like musculoskeletal in nature no palpable abnormalities  RTC in 6 months

## 2015-02-14 NOTE — Telephone Encounter (Signed)
Gave avs & calendar for February. °

## 2015-02-14 NOTE — Progress Notes (Signed)
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 Valere Dross   11/21/2013 -  Anti-estrogen oral therapy Tamoxifen 20 mg daily   CHIEF COMPLIANT: Pneumonia in April 2016 requiring antibiotics complaining of right chest wall pain  INTERVAL HISTORY: Kristina Pearson is a 59 year old with above-mentioned history of right-sided DCIS treated with lumpectomy and adjuvant radiation. She's been on tamoxifen since June 2015. Overall a pot of hot flashes she has been tolerating tamoxifen extremely well. In April she had profound shortness of breath and was found to have pneumonia and was treated with 3 rounds of antibiotics. Finally her symptoms had improved. She is now back to swimming and staying active. She does not have cough or fever. She continues to have right-sided chest wall discomfort.  REVIEW OF SYSTEMS:   Constitutional: Denies fevers, chills or abnormal weight loss Eyes: Denies blurriness of vision Ears, nose, mouth, throat, and face: Denies mucositis or sore throat Respiratory: Denies cough, dyspnea or wheezes, right-sided chest wall discomfort Cardiovascular: Denies palpitation, chest discomfort or lower extremity swelling Gastrointestinal:  Denies nausea, heartburn or change in bowel habits Skin: Denies abnormal skin rashes Lymphatics: Denies new lymphadenopathy or easy  bruising Neurological:Denies numbness, tingling or new weaknesses Behavioral/Psych: Mood is stable, no new changes  Breast:  denies any pain or lumps or nodules in either breasts All other systems were reviewed with the patient and are negative.  I have reviewed the past medical history, past surgical history, social history and family history with the patient and they are unchanged from previous note.  ALLERGIES:  has No Known Allergies.  MEDICATIONS:  Current Outpatient Prescriptions  Medication Sig Dispense Refill  . ALPRAZolam (XANAX) 0.5 MG tablet Take 1 tablet (0.5 mg total) by mouth 2 (two) times daily as needed for anxiety. 60 tablet 0  . amoxicillin-clavulanate (AUGMENTIN) 875-125 MG per tablet Take 1 tablet by mouth 2 (two) times daily. 20 tablet 0  . aspirin EC 81 MG tablet Take 1 tablet (81 mg total) by mouth daily. 1 tablet 0  . Calcium Carbonate-Vitamin D (CALCIUM 600+D) 600-400 MG-UNIT per tablet Take 1 tablet by mouth 2 (two) times daily. And Vitamin K    . celecoxib (CELEBREX) 200 MG capsule Take 1 capsule (200 mg total) by mouth 2 (two) times daily. 180 capsule 3  . cetirizine (ZYRTEC) 10 MG tablet Take 10 mg by mouth as needed for allergies.    . ciprofloxacin (CIPRO) 500 MG tablet Take 1 tablet (500 mg total) by mouth 2 (two) times daily. 14 tablet 0  . CRANBERRY EXTRACT PO Take 1 tablet by mouth daily as needed.     . cycloSPORINE (RESTASIS) 0.05 % ophthalmic emulsion Place 1 drop into both eyes 2 (two) times daily.    Marland Kitchen HYDROcodone-acetaminophen (NORCO) 10-325 MG per tablet Take 1 tablet by mouth every 6 (six) hours  as needed for severe pain. 120 tablet 0  . HYDROcodone-homatropine (HYCODAN) 5-1.5 MG/5ML syrup Take 5 mLs by mouth every 6 (six) hours as needed. 120 mL 0  . KRILL OIL PO Take by mouth daily.    Marland Kitchen levothyroxine (SYNTHROID, LEVOTHROID) 125 MCG tablet Take 1 tablet (125 mcg total) by mouth daily. 90 tablet 3  . lisdexamfetamine (VYVANSE) 40 MG capsule Take 1  capsule (40 mg total) by mouth every morning. 30 capsule 0  . methocarbamol (ROBAXIN) 500 MG tablet TAKE 1 TABLET BY MOUTH EVERY 6 HOURS AS NEEDED FOR MUSCLE SPASMS 120 tablet 1  . Multiple Vitamin (MULTIVITAMIN) tablet Take 1 tablet by mouth daily.      . multivitamin-lutein (OCUVITE-LUTEIN) CAPS capsule Take 1 capsule by mouth daily.    . Omega-3 Fatty Acids (FISH OIL PO) Take by mouth daily.    . Probiotic Product (PROBIOTIC DAILY PO) Take by mouth.    . ranitidine (ZANTAC) 300 MG tablet Take 1 tablet (300 mg total) by mouth 2 (two) times daily. 180 tablet 3  . tamoxifen (NOLVADEX) 20 MG tablet Take 1 tablet (20 mg total) by mouth daily. 90 tablet 3  . terconazole (TERAZOL 7) 0.4 % vaginal cream Place 1 applicator vaginally at bedtime. For 7 days. 45 g 0  . traZODone (DESYREL) 100 MG tablet TAKE 2 TABLETS EVERY NIGHT 60 tablet 8  . Vaginal Lubricant (REPLENS VA) Place vaginally.     No current facility-administered medications for this visit.    PHYSICAL EXAMINATION: ECOG PERFORMANCE STATUS: 1 - Symptomatic but completely ambulatory  Filed Vitals:   02/14/15 1532  BP: 111/65  Pulse: 70  Temp: 98.7 F (37.1 C)  Resp: 18   Filed Weights   02/14/15 1532  Weight: 135 lb 4.8 oz (61.372 kg)    GENERAL:alert, no distress and comfortable SKIN: skin color, texture, turgor are normal, no rashes or significant lesions EYES: normal, Conjunctiva are pink and non-injected, sclera clear OROPHARYNX:no exudate, no erythema and lips, buccal mucosa, and tongue normal  NECK: supple, thyroid normal size, non-tender, without nodularity LYMPH:  no palpable lymphadenopathy in the cervical, axillary or inguinal LUNGS: clear to auscultation and percussion with normal breathing effort HEART: regular rate & rhythm and no murmurs and no lower extremity edema ABDOMEN:abdomen soft, non-tender and normal bowel sounds Musculoskeletal:no cyanosis of digits and no clubbing  NEURO: alert & oriented x 3 with  fluent speech, no focal motor/sensory deficits BREAST: No palpable masses or nodules in either right or left breasts. No palpable axillary supraclavicular or infraclavicular adenopathy. Tenderness around the scar tissue in the right breast along with tenderness in the rib cage. no palpable nodules (exam performed in the presence of a chaperone)  LABORATORY DATA:  I have reviewed the data as listed   Chemistry      Component Value Date/Time   NA 137 09/30/2014 1216   NA 139 02/21/2014 1111   K 3.9 09/30/2014 1216   K 4.4 02/21/2014 1111   CL 101 09/30/2014 1216   CO2 28 09/30/2014 1216   CO2 27 02/21/2014 1111   BUN 7 09/30/2014 1216   BUN 16.4 02/21/2014 1111   CREATININE 0.95 09/30/2014 1216   CREATININE 0.88 09/28/2014 1419   CREATININE 1.0 02/21/2014 1111      Component Value Date/Time   CALCIUM 8.9 09/30/2014 1216   CALCIUM 9.0 02/21/2014 1111   ALKPHOS 35* 09/30/2014 1216   ALKPHOS 33* 02/21/2014 1111   AST 18 09/30/2014 1216  AST 21 02/21/2014 1111   ALT 14 09/30/2014 1216   ALT 22 02/21/2014 1111   BILITOT 0.7 09/30/2014 1216   BILITOT 0.47 02/21/2014 1111       Lab Results  Component Value Date   WBC 6.6 09/30/2014   HGB 11.6* 09/30/2014   HCT 34.9* 09/30/2014   MCV 95.1 09/30/2014   PLT 205 09/30/2014   NEUTROABS 4.8 09/30/2014   ASSESSMENT & PLAN:  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 Superficial phlebitis 2. Tolerating it very well otherwise 3. Breast tenderness could be related to swimming or tamoxifen related: Recommended yoga  Pneumonia: April 2016 treated with 3 rounds of antibiotics. CT scan showed right middle and lower lobe pneumonia. I suspect that there may be a component of radiation pneumonitis in this. But she had marked respiratory distress  which has since resolved and she is doing much better.  Right rib pain: It is felt to be musculoskeletal in nature.  Breast cancer Surveillance: 1. Mammograms to be done feb 2016 2. Breast Exam: 02/14/15 Normal: Tenderness in the breast and chest wall on both sides including the axilla. It feels like musculoskeletal in nature no palpable abnormalities  RTC in 6 months   No orders of the defined types were placed in this encounter.   The patient has a good understanding of the overall plan. she agrees with it. she will call with any problems that may develop before the next visit here.   Rulon Eisenmenger, MD

## 2015-02-28 ENCOUNTER — Telehealth: Payer: Self-pay | Admitting: Family Medicine

## 2015-02-28 MED ORDER — ALPRAZOLAM 0.5 MG PO TABS: 0.5000 mg | ORAL_TABLET | Freq: Two times a day (BID) | ORAL | Status: DC

## 2015-02-28 NOTE — Telephone Encounter (Signed)
Ready to fax  

## 2015-02-28 NOTE — Telephone Encounter (Signed)
Script was printed and faxed to [facility].  

## 2015-02-28 NOTE — Telephone Encounter (Signed)
Refill request for Alprazolam 0.5 mg take 1 po bid prn and send to [facility].

## 2015-03-05 ENCOUNTER — Ambulatory Visit: Payer: Commercial Managed Care - HMO | Admitting: *Deleted

## 2015-03-05 DIAGNOSIS — M779 Enthesopathy, unspecified: Secondary | ICD-10-CM

## 2015-03-05 NOTE — Progress Notes (Signed)
Patient ID: Kristina Pearson, female   DOB: 03-Sep-1955, 59 y.o.   MRN: [id number] Patient presents for orthotic pick up.  Verbal and written break in and wear instructions given.  Patient will follow up in 4 weeks if symptoms worsen or fail to improve.

## 2015-03-05 NOTE — Patient Instructions (Signed)

## 2015-03-27 ENCOUNTER — Encounter: Payer: Commercial Managed Care - HMO | Admitting: Family Medicine

## 2015-03-28 ENCOUNTER — Telehealth: Payer: Self-pay | Admitting: *Deleted

## 2015-03-28 NOTE — Telephone Encounter (Signed)
Tried to return patients call no answer left message to call back

## 2015-03-28 NOTE — Telephone Encounter (Signed)
Pt request to speak with Melody. 

## 2015-04-02 NOTE — Telephone Encounter (Signed)
04/02/15 2:50pm patient returned call stating that her orthotics are causing hip pain, she can't wear them.  Left message for patient to call and schedule an appointment with the doctor as this is an issue I can not assist her with.

## 2015-04-10 ENCOUNTER — Other Ambulatory Visit: Payer: Self-pay | Admitting: Hematology and Oncology

## 2015-04-10 ENCOUNTER — Other Ambulatory Visit: Payer: Self-pay

## 2015-04-10 DIAGNOSIS — Z853 Personal history of malignant neoplasm of breast: Secondary | ICD-10-CM

## 2015-04-30 ENCOUNTER — Ambulatory Visit: Payer: Commercial Managed Care - HMO | Admitting: Podiatry

## 2015-05-07 ENCOUNTER — Ambulatory Visit (INDEPENDENT_AMBULATORY_CARE_PROVIDER_SITE_OTHER): Payer: Commercial Managed Care - HMO | Admitting: Podiatry

## 2015-05-07 VITALS — BP 95/53 | HR 77 | Resp 16

## 2015-05-07 DIAGNOSIS — M722 Plantar fascial fibromatosis: Secondary | ICD-10-CM

## 2015-05-07 NOTE — Progress Notes (Signed)
She presents today for follow-up of orthotics and her heel pain. She states that she is having considerable heel pain as well as posterior tibial tendon pain and pain that radiates up the medial aspect of her left foot into her left leg. She states that the orthotics don't seem to be fitting right she still has pain with the right orthotic particularly as the heel moves into the arch. She states that she feels like she is walking on that area with no support in the arch.  Objective: Vital signs are stable she is alert and oriented 3. Pulses are palpable. She has tenderness on palpation medial calcaneal tubercle of the right heel. I evaluated the orthotics and the right orthotic does appear that the arch has slid back to far in the heel area..  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  Plan: We will send her orthotic back to have the right side corrected. Also dispensed her at no charge plantar fascial braces.

## 2015-05-12 ENCOUNTER — Other Ambulatory Visit: Payer: Self-pay | Admitting: Hematology and Oncology

## 2015-05-13 ENCOUNTER — Other Ambulatory Visit: Payer: Self-pay | Admitting: *Deleted

## 2015-05-13 DIAGNOSIS — C50411 Malignant neoplasm of upper-outer quadrant of right female breast: Secondary | ICD-10-CM

## 2015-05-13 MED ORDER — TAMOXIFEN CITRATE 20 MG PO TABS: 20.0000 mg | ORAL_TABLET | Freq: Every day | ORAL | Status: DC

## 2015-05-14 ENCOUNTER — Ambulatory Visit (INDEPENDENT_AMBULATORY_CARE_PROVIDER_SITE_OTHER): Payer: Commercial Managed Care - HMO | Admitting: Family Medicine

## 2015-05-14 ENCOUNTER — Encounter: Payer: Self-pay | Admitting: Family Medicine

## 2015-05-14 VITALS — BP 106/73 | HR 80 | Temp 98.3°F | Ht 66.0 in | Wt 138.0 lb

## 2015-05-14 DIAGNOSIS — E039 Hypothyroidism, unspecified: Secondary | ICD-10-CM

## 2015-05-14 DIAGNOSIS — Z0000 Encounter for general adult medical examination without abnormal findings: Secondary | ICD-10-CM | POA: Diagnosis not present

## 2015-05-14 DIAGNOSIS — Z23 Encounter for immunization: Secondary | ICD-10-CM | POA: Diagnosis not present

## 2015-05-14 DIAGNOSIS — M1990 Unspecified osteoarthritis, unspecified site: Secondary | ICD-10-CM

## 2015-05-14 MED ORDER — LISDEXAMFETAMINE DIMESYLATE 40 MG PO CAPS: 40.0000 mg | ORAL_CAPSULE | ORAL | Status: DC

## 2015-05-14 MED ORDER — HYDROCODONE-ACETAMINOPHEN 10-325 MG PO TABS: 1.0000 | ORAL_TABLET | Freq: Four times a day (QID) | ORAL | Status: DC | PRN

## 2015-05-14 NOTE — Progress Notes (Signed)
Subjective:    Patient ID: Kristina Pearson, female    DOB: 03/30/1956, 59 y.o.   MRN: [id number]  HPI 59 yr old female for a cpx. She is doing well except for diffuse joint pains. She has been diagnosed with osteoarthritis and she takes Celebrex and occasional Norco, but she still struggles with daily pain. She asks to see a specialist about this.    Review of Systems  Constitutional: Negative.  Negative for fever, diaphoresis, activity change, appetite change, fatigue and unexpected weight change.  HENT: Negative.  Negative for congestion, ear pain, hearing loss, nosebleeds, sore throat, tinnitus, trouble swallowing and voice change.   Eyes: Negative.  Negative for photophobia, pain, discharge, redness and visual disturbance.  Respiratory: Negative.  Negative for apnea, cough, choking, chest tightness, shortness of breath, wheezing and stridor.   Cardiovascular: Negative.  Negative for chest pain, palpitations and leg swelling.  Gastrointestinal: Negative.  Negative for nausea, vomiting, abdominal pain, diarrhea, constipation, blood in stool, abdominal distention and rectal pain.  Genitourinary: Negative.  Negative for dysuria, urgency, frequency, hematuria, flank pain, vaginal bleeding, vaginal discharge, enuresis, difficulty urinating, vaginal pain and menstrual problem.  Musculoskeletal: Positive for arthralgias. Negative for myalgias, back pain, joint swelling, gait problem, neck pain and neck stiffness.  Skin: Negative.  Negative for color change, pallor, rash and wound.  Neurological: Negative.  Negative for dizziness, tremors, seizures, syncope, speech difficulty, weakness, light-headedness, numbness and headaches.  Hematological: Negative for adenopathy. Does not bruise/bleed easily.  Psychiatric/Behavioral: Negative.  Negative for hallucinations, behavioral problems, confusion, sleep disturbance, dysphoric mood and agitation. The patient is not nervous/anxious.        Objective:   Physical Exam  Constitutional: She appears well-developed and well-nourished. No distress.  HENT:  Head: Normocephalic and atraumatic.  Right Ear: External ear normal.  Left Ear: External ear normal.  Nose: Nose normal.  Mouth/Throat: Oropharynx is clear and moist. No oropharyngeal exudate.  Eyes: Conjunctivae and EOM are normal. Pupils are equal, round, and reactive to light. Right eye exhibits no discharge. Left eye exhibits no discharge. No scleral icterus.  Neck: Normal range of motion. Neck supple. No JVD present. No thyromegaly present.  Cardiovascular: Normal rate, regular rhythm, normal heart sounds and intact distal pulses.  Exam reveals no gallop and no friction rub.   No murmur heard. Pulmonary/Chest: Effort normal and breath sounds normal. No stridor. No respiratory distress. She has no wheezes. She has no rales. She exhibits no tenderness.  Abdominal: Soft. Normal appearance and bowel sounds are normal. She exhibits no distension, no abdominal bruit, no ascites and no mass. There is no hepatosplenomegaly. There is no tenderness. There is no rigidity, no rebound and no guarding. No hernia.  Genitourinary: Rectum normal, vagina normal and uterus normal. No breast swelling, tenderness, discharge or bleeding. Cervix exhibits no motion tenderness, no discharge and no friability. Right adnexum displays no mass, no tenderness and no fullness. Left adnexum displays no mass, no tenderness and no fullness. No erythema, tenderness or bleeding in the vagina. No vaginal discharge found.  Musculoskeletal: Normal range of motion. She exhibits no edema or tenderness.  Lymphadenopathy:    She has no cervical adenopathy.  Neurological: She is alert. She has normal reflexes. No cranial nerve deficit. She exhibits normal muscle tone. Coordination normal.  Skin: Skin is warm and dry. No rash noted. She is not diaphoretic. No erythema. No pallor.  Psychiatric: She has a normal mood and affect. Her  behavior is normal. Judgment and thought  content normal.          Assessment & Plan:  Well exam. We discussed diet and exercise. Refer to Rheumatology. Get labs.

## 2015-05-14 NOTE — Progress Notes (Signed)
Pre visit review using our clinic review tool, if applicable. No additional management support is needed unless otherwise documented below in the visit note. 

## 2015-05-15 LAB — BASIC METABOLIC PANEL
BUN: 17 mg/dL (ref 6–23)
CALCIUM: 9.5 mg/dL (ref 8.4–10.5)
CO2: 29 mEq/L (ref 19–32)
CREATININE: 0.95 mg/dL (ref 0.40–1.20)
Chloride: 102 mEq/L (ref 96–112)
GFR: 63.86 mL/min (ref >60.00)
Glucose, Bld: 94 mg/dL (ref 70–99)
Potassium: 4.8 mEq/L (ref 3.5–5.1)
Sodium: 138 mEq/L (ref 135–145)

## 2015-05-15 LAB — FOLATE: FOLATE: 23.2 ng/mL (ref >5.9)

## 2015-05-15 LAB — VITAMIN D 25 HYDROXY (VIT D DEFICIENCY, FRACTURES): VITD: 57.06 ng/mL (ref 30.00–100.00)

## 2015-05-15 LAB — TSH: TSH: 0.13 u[IU]/mL — AB (ref 0.35–4.50)

## 2015-05-21 ENCOUNTER — Other Ambulatory Visit: Payer: Self-pay | Admitting: Family Medicine

## 2015-05-22 ENCOUNTER — Encounter: Payer: Self-pay | Admitting: Family Medicine

## 2015-05-23 ENCOUNTER — Other Ambulatory Visit (INDEPENDENT_AMBULATORY_CARE_PROVIDER_SITE_OTHER): Payer: Commercial Managed Care - HMO

## 2015-05-23 ENCOUNTER — Encounter: Payer: Self-pay | Admitting: Family Medicine

## 2015-05-23 DIAGNOSIS — E039 Hypothyroidism, unspecified: Secondary | ICD-10-CM

## 2015-05-23 LAB — T4, FREE: Free T4: 1.19 ng/dL (ref 0.60–1.60)

## 2015-05-23 LAB — TSH: TSH: 0.06 u[IU]/mL — AB (ref 0.35–4.50)

## 2015-05-23 LAB — T3, FREE: T3 FREE: 2.8 pg/mL (ref 2.3–4.2)

## 2015-05-23 NOTE — Telephone Encounter (Signed)
I spoke with pt and went over lab results.  

## 2015-05-23 NOTE — Addendum Note (Signed)
Addended by: [name] A on: 05/23/2015 11:03 AM   Modules accepted: Orders

## 2015-05-24 ENCOUNTER — Telehealth: Payer: Self-pay | Admitting: Family Medicine

## 2015-05-24 NOTE — Telephone Encounter (Signed)
Pt call today asking for lab results .Marland Kitchen Would like a call back

## 2015-05-24 NOTE — Telephone Encounter (Signed)
I agree. Take the Synthroid first thing in the morning by itself, and take the Tamoxifen at night

## 2015-05-27 NOTE — Telephone Encounter (Signed)
See the result note

## 2015-05-27 NOTE — Telephone Encounter (Signed)
I spoke with pt and gave lab results.

## 2015-05-30 ENCOUNTER — Telehealth: Payer: Self-pay | Admitting: *Deleted

## 2015-05-30 ENCOUNTER — Ambulatory Visit: Payer: Commercial Managed Care - HMO | Admitting: Podiatry

## 2015-05-30 NOTE — Telephone Encounter (Signed)
Pt states she is having trouble with her left orthotic, it seems they have added the lift again when they were only modifying the arch.  Pt states Dr. Milinda Pointer stated if she continued to have trouble with the othothic, she should schedule an appt for a plaster case of her feet to have orthotics made from.  Referred to scheduler, Priscille Loveless.

## 2015-05-30 NOTE — Telephone Encounter (Signed)
Ok thanks 

## 2015-06-04 ENCOUNTER — Encounter: Payer: Commercial Managed Care - HMO | Admitting: Podiatry

## 2015-06-08 ENCOUNTER — Ambulatory Visit (INDEPENDENT_AMBULATORY_CARE_PROVIDER_SITE_OTHER): Payer: Commercial Managed Care - HMO

## 2015-06-08 ENCOUNTER — Ambulatory Visit (INDEPENDENT_AMBULATORY_CARE_PROVIDER_SITE_OTHER): Payer: Commercial Managed Care - HMO | Admitting: Family Medicine

## 2015-06-08 VITALS — BP 100/62 | HR 58 | Temp 98.4°F | Resp 16 | Ht 66.0 in | Wt 138.0 lb

## 2015-06-08 DIAGNOSIS — M25572 Pain in left ankle and joints of left foot: Secondary | ICD-10-CM

## 2015-06-08 DIAGNOSIS — S93602A Unspecified sprain of left foot, initial encounter: Secondary | ICD-10-CM | POA: Diagnosis not present

## 2015-06-08 DIAGNOSIS — M79672 Pain in left foot: Secondary | ICD-10-CM

## 2015-06-08 DIAGNOSIS — S93402A Sprain of unspecified ligament of left ankle, initial encounter: Secondary | ICD-10-CM | POA: Diagnosis not present

## 2015-06-08 NOTE — Patient Instructions (Signed)
1. RETURN IN 5-7 DAYS  IF YOU ARE UNABLE TO BEAR WEIGHT WITHOUT SIGNIFICANT PAIN.   Acute Ankle Sprain With Phase I Rehab An acute ankle sprain is a partial or complete tear in one or more of the ligaments of the ankle due to traumatic injury. The severity of the injury depends on both the number of ligaments sprained and the grade of sprain. There are 3 grades of sprains.   A grade 1 sprain is a mild sprain. There is a slight pull without obvious tearing. There is no loss of strength, and the muscle and ligament are the correct length.  A grade 2 sprain is a moderate sprain. There is tearing of fibers within the substance of the ligament where it connects two bones or two cartilages. The length of the ligament is increased, and there is usually decreased strength.  A grade 3 sprain is a complete rupture of the ligament and is uncommon. In addition to the grade of sprain, there are three types of ankle sprains.  Lateral ankle sprains: This is a sprain of one or more of the three ligaments on the outer side (lateral) of the ankle. These are the most common sprains. Medial ankle sprains: There is one large triangular ligament of the inner side (medial) of the ankle that is susceptible to injury. Medial ankle sprains are less common. Syndesmosis, "high ankle," sprains: The syndesmosis is the ligament that connects the two bones of the lower leg. Syndesmosis sprains usually only occur with very severe ankle sprains. SYMPTOMS  Pain, tenderness, and swelling in the ankle, starting at the side of injury that may progress to the whole ankle and foot with time.  "Pop" or tearing sensation at the time of injury.  Bruising that may spread to the heel.  Impaired ability to walk soon after injury. CAUSES   Acute ankle sprains are caused by trauma placed on the ankle that temporarily forces or pries the anklebone (talus) out of its normal socket.  Stretching or tearing of the ligaments that normally  hold the joint in place (usually due to a twisting injury). RISK INCREASES WITH:  Previous ankle sprain.  Sports in which the foot may land awkwardly (i.e., basketball, volleyball, or soccer) or walking or running on uneven or rough surfaces.  Shoes with inadequate support to prevent sideways motion when stress occurs.  Poor strength and flexibility.  Poor balance skills.  Contact sports. PREVENTION   Warm up and stretch properly before activity.  Maintain physical fitness:  Ankle and leg flexibility, muscle strength, and endurance.  Cardiovascular fitness.  Balance training activities.  Use proper technique and have a coach correct improper technique.  Taping, protective strapping, bracing, or high-top tennis shoes may help prevent injury. Initially, tape is best; however, it loses most of its support function within 10 to 15 minutes.  Wear proper-fitted protective shoes (High-top shoes with taping or bracing is more effective than either alone).  Provide the ankle with support during sports and practice activities for 12 months following injury. PROGNOSIS   If treated properly, ankle sprains can be expected to recover completely; however, the length of recovery depends on the degree of injury.  A grade 1 sprain usually heals enough in 5 to 7 days to allow modified activity and requires an average of 6 weeks to heal completely.  A grade 2 sprain requires 6 to 10 weeks to heal completely.  A grade 3 sprain requires 12 to 16 weeks to heal.  A syndesmosis  sprain often takes more than 3 months to heal. RELATED COMPLICATIONS   Frequent recurrence of symptoms may result in a chronic problem. Appropriately addressing the problem the first time decreases the frequency of recurrence and optimizes healing time. Severity of the initial sprain does not predict the likelihood of later instability.  Injury to other structures (bone, cartilage, or tendon).  A chronically unstable  or arthritic ankle joint is a possibility with repeated sprains. TREATMENT Treatment initially involves the use of ice, medication, and compression bandages to help reduce pain and inflammation. Ankle sprains are usually immobilized in a walking cast or boot to allow for healing. Crutches may be recommended to reduce pressure on the injury. After immobilization, strengthening and stretching exercises may be necessary to regain strength and a full range of motion. Surgery is rarely needed to treat ankle sprains. MEDICATION   Nonsteroidal anti-inflammatory medications, such as aspirin and ibuprofen (do not take for the first 3 days after injury or within 7 days before surgery), or other minor pain relievers, such as acetaminophen, are often recommended. Take these as directed by your caregiver. Contact your caregiver immediately if any bleeding, stomach upset, or signs of an allergic reaction occur from these medications.  Ointments applied to the skin may be helpful.  Pain relievers may be prescribed as necessary by your caregiver. Do not take prescription pain medication for longer than 4 to 7 days. Use only as directed and only as much as you need. HEAT AND COLD  Cold treatment (icing) is used to relieve pain and reduce inflammation for acute and chronic cases. Cold should be applied for 10 to 15 minutes every 2 to 3 hours for inflammation and pain and immediately after any activity that aggravates your symptoms. Use ice packs or an ice massage.  Heat treatment may be used before performing stretching and strengthening activities prescribed by your caregiver. Use a heat pack or a warm soak. SEEK IMMEDIATE MEDICAL CARE IF:   Pain, swelling, or bruising worsens despite treatment.  You experience pain, numbness, discoloration, or coldness in the foot or toes.  New, unexplained symptoms develop (drugs used in treatment may produce side effects.) EXERCISES  PHASE I EXERCISES RANGE OF MOTION (ROM)  AND STRETCHING EXERCISES - Ankle Sprain, Acute Phase I, Weeks 1 to 2 These exercises may help you when beginning to restore flexibility in your ankle. You will likely work on these exercises for the 1 to 2 weeks after your injury. Once your physician, physical therapist, or athletic trainer sees adequate progress, he or she will advance your exercises. While completing these exercises, remember:   Restoring tissue flexibility helps normal motion to return to the joints. This allows healthier, less painful movement and activity.  An effective stretch should be held for at least 30 seconds.  A stretch should never be painful. You should only feel a gentle lengthening or release in the stretched tissue. RANGE OF MOTION - Dorsi/Plantar Flexion  While sitting with your right / left knee straight, draw the top of your foot upwards by flexing your ankle. Then reverse the motion, pointing your toes downward.  Hold each position for __________ seconds.  After completing your first set of exercises, repeat this exercise with your knee bent. Repeat __________ times. Complete this exercise __________ times per day.  RANGE OF MOTION - Ankle Alphabet  Imagine your right / left big toe is a pen.  Keeping your hip and knee still, write out the entire alphabet with your "pen." Make  the letters as large as you can without increasing any discomfort. Repeat __________ times. Complete this exercise __________ times per day.  STRENGTHENING EXERCISES - Ankle Sprain, Acute -Phase I, Weeks 1 to 2 These exercises may help you when beginning to restore strength in your ankle. You will likely work on these exercises for 1 to 2 weeks after your injury. Once your physician, physical therapist, or athletic trainer sees adequate progress, he or she will advance your exercises. While completing these exercises, remember:   Muscles can gain both the endurance and the strength needed for everyday activities through controlled  exercises.  Complete these exercises as instructed by your physician, physical therapist, or athletic trainer. Progress the resistance and repetitions only as guided.  You may experience muscle soreness or fatigue, but the pain or discomfort you are trying to eliminate should never worsen during these exercises. If this pain does worsen, stop and make certain you are following the directions exactly. If the pain is still present after adjustments, discontinue the exercise until you can discuss the trouble with your clinician. STRENGTH - Dorsiflexors  Secure a rubber exercise band/tubing to a fixed object (i.e., table, pole) and loop the other end around your right / left foot.  Sit on the floor facing the fixed object. The band/tubing should be slightly tense when your foot is relaxed.  Slowly draw your foot back toward you using your ankle and toes.  Hold this position for __________ seconds. Slowly release the tension in the band and return your foot to the starting position. Repeat __________ times. Complete this exercise __________ times per day.  STRENGTH - Plantar-flexors   Sit with your right / left leg extended. Holding onto both ends of a rubber exercise band/tubing, loop it around the ball of your foot. Keep a slight tension in the band.  Slowly push your toes away from you, pointing them downward.  Hold this position for __________ seconds. Return slowly, controlling the tension in the band/tubing. Repeat __________ times. Complete this exercise __________ times per day.  STRENGTH - Ankle Eversion  Secure one end of a rubber exercise band/tubing to a fixed object (table, pole). Loop the other end around your foot just before your toes.  Place your fists between your knees. This will focus your strengthening at your ankle.  Drawing the band/tubing across your opposite foot, slowly, pull your little toe out and up. Make sure the band/tubing is positioned to resist the entire  motion.  Hold this position for __________ seconds. Have your muscles resist the band/tubing as it slowly pulls your foot back to the starting position.  Repeat __________ times. Complete this exercise __________ times per day.  STRENGTH - Ankle Inversion  Secure one end of a rubber exercise band/tubing to a fixed object (table, pole). Loop the other end around your foot just before your toes.  Place your fists between your knees. This will focus your strengthening at your ankle.  Slowly, pull your big toe up and in, making sure the band/tubing is positioned to resist the entire motion.  Hold this position for __________ seconds.  Have your muscles resist the band/tubing as it slowly pulls your foot back to the starting position. Repeat __________ times. Complete this exercises __________ times per day.  STRENGTH - Towel Curls  Sit in a chair positioned on a non-carpeted surface.  Place your right / left foot on a towel, keeping your heel on the floor.  Pull the towel toward your heel by  only curling your toes. Keep your heel on the floor.  If instructed by your physician, physical therapist, or athletic trainer, add weight to the end of the towel. Repeat __________ times. Complete this exercise __________ times per day.   This information is not intended to replace advice given to you by your health care provider. Make sure you discuss any questions you have with your health care provider.   Document Released: 01/07/2005 Document Revised: 06/29/2014 Document Reviewed: 09/20/2008 [employer] Interactive Patient Education [employer].

## 2015-06-08 NOTE — Progress Notes (Signed)
Subjective:    Patient ID: Kristina Pearson, female    DOB: 02-Aug-1955, 59 y.o.   MRN: [id number]  06/08/2015  Foot Pain   HPI This 59 y.o. female presents for evaluation of L ankle pain.  Unable to bear weight today.  Going down the steps on patio and bottom step is smaller; heel caught and ankle twisted and landed on foot.  Horrible.  Office was closed.  Elevated foot.  Iced all night.  Swelling. Has celebrex and helped with swelling this morning. Had left over narcotic from back injury; helpful for pain control.   Has hypothyroidism and osteopenia. Hydrocodone and Methocarbamol this morning at 8:00am.  Review of Systems  Constitutional: Negative for fever, chills, diaphoresis and fatigue.  Musculoskeletal: Positive for joint swelling, arthralgias and gait problem.  Neurological: Negative for weakness and numbness.    Past Medical History  Diagnosis Date  . History of depression   . Allergy   . Hypothyroid   . Sialoadenitis   . GERD (gastroesophageal reflux disease)   . Headache(784.0)   . Arthritis     osteoarthritrs  . ADHD (attention deficit hyperactivity disorder)     ADD  . PONV (postoperative nausea and vomiting)   . Wears glasses   . Breast cancer ([place]) 08/15/13    right lateral upper outer  . Hx of radiation therapy 10/19/13- 11/09/13    right breast 4256 cGy in 16 sessions, hypo-fractionated  . Fibromyalgia   . Migraine   . Urinary incontinence   . Spinal stenosis   . Vitiligo   . Osteopenia    Past Surgical History  Procedure Laterality Date  . Diagnostic laparoscopy  1982    exp  . Cholecystectomy  1996    lap  . Breast lumpectomy with radioactive seed localization Right 08/30/2013    Procedure: RIGHT PARTIAL MASTECTOMY WITH RADIOACTIVE SEED LOCALIZATION;  Surgeon: [name], MD;  Location: [facility];  Service: General;  Laterality: Right;  . Colonoscopy  1998    per Dr. Earlean Shawl, clear, repeat in 10 yrs  . Laparoscopic ovarian  cystectomy    . Urethral dilation    . Breast surgery     No Known Allergies  Social History   Social History  . Marital Status: Married    Spouse Name: N/A  . Number of Children: 1  . Years of Education: N/A   Occupational History  . Not on file.   Social History Main Topics  . Smoking status: Former Smoker    Quit date: 09/14/1978  . Smokeless tobacco: Never Used     Comment: short time in college  . Alcohol Use: 0.6 - 1.2 oz/week    1-2 Standard drinks or equivalent per week  . Drug Use: No  . Sexual Activity:    Partners: Male     Comment: menarche age 46, first live birth age 45, menopause age 68, no HRT   Other Topics Concern  . Not on file   Social History Narrative   Family History  Problem Relation Age of Onset  . Arthritis Mother   . Diabetes Mother   . Osteoporosis Mother   . Polymyalgia rheumatica Mother     on prednisone  . Heart disease Mother   . Hyperlipidemia Mother   . Hypertension Mother   . Stroke Mother   . Heart attack Father   . Heart disease Father   . Colon cancer Maternal Uncle     dx in  his 60s  . Colon cancer Cousin 38    mother's maternal first cousin  . Breast cancer Cousin 9    BRCA negative; father's maternal first cousin  . Leukemia Maternal Uncle 81    AML  . Melanoma Maternal Uncle 73  . Leukemia Maternal Uncle 18  . Ovarian cancer Cousin 10    maternal cousin  . Ovarian cancer Cousin 24    maternal cousin  . Leukemia Cousin     dx in his 25s; maternal cousin; thought to be the result of taking [place]  . Heart attack Paternal Uncle   . Colon cancer Cousin 74    maternal second cousin  . Breast cancer Other     maternal great aunt dx in her 6s  . Ovarian cancer Other     maternal grandfather's sister dx in her 53s  . Colon cancer Other     maternal grandfather's sister dx in her 49s  . Colon cancer Other     maternal grandfather's sister dx in her 79s  . Other Daughter     dermoid tumor  . Diabetes  Paternal Grandmother   . Skin cancer Paternal Grandmother   . Hypertension Paternal Grandmother        Objective:    BP 100/62 mmHg  Pulse 58  Temp(Src) 98.4 F (36.9 C) (Oral)  Resp 16  Ht 5' 6" (1.676 m)  Wt 138 lb (62.596 kg)  BMI 22.28 kg/m2  SpO2 98%  LMP 09/21/2006 Physical Exam  Constitutional: She appears well-developed and well-nourished. No distress.  Cardiovascular: Intact distal pulses.   L foot DP pulse 2+; capillary refill 3 seconds L foot.  Musculoskeletal:       Left ankle: She exhibits decreased range of motion and swelling. She exhibits no ecchymosis, no deformity, no laceration and normal pulse. Tenderness. Medial malleolus tenderness found. No lateral malleolus and no head of 5th metatarsal tenderness found. Achilles tendon normal. Achilles tendon exhibits no pain.       Left lower leg: She exhibits tenderness. She exhibits no bony tenderness, no swelling, no edema, no deformity and no laceration.       Left foot: There is decreased range of motion, tenderness, bony tenderness and swelling. There is normal capillary refill, no crepitus, no deformity and no laceration.  TTP proximal L foot with bruising lateral proximal foot.  +swelling.  Skin: She is not diaphoretic.   Results for orders placed or performed in visit on 05/23/15  TSH  Result Value Ref Range   TSH 0.06 (L) 0.35 - 4.50 uIU/mL  T3, Free  Result Value Ref Range   T3, Free 2.8 2.3 - 4.2 pg/mL  T4, free  Result Value Ref Range   Free T4 1.19 0.60 - 1.60 ng/dL   [facility] reading (PRIMARY) by  Dr. Tamala Julian.  L ANKLE: NAD; L FOOT: OLD CUNEIFORM FIRST AVULSION; NO ACUTE.      Assessment & Plan:   1. Left foot pain   2. Left ankle pain   3. Ankle sprain, left, initial encounter   4. Foot sprain, left, initial encounter    -New. -rest, ice, elevate.  Celebrex daily. -if no improvement in one week, RTC for reevaluation. -crutches provided to use for the next 2-4 days. If unable to bear weight in  5-7 days, RTC.   Orders Placed This Encounter  Procedures  . DG Ankle Complete Left    Standing Status: Future     Number of Occurrences: 1  Standing Expiration Date: 06/07/2016    Order Specific Question:  Reason for Exam (SYMPTOM  OR DIAGNOSIS REQUIRED)    Answer:  L medial ankle pain after fall down stairs; L proximal foot pain wiht swelling; unable to bear weight.    Order Specific Question:  Is the patient pregnant?    Answer:  No    Order Specific Question:  Preferred imaging location?    Answer:  External  . DG Foot Complete Left    Standing Status: Future     Number of Occurrences: 1     Standing Expiration Date: 06/07/2016    Order Specific Question:  Reason for Exam (SYMPTOM  OR DIAGNOSIS REQUIRED)    Answer:  L medial ankle pain after fall down stairs; L proximal foot pain wiht swelling; unable to bear weight.    Order Specific Question:  Is the patient pregnant?    Answer:  No    Order Specific Question:  Preferred imaging location?    Answer:  External   No orders of the defined types were placed in this encounter.    No Follow-up on file.    Kristi Elayne Guerin, M.D. [facility] [address] [place], [place]  [address] [phone] phone [phone] fax

## 2015-06-11 ENCOUNTER — Other Ambulatory Visit: Payer: Self-pay | Admitting: Family Medicine

## 2015-06-11 MED ORDER — PROMETHAZINE HCL 25 MG PO TABS: 25.0000 mg | ORAL_TABLET | ORAL | Status: DC | PRN

## 2015-06-11 NOTE — Telephone Encounter (Signed)
There is no Phenergan patch that I am aware of. Call in Phenergan 25 mg tabs to take every 4 hours prn nausea, #60 with 2 rf

## 2015-06-11 NOTE — Telephone Encounter (Signed)
Pt notified that pheregan tabs sent to the pharmacy by e-scribe.  She will call back if needed.

## 2015-06-11 NOTE — Telephone Encounter (Signed)
Pt is having nausea and would like to have new rx phenergan patch call into [facility] [address]

## 2015-07-11 ENCOUNTER — Telehealth: Payer: Self-pay | Admitting: Family Medicine

## 2015-07-11 NOTE — Telephone Encounter (Signed)
Prior Authorization for lisdexamfetamine (VYVANSE) 40 MG capsule has been denied.  Medication authorization requires all of the following for the diagnosis of ADHD or ADD:  1. Symptoms have been present prior to 60 years of age: AND 2. Prescription is written by or in consultation with a mental health specialist.

## 2015-07-11 NOTE — Progress Notes (Signed)
error 

## 2015-07-12 NOTE — Telephone Encounter (Signed)
Unable to leave message for patient - voicemail box was full. Will try again later.

## 2015-07-12 NOTE — Telephone Encounter (Signed)
Tell the patient that if she wants to stay on Vyvanse her insurance company will force her to see a Psychiatrist. Otherwise we can try something else

## 2015-07-15 NOTE — Telephone Encounter (Signed)
I sent pt a my chart message with the below information.  

## 2015-07-24 NOTE — Progress Notes (Signed)
error 

## 2015-08-12 ENCOUNTER — Ambulatory Visit
Admission: RE | Admit: 2015-08-12 | Discharge: 2015-08-12 | Disposition: A | Discharge status: Home / Self Care | Payer: Managed Care, Other (non HMO) | Source: Ambulatory Visit | Attending: Hematology and Oncology | Admitting: Hematology and Oncology

## 2015-08-12 DIAGNOSIS — Z853 Personal history of malignant neoplasm of breast: Secondary | ICD-10-CM

## 2015-08-15 ENCOUNTER — Ambulatory Visit: Payer: Commercial Managed Care - HMO | Admitting: Hematology and Oncology

## 2015-08-15 NOTE — Assessment & Plan Note (Signed)
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 Superficial phlebitis 2. Tolerating it very well otherwise 3. Breast tenderness could be related to swimming or tamoxifen related: Recommended yoga  Pneumonia: April 2016 treated with 3 rounds of antibiotics. CT scan showed right middle and lower lobe pneumonia. I suspect that there may be a component of radiation pneumonitis in this. Doing well from this issue  Right rib pain: It is felt to be musculoskeletal in nature.  Breast cancer Surveillance: 1. Mammograms to be done feb 2016 2. Breast Exam: 08/15/15 Normal: Tenderness in the breast and chest wall on both sides including the axilla. It feels like musculoskeletal in nature no palpable abnormalities  RTC in 6 months and after that annually

## 2015-08-16 ENCOUNTER — Other Ambulatory Visit: Payer: Self-pay | Admitting: Family Medicine

## 2015-08-16 MED ORDER — LISDEXAMFETAMINE DIMESYLATE 40 MG PO CAPS: 40.0000 mg | ORAL_CAPSULE | ORAL | Status: DC

## 2015-08-16 NOTE — Telephone Encounter (Signed)
done

## 2015-08-16 NOTE — Addendum Note (Signed)
Addended by: [name] A on: 08/16/2015 05:03 PM   Modules accepted: Orders

## 2015-08-16 NOTE — Telephone Encounter (Signed)
Pt needs refill on Trazodone & script printed for Vyvanse.

## 2015-08-16 NOTE — Telephone Encounter (Signed)
Patient need a prescription of vyvanse 40 mg let her know when to pick it up.

## 2015-08-19 ENCOUNTER — Other Ambulatory Visit: Payer: Self-pay | Admitting: Family Medicine

## 2015-08-21 NOTE — Assessment & Plan Note (Signed)
Right breast DCIS ER/PR positive: Patient is currently on antiestrogen therapy with tamoxifen and is tolerating it very well without any major problems  Tamoxifen toxicities 1. Superficial phlebitis 2. Tolerating it very well otherwise 3. Breast tenderness  Pneumonia: April 2016 treated with 3 rounds of antibiotics. CT scan showed right middle and lower lobe pneumonia. I suspect that there may be a component of radiation pneumonitis in this.   Right rib pain: It is felt to be musculoskeletal in nature.  Breast cancer Surveillance: 1. Mammograms 08/12/15: Normal Density C 2. Breast Exam: 08/22/15 Normal: Tenderness in the breast and chest wall on both sides including the axilla. It feels like musculoskeletal in nature no palpable abnormalities  RTC in 6 months

## 2015-08-22 ENCOUNTER — Telehealth: Payer: Self-pay | Admitting: Hematology and Oncology

## 2015-08-22 ENCOUNTER — Encounter: Payer: Self-pay | Admitting: Hematology and Oncology

## 2015-08-22 ENCOUNTER — Ambulatory Visit (HOSPITAL_BASED_OUTPATIENT_CLINIC_OR_DEPARTMENT_OTHER): Payer: Managed Care, Other (non HMO) | Admitting: Hematology and Oncology

## 2015-08-22 VITALS — BP 118/72 | HR 72 | Temp 97.5°F | Resp 18 | Ht 66.0 in | Wt 149.3 lb

## 2015-08-22 DIAGNOSIS — C50411 Malignant neoplasm of upper-outer quadrant of right female breast: Secondary | ICD-10-CM | POA: Diagnosis not present

## 2015-08-22 NOTE — Progress Notes (Signed)
Patient Care Team: Laurey Morale, MD as PCP - General  DIAGNOSIS: Breast cancer of upper-outer quadrant of right female breast [facility])   Staging form: Breast, AJCC 7th Edition     Clinical: Stage 0 (Tis, N0, cM0) - Unsigned       Staging comments: Staged at breast conference 08/23/13      Pathologic: No stage assigned - Unsigned   SUMMARY OF ONCOLOGIC HISTORY:   Breast cancer of upper-outer quadrant of right female breast ([place])   08/15/2013 Initial Diagnosis DCIS with calcifications   08/30/2013 Surgery Right breast lumpectomy: No evidence of residual DCIS found to have atypical lobular hyperplasia   09/26/2013 - 11/09/2013 Radiation Therapy Adjuvant radiation therapy by Dr. Valere Dross   11/21/2013 -  Anti-estrogen oral therapy Tamoxifen 20 mg daily    CHIEF COMPLIANT: Follow-up on tamoxifen therapy  INTERVAL HISTORY: Kristina Pearson is a 60 year old with above-mentioned history of DCIS was been on tamoxifen therapy since June 2015. She reports to me that she feels very weak and tired all the time. She also has had problems with adjusting the dosage of her Synthroid. She is going to see an endocrinologist for this. She definitely feels significantly drying the vaginal area. She does not have any major interest and physical activity or exercise. She has gained 10 pounds in the last few months. There have been major changes in her Synthroid dosing which she attributes most of the weight gain to.  REVIEW OF SYSTEMS:   Constitutional: Denies fevers, chills or abnormal weight loss, complains of vaginal dryness and fatigue Eyes: Denies blurriness of vision Ears, nose, mouth, throat, and face: Denies mucositis or sore throat Respiratory: Denies cough, dyspnea or wheezes Cardiovascular: Denies palpitation, chest discomfort Gastrointestinal:  Denies nausea, heartburn or change in bowel habits Skin: Denies abnormal skin rashes Lymphatics: Denies new lymphadenopathy or easy bruising Neurological:Denies  numbness, tingling or new weaknesses Behavioral/Psych: Mood is stable, no new changes  Extremities: No lower extremity edema Breast:  denies any pain or lumps or nodules in either breasts All other systems were reviewed with the patient and are negative.  I have reviewed the past medical history, past surgical history, social history and family history with the patient and they are unchanged from previous note.  ALLERGIES:  has No Known Allergies.  MEDICATIONS:  Current Outpatient Prescriptions  Medication Sig Dispense Refill  . ALPRAZolam (XANAX) 0.5 MG tablet Take 1 tablet (0.5 mg total) by mouth 2 (two) times daily. (Patient not taking: Reported on 06/08/2015) 180 tablet 1  . aspirin EC 81 MG tablet Take 1 tablet (81 mg total) by mouth daily. (Patient not taking: Reported on 06/08/2015) 1 tablet 0  . Calcium Carbonate-Vitamin D (CALCIUM 600+D) 600-400 MG-UNIT per tablet Take 1 tablet by mouth 2 (two) times daily. And Vitamin K    . celecoxib (CELEBREX) 200 MG capsule Take 1 capsule (200 mg total) by mouth 2 (two) times daily. (Patient taking differently: Take 400 mg by mouth 2 (two) times daily. ) 180 capsule 3  . cycloSPORINE (RESTASIS) 0.05 % ophthalmic emulsion Place 1 drop into both eyes 2 (two) times daily.    Marland Kitchen HYDROcodone-acetaminophen (NORCO) 10-325 MG tablet Take 1 tablet by mouth every 6 (six) hours as needed for severe pain. 120 tablet 0  . KRILL OIL PO Take by mouth daily.    Marland Kitchen levothyroxine (SYNTHROID, LEVOTHROID) 125 MCG tablet Take 1 tablet (125 mcg total) by mouth daily. 90 tablet 3  . lisdexamfetamine (VYVANSE) 40  MG capsule Take 1 capsule (40 mg total) by mouth every morning. 30 capsule 0  . methocarbamol (ROBAXIN) 500 MG tablet TAKE 1 TABLET BY MOUTH EVERY 6 HOURS AS NEEDED FOR MUSCLE SPASMS 120 tablet 1  . Multiple Vitamin (MULTIVITAMIN) tablet Take 1 tablet by mouth daily.      . multivitamin-lutein (OCUVITE-LUTEIN) CAPS capsule Take 1 capsule by mouth daily. Reported  on 06/08/2015    . Probiotic Product (PROBIOTIC DAILY PO) Take by mouth.    . promethazine (PHENERGAN) 25 MG tablet Take 1 tablet (25 mg total) by mouth every 4 (four) hours as needed for nausea or vomiting. 60 tablet 2  . ranitidine (ZANTAC) 300 MG tablet TAKE 1 TABLET (300 MG TOTAL) BY MOUTH 2 (TWO) TIMES DAILY. 180 tablet 3  . tamoxifen (NOLVADEX) 20 MG tablet Take 1 tablet (20 mg total) by mouth daily. 90 tablet 3  . traZODone (DESYREL) 100 MG tablet TAKE 2 TABLETS EVERY NIGHT 60 tablet 8  . traZODone (DESYREL) 100 MG tablet TAKE 2 TABLETS AT BEDTIME 180 tablet 3   No current facility-administered medications for this visit.    PHYSICAL EXAMINATION: ECOG PERFORMANCE STATUS: 1 - Symptomatic but completely ambulatory  Filed Vitals:   08/22/15 1122  BP: 118/72  Pulse: 72  Temp: 97.5 F (36.4 C)  Resp: 18   Filed Weights   08/22/15 1122  Weight: 149 lb 4.8 oz (67.722 kg)    GENERAL:alert, no distress and comfortable SKIN: skin color, texture, turgor are normal, no rashes or significant lesions EYES: normal, Conjunctiva are pink and non-injected, sclera clear OROPHARYNX:no exudate, no erythema and lips, buccal mucosa, and tongue normal  NECK: supple, thyroid normal size, non-tender, without nodularity LYMPH:  no palpable lymphadenopathy in the cervical, axillary or inguinal LUNGS: clear to auscultation and percussion with normal breathing effort HEART: regular rate & rhythm and no murmurs and no lower extremity edema ABDOMEN:abdomen soft, non-tender and normal bowel sounds MUSCULOSKELETAL:no cyanosis of digits and no clubbing  NEURO: alert & oriented x 3 with fluent speech, no focal motor/sensory deficits EXTREMITIES: No lower extremity edema BREAST: No palpable masses or nodules in either right or left breasts. No palpable axillary supraclavicular or infraclavicular adenopathy no breast tenderness or nipple discharge. (exam performed in the presence of a  chaperone)  LABORATORY DATA:  I have reviewed the data as listed   Chemistry      Component Value Date/Time   NA 138 05/14/2015 1619   NA 139 02/21/2014 1111   K 4.8 05/14/2015 1619   K 4.4 02/21/2014 1111   CL 102 05/14/2015 1619   CO2 29 05/14/2015 1619   CO2 27 02/21/2014 1111   BUN 17 05/14/2015 1619   BUN 16.4 02/21/2014 1111   CREATININE 0.95 05/14/2015 1619   CREATININE 0.88 09/28/2014 1419   CREATININE 1.0 02/21/2014 1111      Component Value Date/Time   CALCIUM 9.5 05/14/2015 1619   CALCIUM 9.0 02/21/2014 1111   ALKPHOS 35* 09/30/2014 1216   ALKPHOS 33* 02/21/2014 1111   AST 18 09/30/2014 1216   AST 21 02/21/2014 1111   ALT 14 09/30/2014 1216   ALT 22 02/21/2014 1111   BILITOT 0.7 09/30/2014 1216   BILITOT 0.47 02/21/2014 1111       Lab Results  Component Value Date   WBC 6.6 09/30/2014   HGB 11.6* 09/30/2014   HCT 34.9* 09/30/2014   MCV 95.1 09/30/2014   PLT 205 09/30/2014   NEUTROABS 4.8 09/30/2014  ASSESSMENT & PLAN:  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 Superficial phlebitis 2. Tenderness in the pectoralis muscles 3. Lack of interest in life for activities or exercise I discussed with her that it is certainly possible that tamoxifen may be the reason for all of her symptoms. I would like her to discontinue tamoxifen therapy and see how she feels in 3-6 months. If she felt much better then she may decide to discontinue permanently taking tamoxifen therapy. I discussed the risks and benefits of tamoxifen with her in detail. The benefits are not huge. If she cannot tolerate the medication then she can discontinue it.  Pneumonia: April 2016 treated with 3 rounds of antibiotics. CT scan showed right middle and lower lobe pneumonia. I suspect that there may be a component of radiation  pneumonitis in this.   Breast cancer Surveillance: 1. Mammograms 08/12/15: Normal Density C 2. Breast Exam: 08/22/15 Normal: Tenderness in the breast and chest wall on both sides including the axilla. It feels like musculoskeletal in nature no palpable abnormalities  RTC in one year for follow-up   No orders of the defined types were placed in this encounter.   The patient has a good understanding of the overall plan. she agrees with it. she will call with any problems that may develop before the next visit here.   Rulon Eisenmenger, MD 08/22/2015

## 2015-08-22 NOTE — Telephone Encounter (Signed)
Spoke with patient to give her one year f/u appt March 2018 per 3/2 pof

## 2015-08-22 NOTE — Progress Notes (Signed)
Unable to get in to exam room prior to MD.  No assessment performed.  

## 2015-09-03 ENCOUNTER — Telehealth: Payer: Self-pay | Admitting: Obstetrics & Gynecology

## 2015-09-03 DIAGNOSIS — Z0000 Encounter for general adult medical examination without abnormal findings: Secondary | ICD-10-CM

## 2015-09-03 NOTE — Telephone Encounter (Signed)
Patient scheduled for an AEX on 10/07/15 with Dr. Sabra Heck. She also scheduled an appointment for fasting labs, her request, on 10/01/15. We will need orders for the lab appointment please.

## 2015-09-03 NOTE — Telephone Encounter (Signed)
CBC, CMP, lipid panel, TSH, and Vit D ordered as future orders. Patient had these labs performed at her last aex on 09/28/2014.  Routing to provider for final review. Patient agreeable to disposition. Will close encounter.

## 2015-09-10 ENCOUNTER — Telehealth: Payer: Self-pay | Admitting: Family Medicine

## 2015-09-10 MED ORDER — ALPRAZOLAM 0.5 MG PO TABS: 0.5000 mg | ORAL_TABLET | Freq: Two times a day (BID) | ORAL | Status: DC

## 2015-09-10 NOTE — Telephone Encounter (Signed)
Pt requesting ALPRAZolam (XANAX) 0.5 MG tablet Pt last visit 05/14/15 Pt last refill 02/28/15 #180 with one refill    Need to be fax to [facility]

## 2015-09-10 NOTE — Telephone Encounter (Signed)
Ready to fax  

## 2015-09-11 NOTE — Telephone Encounter (Signed)
Rx was fax to the pharmacy

## 2015-09-16 ENCOUNTER — Other Ambulatory Visit: Payer: Managed Care, Other (non HMO) | Admitting: *Deleted

## 2015-09-24 ENCOUNTER — Ambulatory Visit: Payer: Managed Care, Other (non HMO) | Admitting: Podiatry

## 2015-09-26 ENCOUNTER — Encounter: Payer: Self-pay | Admitting: Podiatry

## 2015-09-26 ENCOUNTER — Ambulatory Visit: Payer: Self-pay

## 2015-09-26 ENCOUNTER — Ambulatory Visit (INDEPENDENT_AMBULATORY_CARE_PROVIDER_SITE_OTHER): Payer: Managed Care, Other (non HMO) | Admitting: Podiatry

## 2015-09-26 DIAGNOSIS — S93402A Sprain of unspecified ligament of left ankle, initial encounter: Secondary | ICD-10-CM | POA: Diagnosis not present

## 2015-09-26 DIAGNOSIS — M76822 Posterior tibial tendinitis, left leg: Secondary | ICD-10-CM | POA: Diagnosis not present

## 2015-09-26 NOTE — Progress Notes (Signed)
She presents today with continued pain about her left foot and ankle associated with a fall from December 2016. She saw an urgent care doctor to perform a set of x-rays stated that nothing was broken but placed her in a Cam Walker. She states that she was in the walker for a while until she was stable enough to walk on her own. She never followed up with the doctors. She states that she still has tenderness to the anterolateral part of the ankle the medial part of her foot and ankle as well as her anterolateral leg.  Objective: Vital signs are stable she is alert and oriented 3. Pulses are strongly palpable. Neurologic sensorium is intact. No calf pain. Orthopedic evaluation does demonstrate pain on palpation of the posterior tibial tendon with some fluctuance. She also has some tenderness on palpation of the plantar fascia at the medial calcaneal insertion site. She also has some tenderness on palpation of the anterior talofibular ligament with sharp plantarflexion and inversion of the foot. The ATFL radiates pain proximally in her leg. I reviewed previous radiographs which demonstrates no osseous abnormalities. She was very reluctant to have weightbearing radiographs performed in order to evaluate the mortise. This is because of previous radiation therapy. Cutaneous evaluation demonstrates normal cutis.  Assessment ankle sprain with sprain of the anterior talofibular ligament without involvement of the calcaneofibular ligament. She has some posterior tibial tendinitis possibly associated with plantar fasciitis.  Plan: I placed her in a Tri-Lock brace and expressed that it will be at least a total of 6-8 months for severe sprain to resolve. I encouraged rest ice compression and elevation.

## 2015-10-01 ENCOUNTER — Other Ambulatory Visit: Payer: Commercial Managed Care - HMO

## 2015-10-02 ENCOUNTER — Other Ambulatory Visit (INDEPENDENT_AMBULATORY_CARE_PROVIDER_SITE_OTHER): Payer: Managed Care, Other (non HMO)

## 2015-10-02 DIAGNOSIS — Z0000 Encounter for general adult medical examination without abnormal findings: Secondary | ICD-10-CM

## 2015-10-02 LAB — T4, FREE: Free T4: 1.5 ng/dL (ref 0.8–1.8)

## 2015-10-02 LAB — COMPREHENSIVE METABOLIC PANEL
ALT: 21 U/L (ref 6–29)
AST: 23 U/L (ref 10–35)
Albumin: 4 g/dL (ref 3.6–5.1)
Alkaline Phosphatase: 38 U/L (ref 33–130)
BUN: 11 mg/dL (ref 7–25)
CALCIUM: 9.3 mg/dL (ref 8.6–10.4)
CHLORIDE: 104 mmol/L (ref 98–110)
CO2: 28 mmol/L (ref 20–31)
Creat: 0.88 mg/dL (ref 0.50–1.05)
GLUCOSE: 91 mg/dL (ref 65–99)
Potassium: 4.8 mmol/L (ref 3.5–5.3)
SODIUM: 138 mmol/L (ref 135–146)
Total Bilirubin: 0.5 mg/dL (ref 0.2–1.2)
Total Protein: 6.6 g/dL (ref 6.1–8.1)

## 2015-10-02 LAB — CBC
HEMATOCRIT: 37.5 % (ref 35.0–45.0)
Hemoglobin: 12.4 g/dL (ref 11.7–15.5)
MCH: 32.3 pg (ref 27.0–33.0)
MCHC: 33.1 g/dL (ref 32.0–36.0)
MCV: 97.7 fL (ref 80.0–100.0)
MPV: 10.5 fL (ref 7.5–12.5)
Platelets: 229 10*3/uL (ref 140–400)
RBC: 3.84 MIL/uL (ref 3.80–5.10)
RDW: 13 % (ref 11.0–15.0)
WBC: 3.6 10*3/uL — AB (ref 3.8–10.8)

## 2015-10-02 LAB — T3, FREE: T3 FREE: 2.6 pg/mL (ref 2.3–4.2)

## 2015-10-02 LAB — LIPID PANEL
Cholesterol: 183 mg/dL (ref 125–200)
HDL: 114 mg/dL (ref >=46)
LDL CALC: 60 mg/dL (ref <130)
Total CHOL/HDL Ratio: 1.6 Ratio (ref <=5.0)
Triglycerides: 46 mg/dL (ref <150)
VLDL: 9 mg/dL (ref <30)

## 2015-10-02 LAB — TSH: TSH: 0.24 mIU/L — ABNORMAL LOW

## 2015-10-03 LAB — VITAMIN D 25 HYDROXY (VIT D DEFICIENCY, FRACTURES): VIT D 25 HYDROXY: 37 ng/mL (ref 30–100)

## 2015-10-07 ENCOUNTER — Encounter: Payer: Self-pay | Admitting: Obstetrics & Gynecology

## 2015-10-07 ENCOUNTER — Ambulatory Visit (INDEPENDENT_AMBULATORY_CARE_PROVIDER_SITE_OTHER): Payer: Managed Care, Other (non HMO) | Admitting: Obstetrics & Gynecology

## 2015-10-07 VITALS — BP 100/70 | HR 82 | Resp 16 | Ht 66.0 in | Wt 150.0 lb

## 2015-10-07 DIAGNOSIS — D708 Other neutropenia: Secondary | ICD-10-CM

## 2015-10-07 DIAGNOSIS — Z124 Encounter for screening for malignant neoplasm of cervix: Secondary | ICD-10-CM | POA: Diagnosis not present

## 2015-10-07 DIAGNOSIS — Z01419 Encounter for gynecological examination (general) (routine) without abnormal findings: Secondary | ICD-10-CM

## 2015-10-07 DIAGNOSIS — Z0000 Encounter for general adult medical examination without abnormal findings: Secondary | ICD-10-CM | POA: Diagnosis not present

## 2015-10-07 LAB — POCT URINALYSIS DIPSTICK
Bilirubin, UA: NEGATIVE
GLUCOSE UA: NEGATIVE
KETONES UA: NEGATIVE
Leukocytes, UA: NEGATIVE
Nitrite, UA: NEGATIVE
Protein, UA: NEGATIVE
RBC UA: NEGATIVE
UROBILINOGEN UA: NEGATIVE
pH, UA: 5

## 2015-10-07 MED ORDER — LISDEXAMFETAMINE DIMESYLATE 30 MG PO CAPS: 30.0000 mg | ORAL_CAPSULE | ORAL | Status: DC

## 2015-10-07 MED ORDER — ZOSTER VACCINE LIVE 19400 UNT/0.65ML ~~LOC~~ SOLR: 0.6500 mL | Freq: Once | SUBCUTANEOUS | Status: DC

## 2015-10-07 NOTE — Addendum Note (Signed)
Addended by: Megan Salon on: 10/07/2015 04:34 PM   Modules accepted: Miquel Dunn
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
## 2015-10-07 NOTE — Patient Instructions (Signed)
Have a hepatitis C test done with Dr. Sarajane Jews with next blood work.

## 2015-10-09 LAB — IPS PAP TEST WITH HPV

## 2015-12-17 ENCOUNTER — Telehealth: Payer: Self-pay | Admitting: *Deleted

## 2015-12-17 NOTE — Telephone Encounter (Signed)
Call to patient to follow up with scheduling 6 month repeat CBC with diff. Patient stating that she thought she had a lab appointment scheduled for July because Dr. Sabra Heck wanted to see her in 2 months. RN stated to patient that the only appointment she had scheduled was in July of 2018 for her next AEX. RN reviewed last office visit note, and Dr. Sabra Heck wanted repeat CBC in 6 months. Patient voiced understanding. Patient stating, "I don't know if it is a good idea for me to wait 6 months to have my lab work done because I haven't been feeling well, but I have an appointment soon with my primary doctor so I will follow up then." Patient agreeable to schedule 6 month lab appointment. Appointment scheduled for 04/06/16 at 1500. Patient agreeable to date and time. Future order present for CBC with diff. Will close encounter.

## 2015-12-26 ENCOUNTER — Ambulatory Visit: Payer: Managed Care, Other (non HMO) | Admitting: Podiatry

## 2016-01-22 ENCOUNTER — Telehealth: Payer: Self-pay | Admitting: Family Medicine

## 2016-01-22 MED ORDER — LISDEXAMFETAMINE DIMESYLATE 40 MG PO CAPS: 40.0000 mg | ORAL_CAPSULE | ORAL | 0 refills | Status: DC

## 2016-01-22 NOTE — Telephone Encounter (Signed)
° °  Pt req refill   Pt said she is on 40mg  of the below med   lisdexamfetamine (VYVANSE) 30 MG capsule

## 2016-01-22 NOTE — Telephone Encounter (Signed)
Script is ready for pick up here at front office and I spoke with pt.  

## 2016-01-22 NOTE — Telephone Encounter (Signed)
done

## 2016-02-07 ENCOUNTER — Other Ambulatory Visit: Payer: Self-pay

## 2016-02-07 DIAGNOSIS — C50411 Malignant neoplasm of upper-outer quadrant of right female breast: Secondary | ICD-10-CM

## 2016-02-07 MED ORDER — TAMOXIFEN CITRATE 20 MG PO TABS: 20.0000 mg | ORAL_TABLET | Freq: Every day | ORAL | 3 refills | Status: DC

## 2016-02-14 ENCOUNTER — Telehealth: Payer: Self-pay | Admitting: Family Medicine

## 2016-02-14 NOTE — Telephone Encounter (Signed)
Refill request for Alprazolam 0.5 mg and a 90 day supply to [facility].

## 2016-02-18 MED ORDER — ALPRAZOLAM 0.5 MG PO TABS: 0.5000 mg | ORAL_TABLET | Freq: Two times a day (BID) | ORAL | 1 refills | Status: DC

## 2016-02-18 NOTE — Telephone Encounter (Signed)
Ready to fax  

## 2016-02-18 NOTE — Telephone Encounter (Signed)
Done

## 2016-03-06 ENCOUNTER — Ambulatory Visit (INDEPENDENT_AMBULATORY_CARE_PROVIDER_SITE_OTHER): Payer: Managed Care, Other (non HMO) | Admitting: Family Medicine

## 2016-03-06 ENCOUNTER — Encounter: Payer: Self-pay | Admitting: Family Medicine

## 2016-03-06 VITALS — BP 102/74 | HR 80 | Temp 98.3°F | Ht 66.0 in | Wt 148.0 lb

## 2016-03-06 DIAGNOSIS — R51 Headache: Secondary | ICD-10-CM | POA: Diagnosis not present

## 2016-03-06 DIAGNOSIS — R519 Headache, unspecified: Secondary | ICD-10-CM

## 2016-03-06 MED ORDER — GABAPENTIN 100 MG PO CAPS: 100.0000 mg | ORAL_CAPSULE | Freq: Three times a day (TID) | ORAL | 3 refills | Status: DC

## 2016-03-06 NOTE — Progress Notes (Signed)
Pre visit review using our clinic review tool, if applicable. No additional management support is needed unless otherwise documented below in the visit note. 

## 2016-03-06 NOTE — Progress Notes (Signed)
   Subjective:    Patient ID: Kristina Pearson, female    DOB: 14-Feb-1956, 60 y.o.   MRN: [id number]  HPI Here for a 2 month history of right sided headaches. These are dull in nature, not severe, and they come and go. They involve the right parietal scalp, the right temple, and the right cheek areas. Her skin feels extra sensitive to touch in these areas. No vision changes. No nausea or photophobia. She has some posterior neck pain as well, but his has been chronic for the past 5 years. She has been diagnosed with cervical spine stenosis by Dr. Vertell Limber, but this headache is different than her neck pain headaches. She was referred to [facility] and she was evaluated there by Marita Kansas PA. She had labs there including ESR that were all normal, so temporal arteritis was ruled out. She has tried Celebrex, Advil, and aspirin with no benefit.    Review of Systems  Constitutional: Negative.   Eyes: Negative.   Respiratory: Negative.   Cardiovascular: Negative.   Neurological: Positive for headaches. Negative for dizziness, tremors, seizures, syncope, facial asymmetry, speech difficulty, weakness, light-headedness and numbness.       Objective:   Physical Exam  Constitutional: She is oriented to person, place, and time. She appears well-developed and well-nourished. No distress.  No photophobia   HENT:  Head: Normocephalic and atraumatic.  Right Ear: External ear normal.  Left Ear: External ear normal.  Nose: Nose normal.  Mouth/Throat: Oropharynx is clear and moist.  Eyes: Conjunctivae and EOM are normal. Pupils are equal, round, and reactive to light.  Neck: Normal range of motion. Neck supple. No thyromegaly present.  Cardiovascular: Normal rate, regular rhythm, normal heart sounds and intact distal pulses.   Pulmonary/Chest: Effort normal and breath sounds normal.  Lymphadenopathy:    She has no cervical adenopathy.  Neurological: She is alert and oriented to person, place,  and time. She has normal reflexes. No cranial nerve deficit. She exhibits normal muscle tone. Coordination normal.  Skin:  She is tender throughout the right parietal scalp area and the right temple. No rash is seen. No TMJ tenderness           Assessment & Plan:  Trigeminal neuralgia. We discussed possible pharmacologic treatments like Prednisone or Gabapentin, but she would like to try acupuncture first. We will put in a referral to see an acupuncture therapist she has seen before, Rob Balkind. Recheck prn.

## 2016-03-11 ENCOUNTER — Telehealth: Payer: Self-pay | Admitting: Family Medicine

## 2016-03-11 NOTE — Addendum Note (Signed)
Addended by: [name] A on: 03/11/2016 08:44 AM   Modules accepted: Orders

## 2016-03-11 NOTE — Telephone Encounter (Signed)
I left a voice message for pt, Dr. Sarajane Jews put in a referral stat referral to Neurology, he spoke with Dr. Jerline Pain from [facility] this morning.

## 2016-03-18 ENCOUNTER — Encounter: Payer: Self-pay | Admitting: Neurology

## 2016-03-18 ENCOUNTER — Ambulatory Visit (INDEPENDENT_AMBULATORY_CARE_PROVIDER_SITE_OTHER): Payer: Managed Care, Other (non HMO) | Admitting: Neurology

## 2016-03-18 VITALS — BP 110/80 | HR 82 | Ht 66.0 in | Wt 146.4 lb

## 2016-03-18 DIAGNOSIS — R51 Headache: Secondary | ICD-10-CM | POA: Diagnosis not present

## 2016-03-18 DIAGNOSIS — Z853 Personal history of malignant neoplasm of breast: Secondary | ICD-10-CM | POA: Diagnosis not present

## 2016-03-18 DIAGNOSIS — R208 Other disturbances of skin sensation: Secondary | ICD-10-CM

## 2016-03-18 DIAGNOSIS — R519 Headache, unspecified: Secondary | ICD-10-CM

## 2016-03-18 MED ORDER — NORTRIPTYLINE HCL 10 MG PO CAPS: ORAL_CAPSULE | ORAL | 5 refills | Status: DC

## 2016-03-18 NOTE — Patient Instructions (Addendum)
1.  MRI brain without contrast 2.  MRA head  3.  US carotids 4.  Start nortriptyline 10mg  at bedtime for 2 week, then increase to 2 tablet at bedtime 5.  Call with an update in 6 weeks  Return to clinic in 4 months

## 2016-03-18 NOTE — Progress Notes (Signed)
[facility] Clinic Note - Initial Visit   Date: 03/18/16  Kristina Pearson MRN: [id number] DOB: 08-27-1955   Dear Dr. Sarajane Jews:  Thank you for your kind referral of Kristina Pearson for consultation of headaches. Although her history is well known to you, please allow [place] to reiterate it for the purpose of our medical record. The patient was accompanied to the clinic by self.    History of Present Illness: Kristina Pearson is a 59 y.o. right-handed Caucasian female with anxiety, hypothyroidism, GERD, history of right breast cancer s/p lumpectomy and radiation, cervical canal stenosis presenting for evaluation of right sided headaches.    Starting in July 2017, she began having dull and pressure pain over the right side of her, which has been constant since onset. She has increased right scalp tenderness and hypersensitivity as if she has a sunburn.  She complains of right vision changes, which is described as blurry vision.  There is no double vision.  She saw her eye doctor whose exam did not show any abnormalities of the optic nerve.  There was a change in her visual acuity and new lenses were prescribed.   Headaches are worse in the morning and occasionally wake her up from sleeping.  Stress makes the pain worse.  No nausea, vomiting.  She endorses photophobia.  She used to get migraines before menopause (age 58), but afterwards, headaches improved.  She has been evaluated by Rheumatology whose evaluation was negative for temporal arteritis.  She also sees Dr. Vertell Limber for spinal stenosis who did not feel headaches were related to this.  Out-side paper records, electronic medical record, and images have been reviewed where available and summarized as:  Lab Results  Component Value Date   ESRSEDRATE 7 09/28/2014   Lab Results  Component Value Date   TSH 0.24 (L) 10/02/2015     Past Medical History:  Diagnosis Date  . ADHD (attention deficit hyperactivity disorder)    ADD    . Allergy   . Arthritis    osteoarthritrs  . Breast cancer ([place]) 08/15/13   right lateral upper outer  . Fibromyalgia   . GERD (gastroesophageal reflux disease)   . Headache(784.0)   . History of depression   . Hx of radiation therapy 10/19/13- 11/09/13   right breast 4256 cGy in 16 sessions, hypo-fractionated  . Hypothyroid   . Migraine   . Osteopenia   . PONV (postoperative nausea and vomiting)   . Sialoadenitis   . Spinal stenosis   . Urinary incontinence   . Vitiligo   . Wears glasses     Past Surgical History:  Procedure Laterality Date  . BREAST LUMPECTOMY WITH RADIOACTIVE SEED LOCALIZATION Right 08/30/2013   Procedure: RIGHT PARTIAL MASTECTOMY WITH RADIOACTIVE SEED LOCALIZATION;  Surgeon: [name], MD;  Location: [facility];  Service: General;  Laterality: Right;  . BREAST SURGERY    . CHOLECYSTECTOMY  1996   lap  . COLONOSCOPY  1998   per Dr. Earlean Shawl, clear, repeat in 10 yrs  . DIAGNOSTIC LAPAROSCOPY  1982   exp  . LAPAROSCOPIC OVARIAN CYSTECTOMY    . URETHRAL DILATION       Medications:  Outpatient Encounter Prescriptions as of 03/18/2016  Medication Sig Note  . ALPRAZolam (XANAX) 0.5 MG tablet Take 1 tablet (0.5 mg total) by mouth 2 (two) times daily.   Marland Kitchen aspirin EC 81 MG tablet Take 1 tablet (81 mg total) by mouth daily.   Marland Kitchen  BIOTIN PO Take by mouth.   . Calcium Carbonate-Vitamin D (CALCIUM 600+D) 600-400 MG-UNIT per tablet Take 1 tablet by mouth 2 (two) times daily. And Vitamin K   . celecoxib (CELEBREX) 200 MG capsule Take 1 capsule (200 mg total) by mouth 2 (two) times daily. (Patient taking differently: Take 400 mg by mouth 2 (two) times daily. )   . Cyanocobalamin (VITAMIN B12 PO) Take by mouth.   . cycloSPORINE (RESTASIS) 0.05 % ophthalmic emulsion Place 1 drop into both eyes 2 (two) times daily.   Marland Kitchen gabapentin (NEURONTIN) 100 MG capsule Take 1 capsule (100 mg total) by mouth 3 (three) times daily.   Marland Kitchen HYDROcodone-acetaminophen  (NORCO) 10-325 MG tablet Take 1 tablet by mouth every 6 (six) hours as needed for severe pain.   Marland Kitchen KRILL OIL PO Take by mouth daily.   Marland Kitchen lisdexamfetamine (VYVANSE) 40 MG capsule Take 1 capsule (40 mg total) by mouth every morning.   . methocarbamol (ROBAXIN) 500 MG tablet TAKE 1 TABLET BY MOUTH EVERY 6 HOURS AS NEEDED FOR MUSCLE SPASMS   . Multiple Vitamin (MULTIVITAMIN) tablet Take 1 tablet by mouth daily.     . multivitamin-lutein (OCUVITE-LUTEIN) CAPS capsule Take 1 capsule by mouth daily. Reported on 06/08/2015   . Probiotic Product (PROBIOTIC DAILY PO) Take by mouth.   . ranitidine (ZANTAC) 300 MG tablet TAKE 1 TABLET (300 MG TOTAL) BY MOUTH 2 (TWO) TIMES DAILY. (Patient taking differently: TAKE 1 TABLET (300 MG TOTAL) BY MOUTH 2 (TWO) TIMES DAILY. prn)   . SYNTHROID 112 MCG tablet  03/18/2016: Received from: External Pharmacy  . tamoxifen (NOLVADEX) 20 MG tablet Take 1 tablet (20 mg total) by mouth daily.   . traZODone (DESYREL) 100 MG tablet TAKE 2 TABLETS EVERY NIGHT   . nortriptyline (PAMELOR) 10 MG capsule Take nortriptyline '10mg'$  at bedtime for 2 week, then increase to 2 tablet at bedtime   . [DISCONTINUED] levothyroxine (SYNTHROID, LEVOTHROID) 125 MCG tablet Take 1 tablet (125 mcg total) by mouth daily.    No facility-administered encounter medications on file as of 03/18/2016.      Allergies: No Known Allergies  Family History: Family History  Problem Relation Age of Onset  . Arthritis Mother   . Diabetes Mother   . Osteoporosis Mother   . Polymyalgia rheumatica Mother     on prednisone  . Heart disease Mother   . Hyperlipidemia Mother   . Hypertension Mother   . Stroke Mother   . Heart attack Father   . Heart disease Father   . Colon cancer Maternal Uncle     dx in his 70s  . Colon cancer Cousin 45    mother's maternal first cousin  . Breast cancer Cousin 65    BRCA negative; father's maternal first cousin  . Leukemia Maternal Uncle 81    AML  . Melanoma Maternal  Uncle 73  . Leukemia Maternal Uncle 93  . Ovarian cancer Cousin 101    maternal cousin  . Ovarian cancer Cousin 24    maternal cousin  . Leukemia Cousin     dx in his 66s; maternal cousin; thought to be the result of taking [place]  . Heart attack Paternal Uncle   . Colon cancer Cousin 46    maternal second cousin  . Breast cancer Other     maternal great aunt dx in her 19s  . Ovarian cancer Other     maternal grandfather's sister dx in her 7s  . Colon cancer  Other     maternal grandfather's sister dx in her 76s  . Colon cancer Other     maternal grandfather's sister dx in her 76s  . Other Daughter     dermoid tumor  . Diabetes Paternal Grandmother   . Skin cancer Paternal Grandmother   . Hypertension Paternal Grandmother     Social History: Social History  Substance Use Topics  . Smoking status: Former Smoker    Quit date: 09/14/1978  . Smokeless tobacco: Never Used     Comment: short time in college  . Alcohol use 0.6 - 1.2 oz/week    1 - 2 Standard drinks or equivalent per week   Social History   Social History Narrative   Lives with husband, daughter and mother in a 2 story home.  Has one child.  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  Education: college degree    Review of Systems:  CONSTITUTIONAL: No fevers, chills, night sweats, or weight loss.   EYES: +visual changes or eye pain ENT: No hearing changes.  No history of nose bleeds.   RESPIRATORY: No cough, wheezing and shortness of breath.   CARDIOVASCULAR: Negative for chest pain, and palpitations.   GI: Negative for abdominal discomfort, blood in stools or black stools.  No recent change in bowel habits.   GU:  No history of incontinence.   MUSCLOSKELETAL: No history of joint pain or swelling.  No myalgias.   SKIN: Negative for lesions, rash, and itching.   HEMATOLOGY/ONCOLOGY: Negative for prolonged bleeding, bruising easily, and swollen nodes.  No history of cancer.   ENDOCRINE: Negative for  cold or heat intolerance, polydipsia or goiter.   PSYCH:  No depression +anxiety symptoms.   NEURO: As Above.   Vital Signs:  BP 110/80   Pulse 82   Ht '5\' 6"'$  (2.229 m)   Wt 146 lb 6 oz (66.4 kg)   LMP 09/21/2006   SpO2 97%   BMI 23.63 kg/m    General Medical Exam:   General:  Well appearing, comfortable.   Eyes/ENT: see cranial nerve examination.   Neck: No masses appreciated.  Full range of motion without tenderness.  No carotid, opthalmic, and temporal bruits. There is no tenderness or ropiness of the temporal arteries Respiratory:  Clear to auscultation, good air entry bilaterally.   Cardiac:  Regular rate and rhythm, no murmur.   Extremities:  No deformities, edema, or skin discoloration.  Skin:  No rashes or lesions.  Neurological Exam: MENTAL STATUS including orientation to time, place, person, recent and remote memory, attention span and concentration, language, and fund of knowledge is normal.  Speech is not dysarthric.  CRANIAL NERVES: II:  No visual field defects.  Unremarkable fundi.   III-IV-VI: Pupils equal round and reactive to light.  Normal conjugate, extra-ocular eye movements in all directions of gaze.  No nystagmus.  No ptosis.   V:  Normal facial sensation.     VII:  Normal facial symmetry and movements.  No pathologic facial reflexes.  VIII:  Normal hearing and vestibular function.   IX-X:  Normal palatal movement.   XI:  Normal shoulder shrug and head rotation.   XII:  Normal tongue strength and range of motion, no deviation or fasciculation.  MOTOR:  No atrophy, fasciculations or abnormal movements.  No pronator drift.  Tone is normal.    Right Upper Extremity:    Left Upper Extremity:    Deltoid  5/5   Deltoid  5/5   Biceps  5/5   Biceps  5/5   Triceps  5/5   Triceps  5/5   Wrist extensors  5/5   Wrist extensors  5/5   Wrist flexors  5/5   Wrist flexors  5/5   Finger extensors  5/5   Finger extensors  5/5   Finger flexors  5/5   Finger flexors   5/5   Dorsal interossei  5/5   Dorsal interossei  5/5   Abductor pollicis  5/5   Abductor pollicis  5/5   Tone (Ashworth scale)  0  Tone (Ashworth scale)  0   Right Lower Extremity:    Left Lower Extremity:    Hip flexors  5/5   Hip flexors  5/5   Hip extensors  5/5   Hip extensors  5/5   Knee flexors  5/5   Knee flexors  5/5   Knee extensors  5/5   Knee extensors  5/5   Dorsiflexors  5/5   Dorsiflexors  5/5   Plantarflexors  5/5   Plantarflexors  5/5   Toe extensors  5/5   Toe extensors  5/5   Toe flexors  5/5   Toe flexors  5/5   Tone (Ashworth scale)  0  Tone (Ashworth scale)  0   MSRs:  Right                                                                 Left brachioradialis 2+  brachioradialis 2+  biceps 2+  biceps 2+  triceps 2+  triceps 2+  patellar 2+  patellar 2+  ankle jerk 2+  ankle jerk 2+  Hoffman no  Hoffman no  plantar response down  plantar response down   SENSORY:  Normal and symmetric perception of light touch, pinprick, vibration, and proprioception.  Romberg's sign absent.   COORDINATION/GAIT: Normal finger-to- nose-finger and heel-to-shin.  Intact rapid alternating movements bilaterally.  Able to rise from a chair without using arms.  Gait narrow based and stable. Tandem and stressed gait intact.    IMPRESSION: Mrs. Monical is a 59 year-old female referred for evaluation of right sided headaches.  She has associated allodynia and right sided vision changes.  There are no worrisome features on exam, however new onset headaches in patient > 60 and headaches that wake her up from sleeping certainly needs further evaluation.  She will undergo intracranial imaging as well as vessel imaging to exclude structural process such as tumor or aneurysm.  In the meantime, I will start her on nortriptyline in attempt to treat this as chronic migraine.    PLAN/RECOMMENDATIONS:  1.  MRI brain without contrast 2.  MRA head  3.  US carotids 4.  Start nortriptyline '10mg'$  at  bedtime for 2 week, then increase to 2 tablet at bedtime 5.  Call with an update in 6 weeks  Return to clinic in 4 months   The duration of this appointment visit was 60 minutes of face-to-face time with the patient.  Greater than 50% of this time was spent in counseling, explanation of diagnosis, planning of further management, and coordination of care.   Thank you for allowing me to participate in patient's care.  If I can answer any additional questions,  I would be pleased to do so.    Sincerely,    Donika K. Posey Pronto, DO

## 2016-03-18 NOTE — Progress Notes (Signed)
Error

## 2016-03-25 ENCOUNTER — Inpatient Hospital Stay: Admission: RE | Admit: 2016-03-25 | Payer: Managed Care, Other (non HMO) | Source: Ambulatory Visit

## 2016-03-25 ENCOUNTER — Other Ambulatory Visit: Payer: Managed Care, Other (non HMO)

## 2016-03-28 ENCOUNTER — Ambulatory Visit
Admission: RE | Admit: 2016-03-28 | Discharge: 2016-03-28 | Disposition: A | Discharge status: Home / Self Care | Payer: Managed Care, Other (non HMO) | Source: Ambulatory Visit | Attending: Neurology | Admitting: Neurology

## 2016-03-28 DIAGNOSIS — R51 Headache: Principal | ICD-10-CM

## 2016-03-28 DIAGNOSIS — R208 Other disturbances of skin sensation: Secondary | ICD-10-CM

## 2016-03-28 DIAGNOSIS — Z853 Personal history of malignant neoplasm of breast: Secondary | ICD-10-CM

## 2016-03-28 DIAGNOSIS — R519 Headache, unspecified: Secondary | ICD-10-CM

## 2016-03-30 ENCOUNTER — Telehealth: Payer: Self-pay | Admitting: Neurology

## 2016-03-30 NOTE — Telephone Encounter (Signed)
Patient made aware of results.  

## 2016-03-30 NOTE — Telephone Encounter (Signed)
-----   Message from Alda Berthold, DO sent at 03/30/2016 10:34 AM EDT ----- Please inform patient that her MRI/A does not show anything worrisome such as tumor, stroke, aneurysm, or bleed. There is mild age-related changes.  Thanks.

## 2016-04-02 ENCOUNTER — Other Ambulatory Visit: Payer: Managed Care, Other (non HMO)

## 2016-04-02 ENCOUNTER — Encounter: Payer: Self-pay | Admitting: Neurology

## 2016-04-06 ENCOUNTER — Other Ambulatory Visit: Payer: Self-pay

## 2016-04-06 ENCOUNTER — Telehealth: Payer: Self-pay | Admitting: Obstetrics & Gynecology

## 2016-04-06 NOTE — Telephone Encounter (Signed)
Patient canceled her 6 month CBC and diff lab appointment today. Patient is sick and will call later to reschedule.

## 2016-04-09 ENCOUNTER — Ambulatory Visit (HOSPITAL_COMMUNITY)
Admission: RE | Admit: 2016-04-09 | Discharge: 2016-04-09 | Disposition: A | Discharge status: Home / Self Care | Payer: Managed Care, Other (non HMO) | Source: Ambulatory Visit | Attending: Cardiovascular Disease | Admitting: Cardiovascular Disease

## 2016-04-09 DIAGNOSIS — Z853 Personal history of malignant neoplasm of breast: Secondary | ICD-10-CM | POA: Diagnosis present

## 2016-04-09 DIAGNOSIS — R208 Other disturbances of skin sensation: Secondary | ICD-10-CM | POA: Diagnosis present

## 2016-04-09 DIAGNOSIS — I6523 Occlusion and stenosis of bilateral carotid arteries: Secondary | ICD-10-CM | POA: Insufficient documentation

## 2016-04-09 DIAGNOSIS — R519 Headache, unspecified: Secondary | ICD-10-CM

## 2016-04-09 DIAGNOSIS — R51 Headache: Secondary | ICD-10-CM | POA: Diagnosis not present

## 2016-04-10 ENCOUNTER — Telehealth: Payer: Self-pay | Admitting: Neurology

## 2016-04-10 NOTE — Telephone Encounter (Signed)
Patient made aware.

## 2016-04-10 NOTE — Telephone Encounter (Signed)
-----   Message from [place], DO sent at 04/10/2016  4:12 PM EDT ----- Let pt know that carotid u/s is normal

## 2016-04-13 ENCOUNTER — Other Ambulatory Visit (INDEPENDENT_AMBULATORY_CARE_PROVIDER_SITE_OTHER): Payer: Managed Care, Other (non HMO)

## 2016-04-13 ENCOUNTER — Telehealth: Payer: Self-pay | Admitting: Obstetrics & Gynecology

## 2016-04-13 ENCOUNTER — Other Ambulatory Visit: Payer: Self-pay | Admitting: Obstetrics & Gynecology

## 2016-04-13 DIAGNOSIS — R5383 Other fatigue: Secondary | ICD-10-CM

## 2016-04-13 DIAGNOSIS — D708 Other neutropenia: Secondary | ICD-10-CM

## 2016-04-13 LAB — CBC WITH DIFFERENTIAL/PLATELET
BASOS ABS: 49 {cells}/uL (ref 0–200)
Basophils Relative: 1 %
EOS ABS: 294 {cells}/uL (ref 15–500)
EOS PCT: 6 %
HCT: 37.9 % (ref 35.0–45.0)
HEMOGLOBIN: 12.5 g/dL (ref 11.7–15.5)
Lymphocytes Relative: 32 %
Lymphs Abs: 1568 cells/uL (ref 850–3900)
MCH: 32.1 pg (ref 27.0–33.0)
MCHC: 33 g/dL (ref 32.0–36.0)
MCV: 97.2 fL (ref 80.0–100.0)
MONOS PCT: 7 %
MPV: 10.5 fL (ref 7.5–12.5)
Monocytes Absolute: 343 cells/uL (ref 200–950)
NEUTROS ABS: 2646 {cells}/uL (ref 1500–7800)
NEUTROS PCT: 54 %
PLATELETS: 224 10*3/uL (ref 140–400)
RBC: 3.9 MIL/uL (ref 3.80–5.10)
RDW: 12.9 % (ref 11.0–15.0)
WBC: 4.9 10*3/uL (ref 3.8–10.8)

## 2016-04-13 NOTE — Telephone Encounter (Signed)
Dr. Sabra Heck, Please see patient message below and advise?    Last TSH, T3,T4 drawn 10/02/15.

## 2016-04-13 NOTE — Telephone Encounter (Signed)
Patient coming in for CBC labs this afternoon and wants to know if dr Sabra Heck will do a full thyroid panel, states she is very fatigued

## 2016-04-14 LAB — THYROID PANEL WITH TSH
Free Thyroxine Index: 2 (ref 1.4–3.8)
T3 Uptake: 34 % (ref 22–35)
T4, Total: 5.9 ug/dL (ref 4.5–12.0)
TSH: 1.23 m[IU]/L

## 2016-04-14 NOTE — Telephone Encounter (Signed)
OK to close encounter. 

## 2016-04-14 NOTE — Telephone Encounter (Signed)
Dr. Sabra Heck, ok to close encounter, requested labs were added?

## 2016-04-16 ENCOUNTER — Encounter: Payer: Self-pay | Admitting: Neurology

## 2016-04-24 ENCOUNTER — Telehealth: Payer: Self-pay | Admitting: Family Medicine

## 2016-04-24 NOTE — Telephone Encounter (Signed)
Pt needs new rx vyvanse

## 2016-04-27 MED ORDER — LISDEXAMFETAMINE DIMESYLATE 40 MG PO CAPS: 40.0000 mg | ORAL_CAPSULE | ORAL | 0 refills | Status: DC

## 2016-04-27 NOTE — Telephone Encounter (Signed)
done

## 2016-04-27 NOTE — Telephone Encounter (Signed)
Scripts are ready for pick up here at front office and I spoke with pt. 

## 2016-04-29 NOTE — Progress Notes (Signed)
error 

## 2016-05-13 ENCOUNTER — Other Ambulatory Visit (INDEPENDENT_AMBULATORY_CARE_PROVIDER_SITE_OTHER): Payer: Managed Care, Other (non HMO)

## 2016-05-13 DIAGNOSIS — Z0000 Encounter for general adult medical examination without abnormal findings: Secondary | ICD-10-CM

## 2016-05-13 LAB — CBC WITH DIFFERENTIAL/PLATELET
Basophils Absolute: 0 10*3/uL (ref 0.0–0.1)
Basophils Relative: 0.4 % (ref 0.0–3.0)
EOS PCT: 2.8 % (ref 0.0–5.0)
Eosinophils Absolute: 0.1 10*3/uL (ref 0.0–0.7)
HCT: 37.9 % (ref 36.0–46.0)
Hemoglobin: 12.9 g/dL (ref 12.0–15.0)
LYMPHS ABS: 1.4 10*3/uL (ref 0.7–4.0)
Lymphocytes Relative: 27.1 % (ref 12.0–46.0)
MCHC: 34.2 g/dL (ref 30.0–36.0)
MCV: 95.4 fl (ref 78.0–100.0)
MONOS PCT: 7 % (ref 3.0–12.0)
Monocytes Absolute: 0.4 10*3/uL (ref 0.1–1.0)
NEUTROS PCT: 62.7 % (ref 43.0–77.0)
Neutro Abs: 3.2 10*3/uL (ref 1.4–7.7)
Platelets: 204 10*3/uL (ref 150.0–400.0)
RBC: 3.97 Mil/uL (ref 3.87–5.11)
RDW: 12.5 % (ref 11.5–15.5)
WBC: 5.2 10*3/uL (ref 4.0–10.5)

## 2016-05-13 LAB — BASIC METABOLIC PANEL
BUN: 12 mg/dL (ref 6–23)
CO2: 28 meq/L (ref 19–32)
Calcium: 9.2 mg/dL (ref 8.4–10.5)
Chloride: 103 mEq/L (ref 96–112)
Creatinine, Ser: 0.99 mg/dL (ref 0.40–1.20)
GFR: 60.69 mL/min (ref >60.00)
GLUCOSE: 71 mg/dL (ref 70–99)
POTASSIUM: 4 meq/L (ref 3.5–5.1)
SODIUM: 139 meq/L (ref 135–145)

## 2016-05-13 LAB — POC URINALSYSI DIPSTICK (AUTOMATED)
Bilirubin, UA: NEGATIVE
Blood, UA: NEGATIVE
Glucose, UA: NEGATIVE
LEUKOCYTES UA: NEGATIVE
NITRITE UA: NEGATIVE
PH UA: 6
PROTEIN UA: NEGATIVE
Spec Grav, UA: 1.02
UROBILINOGEN UA: 0.2

## 2016-05-13 LAB — HEPATIC FUNCTION PANEL
ALBUMIN: 4.1 g/dL (ref 3.5–5.2)
ALT: 14 U/L (ref 0–35)
AST: 21 U/L (ref 0–37)
Alkaline Phosphatase: 30 U/L — ABNORMAL LOW (ref 39–117)
Bilirubin, Direct: 0.1 mg/dL (ref 0.0–0.3)
TOTAL PROTEIN: 6.7 g/dL (ref 6.0–8.3)
Total Bilirubin: 0.5 mg/dL (ref 0.2–1.2)

## 2016-05-13 LAB — LIPID PANEL
CHOLESTEROL: 157 mg/dL (ref 0–200)
HDL: 90 mg/dL (ref >39.00)
LDL Cholesterol: 58 mg/dL (ref 0–99)
NonHDL: 67.36
Total CHOL/HDL Ratio: 2
Triglycerides: 45 mg/dL (ref 0.0–149.0)
VLDL: 9 mg/dL (ref 0.0–40.0)

## 2016-05-13 LAB — TSH: TSH: 0.57 u[IU]/mL (ref 0.35–4.50)

## 2016-05-18 ENCOUNTER — Encounter: Payer: Managed Care, Other (non HMO) | Admitting: Family Medicine

## 2016-05-22 ENCOUNTER — Ambulatory Visit (INDEPENDENT_AMBULATORY_CARE_PROVIDER_SITE_OTHER): Payer: Managed Care, Other (non HMO) | Admitting: Family Medicine

## 2016-05-22 ENCOUNTER — Telehealth: Payer: Self-pay | Admitting: Family Medicine

## 2016-05-22 ENCOUNTER — Encounter: Payer: Self-pay | Admitting: Family Medicine

## 2016-05-22 VITALS — BP 96/64 | HR 66 | Temp 98.2°F | Ht 66.75 in | Wt 146.6 lb

## 2016-05-22 DIAGNOSIS — Z209 Contact with and (suspected) exposure to unspecified communicable disease: Secondary | ICD-10-CM

## 2016-05-22 DIAGNOSIS — Z0000 Encounter for general adult medical examination without abnormal findings: Secondary | ICD-10-CM

## 2016-05-22 MED ORDER — TRAZODONE HCL 100 MG PO TABS: ORAL_TABLET | ORAL | 3 refills | Status: DC

## 2016-05-22 MED ORDER — RANITIDINE HCL 300 MG PO TABS: ORAL_TABLET | ORAL | 3 refills | Status: DC

## 2016-05-22 NOTE — Progress Notes (Signed)
Pre visit review using our clinic review tool, if applicable. No additional management support is needed unless otherwise documented below in the visit note. 

## 2016-05-22 NOTE — Progress Notes (Signed)
   Subjective:    Patient ID: Kristina Pearson, female    DOB: 05/25/1956, 60 y.o.   MRN: [id number]  HPI 60 yr old female for a well exam. She feels fine except for her chronic headaches. She is seeing Dr. Gwendel Hanson at [facility] for this, and she has started her on daily Indomethacin. She will also be getting an occipital nerve block sometime soon.    Review of Systems  Constitutional: Negative.   HENT: Negative.   Eyes: Negative.   Respiratory: Negative.   Cardiovascular: Negative.   Gastrointestinal: Negative.   Genitourinary: Negative for decreased urine volume, difficulty urinating, dyspareunia, dysuria, enuresis, flank pain, frequency, hematuria, pelvic pain and urgency.  Musculoskeletal: Negative.   Skin: Negative.   Neurological: Positive for headaches. Negative for dizziness, tremors, seizures, syncope, facial asymmetry, speech difficulty, weakness, light-headedness and numbness.  Psychiatric/Behavioral: Negative.        Objective:   Physical Exam  Constitutional: She is oriented to person, place, and time. She appears well-developed and well-nourished. No distress.  HENT:  Head: Normocephalic and atraumatic.  Right Ear: External ear normal.  Left Ear: External ear normal.  Nose: Nose normal.  Mouth/Throat: Oropharynx is clear and moist. No oropharyngeal exudate.  Eyes: Conjunctivae and EOM are normal. Pupils are equal, round, and reactive to light. No scleral icterus.  Neck: Normal range of motion. Neck supple. No JVD present. No thyromegaly present.  Cardiovascular: Normal rate, regular rhythm, normal heart sounds and intact distal pulses.  Exam reveals no gallop and no friction rub.   No murmur heard. Pulmonary/Chest: Effort normal and breath sounds normal. No respiratory distress. She has no wheezes. She has no rales. She exhibits no tenderness.  Abdominal: Soft. Bowel sounds are normal. She exhibits no distension and no mass. There is no tenderness. There is no  rebound and no guarding.  Musculoskeletal: Normal range of motion. She exhibits no edema or tenderness.  Lymphadenopathy:    She has no cervical adenopathy.  Neurological: She is alert and oriented to person, place, and time. She has normal reflexes. No cranial nerve deficit. She exhibits normal muscle tone. Coordination normal.  Skin: Skin is warm and dry. No rash noted. No erythema.  Psychiatric: She has a normal mood and affect. Her behavior is normal. Judgment and thought content normal.          Assessment & Plan:  Well exam. We discussed diet and exercise. She will follow up with Dr. Rock Nephew as above. Laurey Morale, MD

## 2016-05-22 NOTE — Telephone Encounter (Signed)
Pt has a question did not want to elaborate on it.

## 2016-05-23 LAB — HEPATITIS C ANTIBODY: HCV Ab: NEGATIVE

## 2016-05-26 NOTE — Telephone Encounter (Signed)
I spoke with pt, she will contact GYN to ask about ordering her bone density scan.

## 2016-06-02 ENCOUNTER — Encounter: Payer: Self-pay | Admitting: Family Medicine

## 2016-06-02 ENCOUNTER — Ambulatory Visit (INDEPENDENT_AMBULATORY_CARE_PROVIDER_SITE_OTHER): Payer: Managed Care, Other (non HMO) | Admitting: Family Medicine

## 2016-06-02 VITALS — BP 113/79 | HR 64 | Temp 98.5°F | Ht 66.75 in | Wt 150.0 lb

## 2016-06-02 DIAGNOSIS — N390 Urinary tract infection, site not specified: Secondary | ICD-10-CM

## 2016-06-02 LAB — POC URINALSYSI DIPSTICK (AUTOMATED)
Bilirubin, UA: NEGATIVE
Glucose, UA: NEGATIVE
Ketones, UA: NEGATIVE
NITRITE UA: NEGATIVE
PH UA: 7.5
Protein, UA: NEGATIVE
RBC UA: NEGATIVE
Spec Grav, UA: 1.01
UROBILINOGEN UA: 0.2

## 2016-06-02 MED ORDER — CIPROFLOXACIN HCL 500 MG PO TABS: 500.0000 mg | ORAL_TABLET | Freq: Two times a day (BID) | ORAL | 0 refills | Status: DC

## 2016-06-02 NOTE — Progress Notes (Signed)
   Subjective:    Patient ID: Kristina Pearson, female    DOB: May 13, 1956, 61 y.o.   MRN: [id number]  HPI Here for 2 days of urinary urgency and burning. No fever or back pain. She had some Cipro at home and she has taken 3 doses of this, and she does feel better today.    Review of Systems  Constitutional: Negative.   Genitourinary: Positive for dysuria, frequency and urgency. Negative for flank pain, hematuria and pelvic pain.       Objective:   Physical Exam  Constitutional: She appears well-developed and well-nourished.  Abdominal: Soft. Bowel sounds are normal. She exhibits no distension and no mass. There is no tenderness. There is no rebound and no guarding.          Assessment & Plan:  UTI, treat with Cipro and water. Culture the sample.  Laurey Morale, MD

## 2016-06-02 NOTE — Progress Notes (Signed)
Pre visit review using our clinic review tool, if applicable. No additional management support is needed unless otherwise documented below in the visit note. 

## 2016-06-05 ENCOUNTER — Other Ambulatory Visit: Payer: Self-pay | Admitting: Family Medicine

## 2016-06-05 LAB — URINE CULTURE

## 2016-06-05 MED ORDER — NITROFURANTOIN MONOHYD MACRO 100 MG PO CAPS: 100.0000 mg | ORAL_CAPSULE | Freq: Two times a day (BID) | ORAL | 0 refills | Status: DC

## 2016-06-08 ENCOUNTER — Other Ambulatory Visit: Payer: Self-pay | Admitting: Family Medicine

## 2016-06-23 ENCOUNTER — Telehealth: Payer: Self-pay | Admitting: Family Medicine

## 2016-06-23 ENCOUNTER — Other Ambulatory Visit: Payer: Self-pay | Admitting: Family Medicine

## 2016-06-23 ENCOUNTER — Other Ambulatory Visit (INDEPENDENT_AMBULATORY_CARE_PROVIDER_SITE_OTHER): Payer: Managed Care, Other (non HMO)

## 2016-06-23 DIAGNOSIS — R350 Frequency of micturition: Secondary | ICD-10-CM

## 2016-06-23 NOTE — Telephone Encounter (Signed)
Yes she can leave [place] a specimen as requested.

## 2016-06-23 NOTE — Telephone Encounter (Signed)
Patient wants to know if she can do a UA specimen and if it comes back negative to see if she can get it culture. Patient states that it has not resolved. Patient states that she understand that she may need to have appointment but wants to go ahead and do the specimen.  Contact Info:[phone]

## 2016-06-23 NOTE — Telephone Encounter (Signed)
Patient has been schedule.

## 2016-06-23 NOTE — Telephone Encounter (Signed)
I put in future orders and spoke with pt.

## 2016-06-23 NOTE — Telephone Encounter (Signed)
Can you put pt on lab schedule? Not sure what time she will be here.

## 2016-06-24 ENCOUNTER — Telehealth: Payer: Self-pay | Admitting: Family Medicine

## 2016-06-24 LAB — URINALYSIS
Bilirubin Urine: NEGATIVE
HGB URINE DIPSTICK: NEGATIVE
KETONES UR: NEGATIVE
Leukocytes, UA: NEGATIVE
Nitrite: NEGATIVE
TOTAL PROTEIN, URINE-UPE24: NEGATIVE
URINE GLUCOSE: NEGATIVE
UROBILINOGEN UA: 0.2 (ref 0.0–1.0)
pH: 6.5 (ref 5.0–8.0)

## 2016-06-24 NOTE — Telephone Encounter (Signed)
° ° °  Pt said her daughter has the flu aand she is taking care of her and her mother and is Kristina Pearson if you think she need should take tamaflu as a preventive    [phone]

## 2016-06-24 NOTE — Telephone Encounter (Signed)
Per Dr. Sarajane Jews he does not recommend this.

## 2016-06-25 LAB — URINE CULTURE

## 2016-06-26 ENCOUNTER — Ambulatory Visit: Payer: Managed Care, Other (non HMO) | Admitting: Adult Health

## 2016-07-01 ENCOUNTER — Encounter: Payer: Self-pay | Admitting: Family Medicine

## 2016-07-01 ENCOUNTER — Ambulatory Visit (INDEPENDENT_AMBULATORY_CARE_PROVIDER_SITE_OTHER): Payer: Managed Care, Other (non HMO) | Admitting: Family Medicine

## 2016-07-01 VITALS — BP 108/74 | HR 73 | Temp 98.1°F | Ht 66.75 in | Wt 153.0 lb

## 2016-07-01 DIAGNOSIS — R51 Headache: Secondary | ICD-10-CM

## 2016-07-01 DIAGNOSIS — G8929 Other chronic pain: Secondary | ICD-10-CM

## 2016-07-01 DIAGNOSIS — M1990 Unspecified osteoarthritis, unspecified site: Secondary | ICD-10-CM

## 2016-07-01 MED ORDER — GABAPENTIN 100 MG PO CAPS: 100.0000 mg | ORAL_CAPSULE | Freq: Three times a day (TID) | ORAL | 0 refills | Status: DC

## 2016-07-01 NOTE — Progress Notes (Signed)
   Subjective:    Patient ID: Kristina Pearson, female    DOB: 01/17/1956, 61 y.o.   MRN: [id number]  HPI Here to ask questions about arthritis and a recent MRA result. She saw Dr. Gwendel Hanson at [facility] about her headaches and an MRA of the brain was ordered. This was normal except for some diffuse microvascular changes which were interpreted as either resulting from ischemia or migraines. Of course she has struggled wit headaches for some years. She does not smoke, her glucoses are normal. Her BP is normal., and her lipid panels are normal. Dr. Rock Nephew as ordered a CT angiogram of the head to further evaluate a 1-2 mm outpouching at the mid M1 segment of the left MCA. She continues to have diffuse joint pains and she saw Dr. Amil Amen for this recently. Her labs were negative and he told her that she does not have RA. She has tried Indomethacin and this worked extremely well for her joint pains but it caused mouth ulcers. Celebrex helps a little and Gabapentin helps a little but she has not tried these together.   Review of Systems  Constitutional: Negative.   Respiratory: Negative.   Cardiovascular: Negative.   Musculoskeletal: Positive for arthralgias.  Neurological: Positive for headaches.       Objective:   Physical Exam  Constitutional: She is oriented to person, place, and time. She appears well-developed and well-nourished.  Cardiovascular: Normal rate, regular rhythm, normal heart sounds and intact distal pulses.   Pulmonary/Chest: Effort normal and breath sounds normal.  Neurological: She is alert and oriented to person, place, and time.          Assessment & Plan:  I told her that she has no risk factors for circulatory disease and that the small vessel changes seen on her <MRA are most likely the result of chronic migraines. I encouraged her to follow up with Dr. Rock Nephew and get the CTA study. I also encouraged her to follow up with Dr. Amil Amen. In the meantime she can  take Celebrex and Gabapentin together for the joint pains.  [name], MD

## 2016-07-08 ENCOUNTER — Ambulatory Visit: Payer: Managed Care, Other (non HMO) | Admitting: Family Medicine

## 2016-07-08 ENCOUNTER — Ambulatory Visit: Payer: Managed Care, Other (non HMO) | Admitting: Neurology

## 2016-07-20 ENCOUNTER — Encounter: Payer: Self-pay | Admitting: Family Medicine

## 2016-08-10 ENCOUNTER — Other Ambulatory Visit: Payer: Self-pay | Admitting: Hematology and Oncology

## 2016-08-10 DIAGNOSIS — Z853 Personal history of malignant neoplasm of breast: Secondary | ICD-10-CM

## 2016-08-17 ENCOUNTER — Ambulatory Visit
Admission: RE | Admit: 2016-08-17 | Discharge: 2016-08-17 | Disposition: A | Discharge status: Home / Self Care | Payer: Managed Care, Other (non HMO) | Source: Ambulatory Visit | Attending: Hematology and Oncology | Admitting: Hematology and Oncology

## 2016-08-17 DIAGNOSIS — Z853 Personal history of malignant neoplasm of breast: Secondary | ICD-10-CM

## 2016-08-21 ENCOUNTER — Telehealth: Payer: Self-pay | Admitting: Hematology and Oncology

## 2016-08-21 ENCOUNTER — Ambulatory Visit: Payer: Managed Care, Other (non HMO) | Admitting: Hematology and Oncology

## 2016-08-21 NOTE — Telephone Encounter (Signed)
Patient called to let us know that she is unable to make the appointment today at 11 am due to a migraine and will call back and reschedule

## 2016-08-21 NOTE — Telephone Encounter (Signed)
Notified Dr.Gudena of cancellation. Thank you.

## 2016-08-21 NOTE — Assessment & Plan Note (Deleted)
Right breast DCIS ER/PR positive: Patient is currently on antiestrogen therapy with tamoxifen since June 2015   Tamoxifen toxicities 1. Superficial phlebitis 2. Tenderness in the pectoralis muscles 3. Lack of interest in life for activities or exercise I discussed with her that it is certainly possible that tamoxifen may be the reason for all of her symptoms. I would like her to discontinue tamoxifen therapy and see how she feels in 3-6 months. If she felt much better then she may decide to discontinue permanently taking tamoxifen therapy. I discussed the risks and benefits of tamoxifen with her in detail. The benefits are not huge. If she cannot tolerate the medication then she can discontinue it.  Breast cancer Surveillance: 1. Mammograms 08/12/15: Normal Density C 2. Breast Exam: 08/21/16 Normal: Tenderness in the breast and chest wall on both sides including the axilla. It feels like musculoskeletal in nature no palpable abnormalities  RTC in one year for follow-up

## 2016-08-28 ENCOUNTER — Telehealth: Payer: Self-pay | Admitting: Family Medicine

## 2016-08-28 MED ORDER — LISDEXAMFETAMINE DIMESYLATE 40 MG PO CAPS: 40.0000 mg | ORAL_CAPSULE | ORAL | 0 refills | Status: DC

## 2016-08-28 NOTE — Telephone Encounter (Signed)
done

## 2016-08-28 NOTE — Telephone Encounter (Signed)
Pt needs new rx vyvanase 40 mg

## 2016-08-28 NOTE — Telephone Encounter (Signed)
Script is ready for pick up here at front office and I spoke with pt.  

## 2016-09-08 ENCOUNTER — Telehealth: Payer: Self-pay | Admitting: Family Medicine

## 2016-09-08 NOTE — Telephone Encounter (Signed)
Keep it the same. I doubt her insurance would cover "dissolving tablets"

## 2016-09-08 NOTE — Telephone Encounter (Signed)
Call in #180 with one rf  

## 2016-09-08 NOTE — Telephone Encounter (Signed)
Pharmacy calling to get a new Rx for alprazolam dissolving tablets #90

## 2016-09-08 NOTE — Telephone Encounter (Signed)
Same dosage and directions?

## 2016-09-09 MED ORDER — ALPRAZOLAM 0.5 MG PO TABS: 0.5000 mg | ORAL_TABLET | Freq: Two times a day (BID) | ORAL | 1 refills | Status: DC

## 2016-09-09 NOTE — Telephone Encounter (Signed)
I printed script and faxed to [facility]. 

## 2016-09-09 NOTE — Telephone Encounter (Signed)
I spoke with pt and she does not want to change to dissolving tablet. Please print regular script and fax to [facility].

## 2016-09-22 ENCOUNTER — Ambulatory Visit (INDEPENDENT_AMBULATORY_CARE_PROVIDER_SITE_OTHER): Payer: 59 | Admitting: Family Medicine

## 2016-09-22 ENCOUNTER — Other Ambulatory Visit: Payer: Self-pay | Admitting: Family Medicine

## 2016-09-22 ENCOUNTER — Encounter: Payer: Self-pay | Admitting: Family Medicine

## 2016-09-22 VITALS — BP 122/98 | HR 100 | Temp 98.9°F | Ht 66.75 in | Wt 154.0 lb

## 2016-09-22 DIAGNOSIS — B349 Viral infection, unspecified: Secondary | ICD-10-CM | POA: Diagnosis not present

## 2016-09-22 LAB — POCT INFLUENZA A/B
INFLUENZA B, POC: NEGATIVE
Influenza A, POC: NEGATIVE

## 2016-09-22 MED ORDER — HYDROCODONE-ACETAMINOPHEN 10-325 MG PO TABS: 1.0000 | ORAL_TABLET | Freq: Four times a day (QID) | ORAL | 0 refills | Status: DC | PRN

## 2016-09-22 NOTE — Progress Notes (Signed)
   Subjective:    Patient ID: Kristina Pearson, female    DOB: 1955/09/04, 61 y.o.   MRN: [id number]  HPI Here for 2 days of mild body aches and a mild ST. No fever or cough. She is concerned because her mother (who lives with her) is in the hospital now with pneumonia related to influenza B.    Review of Systems  Constitutional: Negative.   HENT: Positive for congestion and sore throat. Negative for postnasal drip, sinus pain and sinus pressure.   Eyes: Negative.   Respiratory: Negative.   Gastrointestinal: Negative.   Musculoskeletal: Positive for myalgias.       Objective:   Physical Exam  Constitutional: She appears well-developed and well-nourished.  HENT:  Right Ear: External ear normal.  Left Ear: External ear normal.  Nose: Nose normal.  Mouth/Throat: Oropharynx is clear and moist.  Eyes: Conjunctivae are normal.  Neck: No thyromegaly present.  Pulmonary/Chest: Effort normal and breath sounds normal. No respiratory distress. She has no wheezes. She has no rales.  Lymphadenopathy:    She has no cervical adenopathy.          Assessment & Plan:  Viral illness. Drink fluids and use Tylenol prn.  [name], MD

## 2016-09-22 NOTE — Patient Instructions (Signed)
WE NOW OFFER   [facility] FAST TRACK!!!  SAME DAY Appointments for ACUTE CARE  Such as: Sprains, Injuries, cuts, abrasions, rashes, muscle pain, joint pain, back pain Colds, flu, sore throats, headache, allergies, cough, fever  Ear pain, sinus and eye infections Abdominal pain, nausea, vomiting, diarrhea, upset stomach Animal/insect bites  3 Easy Ways to Schedule: Walk-In Scheduling Call in scheduling Mychart Sign-up: [email or web address]         

## 2016-09-22 NOTE — Progress Notes (Signed)
Pre visit review using our clinic review tool, if applicable. No additional management support is needed unless otherwise documented below in the visit note. 

## 2016-09-25 ENCOUNTER — Ambulatory Visit (INDEPENDENT_AMBULATORY_CARE_PROVIDER_SITE_OTHER): Payer: 59 | Admitting: Family Medicine

## 2016-09-25 ENCOUNTER — Encounter: Payer: Self-pay | Admitting: Family Medicine

## 2016-09-25 VITALS — BP 100/74 | HR 68 | Temp 99.3°F | Ht 66.75 in | Wt 155.9 lb

## 2016-09-25 DIAGNOSIS — J988 Other specified respiratory disorders: Secondary | ICD-10-CM

## 2016-09-25 LAB — POC INFLUENZA A&B (BINAX/QUICKVUE)
Influenza A, POC: NEGATIVE
Influenza B, POC: NEGATIVE

## 2016-09-25 MED ORDER — BENZONATATE 100 MG PO CAPS: 100.0000 mg | ORAL_CAPSULE | Freq: Two times a day (BID) | ORAL | 0 refills | Status: DC | PRN

## 2016-09-25 NOTE — Addendum Note (Signed)
Addended by: Agnes Lawrence on: 09/25/2016 05:15 PM   Modules accepted: Orders

## 2016-09-25 NOTE — Addendum Note (Signed)
Addended by: Lucretia Kern on: 09/25/2016 05:07 PM   Modules accepted: Orders

## 2016-09-25 NOTE — Progress Notes (Signed)
Pre visit review using our clinic review tool, if applicable. No additional management support is needed unless otherwise documented below in the visit note. 

## 2016-09-25 NOTE — Progress Notes (Addendum)
HPI:  Acute visit for cough and congestion: -started: 4-5 days ago, saw PCP and dx viral illness -symptoms:nasal congestion, sore throat, cough, one episode of diarrhea malasie and some body aches - back near R lower ribsworse then elsewhere and she is concerned for pneumonia, feels like fever, but never over 99 at home -mother with the flu, she wants to repeat the flu test - is adamant about this -denies:fever, SOB, NV, tooth pain, hemoptysis -has tried: nothing  ROS: See pertinent positives and negatives per HPI.  Past Medical History:  Diagnosis Date  . ADHD (attention deficit hyperactivity disorder)    ADD  . Allergy   . Arthritis    osteoarthritrs  . Breast cancer ([place]) 08/15/13   right lateral upper outer  . Fibromyalgia   . GERD (gastroesophageal reflux disease)   . Headache(784.0)   . History of depression   . Hx of radiation therapy 10/19/13- 11/09/13   right breast 4256 cGy in 16 sessions, hypo-fractionated  . Hypothyroid   . Migraine   . Osteopenia   . PONV (postoperative nausea and vomiting)   . Sialoadenitis   . Spinal stenosis   . Urinary incontinence   . Vitiligo   . Wears glasses     Past Surgical History:  Procedure Laterality Date  . BREAST LUMPECTOMY WITH RADIOACTIVE SEED LOCALIZATION Right 08/30/2013   Procedure: RIGHT PARTIAL MASTECTOMY WITH RADIOACTIVE SEED LOCALIZATION;  Surgeon: [name], MD;  Location: [facility];  Service: General;  Laterality: Right;  . BREAST SURGERY    . CHOLECYSTECTOMY  1996   lap  . COLONOSCOPY  2007   per Dr. Cristina Gong, clear, repeat in 10 yrs  . DIAGNOSTIC LAPAROSCOPY  1982   exp  . LAPAROSCOPIC OVARIAN CYSTECTOMY    . URETHRAL DILATION      Family History  Problem Relation Age of Onset  . Arthritis Mother   . Diabetes Mother   . Osteoporosis Mother   . Polymyalgia rheumatica Mother     on prednisone  . Heart disease Mother   . Hyperlipidemia Mother   . Hypertension Mother   . Stroke  Mother   . Heart attack Father   . Heart disease Father   . Colon cancer Maternal Uncle     dx in his 17s  . Colon cancer Cousin 88    mother's maternal first cousin  . Breast cancer Cousin 42    BRCA negative; father's maternal first cousin  . Leukemia Maternal Uncle 81    AML  . Melanoma Maternal Uncle 73  . Leukemia Maternal Uncle 53  . Ovarian cancer Cousin 27    maternal cousin  . Ovarian cancer Cousin 24    maternal cousin  . Leukemia Cousin     dx in his 58s; maternal cousin; thought to be the result of taking [place]  . Heart attack Paternal Uncle   . Colon cancer Cousin 85    maternal second cousin  . Breast cancer Other     maternal great aunt dx in her 79s  . Ovarian cancer Other     maternal grandfather's sister dx in her 4s  . Colon cancer Other     maternal grandfather's sister dx in her 57s  . Colon cancer Other     maternal grandfather's sister dx in her 5s  . Other Daughter     dermoid tumor  . Diabetes Paternal Grandmother   . Skin cancer Paternal Grandmother   . Hypertension  Paternal Grandmother     Social History   Social History  . Marital status: Married    Spouse name: N/A  . Number of children: 1  . Years of education: N/A   Social History Main Topics  . Smoking status: Former Smoker    Quit date: 09/14/1978  . Smokeless tobacco: Never Used     Comment: short time in college  . Alcohol use 0.6 - 1.2 oz/week    1 - 2 Standard drinks or equivalent per week  . Drug use: No  . Sexual activity: Yes    Partners: Male     Comment: menarche age 89, first live birth age 40, menopause age 89, no HRT   Other Topics Concern  . None   Social History Narrative   Lives with husband, daughter and mother in a 2 story home.  Has one child.  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  Education: college degree     Current Outpatient Prescriptions:  .  ALPRAZolam (XANAX) 0.5 MG tablet, Take 1 tablet (0.5 mg total) by mouth 2 (two) times  daily., Disp: 180 tablet, Rfl: 1 .  aspirin EC 81 MG tablet, Take 1 tablet (81 mg total) by mouth daily., Disp: 1 tablet, Rfl: 0 .  BIOTIN PO, Take by mouth., Disp: , Rfl:  .  celecoxib (CELEBREX) 200 MG capsule, Take 1 capsule (200 mg total) by mouth 2 (two) times daily., Disp: 180 capsule, Rfl: 3 .  Cholecalciferol (VITAMIN D PO), Take by mouth., Disp: , Rfl:  .  Cyanocobalamin (VITAMIN B12 PO), Take by mouth., Disp: , Rfl:  .  cycloSPORINE (RESTASIS) 0.05 % ophthalmic emulsion, Place 1 drop into both eyes 2 (two) times daily., Disp: , Rfl:  .  ELDERBERRY PO, Take 10 mLs by mouth daily., Disp: , Rfl:  .  gabapentin (NEURONTIN) 100 MG capsule, Take 1 capsule (100 mg total) by mouth 3 (three) times daily., Disp: 90 capsule, Rfl: 0 .  gabapentin (NEURONTIN) 300 MG capsule, TAKE ONE CAPSULE BY MOUTH 3 TIMES A DAY, Disp: 90 capsule, Rfl: 3 .  HYDROcodone-acetaminophen (NORCO) 10-325 MG tablet, Take 1 tablet by mouth every 6 (six) hours as needed for severe pain., Disp: 120 tablet, Rfl: 0 .  KRILL OIL PO, Take by mouth daily., Disp: , Rfl:  .  lisdexamfetamine (VYVANSE) 40 MG capsule, Take 1 capsule (40 mg total) by mouth every morning., Disp: 30 capsule, Rfl: 0 .  Magnesium 400 MG CAPS, Take by mouth daily., Disp: , Rfl:  .  methocarbamol (ROBAXIN) 500 MG tablet, TAKE 1 TABLET BY MOUTH EVERY 6 HOURS AS NEEDED FOR MUSCLE SPASMS, Disp: 120 tablet, Rfl: 1 .  Multiple Vitamin (MULTIVITAMIN) tablet, Take 1 tablet by mouth daily. One a day for over age 52, Disp: , Rfl:  .  multivitamin-lutein (OCUVITE-LUTEIN) CAPS capsule, Take 1 capsule by mouth daily. Reported on 06/08/2015, Disp: , Rfl:  .  Probiotic Product (PROBIOTIC DAILY PO), Take by mouth., Disp: , Rfl:  .  ranitidine (ZANTAC) 300 MG tablet, TAKE 1 TABLET (300 MG TOTAL) BY MOUTH 2 (TWO) TIMES DAILY., Disp: 180 tablet, Rfl: 3 .  SYNTHROID 112 MCG tablet, , Disp: , Rfl:  .  tamoxifen (NOLVADEX) 20 MG tablet, Take 1 tablet (20 mg total) by mouth  daily., Disp: 90 tablet, Rfl: 3 .  traZODone (DESYREL) 100 MG tablet, TAKE 2 TABLETS EVERY NIGHT, Disp: 180 tablet, Rfl: 3 .  benzonatate (TESSALON) 100 MG capsule, Take  1 capsule (100 mg total) by mouth 2 (two) times daily as needed for cough., Disp: 20 capsule, Rfl: 0  EXAM:  Vitals:   09/25/16 1639  BP: 100/74  Pulse: 68  Temp: 99.3 F (37.4 C)    Body mass index is 24.6 kg/m.  GENERAL: vitals reviewed and listed above, alert, oriented, appears well hydrated and in no acute distress  HEENT: atraumatic, conjunttiva clear, no obvious abnormalities on inspection of external nose and ears, normal appearance of ear canals and TMs, clear nasal congestion, mild post oropharyngeal erythema with PND, no tonsillar edema or exudate, no sinus TTP  NECK: no obvious masses on inspection  LUNGS: clear to auscultation bilaterally, no wheezes, rales or rhonchi, good air movement  CV: HRRR, no peripheral edema  MS: moves all extremities without noticeable abnormality  PSYCH: pleasant and cooperative, no obvious depression or anxiety  ASSESSMENT AND PLAN:  Discussed the following assessment and plan:  Respiratory infection - Plan: DG Chest 2 View  We discussed potential/likely etiologies, with viral URI or influenza being most likely. We discussed risks/benefits/indications/best timing for tamiflu, likely course, transmission, potential complications, signs of developing a serious illness and return and emergency precuations. Discussed that the flu test is not 100%. CXR to r/o pna. Tessalon for cough, symptomatic care o/w with return precautions and advised prompt follow up if worsening, trouble breathing or not improving as expected.    Patient Instructions  Get the chest xray - if this shows pneumonia we will advise an antibiotic.  Tessalon as needed for cough.  Plenty of fluids.  Tylenol or ibuprofen only as needed  I hope you are feeling better soon! Seek care immediately if  worsening, new concerns or you are not improving with treatment.   Influenza, Adult Influenza, more commonly known as "the flu," is a viral infection that primarily affects the respiratory tract. The respiratory tract includes organs that help you breathe, such as the lungs, nose, and throat. The flu causes many common cold symptoms, as well as a high fever and body aches. The flu spreads easily from person to person (is contagious). Getting a flu shot (influenza vaccination) every year is the best way to prevent influenza. What are the causes? Influenza is caused by a virus. You can catch the virus by:  Breathing in droplets from an infected person's cough or sneeze.  Touching something that was recently contaminated with the virus and then touching your mouth, nose, or eyes. What increases the risk? The following factors may make you more likely to get the flu:  Not cleaning your hands frequently with soap and water or alcohol-based hand sanitizer.  Having close contact with many people during cold and flu season.  Touching your mouth, eyes, or nose without washing or sanitizing your hands first.  Not drinking enough fluids or not eating a healthy diet.  Not getting enough sleep or exercise.  Being under a high amount of stress.  Not getting a yearly (annual) flu shot. You may be at a higher risk of complications from the flu, such as a severe lung infection (pneumonia), if you:  Are over the age of 62.  Are pregnant.  Have a weakened disease-fighting system (immune system). You may have a weakened immune system if you:  Have HIV or AIDS.  Are undergoing chemotherapy.  Aretaking medicines that reduce the activity of (suppress) the immune system.  Have a long-term (chronic) illness, such as heart disease, kidney disease, diabetes, or lung disease.  Have  a liver disorder.  Are obese.  Have anemia. What are the signs or symptoms? Symptoms of this condition typically  last 4-10 days and may include:  Fever.  Chills.  Headache, body aches, or muscle aches.  Sore throat.  Cough.  Runny or congested nose.  Chest discomfort and cough.  Poor appetite.  Weakness or tiredness (fatigue).  Dizziness.  Nausea or vomiting. How is this diagnosed? This condition may be diagnosed based on your medical history and a physical exam. Your health care provider may do a nose or throat swab test to confirm the diagnosis. How is this treated? If influenza is detected early, you can be treated with antiviral medicine that can reduce the length of your illness and the severity of your symptoms. This medicine may be given by mouth (orally) or through an IV tube that is inserted in one of your veins. The goal of treatment is to relieve symptoms by taking care of yourself at home. This may include taking over-the-counter medicines, drinking plenty of fluids, and adding humidity to the air in your home. In some cases, influenza goes away on its own. Severe influenza or complications from influenza may be treated in a hospital. Follow these instructions at home:  Take over-the-counter and prescription medicines only as told by your health care provider.  Use a cool mist humidifier to add humidity to the air in your home. This can make breathing easier.  Rest as needed.  Drink enough fluid to keep your urine clear or pale yellow.  Cover your mouth and nose when you cough or sneeze.  Wash your hands with soap and water often, especially after you cough or sneeze. If soap and water are not available, use hand sanitizer.  Stay home from work or school as told by your health care provider. Unless you are visiting your health care provider, try to avoid leaving home until your fever has been gone for 24 hours without the use of medicine.  Keep all follow-up visits as told by your health care provider. This is important. How is this prevented?  Getting an annual flu  shot is the best way to avoid getting the flu. You may get the flu shot in late summer, fall, or winter. Ask your health care provider when you should get your flu shot.  Wash your hands often or use hand sanitizer often.  Avoid contact with people who are sick during cold and flu season.  Eat a healthy diet, drink plenty of fluids, get enough sleep, and exercise regularly.  Get help right away if:  You develop shortness of breath or difficulty breathing.  Your skin or nails turn a bluish color.  You have severe pain or stiffness in your neck.  You develop a sudden headache or sudden pain in your face or ear.  You cannot stop vomiting. This information is not intended to replace advice given to you by your health care provider. Make sure you discuss any questions you have with your health care provider. Document Released: 06/05/2000 Document Revised: 11/14/2015 Document Reviewed: 04/02/2015 Elsevier Interactive Patient Education  2017 [employer] DO

## 2016-09-25 NOTE — Patient Instructions (Addendum)
Get the chest xray - if this shows pneumonia we will advise an antibiotic.  Tessalon as needed for cough.  Plenty of fluids.  Tylenol or ibuprofen only as needed  I hope you are feeling better soon! Seek care immediately if worsening, new concerns or you are not improving with treatment.   Influenza, Adult Influenza, more commonly known as "the flu," is a viral infection that primarily affects the respiratory tract. The respiratory tract includes organs that help you breathe, such as the lungs, nose, and throat. The flu causes many common cold symptoms, as well as a high fever and body aches. The flu spreads easily from person to person (is contagious). Getting a flu shot (influenza vaccination) every year is the best way to prevent influenza. What are the causes? Influenza is caused by a virus. You can catch the virus by:  Breathing in droplets from an infected person's cough or sneeze.  Touching something that was recently contaminated with the virus and then touching your mouth, nose, or eyes. What increases the risk? The following factors may make you more likely to get the flu:  Not cleaning your hands frequently with soap and water or alcohol-based hand sanitizer.  Having close contact with many people during cold and flu season.  Touching your mouth, eyes, or nose without washing or sanitizing your hands first.  Not drinking enough fluids or not eating a healthy diet.  Not getting enough sleep or exercise.  Being under a high amount of stress.  Not getting a yearly (annual) flu shot. You may be at a higher risk of complications from the flu, such as a severe lung infection (pneumonia), if you:  Are over the age of 38.  Are pregnant.  Have a weakened disease-fighting system (immune system). You may have a weakened immune system if you:  Have HIV or AIDS.  Are undergoing chemotherapy.  Aretaking medicines that reduce the activity of (suppress) the immune  system.  Have a long-term (chronic) illness, such as heart disease, kidney disease, diabetes, or lung disease.  Have a liver disorder.  Are obese.  Have anemia. What are the signs or symptoms? Symptoms of this condition typically last 4-10 days and may include:  Fever.  Chills.  Headache, body aches, or muscle aches.  Sore throat.  Cough.  Runny or congested nose.  Chest discomfort and cough.  Poor appetite.  Weakness or tiredness (fatigue).  Dizziness.  Nausea or vomiting. How is this diagnosed? This condition may be diagnosed based on your medical history and a physical exam. Your health care provider may do a nose or throat swab test to confirm the diagnosis. How is this treated? If influenza is detected early, you can be treated with antiviral medicine that can reduce the length of your illness and the severity of your symptoms. This medicine may be given by mouth (orally) or through an IV tube that is inserted in one of your veins. The goal of treatment is to relieve symptoms by taking care of yourself at home. This may include taking over-the-counter medicines, drinking plenty of fluids, and adding humidity to the air in your home. In some cases, influenza goes away on its own. Severe influenza or complications from influenza may be treated in a hospital. Follow these instructions at home:  Take over-the-counter and prescription medicines only as told by your health care provider.  Use a cool mist humidifier to add humidity to the air in your home. This can make breathing easier.  Rest as needed.  Drink enough fluid to keep your urine clear or pale yellow.  Cover your mouth and nose when you cough or sneeze.  Wash your hands with soap and water often, especially after you cough or sneeze. If soap and water are not available, use hand sanitizer.  Stay home from work or school as told by your health care provider. Unless you are visiting your health care  provider, try to avoid leaving home until your fever has been gone for 24 hours without the use of medicine.  Keep all follow-up visits as told by your health care provider. This is important. How is this prevented?  Getting an annual flu shot is the best way to avoid getting the flu. You may get the flu shot in late summer, fall, or winter. Ask your health care provider when you should get your flu shot.  Wash your hands often or use hand sanitizer often.  Avoid contact with people who are sick during cold and flu season.  Eat a healthy diet, drink plenty of fluids, get enough sleep, and exercise regularly.  Get help right away if:  You develop shortness of breath or difficulty breathing.  Your skin or nails turn a bluish color.  You have severe pain or stiffness in your neck.  You develop a sudden headache or sudden pain in your face or ear.  You cannot stop vomiting. This information is not intended to replace advice given to you by your health care provider. Make sure you discuss any questions you have with your health care provider. Document Released: 06/05/2000 Document Revised: 11/14/2015 Document Reviewed: 04/02/2015 Elsevier Interactive Patient Education  2017 [employer].

## 2016-09-28 ENCOUNTER — Telehealth: Payer: Self-pay | Admitting: Family Medicine

## 2016-09-28 ENCOUNTER — Ambulatory Visit (INDEPENDENT_AMBULATORY_CARE_PROVIDER_SITE_OTHER)
Admission: RE | Admit: 2016-09-28 | Discharge: 2016-09-28 | Disposition: A | Discharge status: Home / Self Care | Payer: 59 | Source: Ambulatory Visit | Attending: Family Medicine | Admitting: Family Medicine

## 2016-09-28 DIAGNOSIS — J988 Other specified respiratory disorders: Secondary | ICD-10-CM

## 2016-09-28 NOTE — Telephone Encounter (Signed)
° ° ° °  Pt call to say she would like a call back at the end of the day concerning her xray

## 2016-09-28 NOTE — Telephone Encounter (Signed)
See results, this was done.

## 2016-11-05 ENCOUNTER — Other Ambulatory Visit (HOSPITAL_COMMUNITY)
Admission: RE | Admit: 2016-11-05 | Discharge: 2016-11-05 | Disposition: A | Discharge status: Home / Self Care | Payer: 59 | Source: Ambulatory Visit | Attending: Obstetrics & Gynecology | Admitting: Obstetrics & Gynecology

## 2016-11-05 ENCOUNTER — Encounter: Payer: Self-pay | Admitting: Obstetrics & Gynecology

## 2016-11-05 ENCOUNTER — Ambulatory Visit (INDEPENDENT_AMBULATORY_CARE_PROVIDER_SITE_OTHER): Payer: 59 | Admitting: Obstetrics & Gynecology

## 2016-11-05 VITALS — BP 100/60 | HR 80 | Resp 14 | Ht 66.0 in | Wt 153.0 lb

## 2016-11-05 DIAGNOSIS — Z01419 Encounter for gynecological examination (general) (routine) without abnormal findings: Secondary | ICD-10-CM

## 2016-11-05 DIAGNOSIS — Z124 Encounter for screening for malignant neoplasm of cervix: Secondary | ICD-10-CM | POA: Diagnosis not present

## 2016-11-05 MED ORDER — NONFORMULARY OR COMPOUNDED ITEM: 3 refills | Status: DC

## 2016-11-05 NOTE — Progress Notes (Signed)
61 y.o. G2P1 MarriedCaucasianF here for annual exam.  Pt reports she's been having some right facial pressure and has been followed by neurology at [facility], Dr. Rock Nephew.  MRI and MRA was normal except for non-specific white matter changes.  Has tried gabapentin but this caused too much sedation and weight gain.  Now on Topamax.  She feels this is helping.    Hasn't been back to see Dr. Lindi Adie.  Stopped her tamoxifen.  States she just doesn't want to go back right now.    Mother is living with pt and she is the primary care giver.  This is very stressful for her.  Her mother is 69.    Patient's last menstrual period was 09/21/2006.          Sexually active: Yes.    The current method of family planning is post menopausal status.    Exercising: No.  The patient does not participate in regular exercise at present. Smoker:  no  Health Maintenance: Pap:  10/07/15 Neg. HR HPV:neg   09/28/14 neg  History of abnormal Pap:  no MMG:  08/17/16 BIRADS2:Benign  Colonoscopy:  2007 f/u 10 years  BMD:   07/26/13 Osteopenia  TDaP:  09/2014  Pneumonia vaccine(s):  No Zostavax:   No Hep C testing: 05/22/16 Neg  Screening Labs: PCP   reports that she quit smoking about 38 years ago. She has never used smokeless tobacco. She reports that she drinks about 0.6 - 1.2 oz of alcohol per week . She reports that she does not use drugs.  Past Medical History:  Diagnosis Date  . ADHD (attention deficit hyperactivity disorder)    ADD  . Allergy   . Arthritis    osteoarthritrs  . Breast cancer ([place]) 08/15/13   right lateral upper outer  . Fibromyalgia   . GERD (gastroesophageal reflux disease)   . Headache(784.0)   . History of depression   . Hx of radiation therapy 10/19/13- 11/09/13   right breast 4256 cGy in 16 sessions, hypo-fractionated  . Hypothyroid   . Migraine   . Osteopenia   . PONV (postoperative nausea and vomiting)   . Sialoadenitis   . Spinal stenosis   . Urinary incontinence   . Vitiligo   . Wears  glasses     Past Surgical History:  Procedure Laterality Date  . BREAST LUMPECTOMY WITH RADIOACTIVE SEED LOCALIZATION Right 08/30/2013   Procedure: RIGHT PARTIAL MASTECTOMY WITH RADIOACTIVE SEED LOCALIZATION;  Surgeon: [name], MD;  Location: [facility];  Service: General;  Laterality: Right;  . BREAST SURGERY    . CHOLECYSTECTOMY  1996   lap  . COLONOSCOPY  2007   per Dr. Cristina Gong, clear, repeat in 10 yrs  . DIAGNOSTIC LAPAROSCOPY  1982   exp  . LAPAROSCOPIC OVARIAN CYSTECTOMY    . URETHRAL DILATION     Current Outpatient Prescriptions on File Prior to Visit  Medication Sig Dispense Refill  . ALPRAZolam (XANAX) 0.5 MG tablet Take 1 tablet (0.5 mg total) by mouth 2 (two) times daily. 180 tablet 1  . aspirin EC 81 MG tablet Take 1 tablet (81 mg total) by mouth daily. 1 tablet 0  . celecoxib (CELEBREX) 200 MG capsule Take 1 capsule (200 mg total) by mouth 2 (two) times daily. 180 capsule 3  . Cholecalciferol (VITAMIN D PO) Take by mouth.    . cycloSPORINE (RESTASIS) 0.05 % ophthalmic emulsion Place 1 drop into both eyes 2 (two) times daily.    Marland Kitchen  ELDERBERRY PO Take 10 mLs by mouth daily.    Marland Kitchen HYDROcodone-acetaminophen (NORCO) 10-325 MG tablet Take 1 tablet by mouth every 6 (six) hours as needed for severe pain. 120 tablet 0  . lisdexamfetamine (VYVANSE) 40 MG capsule Take 1 capsule (40 mg total) by mouth every morning. 30 capsule 0  . Magnesium 400 MG CAPS Take by mouth daily.    . methocarbamol (ROBAXIN) 500 MG tablet TAKE 1 TABLET BY MOUTH EVERY 6 HOURS AS NEEDED FOR MUSCLE SPASMS 120 tablet 1  . Multiple Vitamin (MULTIVITAMIN) tablet Take 1 tablet by mouth daily. One a day for over age 43    . multivitamin-lutein (OCUVITE-LUTEIN) CAPS capsule Take 1 capsule by mouth daily. Reported on 06/08/2015    . Probiotic Product (PROBIOTIC DAILY PO) Take by mouth.    . ranitidine (ZANTAC) 300 MG tablet TAKE 1 TABLET (300 MG TOTAL) BY MOUTH 2 (TWO) TIMES DAILY. 180 tablet 3   . SYNTHROID 112 MCG tablet     . traZODone (DESYREL) 100 MG tablet TAKE 2 TABLETS EVERY NIGHT 180 tablet 3   No current facility-administered medications on file prior to visit.      Family History  Problem Relation Age of Onset  . Arthritis Mother   . Diabetes Mother   . Osteoporosis Mother   . Polymyalgia rheumatica Mother        on prednisone  . Heart disease Mother   . Hyperlipidemia Mother   . Hypertension Mother   . Stroke Mother   . Heart attack Father   . Heart disease Father   . Colon cancer Maternal Uncle        dx in his 64s  . Colon cancer Cousin 42       mother's maternal first cousin  . Breast cancer Cousin 58       BRCA negative; father's maternal first cousin  . Leukemia Maternal Uncle 81       AML  . Melanoma Maternal Uncle 73  . Leukemia Maternal Uncle 72  . Ovarian cancer Cousin 70       maternal cousin  . Ovarian cancer Cousin 24       maternal cousin  . Leukemia Cousin        dx in his 35s; maternal cousin; thought to be the result of taking [place]  . Heart attack Paternal Uncle   . Colon cancer Cousin 31       maternal second cousin  . Breast cancer Other        maternal great aunt dx in her 15s  . Ovarian cancer Other        maternal grandfather's sister dx in her 32s  . Colon cancer Other        maternal grandfather's sister dx in her 69s  . Colon cancer Other        maternal grandfather's sister dx in her 13s  . Other Daughter        dermoid tumor  . Diabetes Paternal Grandmother   . Skin cancer Paternal Grandmother   . Hypertension Paternal Grandmother     ROS:  Pertinent items are noted in HPI.  Otherwise, a comprehensive ROS was negative.  Exam:   BP 100/60 (BP Location: Right Arm, Patient Position: Sitting, Cuff Size: Normal)   Pulse 80   Resp 14   Ht _0  (1.676 m)   Wt 153 lb (69.4 kg)   LMP 09/21/2006   BMI 24.69 kg/m   Weight change: +  3#  Height: _0  (167.6 cm)  Ht Readings from Last 3 Encounters:  11/05/16 _1   (1.676 m)  09/25/16 5' 6.75" (1.695 m)  09/22/16 5' 6.75" (1.695 m)    General appearance: alert, cooperative and appears stated age Head: Normocephalic, without obvious abnormality, atraumatic Neck: no adenopathy, supple, symmetrical, trachea midline and thyroid normal to inspection and palpation Lungs: clear to auscultation bilaterally Breasts: right breast with scar and radiation changes as in picture, left breast normal without masses, LAD, nipple discharge, skin changes    Heart: regular rate and rhythm Abdomen: soft, non-tender; bowel sounds normal; no masses,  no organomegaly Extremities: extremities normal, atraumatic, no cyanosis or edema Skin: Skin color, texture, turgor normal. No rashes or lesions Lymph nodes: Cervical, supraclavicular, and axillary nodes normal. No abnormal inguinal nodes palpated Neurologic: Grossly normal   Pelvic: External genitalia:  no lesions              Urethra:  normal appearing urethra with no masses, tenderness or lesions              Bartholins and Skenes: normal                 Vagina: normal appearing vagina with normal color and discharge, no lesions           Cervix: no lesions              Pap taken: Yes.   Bimanual Exam:  Uterus:  normal size, contour, position, consistency, mobility, non-tender              Adnexa: normal adnexa and no mass, fullness, tenderness               Rectovaginal: Confirms               Anus:  normal sphincter tone, no lesions  Chaperone was present for exam.  A:  Well Woman with normal exam PMP, no HRT H/O right breast DSIC, ER/PR positive, s/p lumpectomy/radiation, stopped tamoxifen after 2 1/2 years due to side effects Vaginal atropic changes Fibromyalgia Spinal stenosis H/O ADHD GERD  P:   Mammogram guidelines reviewed pap smear obtained today.  Pt desires yearly pap smears. Rx for shingles vaccine given to pt today Repeat BMD next year Will plan to stretch out her exam next year to try and  have her MMG and physical exam every six months.  She has decided not to follow up with oncology at this point.  D/w this with pt but she feels it just caused too much stress and she is not interested in any other treatments.  She took Tamoxifen for 2 1/2 years so she does have benefit from this. Return annually or prn

## 2016-11-06 LAB — CYTOLOGY - PAP: DIAGNOSIS: NEGATIVE

## 2017-01-05 ENCOUNTER — Telehealth: Payer: Self-pay | Admitting: Family Medicine

## 2017-01-05 NOTE — Telephone Encounter (Signed)
Pt need new Rx for VYVANSE.  Pt is aware of 3 business days for refills and someone will call when ready for pick up.

## 2017-01-06 MED ORDER — LISDEXAMFETAMINE DIMESYLATE 40 MG PO CAPS: 40.0000 mg | ORAL_CAPSULE | ORAL | 0 refills | Status: DC

## 2017-01-06 NOTE — Telephone Encounter (Signed)
Script is ready for pick up here at front office and I spoke with pt.  

## 2017-01-06 NOTE — Telephone Encounter (Signed)
done

## 2017-01-19 ENCOUNTER — Ambulatory Visit: Payer: Managed Care, Other (non HMO) | Admitting: Obstetrics & Gynecology

## 2017-01-21 ENCOUNTER — Ambulatory Visit: Payer: 59 | Admitting: Podiatry

## 2017-02-01 ENCOUNTER — Other Ambulatory Visit: Payer: Self-pay | Admitting: Hematology and Oncology

## 2017-02-01 DIAGNOSIS — C50411 Malignant neoplasm of upper-outer quadrant of right female breast: Secondary | ICD-10-CM

## 2017-02-08 ENCOUNTER — Other Ambulatory Visit: Payer: Self-pay | Admitting: Family Medicine

## 2017-02-11 ENCOUNTER — Ambulatory Visit: Payer: 59

## 2017-02-11 ENCOUNTER — Ambulatory Visit (INDEPENDENT_AMBULATORY_CARE_PROVIDER_SITE_OTHER): Payer: 59

## 2017-02-11 ENCOUNTER — Ambulatory Visit (INDEPENDENT_AMBULATORY_CARE_PROVIDER_SITE_OTHER): Payer: 59 | Admitting: Podiatry

## 2017-02-11 ENCOUNTER — Encounter: Payer: Self-pay | Admitting: Podiatry

## 2017-02-11 DIAGNOSIS — Q828 Other specified congenital malformations of skin: Secondary | ICD-10-CM

## 2017-02-11 DIAGNOSIS — M76822 Posterior tibial tendinitis, left leg: Secondary | ICD-10-CM

## 2017-02-11 NOTE — Progress Notes (Signed)
She presents today with chief complaint of pain to the forefoot right. States that she think she has a callus or wart. She states that her orthotics really don't seem to work very well and she would like to follow up with Liliane Channel to see if she gets her orthotics readjusted.  Objective: Vital signs are stable she is alert and oriented 3. She is solitary for keratoma but below the plantar aspect of the second metatarsal the right foot does not demonstrate any type of verrucoid Ines. She does have ankle pain and plantar fasciitis type pain however I think that this is more than likely compensatory to the fasciitis and is resulting in the symptoms. She does have tenderness on palpation of the saphenous nerve to the level of the knee however I feel that this is more than likely secondary to her back which she does have several back problems.  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 History plantar fasciitis.  Plan: She'll follow up with Liliane Channel. New orthotics.

## 2017-02-12 ENCOUNTER — Telehealth: Payer: Self-pay | Admitting: Podiatry

## 2017-02-12 NOTE — Telephone Encounter (Signed)
Left voicemail for pt to call me back to go over benefits for orthotics.

## 2017-02-17 NOTE — Progress Notes (Signed)
error 

## 2017-03-11 ENCOUNTER — Encounter: Payer: Self-pay | Admitting: Family Medicine

## 2017-05-11 ENCOUNTER — Ambulatory Visit: Payer: 59 | Admitting: Podiatry

## 2017-06-02 ENCOUNTER — Telehealth: Payer: Self-pay | Admitting: Family Medicine

## 2017-06-02 NOTE — Telephone Encounter (Signed)
Copied from [place]. Topic: Quick Communication - Rx Refill/Question >> Jun 02, 2017  4:47 PM Marin Olp L wrote: Has the patient contacted their pharmacy? No. (Agent: If no, request that the patient contact the pharmacy for the refill.) Preferred Pharmacy (with phone number or street name): picks up in office Agent: Please be advised that RX refills may take up to 3 business days. We ask that you follow-up with your pharmacy. Patient has appt for flu shot 4:15pm on 05/05/2017 and wants to know if it will be ready by then?

## 2017-06-03 NOTE — Telephone Encounter (Signed)
Called pt she needs a refill on her Vyvanse. Pt will be here tomorrow to get her flu shot. Sent to PCP for approval.

## 2017-06-04 ENCOUNTER — Ambulatory Visit (INDEPENDENT_AMBULATORY_CARE_PROVIDER_SITE_OTHER): Payer: 59

## 2017-06-04 ENCOUNTER — Telehealth: Payer: Self-pay

## 2017-06-04 DIAGNOSIS — Z23 Encounter for immunization: Secondary | ICD-10-CM

## 2017-06-04 MED ORDER — LISDEXAMFETAMINE DIMESYLATE 40 MG PO CAPS: 40.0000 mg | ORAL_CAPSULE | ORAL | 0 refills | Status: DC

## 2017-06-04 NOTE — Addendum Note (Signed)
Addended by: [name] A on: 06/04/2017 01:08 PM   Modules accepted: Orders

## 2017-06-04 NOTE — Telephone Encounter (Signed)
REFILL REQUEST FOR OMEPRAZOLE dr CAPS 40 MG this medication is not on the patient current medication list. Call pt.

## 2017-06-04 NOTE — Telephone Encounter (Signed)
Pt advised and voiced understanding.   

## 2017-06-04 NOTE — Telephone Encounter (Signed)
I emailed refills to her pharmacy

## 2017-06-18 ENCOUNTER — Telehealth: Payer: Self-pay

## 2017-06-18 NOTE — Telephone Encounter (Signed)
Requesting for a 90 day supply of omeprazole DR caps 40 MG

## 2017-06-18 NOTE — Telephone Encounter (Signed)
Last OV 10/19/2016. Rx has never been refilled by Dr. Sarajane Jews.

## 2017-06-23 NOTE — Telephone Encounter (Signed)
Called and spoke with the pt she stated that over the holidays her acid reflex started to flare up really bad and the ranitidine didn't seem to help much. However, pt stated that she is feeling much better and the ranitidine is now working and she changed her eating habits. She believes increased stress just triggered it. Pt stated that we do NOT need to refill the omeprazole at this time.

## 2017-07-13 ENCOUNTER — Other Ambulatory Visit: Payer: Self-pay | Admitting: Family Medicine

## 2017-07-13 NOTE — Telephone Encounter (Signed)
Last OV 09/22/2016.   Rx was last refilled 05/22/2016 disp 180 with 3 refills.  Sent to PCP for approval.

## 2017-07-14 ENCOUNTER — Telehealth: Payer: Self-pay | Admitting: Family Medicine

## 2017-07-14 NOTE — Telephone Encounter (Signed)
Pt calling back for Dr. Murrell Redden nurse.

## 2017-07-14 NOTE — Telephone Encounter (Signed)
Called pt and was unable to leave a VM due to pt's mailbox being full.

## 2017-07-14 NOTE — Telephone Encounter (Signed)
Duplicated message.

## 2017-07-14 NOTE — Telephone Encounter (Signed)
Copied from [place]. Topic: Quick Communication - See Telephone Encounter >> Jul 14, 2017  1:28 PM Aurelio Brash B wrote: CRM for notification. See Telephone encounter for:  Pt is requesting a call from Dr Barbie Banner nurse.  The pt would not state the the reason for the call 07/14/17.

## 2017-07-14 NOTE — Telephone Encounter (Signed)
Called and spoke with pt.   Pt wanted a refill on her vyvanse  [facility] [address]   Last OV 09/22/2016.   Rx was last refilled 06/04/2017 disp 30 with no refills.  Sent to PCP

## 2017-07-15 NOTE — Telephone Encounter (Signed)
She already as refills until March 14

## 2017-07-15 NOTE — Telephone Encounter (Signed)
Called and spoke with pt. Pt advised and voiced understanding. Pt plans to get in touch with her  pharmacy.

## 2017-07-20 ENCOUNTER — Telehealth: Payer: Self-pay | Admitting: Hematology and Oncology

## 2017-07-20 NOTE — Telephone Encounter (Signed)
Spoke to patient regarding upcoming February appointments per 1/28 sch message.

## 2017-07-21 ENCOUNTER — Telehealth: Payer: Self-pay | Admitting: Family Medicine

## 2017-07-21 NOTE — Telephone Encounter (Signed)
Copied from [place]. Topic: Quick Communication - See Telephone Encounter >> Jul 21, 2017  1:12 PM Darl Householder, RMA wrote: Patient is calling for a 2nd time requesting call from Dr/ [facility] CMA concerning prior authorization   I tried to reach pt to get more information, no answer.

## 2017-07-21 NOTE — Telephone Encounter (Signed)
Called and spoke with pt. Pt stated that she will call her pharmacy to have them fax [place] the PA for her Vyvanse because we have not received any faxes.

## 2017-07-23 NOTE — Telephone Encounter (Signed)
Caller name: Reann Dobias Relationship to patient: self Can be reached: [phone] Pharmacy:  [id number] [place], [place] [phone] (Phone) [phone] (Fax)   Reason for call: Pt called to follow up on PA. [facility] 07/21/17 and they were supposed to fax it.  Please advise.

## 2017-07-27 NOTE — Telephone Encounter (Signed)
Prior auth sent to [email or web address].

## 2017-07-28 ENCOUNTER — Telehealth: Payer: Self-pay | Admitting: Family Medicine

## 2017-07-28 NOTE — Telephone Encounter (Signed)
Sent to [place]m has this been started yet?

## 2017-07-28 NOTE — Telephone Encounter (Signed)
Copied from [place] [id number]. Topic: General - Other >> Jul 28, 2017 11:20 AM Yvette Rack wrote: Reason for CRM: patient calling to f/u on PA for lisdexamfetamine (VYVANSE) 40 MG capsule would like to speak with Dr Sarajane Jews assistant

## 2017-07-30 NOTE — Telephone Encounter (Signed)
Letter received from [facility] case [id number] stating Vyvanse was denied due to the pt not having tried generic medications in this same class.  Letter given to Dr Barbie Banner asst.

## 2017-07-30 NOTE — Telephone Encounter (Signed)
See prior note

## 2017-07-30 NOTE — Telephone Encounter (Signed)
Sent to PCP ?

## 2017-08-02 ENCOUNTER — Inpatient Hospital Stay: Payer: 59 | Attending: Hematology and Oncology | Admitting: Hematology and Oncology

## 2017-08-02 ENCOUNTER — Telehealth: Payer: Self-pay | Admitting: Hematology and Oncology

## 2017-08-02 VITALS — BP 116/71 | HR 60 | Temp 98.3°F | Resp 18 | Ht 66.0 in | Wt 150.4 lb

## 2017-08-02 DIAGNOSIS — D0511 Intraductal carcinoma in situ of right breast: Secondary | ICD-10-CM | POA: Insufficient documentation

## 2017-08-02 DIAGNOSIS — C50411 Malignant neoplasm of upper-outer quadrant of right female breast: Secondary | ICD-10-CM

## 2017-08-02 DIAGNOSIS — Z170 Estrogen receptor positive status [ER+]: Secondary | ICD-10-CM | POA: Insufficient documentation

## 2017-08-02 DIAGNOSIS — Z923 Personal history of irradiation: Secondary | ICD-10-CM | POA: Diagnosis not present

## 2017-08-02 DIAGNOSIS — Z780 Asymptomatic menopausal state: Secondary | ICD-10-CM | POA: Insufficient documentation

## 2017-08-02 DIAGNOSIS — Z79899 Other long term (current) drug therapy: Secondary | ICD-10-CM

## 2017-08-02 MED ORDER — GABAPENTIN 300 MG PO CAPS: 300.0000 mg | ORAL_CAPSULE | Freq: Every day | ORAL | Status: DC

## 2017-08-02 NOTE — Assessment & Plan Note (Signed)
Right breast DCIS ER/PR positive: Patient is currently on antiestrogen therapy with tamoxifen  June 2015 -March 2017 stopped early due to lack of energy and decreased interest in life or activities  Tamoxifen toxicities 1. Superficial phlebitis 2. Tenderness in the pectoralis muscles 3. Lack of interest in life for activities or exercise  Surveillance: 1.  Mammogram will need to be scheduled.  We will also order an ultrasound  2 breast exam: Bilateral breast tenderness 08/02/2017.   Return to clinic in 1 year for follow-up with survivorship clinic

## 2017-08-02 NOTE — Telephone Encounter (Signed)
Gave patient AVs and calendar of upcoming February 2020 appointments. °

## 2017-08-02 NOTE — Progress Notes (Signed)
Patient Care Team: Laurey Morale, MD as PCP - General  DIAGNOSIS:  Encounter Diagnoses  Name Primary?  . Malignant neoplasm of upper-outer quadrant of right breast in female, estrogen receptor positive ([place])   . Post-menopausal Yes    SUMMARY OF ONCOLOGIC HISTORY:   Breast cancer of upper-outer quadrant of right female breast ([place])   08/15/2013 Initial Diagnosis    DCIS with calcifications      08/30/2013 Surgery    Right breast lumpectomy: No evidence of residual DCIS found to have atypical lobular hyperplasia      09/26/2013 - 11/09/2013 Radiation Therapy    Adjuvant radiation therapy by Dr. Valere Dross      11/21/2013 - 08/22/2015 Anti-estrogen oral therapy    Tamoxifen 20 mg daily stopped because patient was having problems with lack of interest in life which she attributed to tamoxifen.       CHIEF COMPLIANT: Follow-up and surveillance of breast cancer  INTERVAL HISTORY: Kristina Pearson is a 62 year old with above-mentioned history of right breast DCIS who underwent lumpectomy radiation is currently off tamoxifen.  She appears to be doing quite well.  She is stop tamoxifen because she felt that it was affecting her quality of life.  REVIEW OF SYSTEMS:   Constitutional: Denies fevers, chills or abnormal weight loss Eyes: Denies blurriness of vision Ears, nose, mouth, throat, and face: Denies mucositis or sore throat Respiratory: Denies cough, dyspnea or wheezes Cardiovascular: Denies palpitation, chest discomfort Gastrointestinal:  Denies nausea, heartburn or change in bowel habits Skin: Denies abnormal skin rashes Lymphatics: Denies new lymphadenopathy or easy bruising Neurological:Denies numbness, tingling or new weaknesses Behavioral/Psych: Mood is stable, no new changes  Extremities: No lower extremity edema Breast:  denies any pain or lumps or nodules in either breasts All other systems were reviewed with the patient and are negative.  I have reviewed the past medical  history, past surgical history, social history and family history with the patient and they are unchanged from previous note.  ALLERGIES:  has No Known Allergies.  MEDICATIONS:  Current Outpatient Medications  Medication Sig Dispense Refill  . ALPRAZolam (XANAX) 0.5 MG tablet Take 1 tablet (0.5 mg total) by mouth 2 (two) times daily. 180 tablet 1  . aspirin EC 81 MG tablet Take 1 tablet (81 mg total) by mouth daily. 1 tablet 0  . celecoxib (CELEBREX) 200 MG capsule Take 1 capsule (200 mg total) by mouth 2 (two) times daily. 180 capsule 3  . Cholecalciferol (VITAMIN D PO) Take by mouth.    . cycloSPORINE (RESTASIS) 0.05 % ophthalmic emulsion Place 1 drop into both eyes 2 (two) times daily.    [place] [facility] ELDERBERRY PO Take 10 mLs by mouth daily.    [place] [facility] gabapentin (NEURONTIN) 300 MG capsule Take 1 capsule (300 mg total) by mouth at bedtime.    [place] HYDROcodone-acetaminophen (NORCO) 10-325 MG tablet Take 1 tablet by mouth every 6 (six) hours as needed for severe pain. 120 tablet 0  . Magnesium 400 MG CAPS Take by mouth daily.    . methocarbamol (ROBAXIN) 500 MG tablet TAKE 1 TABLET BY MOUTH EVERY 6 HOURS AS NEEDED FOR MUSCLE SPASMS 120 tablet 1  . Multiple Vitamin (MULTIVITAMIN) tablet Take 1 tablet by mouth daily. One a day for over age 58    . multivitamin-lutein (OCUVITE-LUTEIN) CAPS capsule Take 1 capsule by mouth daily. Reported on 06/08/2015    . NONFORMULARY OR COMPOUNDED ITEM Vit E vaginal suppositories.  200u/ml.  1 pv two to  three times weekly. 36 each 3  . PROAIR RESPICLICK 277 (90 Base) MCG/ACT AEPB daily as needed.  0  . Probiotic Product (PROBIOTIC DAILY PO) Take by mouth.    . ranitidine (ZANTAC) 300 MG tablet TAKE 1 TABLET (300 MG TOTAL) BY MOUTH 2 (TWO) TIMES DAILY. 180 tablet 3  . SYNTHROID 112 MCG tablet     . topiramate (TOPAMAX) 25 MG tablet Take 1 tablet by mouth daily as needed.    . traZODone (DESYREL) 100 MG tablet TAKE 2 TABLETS EVERY NIGHT 180 tablet 3   No current  facility-administered medications for this visit.     PHYSICAL EXAMINATION: ECOG PERFORMANCE STATUS: 1 - Symptomatic but completely ambulatory  Vitals:   08/02/17 1418  BP: 116/71  Pulse: 60  Resp: 18  Temp: 98.3 F (36.8 C)  SpO2: 98%   Filed Weights   08/02/17 1418  Weight: 150 lb 6.4 oz (68.2 kg)    GENERAL:alert, no distress and comfortable SKIN: skin color, texture, turgor are normal, no rashes or significant lesions EYES: normal, Conjunctiva are pink and non-injected, sclera clear OROPHARYNX:no exudate, no erythema and lips, buccal mucosa, and tongue normal  NECK: supple, thyroid normal size, non-tender, without nodularity LYMPH:  no palpable lymphadenopathy in the cervical, axillary or inguinal LUNGS: clear to auscultation and percussion with normal breathing effort HEART: regular rate & rhythm and no murmurs and no lower extremity edema ABDOMEN:abdomen soft, non-tender and normal bowel sounds MUSCULOSKELETAL:no cyanosis of digits and no clubbing  NEURO: alert & oriented x 3 with fluent speech, no focal motor/sensory deficits EXTREMITIES: No lower extremity edema BREAST: No palpable masses or nodules in either right or left breasts. No palpable axillary supraclavicular or infraclavicular adenopathy no breast tenderness or nipple discharge. (exam performed in the presence of a chaperone)  LABORATORY DATA:  I have reviewed the data as listed CMP Latest Ref Rng & Units 05/13/2016 10/02/2015 05/14/2015  Glucose 70 - 99 mg/dL 71 91 94  BUN 6 - 23 mg/dL 12 11 17   Creatinine 0.40 - 1.20 mg/dL 0.99 0.88 0.95  Sodium 135 - 145 mEq/L 139 138 138  Potassium 3.5 - 5.1 mEq/L 4.0 4.8 4.8  Chloride 96 - 112 mEq/L 103 104 102  CO2 19 - 32 mEq/L 28 28 29   Calcium 8.4 - 10.5 mg/dL 9.2 9.3 9.5  Total Protein 6.0 - 8.3 g/dL 6.7 6.6 -  Total Bilirubin 0.2 - 1.2 mg/dL 0.5 0.5 -  Alkaline Phos 39 - 117 U/L 30(L) 38 -  AST 0 - 37 U/L 21 23 -  ALT 0 - 35 U/L 14 21 -    Lab Results    Component Value Date   WBC 5.2 05/13/2016   HGB 12.9 05/13/2016   HCT 37.9 05/13/2016   MCV 95.4 05/13/2016   PLT 204.0 05/13/2016   NEUTROABS 3.2 05/13/2016    ASSESSMENT & PLAN:  Breast cancer of upper-outer quadrant of right female breast Right breast DCIS ER/PR positive: Patient is currently on antiestrogen therapy with tamoxifen  June 2015 -March 2017 stopped early due to lack of energy and decreased interest in life or activities  Tamoxifen toxicities 1. Superficial phlebitis 2. Tenderness in the pectoralis muscles 3. Lack of interest in life for activities or exercise  Surveillance: 1.  Mammogram will need to be scheduled.  We will also order an ultrasound  2 breast exam: Bilateral breast tenderness 08/02/2017.  We will order mammograms and bilateral breast ultrasounds because of bilateral breast tenderness.  She will also need to get a bone density test.  Return to clinic in 1 year for follow-up with survivorship clinic   I spent 25 minutes talking to the patient of which more than half was spent in counseling and coordination of care.  Orders Placed This Encounter  Procedures  . MM DIAG BREAST TOMO BILATERAL    INS-AETNA PF 08/17/16 @ BCG/RT LUMPECTOMY/NO NEEDS/TOMO/BC PT COSIGN REQ    Standing Status:   Future    Standing Expiration Date:   08/02/2018    Order Specific Question:   Reason for Exam (SYMPTOM  OR DIAGNOSIS REQUIRED)    Answer:   Annual mammogram with history of breast cancer, Left axilla tenderness, Breast pain evaluation with history of breast cancer    Order Specific Question:   Preferred imaging location?    Answer:   [facility]  . US BREAST LTD UNI RIGHT INC AXILLA    INS-AETNA PF 08/17/16 @ BCG/RT LUMPECTOMY/NO NEEDS/TOMO/BC PT COSIGN REQ    Standing Status:   Future    Standing Expiration Date:   10/01/2018    Order Specific Question:   Reason for Exam (SYMPTOM  OR DIAGNOSIS REQUIRED)    Answer:   Breast pain evaluation with history of  breast cancer    Order Specific Question:   Preferred imaging location?    Answer:   [facility]  . DG Bone Density    INS-AETNA PF 07/26/13 @ BCG/WT 150 LBS/PT INFORMED TO STOP CALC SUPP 2 DAYS PRIOR/NO NEEDS/BC PT COSIGN REQ.    Standing Status:   Future    Standing Expiration Date:   09/06/2018    Order Specific Question:   Reason for exam:    Answer:   Post menopausal    Order Specific Question:   Preferred imaging location?    Answer:   [facility]  . [place] AXILLA LEFT    INS-AETNA PF 08/17/16 @ BCG/RT LUMPECTOMY/NO NEEDS/TOMO/BC PT COSIGN REQ    Standing Status:   Future    Standing Expiration Date:   10/01/2018    Order Specific Question:   Reason for Exam (SYMPTOM  OR DIAGNOSIS REQUIRED)    Answer:   Left axilla tenderness    Order Specific Question:   Preferred imaging location?    Answer:   [facility]   The patient has a good understanding of the overall plan. she agrees with it. she will call with any problems that may develop before the next visit here.   Harriette Ohara, MD 08/02/17

## 2017-08-03 ENCOUNTER — Ambulatory Visit: Payer: 59 | Admitting: Family Medicine

## 2017-08-03 DIAGNOSIS — Z0289 Encounter for other administrative examinations: Secondary | ICD-10-CM

## 2017-08-03 NOTE — Telephone Encounter (Signed)
Fax received from [facility] stating the information for appeal was reviewed and Vyvanse 40mg  capsule was approved 1/25-2/04/2019.  I called [facility], informed Janett Billow of this and she stated they will contact the pt when the Rx is ready for pick up.

## 2017-08-13 ENCOUNTER — Other Ambulatory Visit: Payer: 59

## 2017-08-18 ENCOUNTER — Telehealth: Payer: Self-pay | Admitting: Family Medicine

## 2017-08-18 MED ORDER — ALPRAZOLAM 0.5 MG PO TABS: 0.5000 mg | ORAL_TABLET | Freq: Two times a day (BID) | ORAL | 1 refills | Status: DC

## 2017-08-18 NOTE — Telephone Encounter (Signed)
Done

## 2017-08-18 NOTE — Telephone Encounter (Signed)
XANAX Last OV: 09/22/16 w/ PCP Last filled: 09/09/16, #180, 1 RF Sig: Take 1 tablet (0.5 mg total) by mouth 2 (two) times daily. UDS: Not on file within the last year

## 2017-08-18 NOTE — Telephone Encounter (Signed)
Copied from [place]. Topic: Inquiry >> Aug 18, 2017 10:57 AM Pricilla Handler wrote: Reason for CRM: Patient called requesting that Dr. Barbie Banner Assistant Sunday Spillers call her back. Patient would not disclose what she needed to speak to [facility] regarding. Patient would not speak with a [place]. Please call patient back this morning.       Thank You!!!   I spoke with patient and she is requesting a refill for Xanax 0.5 mg take 1 po bid, send to [facility].

## 2017-09-06 ENCOUNTER — Ambulatory Visit
Admission: RE | Admit: 2017-09-06 | Discharge: 2017-09-06 | Disposition: A | Discharge status: Home / Self Care | Payer: 59 | Source: Ambulatory Visit | Attending: Hematology and Oncology | Admitting: Hematology and Oncology

## 2017-09-06 DIAGNOSIS — Z780 Asymptomatic menopausal state: Secondary | ICD-10-CM

## 2017-09-06 DIAGNOSIS — Z170 Estrogen receptor positive status [ER+]: Principal | ICD-10-CM

## 2017-09-06 DIAGNOSIS — C50411 Malignant neoplasm of upper-outer quadrant of right female breast: Secondary | ICD-10-CM

## 2017-09-06 HISTORY — DX: Personal history of irradiation: Z92.3

## 2017-09-20 ENCOUNTER — Other Ambulatory Visit: Payer: Self-pay | Admitting: Family Medicine

## 2017-09-21 NOTE — Telephone Encounter (Signed)
Last OV 09/22/2016   Last refilled 02/09/2017 disp 120 with 1 refill   Sent to PCP for approval

## 2017-09-29 ENCOUNTER — Other Ambulatory Visit: Payer: Self-pay | Admitting: Family Medicine

## 2017-09-29 ENCOUNTER — Telehealth: Payer: Self-pay | Admitting: Family Medicine

## 2017-09-29 NOTE — Telephone Encounter (Signed)
Copied from [place] [id number]. Topic: Quick Communication - Rx Refill/Question >> Sep 29, 2017  3:08 PM Boyd Kerbs wrote: Medication: [facility] 40 mg [facility] does not have this medicine and has to order,  she will be out before they get it.  [facility] [address] Has the patient contacted their pharmacy? Yes.   (Agent: If no, request that the patient contact the pharmacy   [id number] [place], [place]. AT [address]. [place] [address] Phone: [phone] Fax: [phone]  Preferred Pharmacy (with phone number or street name):  Agent: Please be advised that RX refills may take up to 3 business days. We ask that you follow-up with your pharmacy.

## 2017-09-29 NOTE — Telephone Encounter (Signed)
Copied from [place] [phone]. Topic: Quick Communication - Rx Refill/Question >> Sep 29, 2017  3:08 PM Boyd Kerbs wrote: Medication: Vyvance 40 mg [facility] does not have this medicine and has to order,  she will be out before they get it.  [facility] [address] Has the patient contacted their pharmacy? Yes.   (Agent: If no, request that the patient contact the pharmacy   [id number] [place], [place]. AT [address]. [place] [address] Phone: [phone] Fax: [phone]  y for the refill.) Preferred Pharmacy (with phone number or street name): Agent: Please be advised that RX refills may take up to 3 business days. We ask that you follow-up with your pharmacy.

## 2017-09-29 NOTE — Telephone Encounter (Signed)
[facility] and had them remove the Rx for thes month they will keep the one for next month on hold for the pt. Sent to PCP to [facility]se [facility] [address]

## 2017-09-29 NOTE — Telephone Encounter (Signed)
Copied from [place] [id number]. Topic: Quick Communication - Rx Refill/Question >> Sep 29, 2017  3:08 PM Boyd Kerbs wrote:  Medication: Vyvance 40 mg  [facility] not [facility] medicine and has to order,  she will be out before they get it.  [facility] [address]  Has the patient contacted their pharmacy? Yes.   (Agent: If no, request that the patient contact the pharmacy   [id number] [place], [place]. AT [address]. [place] [address] Phone: [phone] Fax: [phone]   Preferred Pharmacy (with phone number or street name):  Agent: Please be advised that RX refills may take up to 3 business days. We ask that you follow-up with your pharmacy.

## 2017-09-29 NOTE — Telephone Encounter (Signed)
Patient is requesting change in pharmacy.

## 2017-09-29 NOTE — Telephone Encounter (Signed)
Copied from [place] [id number]. Topic: Quick Communication - Rx Refill/Question >> Sep 29, 2017  3:08 PM Boyd Kerbs wrote: Medication: Vyvance 40 mg [facility] does not have this medicine and has to order,  she will be out before they get it.  [facility] [address] Has the patient contacted their pharmacy? Yes.   (Agent: If no, request that the patient contact the pharmacy   [id number] [place], [place]. AT [address]. [place] [address] Phone: [phone] Fax: [phone]   Preferred Pharmacy (with phone number or street name):  Agent: Please be advised that RX refills may take up to 3 business days. We ask that you follow-up with your pharmacy.

## 2017-09-29 NOTE — Telephone Encounter (Signed)
Duplicate

## 2017-10-01 MED ORDER — LISDEXAMFETAMINE DIMESYLATE 40 MG PO CAPS: 40.0000 mg | ORAL_CAPSULE | ORAL | 0 refills | Status: DC

## 2017-10-01 NOTE — Telephone Encounter (Signed)
Done

## 2017-10-28 ENCOUNTER — Encounter: Payer: Self-pay | Admitting: Family Medicine

## 2017-10-28 ENCOUNTER — Ambulatory Visit (INDEPENDENT_AMBULATORY_CARE_PROVIDER_SITE_OTHER): Payer: 59 | Admitting: Family Medicine

## 2017-10-28 VITALS — BP 118/78 | HR 68 | Temp 98.0°F | Ht 66.0 in | Wt 161.1 lb

## 2017-10-28 DIAGNOSIS — R300 Dysuria: Secondary | ICD-10-CM

## 2017-10-28 LAB — POCT URINALYSIS DIPSTICK
Bilirubin, UA: NEGATIVE
Glucose, UA: NEGATIVE
Ketones, UA: NEGATIVE
NITRITE UA: NEGATIVE
PROTEIN UA: NEGATIVE
SPEC GRAV UA: 1.01 (ref 1.010–1.025)
Urobilinogen, UA: 0.2 E.U./dL
pH, UA: 6 (ref 5.0–8.0)

## 2017-10-28 MED ORDER — NITROFURANTOIN MONOHYD MACRO 100 MG PO CAPS
100.0000 mg | ORAL_CAPSULE | Freq: Two times a day (BID) | ORAL | 0 refills | Status: DC
Start: 2017-10-28 — End: 2018-02-23

## 2017-10-28 NOTE — Progress Notes (Signed)
HPI:  Using dictation device. Unfortunately this device frequently misinterprets words/phrases.  Acute visit for Dysuria: -started 2 days ago -frequency, urgency, dysuria, chills mild, some low back pain, but has chronic back pain -no documented fever, vomiting, hematuria  ROS: See pertinent positives and negatives per HPI.  Past Medical History:  Diagnosis Date  . ADHD (attention deficit hyperactivity disorder)    ADD  . Allergy   . Arthritis    osteoarthritrs  . Breast cancer ([place]) 08/15/13   right lateral upper outer  . Fibromyalgia   . GERD (gastroesophageal reflux disease)   . Headache(784.0)   . History of depression   . Hx of radiation therapy 10/19/13- 11/09/13   right breast 4256 cGy in 16 sessions, hypo-fractionated  . Hypothyroid   . Migraine   . Osteopenia   . Personal history of radiation therapy 2015  . PONV (postoperative nausea and vomiting)   . Sialoadenitis   . Spinal stenosis   . Urinary incontinence   . Vitiligo   . Wears glasses     Past Surgical History:  Procedure Laterality Date  . BREAST BIOPSY Right 07/2013  . BREAST LUMPECTOMY Right 08/2013  . BREAST LUMPECTOMY WITH RADIOACTIVE SEED LOCALIZATION Right 08/30/2013   Procedure: RIGHT PARTIAL MASTECTOMY WITH RADIOACTIVE SEED LOCALIZATION;  Surgeon: [name], MD;  Location: [facility];  Service: General;  Laterality: Right;  . BREAST SURGERY    . CHOLECYSTECTOMY  1996   lap  . COLONOSCOPY  2007   per Dr. Cristina Gong, clear, repeat in 10 yrs  . DIAGNOSTIC LAPAROSCOPY  1982   exp  . LAPAROSCOPIC OVARIAN CYSTECTOMY    . URETHRAL DILATION      Family History  Problem Relation Age of Onset  . Arthritis Mother   . Diabetes Mother   . Osteoporosis Mother   . Polymyalgia rheumatica Mother        on prednisone  . Heart disease Mother   . Hyperlipidemia Mother   . Hypertension Mother   . Stroke Mother   . Heart attack Father   . Heart disease Father   . Colon cancer  Maternal Uncle        dx in his 36s  . Colon cancer Cousin 83       mother's maternal first cousin  . Breast cancer Cousin 77       BRCA negative; father's maternal first cousin  . Leukemia Maternal Uncle 81       AML  . Melanoma Maternal Uncle 73  . Leukemia Maternal Uncle 66  . Ovarian cancer Cousin 19       maternal cousin  . Ovarian cancer Cousin 24       maternal cousin  . Leukemia Cousin        dx in his 23s; maternal cousin; thought to be the result of taking [place]  . Heart attack Paternal Uncle   . Colon cancer Cousin 52       maternal second cousin  . Breast cancer Other        maternal great aunt dx in her 52s  . Ovarian cancer Other        maternal grandfather's sister dx in her 67s  . Colon cancer Other        maternal grandfather's sister dx in her 57s  . Colon cancer Other        maternal grandfather's sister dx in her 40s  . Other Daughter  dermoid tumor  . Diabetes Paternal Grandmother   . Skin cancer Paternal Grandmother   . Hypertension Paternal Grandmother     SOCIAL HX: see hpi   Current Outpatient Medications:  .  ALPRAZolam (XANAX) 0.5 MG tablet, Take 1 tablet (0.5 mg total) by mouth 2 (two) times daily., Disp: 180 tablet, Rfl: 1 .  aspirin EC 81 MG tablet, Take 1 tablet (81 mg total) by mouth daily., Disp: 1 tablet, Rfl: 0 .  celecoxib (CELEBREX) 200 MG capsule, Take 1 capsule (200 mg total) by mouth 2 (two) times daily., Disp: 180 capsule, Rfl: 3 .  Cholecalciferol (VITAMIN D PO), Take by mouth., Disp: , Rfl:  .  cycloSPORINE (RESTASIS) 0.05 % ophthalmic emulsion, Place 1 drop into both eyes 2 (two) times daily., Disp: , Rfl:  .  ELDERBERRY PO, Take 10 mLs by mouth daily., Disp: , Rfl:  .  gabapentin (NEURONTIN) 300 MG capsule, Take 1 capsule (300 mg total) by mouth at bedtime., Disp: , Rfl:  .  HYDROcodone-acetaminophen (NORCO) 10-325 MG tablet, Take 1 tablet by mouth every 6 (six) hours as needed for severe pain., Disp: 120 tablet, Rfl:  0 .  lisdexamfetamine (VYVANSE) 40 MG capsule, Take 1 capsule (40 mg total) by mouth every morning., Disp: 30 capsule, Rfl: 0 .  Magnesium 400 MG CAPS, Take by mouth daily., Disp: , Rfl:  .  methocarbamol (ROBAXIN) 500 MG tablet, TAKE 1 TABLET BY MOUTH EVERY 6 HOURS AS NEEDED FOR MUSCLE SPASMS, Disp: 120 tablet, Rfl: 5 .  Multiple Vitamin (MULTIVITAMIN) tablet, Take 1 tablet by mouth daily. One a day for over age 13, Disp: , Rfl:  .  multivitamin-lutein (OCUVITE-LUTEIN) CAPS capsule, Take 1 capsule by mouth daily. Reported on 06/08/2015, Disp: , Rfl:  .  NONFORMULARY OR COMPOUNDED ITEM, Vit E vaginal suppositories.  200u/ml.  1 pv two to three times weekly., Disp: 36 each, Rfl: 3 .  PROAIR RESPICLICK 850 (90 Base) MCG/ACT AEPB, daily as needed., Disp: , Rfl: 0 .  Probiotic Product (PROBIOTIC DAILY PO), Take by mouth., Disp: , Rfl:  .  ranitidine (ZANTAC) 300 MG tablet, TAKE 1 TABLET (300 MG TOTAL) BY MOUTH 2 (TWO) TIMES DAILY., Disp: 180 tablet, Rfl: 3 .  SYNTHROID 112 MCG tablet, , Disp: , Rfl:  .  traZODone (DESYREL) 100 MG tablet, TAKE 2 TABLETS EVERY NIGHT, Disp: 180 tablet, Rfl: 3 .  nitrofurantoin, macrocrystal-monohydrate, (MACROBID) 100 MG capsule, Take 1 capsule (100 mg total) by mouth 2 (two) times daily., Disp: 14 capsule, Rfl: 0  EXAM:  Vitals:   10/28/17 1632  BP: 118/78  Pulse: 68  Temp: 98 F (36.7 C)    Body mass index is 26 kg/m.  GENERAL: vitals reviewed and listed above, alert, oriented, appears well hydrated and in no acute distress  HEENT: atraumatic, conjunttiva clear, no obvious abnormalities on inspection of external nose and ears  NECK: no obvious masses on inspection  LUNGS: clear to auscultation bilaterally, no wheezes, rales or rhonchi, good air movement  CV: HRRR, no peripheral edema  ABD: soft, NTTP except mild TTP over bladder, no CVA TTP  MS: moves all extremities without noticeable abnormality  PSYCH: pleasant and cooperative, no obvious  depression or anxiety  ASSESSMENT AND PLAN:  Discussed the following assessment and plan:  Dysuria - Plan: POC Urinalysis Dipstick  -udip + symptoms c/w UTI -she reports has taken Macrobid before without issues - rx sent -culture pending -Patient advised to return or notify a doctor immediately  if symptoms worsen or persist or new concerns arise.  Patient Instructions  Take the antibiotic as prescribed  I hope you are feeling better soon! Seek care promptly if your symptoms worsen, new concerns arise or you are not improving with treatment.     Lucretia Kern, DO

## 2017-10-28 NOTE — Addendum Note (Signed)
Addended by: Lahoma Crocker A on: 10/28/2017 05:00 PM   Modules accepted: Orders

## 2017-10-28 NOTE — Patient Instructions (Signed)
Take the antibiotic as prescribed  I hope you are feeling better soon! Seek care promptly if your symptoms worsen, new concerns arise or you are not improving with treatment.

## 2017-10-31 LAB — URINE CULTURE
MICRO NUMBER: 90567612
SPECIMEN QUALITY:: ADEQUATE

## 2017-11-18 ENCOUNTER — Telehealth: Payer: Self-pay | Admitting: Obstetrics & Gynecology

## 2017-11-18 NOTE — Telephone Encounter (Signed)
Left patient a message to call back when ready to reschedule, canceled by automated reminder call. °

## 2017-11-22 ENCOUNTER — Ambulatory Visit: Payer: 59 | Admitting: Obstetrics & Gynecology

## 2018-01-10 ENCOUNTER — Encounter: Payer: Self-pay | Admitting: Family Medicine

## 2018-01-10 ENCOUNTER — Ambulatory Visit (INDEPENDENT_AMBULATORY_CARE_PROVIDER_SITE_OTHER): Payer: 59 | Admitting: Family Medicine

## 2018-01-10 VITALS — BP 116/84 | HR 67 | Temp 97.8°F | Ht 66.0 in | Wt 156.6 lb

## 2018-01-10 DIAGNOSIS — R300 Dysuria: Secondary | ICD-10-CM

## 2018-01-10 DIAGNOSIS — N3000 Acute cystitis without hematuria: Secondary | ICD-10-CM

## 2018-01-10 LAB — POCT URINALYSIS DIPSTICK
BILIRUBIN UA: NEGATIVE
Glucose, UA: NEGATIVE
Ketones, UA: NEGATIVE
NITRITE UA: NEGATIVE
PH UA: 7.5 (ref 5.0–8.0)
PROTEIN UA: NEGATIVE
UROBILINOGEN UA: 0.2 U/dL

## 2018-01-10 MED ORDER — NITROFURANTOIN MONOHYD MACRO 100 MG PO CAPS: 100.0000 mg | ORAL_CAPSULE | Freq: Two times a day (BID) | ORAL | 0 refills | Status: DC

## 2018-01-10 MED ORDER — PHENAZOPYRIDINE HCL 100 MG PO TABS: 100.0000 mg | ORAL_TABLET | Freq: Three times a day (TID) | ORAL | 0 refills | Status: DC | PRN

## 2018-01-10 NOTE — Progress Notes (Signed)
Patient: Kristina Pearson MRN: [id number] DOB: [birth year] PCP: Laurey Morale, MD     Subjective:  Chief Complaint  Patient presents with  . Urinary Tract Infection  . Urinary Frequency    HPI: The patient is a 62 y.o. female who presents today for dysuria, burning, pelvic pressure. She first had symptoms when she woke up. She states if she goes in the hot tub at the gym she will get a UTI. She got in the hot tub yesterday due to her hip hurting and symptoms started this AM. She has dysuria, frequency, urgency and pelvic pain. She has no hematuria. She has no flank pain. Does feel nauseated, but no vomiting. Took one dose of macrobid this AM that she had left over.   Review of Systems  Constitutional: Positive for chills and fatigue. Negative for fever.  Respiratory: Negative for shortness of breath.   Cardiovascular: Negative for chest pain.  Gastrointestinal: Positive for nausea. Negative for abdominal pain and vomiting.  Genitourinary: Positive for dysuria, frequency, pelvic pain and urgency. Negative for difficulty urinating and flank pain.    Allergies Patient has No Known Allergies.  Past Medical History Patient  has a past medical history of ADHD (attention deficit hyperactivity disorder), Allergy, Arthritis, Breast cancer ([place]) (08/15/13), Fibromyalgia, GERD (gastroesophageal reflux disease), Headache(784.0), History of depression, radiation therapy (10/19/13- 11/09/13), Hypothyroid, Migraine, Osteopenia, Personal history of radiation therapy (2015), PONV (postoperative nausea and vomiting), Sialoadenitis, Spinal stenosis, Urinary incontinence, Vitiligo, and Wears glasses.  Surgical History Patient  has a past surgical history that includes Diagnostic laparoscopy (1982); Cholecystectomy (1996); Breast lumpectomy with radioactive seed localization (Right, 08/30/2013); Colonoscopy (2007); Laparoscopic ovarian cystectomy; Urethral dilation; Breast surgery; Breast lumpectomy (Right,  08/2013); and Breast biopsy (Right, 07/2013).  Family History Pateint's family history includes Arthritis in her mother; Breast cancer in her other; Breast cancer (age of onset: 33) in her cousin; Colon cancer in her maternal uncle, other, and other; Colon cancer (age of onset: 73) in her cousin; Colon cancer (age of onset: 34) in her cousin; Diabetes in her mother and paternal grandmother; Heart attack in her father and paternal uncle; Heart disease in her father and mother; Hyperlipidemia in her mother; Hypertension in her mother and paternal grandmother; Leukemia in her cousin; Leukemia (age of onset: 30) in her maternal uncle; Leukemia (age of onset: 66) in her maternal uncle; Melanoma (age of onset: 71) in her maternal uncle; Osteoporosis in her mother; Other in her daughter; Ovarian cancer in her other; Ovarian cancer (age of onset: 36) in her cousin; Ovarian cancer (age of onset: 36) in her cousin; Polymyalgia rheumatica in her mother; Skin cancer in her paternal grandmother; Stroke in her mother.  Social History Patient  reports that she quit smoking about 39 years ago. She has never used smokeless tobacco. She reports that she drinks about 0.6 - 1.2 oz of alcohol per week. She reports that she does not use drugs.    Objective: Vitals:   01/10/18 1307  BP: 116/84  Pulse: 67  Temp: 97.8 F (36.6 C)  TempSrc: Oral  SpO2: 98%  Weight: 156 lb 9.6 oz (71 kg)  Height: 5\' 6"  (1.676 m)    Body mass index is 25.28 kg/m.  Physical Exam  Constitutional: She is oriented to person, place, and time. She appears well-developed and well-nourished.  Cardiovascular: Normal rate, regular rhythm and normal heart sounds.  Pulmonary/Chest: Effort normal and breath sounds normal.  Abdominal: Soft. Bowel sounds are normal. There is tenderness (suprapubic).  Musculoskeletal:  No CVA tenderness   Neurological: She is alert and oriented to person, place, and time.  Vitals reviewed.       Assessment/plan: 1. Acute cystitis without hematuria Already had one dose of macrobid and hopefully won't mess up culture. Will do course of macrobid since sensitive last culture and tolerates fine. She knows what causes them and plans to not get into hot tub again at gym. Pyridium prn for pain. Continue with fluids. Precautions given for fever/vomting, flank pain to go to ER.  - POCT Urinalysis Dipstick - Urine Culture; Future - Urine Culture    Return if symptoms worsen or fail to improve.   Orma Flaming, MD [place]   01/10/2018

## 2018-01-12 LAB — URINE CULTURE
MICRO NUMBER: 90864414
SPECIMEN QUALITY: ADEQUATE

## 2018-01-13 ENCOUNTER — Encounter: Payer: Self-pay | Admitting: Family Medicine

## 2018-01-14 NOTE — Progress Notes (Unsigned)
Patient sent MyChart message to report that she is feeling better on antibotics for her UTI.

## 2018-01-19 ENCOUNTER — Telehealth: Payer: Self-pay

## 2018-01-19 NOTE — Telephone Encounter (Signed)
Refill request for Vyvanse 40 MG   Last OV 07/28/2017   Last refilled 10/01/2017 disp 30 with no refills   Sent to PCP to advise

## 2018-01-20 NOTE — Telephone Encounter (Signed)
Patient called and states she is going out of town around noon tomorrow and she only have 2 tablets left. She is wondering if she can get that filled today.

## 2018-01-20 NOTE — Telephone Encounter (Signed)
Called and spoke to patient, she is aware that Dr. Sarajane Jews is out of the office until tomorrow

## 2018-01-21 MED ORDER — LISDEXAMFETAMINE DIMESYLATE 40 MG PO CAPS: 40.0000 mg | ORAL_CAPSULE | ORAL | 0 refills | Status: DC

## 2018-01-21 NOTE — Telephone Encounter (Signed)
Done

## 2018-02-18 ENCOUNTER — Encounter: Payer: Self-pay | Admitting: Internal Medicine

## 2018-02-18 ENCOUNTER — Ambulatory Visit (INDEPENDENT_AMBULATORY_CARE_PROVIDER_SITE_OTHER): Payer: 59 | Admitting: Internal Medicine

## 2018-02-18 VITALS — BP 112/60 | HR 82 | Temp 98.5°F | Wt 158.4 lb

## 2018-02-18 DIAGNOSIS — R300 Dysuria: Secondary | ICD-10-CM

## 2018-02-18 DIAGNOSIS — N951 Menopausal and female climacteric states: Secondary | ICD-10-CM

## 2018-02-18 DIAGNOSIS — R350 Frequency of micturition: Secondary | ICD-10-CM

## 2018-02-18 LAB — POCT URINALYSIS DIPSTICK
BILIRUBIN UA: NEGATIVE
Glucose, UA: NEGATIVE
KETONES UA: NEGATIVE
Leukocytes, UA: NEGATIVE
NITRITE UA: NEGATIVE
PH UA: 8 (ref 5.0–8.0)
PROTEIN UA: NEGATIVE
RBC UA: NEGATIVE
Spec Grav, UA: 1.01 (ref 1.010–1.025)
UROBILINOGEN UA: 0.2 U/dL

## 2018-02-18 NOTE — Patient Instructions (Addendum)
Will  Be contacted   When culture back     Atrophic Vaginitis Atrophic vaginitis is when the tissues that line the vagina become dry and thin. This is caused by a drop in estrogen. Estrogen helps:  To keep the vagina moist.  To make a clear fluid that helps: ? To lubricate the vagina for sex. ? To protect the vagina from infection.  If the lining of the vagina is dry and thin, it may:  Make sex painful. It may also cause bleeding.  Cause a feeling of: ? Burning. ? Irritation. ? Itchiness.  Make an exam of your vagina painful. It may also cause bleeding.  Make you lose interest in sex.  Cause a burning feeling when you pee.  Make your vaginal fluid (discharge) brown or yellow.  For some women, there are no symptoms. This condition is most common in women who do not get their regular menstrual periods anymore (menopause). This often starts when a woman is 51-57 years old. Follow these instructions at home:  Take medicines only as told by your doctor. Do not use any herbal or alternative medicines unless your doctor says it is okay.  Use over-the-counter products for dryness only as told by your doctor. These include: ? Creams. ? Lubricants. ? Moisturizers.  Do not douche.  Do not use products that can make your vagina dry. These include: ? Scented feminine sprays. ? Scented tampons. ? Scented soaps.  If it hurts to have sex, tell your sexual partner. Contact a doctor if:  Your discharge looks different than normal.  Your vagina has an unusual smell.  You have new symptoms.  Your symptoms do not get better with treatment.  Your symptoms get worse. This information is not intended to replace advice given to you by your health care provider. Make sure you discuss any questions you have with your health care provider. Document Released: 11/25/2007 Document Revised: 11/14/2015 Document Reviewed: 05/30/2014 Elsevier Interactive Patient Education  [employer].

## 2018-02-18 NOTE — Progress Notes (Signed)
Chief Complaint  Patient presents with  . Urinary Frequency    Frequency x 2-3 days - worsening.  Some right flank/back pain. Does not feel well. Denies pain with urination. Pt has changed diet while trying to figure out if UTI related.    HPI: Kristina Pearson 62 y.o.  sda   cpc na   Avoiding  Certain  Dietary  factos causing urinary  sx  But having  Frequency and discomfort not as bad as in past with uti but some right back pain flank  Not sure if from her back and arthritis situation or worse . No fever chills feeling  Hx  Of utis   Has dyspaeureunia also has fu appt gyne next month asks for advice  Has been on   Tamoxifen in past  ROS: See pertinent positives and negatives per HPI.  Past Medical History:  Diagnosis Date  . ADHD (attention deficit hyperactivity disorder)    ADD  . Allergy   . Arthritis    osteoarthritrs  . Breast cancer ([place]) 08/15/13   right lateral upper outer  . Fibromyalgia   . GERD (gastroesophageal reflux disease)   . Headache(784.0)   . History of depression   . Hx of radiation therapy 10/19/13- 11/09/13   right breast 4256 cGy in 16 sessions, hypo-fractionated  . Hypothyroid   . Migraine   . Osteopenia   . Personal history of radiation therapy 2015  . PONV (postoperative nausea and vomiting)   . Sialoadenitis   . Spinal stenosis   . Urinary incontinence   . Vitiligo   . Wears glasses     Family History  Problem Relation Age of Onset  . Arthritis Mother   . Diabetes Mother   . Osteoporosis Mother   . Polymyalgia rheumatica Mother        on prednisone  . Heart disease Mother   . Hyperlipidemia Mother   . Hypertension Mother   . Stroke Mother   . Heart attack Father   . Heart disease Father   . Colon cancer Maternal Uncle        dx in his 47s  . Colon cancer Cousin 47       mother's maternal first cousin  . Breast cancer Cousin 53       BRCA negative; father's maternal first cousin  . Leukemia Maternal Uncle 81       AML  .  Melanoma Maternal Uncle 73  . Leukemia Maternal Uncle 74  . Ovarian cancer Cousin 36       maternal cousin  . Ovarian cancer Cousin 24       maternal cousin  . Leukemia Cousin        dx in his 27s; maternal cousin; thought to be the result of taking [place]  . Heart attack Paternal Uncle   . Colon cancer Cousin 81       maternal second cousin  . Breast cancer Other        maternal great aunt dx in her 71s  . Ovarian cancer Other        maternal grandfather's sister dx in her 80s  . Colon cancer Other        maternal grandfather's sister dx in her 5s  . Colon cancer Other        maternal grandfather's sister dx in her 73s  . Other Daughter        dermoid tumor  . Diabetes Paternal Grandmother   .  Skin cancer Paternal Grandmother   . Hypertension Paternal Grandmother     Social History   Socioeconomic History  . Marital status: Married    Spouse name: Not on file  . Number of children: 1  . Years of education: Not on file  . Highest education level: Not on file  Occupational History  . Not on file  Social Needs  . Financial resource strain: Not on file  . Food insecurity:    Worry: Not on file    Inability: Not on file  . Transportation needs:    Medical: Not on file    Non-medical: Not on file  Tobacco Use  . Smoking status: Former Smoker    Last attempt to quit: 09/14/1978    Years since quitting: 39.4  . Smokeless tobacco: Never Used  . Tobacco comment: short time in college  Substance and Sexual Activity  . Alcohol use: Yes    Alcohol/week: 1.0 - 2.0 standard drinks    Types: 1 - 2 Standard drinks or equivalent per week  . Drug use: No  . Sexual activity: Yes    Partners: Male    Birth control/protection: Post-menopausal    Comment: menarche age 54, first live birth age 32, menopause age 22, no HRT  Lifestyle  . Physical activity:    Days per week: Not on file    Minutes per session: Not on file  . Stress: Not on file  Relationships  . Social  connections:    Talks on phone: Not on file    Gets together: Not on file    Attends religious service: Not on file    Active member of club or organization: Not on file    Attends meetings of clubs or organizations: Not on file    Relationship status: Not on file  Other Topics Concern  . Not on file  Social History Narrative   Lives with husband, daughter and mother in a 2 story home.  Has one child.  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  Education: college degree    Outpatient Medications Prior to Visit  Medication Sig Dispense Refill  . ALPRAZolam (XANAX) 0.5 MG tablet Take 1 tablet (0.5 mg total) by mouth 2 (two) times daily. 180 tablet 1  . aspirin EC 81 MG tablet Take 1 tablet (81 mg total) by mouth daily. 1 tablet 0  . cycloSPORINE (RESTASIS) 0.05 % ophthalmic emulsion Place 1 drop into both eyes 2 (two) times daily.    Marland Kitchen ELDERBERRY PO Take 10 mLs by mouth daily.    Marland Kitchen gabapentin (NEURONTIN) 300 MG capsule Take 1 capsule (300 mg total) by mouth at bedtime.    Marland Kitchen HYDROcodone-acetaminophen (NORCO) 10-325 MG tablet Take 1 tablet by mouth every 6 (six) hours as needed for severe pain. 120 tablet 0  . [START ON 03/23/2018] lisdexamfetamine (VYVANSE) 40 MG capsule Take 1 capsule (40 mg total) by mouth every morning. 30 capsule 0  . Magnesium 400 MG CAPS Take by mouth daily.    . methocarbamol (ROBAXIN) 500 MG tablet TAKE 1 TABLET BY MOUTH EVERY 6 HOURS AS NEEDED FOR MUSCLE SPASMS 120 tablet 5  . Multiple Vitamin (MULTIVITAMIN) tablet Take 1 tablet by mouth daily. One a day for over age 50    . multivitamin-lutein (OCUVITE-LUTEIN) CAPS capsule Take 1 capsule by mouth daily. Reported on 06/08/2015    . nitrofurantoin (MACRODANTIN) 100 MG capsule Take 100 mg by mouth 4 (four) times daily.    Marland Kitchen  nitrofurantoin, macrocrystal-monohydrate, (MACROBID) 100 MG capsule Take 1 capsule (100 mg total) by mouth 2 (two) times daily. 14 capsule 0  . nitrofurantoin, macrocrystal-monohydrate,  (MACROBID) 100 MG capsule Take 1 capsule (100 mg total) by mouth 2 (two) times daily. 14 capsule 0  . NONFORMULARY OR COMPOUNDED ITEM Vit E vaginal suppositories.  200u/ml.  1 pv two to three times weekly. 36 each 3  . phenazopyridine (PYRIDIUM) 100 MG tablet Take 1 tablet (100 mg total) by mouth 3 (three) times daily as needed for pain. 10 tablet 0  . PROAIR RESPICLICK 035 (90 Base) MCG/ACT AEPB daily as needed.  0  . Probiotic Product (PROBIOTIC DAILY PO) Take by mouth.    . ranitidine (ZANTAC) 300 MG tablet TAKE 1 TABLET (300 MG TOTAL) BY MOUTH 2 (TWO) TIMES DAILY. 180 tablet 3  . SYNTHROID 112 MCG tablet     . traZODone (DESYREL) 100 MG tablet TAKE 2 TABLETS EVERY NIGHT 180 tablet 3  . celecoxib (CELEBREX) 200 MG capsule Take 1 capsule (200 mg total) by mouth 2 (two) times daily. (Patient not taking: Reported on 02/18/2018) 180 capsule 3  . Cholecalciferol (VITAMIN D PO) Take by mouth.     No facility-administered medications prior to visit.      EXAM:  BP 112/60 (BP Location: Right Arm, Patient Position: Sitting, Cuff Size: Normal)   Pulse 82   Temp 98.5 F (36.9 C) (Oral)   Wt 158 lb 6.4 oz (71.8 kg)   LMP 09/21/2006   BMI 25.57 kg/m   Body mass index is 25.57 kg/m.  GENERAL: vitals reviewed and listed above, alert, oriented, appears well hydrated and in no acute distress HEENT: atraumatic, conjunctiva  clear, no obvious abnormalities on inspection of external nose and ears  NECK: no obvious masses on inspection palpation   CV: HRRR, no clubbing cyanosis or  peripheral edema nl cap refill  Abdomen:  Sof,t normal bowel sounds without hepatosplenomegaly, no guarding rebound or masses no CVA tenderness  Points to right back pain area but no sig  Tenderness  MS: moves all extremities without noticeable focal  abnormality PSYCH: pleasant and cooperative, no obvious depression or anxiety ua clear   Last e coli r  5 19 and  July no sig pathogen growth ASSESSMENT AND  PLAN:  Discussed the following assessment and plan:  Dysuria - Plan: POC Urinalysis Dipstick, Urine Culture  Urinary frequency - Plan: Urine Culture  Vaginal dryness, menopausal Hs of utis  This is provably not one but reasonable to culture   ( holiday weekend)  Can discuss  with gyne about  Vag dryness  otc astrogide et  And consideration if appropriate topical estrogen etc  -Patient advised to return or notify health care team  if symptoms worsen ,persist or new concerns arise.  Patient Instructions  Will  Be contacted   When culture back     Atrophic Vaginitis Atrophic vaginitis is when the tissues that line the vagina become dry and thin. This is caused by a drop in estrogen. Estrogen helps:  To keep the vagina moist.  To make a clear fluid that helps: ? To lubricate the vagina for sex. ? To protect the vagina from infection.  If the lining of the vagina is dry and thin, it may:  Make sex painful. It may also cause bleeding.  Cause a feeling of: ? Burning. ? Irritation. ? Itchiness.  Make an exam of your vagina painful. It may also cause bleeding.  Make you  lose interest in sex.  Cause a burning feeling when you pee.  Make your vaginal fluid (discharge) brown or yellow.  For some women, there are no symptoms. This condition is most common in women who do not get their regular menstrual periods anymore (menopause). This often starts when a woman is 37-72 years old. Follow these instructions at home:  Take medicines only as told by your doctor. Do not use any herbal or alternative medicines unless your doctor says it is okay.  Use over-the-counter products for dryness only as told by your doctor. These include: ? Creams. ? Lubricants. ? Moisturizers.  Do not douche.  Do not use products that can make your vagina dry. These include: ? Scented feminine sprays. ? Scented tampons. ? Scented soaps.  If it hurts to have sex, tell your sexual partner. Contact  a doctor if:  Your discharge looks different than normal.  Your vagina has an unusual smell.  You have new symptoms.  Your symptoms do not get better with treatment.  Your symptoms get worse. This information is not intended to replace advice given to you by your health care provider. Make sure you discuss any questions you have with your health care provider. Document Released: 11/25/2007 Document Revised: 11/14/2015 Document Reviewed: 05/30/2014 Elsevier Interactive Patient Education  2018 [place]. Panosh M.D.

## 2018-02-20 LAB — URINE CULTURE
MICRO NUMBER:: 91041653
Result:: NO GROWTH
SPECIMEN QUALITY: ADEQUATE

## 2018-02-23 ENCOUNTER — Ambulatory Visit (INDEPENDENT_AMBULATORY_CARE_PROVIDER_SITE_OTHER): Payer: 59 | Admitting: Family Medicine

## 2018-02-23 ENCOUNTER — Encounter: Payer: Self-pay | Admitting: Family Medicine

## 2018-02-23 VITALS — BP 98/66 | HR 78 | Temp 98.3°F | Ht 66.5 in | Wt 158.2 lb

## 2018-02-23 DIAGNOSIS — R9439 Abnormal result of other cardiovascular function study: Secondary | ICD-10-CM

## 2018-02-23 DIAGNOSIS — Z0000 Encounter for general adult medical examination without abnormal findings: Secondary | ICD-10-CM

## 2018-02-23 DIAGNOSIS — Z8249 Family history of ischemic heart disease and other diseases of the circulatory system: Secondary | ICD-10-CM

## 2018-02-23 MED ORDER — CELECOXIB 100 MG PO CAPS: 100.0000 mg | ORAL_CAPSULE | Freq: Two times a day (BID) | ORAL | 3 refills | Status: DC

## 2018-02-23 NOTE — Progress Notes (Signed)
   Subjective:    Patient ID: Kristina Pearson, female    DOB: [birth year], 62 y.o.   MRN: [id number]  HPI Here for a well exam. She feels fine. She does ask for a refill on Celebrex to use as needed for joint pains. She is past due for a colonoscopy. She was seen last week for dysuria and the UA and culture were negative. She thinks this was the result of drinking sodas, so she has stopped that now. The dysuria has resolved. She sees Dr. Chalmers Cater for her thyroid issues. Her DEXA in March showed stable osteopenia. She takes daily calcium and vitamin D. She asks about a stress test because both of her parents have coronary artery disease.    Review of Systems  Constitutional: Negative.   HENT: Negative.   Eyes: Negative.   Respiratory: Negative.   Cardiovascular: Negative.   Gastrointestinal: Negative.   Genitourinary: Negative for decreased urine volume, difficulty urinating, dyspareunia, dysuria, enuresis, flank pain, frequency, hematuria, pelvic pain and urgency.  Musculoskeletal: Negative.   Skin: Negative.   Neurological: Negative.   Psychiatric/Behavioral: Negative.        Objective:   Physical Exam  Constitutional: She is oriented to person, place, and time. She appears well-developed and well-nourished. No distress.  HENT:  Head: Normocephalic and atraumatic.  Right Ear: External ear normal.  Left Ear: External ear normal.  Nose: Nose normal.  Mouth/Throat: Oropharynx is clear and moist. No oropharyngeal exudate.  Eyes: Pupils are equal, round, and reactive to light. Conjunctivae and EOM are normal. No scleral icterus.  Neck: Normal range of motion. Neck supple. No JVD present. No thyromegaly present.  Cardiovascular: Normal rate, regular rhythm, normal heart sounds and intact distal pulses. Exam reveals no gallop and no friction rub.  No murmur heard. Pulmonary/Chest: Effort normal and breath sounds normal. No respiratory distress. She has no wheezes. She has no rales. She exhibits  no tenderness.  Abdominal: Soft. Bowel sounds are normal. She exhibits no distension and no mass. There is no tenderness. There is no rebound and no guarding.  Musculoskeletal: Normal range of motion. She exhibits no edema or tenderness.  Lymphadenopathy:    She has no cervical adenopathy.  Neurological: She is alert and oriented to person, place, and time. She has normal reflexes. She displays normal reflexes. No cranial nerve deficit. She exhibits normal muscle tone. Coordination normal.  Skin: Skin is warm and dry. No rash noted. No erythema.  Psychiatric: She has a normal mood and affect. Her behavior is normal. Judgment and thought content normal.          Assessment & Plan:  Well exam. We discussed diet and exercise. Set up fasting labs soon. We will set up a treadmill test for her. She will contact Dr. Osborn Coho office to set up another colonoscopy.  [name], MD

## 2018-02-24 ENCOUNTER — Ambulatory Visit: Payer: 59

## 2018-02-28 ENCOUNTER — Telehealth: Payer: Self-pay

## 2018-02-28 ENCOUNTER — Telehealth: Payer: Self-pay | Admitting: *Deleted

## 2018-02-28 NOTE — Telephone Encounter (Signed)
Copied from [place]. Topic: General - Other >> Feb 28, 2018  3:37 PM Carolyn Stare wrote: [facility] call to say Celebrex is not on the pt plan.     Which ever the doctor chooses it can hust be e scribe no call back is necessary. Doctor Can do a PA if doctor would like pt to stay on the med and can do so by calling [phone] ref [id number]  These are her alternative   ibuprofen,naproxen, meloxicam , etodolac

## 2018-02-28 NOTE — Telephone Encounter (Signed)
Patient scheduled nurse visit 03/01/18 for flu and pneumococcal vaccines. I do not see any indication for pneumococcal vaccine, unless her hx of breat CA would make her candidate for PCV 13 followed by PPSV 23. Please advise request. Thanks!

## 2018-03-01 ENCOUNTER — Ambulatory Visit: Payer: 59

## 2018-03-01 MED ORDER — MELOXICAM 15 MG PO TABS: 15.0000 mg | ORAL_TABLET | Freq: Every day | ORAL | 3 refills | Status: DC

## 2018-03-01 NOTE — Telephone Encounter (Signed)
Give her the flu shot please. We should hold off on pneumonia vaccines until she is 89

## 2018-03-01 NOTE — Telephone Encounter (Signed)
Lets try Meloxicam 15 mg daily  instead ( it is in the same drug class). [facility] will [facility] to [facility]

## 2018-03-01 NOTE — Telephone Encounter (Signed)
Do you want to try a PA or resend in another medication option?

## 2018-03-02 NOTE — Telephone Encounter (Signed)
Called pt to advise but mailbox was full. Will call back later.

## 2018-03-02 NOTE — Telephone Encounter (Signed)
Called and spoke with pt. Pt would like to stay on the celexa will contact her insurance company to see what can be done bc it has been covered before in the past.   Pt stated that she will call back to let us know what she would like for [place] to do.

## 2018-03-04 NOTE — Telephone Encounter (Signed)
Waiting to hear back from patient

## 2018-03-17 ENCOUNTER — Ambulatory Visit (INDEPENDENT_AMBULATORY_CARE_PROVIDER_SITE_OTHER): Payer: 59

## 2018-03-17 DIAGNOSIS — Z8249 Family history of ischemic heart disease and other diseases of the circulatory system: Secondary | ICD-10-CM | POA: Diagnosis not present

## 2018-03-17 LAB — EXERCISE TOLERANCE TEST
CHL CUP MPHR: 158 {beats}/min
CHL CUP RESTING HR STRESS: 76 {beats}/min
CHL RATE OF PERCEIVED EXERTION: 15
CSEPEDS: 0 s
CSEPEW: 10.1 METS
Exercise duration (min): 9 min
Peak HR: 160 {beats}/min
Percent HR: 101 %

## 2018-03-18 ENCOUNTER — Other Ambulatory Visit (INDEPENDENT_AMBULATORY_CARE_PROVIDER_SITE_OTHER): Payer: 59

## 2018-03-18 DIAGNOSIS — Z0000 Encounter for general adult medical examination without abnormal findings: Secondary | ICD-10-CM | POA: Diagnosis not present

## 2018-03-18 LAB — CBC WITH DIFFERENTIAL/PLATELET
Basophils Absolute: 0.1 10*3/uL (ref 0.0–0.1)
Basophils Relative: 1.2 % (ref 0.0–3.0)
EOS PCT: 4.4 % (ref 0.0–5.0)
Eosinophils Absolute: 0.2 10*3/uL (ref 0.0–0.7)
HCT: 39.1 % (ref 36.0–46.0)
Hemoglobin: 13.3 g/dL (ref 12.0–15.0)
LYMPHS ABS: 1.4 10*3/uL (ref 0.7–4.0)
Lymphocytes Relative: 29 % (ref 12.0–46.0)
MCHC: 34 g/dL (ref 30.0–36.0)
MCV: 96.8 fl (ref 78.0–100.0)
MONO ABS: 0.4 10*3/uL (ref 0.1–1.0)
Monocytes Relative: 8.8 % (ref 3.0–12.0)
NEUTROS ABS: 2.7 10*3/uL (ref 1.4–7.7)
NEUTROS PCT: 56.6 % (ref 43.0–77.0)
PLATELETS: 227 10*3/uL (ref 150.0–400.0)
RBC: 4.04 Mil/uL (ref 3.87–5.11)
RDW: 12.8 % (ref 11.5–15.5)
WBC: 4.7 10*3/uL (ref 4.0–10.5)

## 2018-03-18 LAB — HEPATIC FUNCTION PANEL
ALBUMIN: 4.2 g/dL (ref 3.5–5.2)
ALT: 12 U/L (ref 0–35)
AST: 22 U/L (ref 0–37)
Alkaline Phosphatase: 47 U/L (ref 39–117)
Bilirubin, Direct: 0.1 mg/dL (ref 0.0–0.3)
Total Bilirubin: 0.6 mg/dL (ref 0.2–1.2)
Total Protein: 6.6 g/dL (ref 6.0–8.3)

## 2018-03-18 LAB — LIPID PANEL
CHOL/HDL RATIO: 2
Cholesterol: 177 mg/dL (ref 0–200)
HDL: 84.8 mg/dL (ref >39.00)
LDL CALC: 81 mg/dL (ref 0–99)
NONHDL: 92.06
TRIGLYCERIDES: 53 mg/dL (ref 0.0–149.0)
VLDL: 10.6 mg/dL (ref 0.0–40.0)

## 2018-03-18 LAB — BASIC METABOLIC PANEL
BUN: 15 mg/dL (ref 6–23)
CALCIUM: 9.7 mg/dL (ref 8.4–10.5)
CHLORIDE: 104 meq/L (ref 96–112)
CO2: 30 meq/L (ref 19–32)
Creatinine, Ser: 0.92 mg/dL (ref 0.40–1.20)
GFR: 65.64 mL/min (ref >60.00)
GLUCOSE: 93 mg/dL (ref 70–99)
POTASSIUM: 4.7 meq/L (ref 3.5–5.1)
SODIUM: 140 meq/L (ref 135–145)

## 2018-03-18 LAB — VITAMIN D 25 HYDROXY (VIT D DEFICIENCY, FRACTURES): VITD: 39.38 ng/mL (ref 30.00–100.00)

## 2018-03-22 ENCOUNTER — Other Ambulatory Visit: Payer: Self-pay | Admitting: Family Medicine

## 2018-03-22 NOTE — Telephone Encounter (Signed)
Requested Prescriptions  Pending Prescriptions Disp Refills   ALPRAZolam (XANAX) 0.5 MG tablet 180 tablet 1    Sig: Take 1 tablet (0.5 mg total) by mouth 2 (two) times daily.     Not Delegated - Psychiatry:  Anxiolytics/Hypnotics Failed - 03/22/2018  1:54 PM      Failed - This refill cannot be delegated      Failed - Urine Drug Screen completed in last 360 days.      Passed - Valid encounter within last 6 months    Recent Outpatient Visits          3 weeks ago Preventative health care   [facility] [place], Ishmael Holter, MD   1 month ago [facility] [place], MD   2 months ago Acute cystitis without hematuria   [facility], Ebony Hail, MD   4 months ago [facility], Nickola Major, DO   1 year ago Respiratory infection   [facility], [place], DO

## 2018-03-22 NOTE — Telephone Encounter (Signed)
Copied from [place] [id number]. Topic: Quick Communication - Rx Refill/Question >> Mar 22, 2018  1:28 PM Gardiner Ramus wrote: Medication: ALPRAZolam Duanne Moron) 0.5 MG tablet [[id number]]   Has the patient contacted their pharmacy? no Preferred Pharmacy (with phone number or street name):  [place], [place] Agent: Please be advised that RX refills may take up to 3 business days. We ask that you follow-up with your pharmacy.

## 2018-03-23 NOTE — Telephone Encounter (Signed)
Call in #180 with one rf  

## 2018-03-23 NOTE — Telephone Encounter (Signed)
Dr. Fry please advise on refill. Thanks  

## 2018-03-23 NOTE — Addendum Note (Signed)
Addended by: [name] A on: 03/23/2018 09:22 PM   Modules accepted: Orders

## 2018-03-24 ENCOUNTER — Telehealth: Payer: Self-pay | Admitting: Family Medicine

## 2018-03-24 MED ORDER — ALPRAZOLAM 0.5 MG PO TABS: 0.5000 mg | ORAL_TABLET | Freq: Two times a day (BID) | ORAL | 1 refills | Status: DC

## 2018-03-24 NOTE — Telephone Encounter (Signed)
Copied from [place] [id number]. Topic: General - Other >> Mar 24, 2018 12:31 PM Kristina Pearson, NT wrote: Reason for CRM: Pt states that she received a call on Monday stating that Dr. Sarajane Jews is wanting her to have the stress test redone with the dye as well as a heart monitor. She wants to know if this has been ordered and if the cardiology office will call her to schedule.

## 2018-03-24 NOTE — Telephone Encounter (Signed)
Pt notified that the test has been ordered and that she will receive a call for scheduling.

## 2018-03-29 ENCOUNTER — Other Ambulatory Visit: Payer: Self-pay | Admitting: Family Medicine

## 2018-03-30 ENCOUNTER — Encounter (HOSPITAL_COMMUNITY): Payer: 59

## 2018-04-06 ENCOUNTER — Telehealth (HOSPITAL_COMMUNITY): Payer: Self-pay | Admitting: *Deleted

## 2018-04-06 NOTE — Telephone Encounter (Signed)
Patient given detailed instructions per Myocardial Perfusion Study Information Sheet for the test on 04/11/18. Patient notified to arrive 15 minutes early and that it is imperative to arrive on time for appointment to keep from having the test rescheduled.  If you need to cancel or reschedule your appointment, please call the office within 24 hours of your appointment. . Patient verbalized understanding. Kristina Pearson

## 2018-04-07 ENCOUNTER — Encounter (HOSPITAL_COMMUNITY): Payer: 59

## 2018-04-11 ENCOUNTER — Ambulatory Visit (HOSPITAL_COMMUNITY): Payer: 59 | Attending: Cardiovascular Disease

## 2018-04-11 DIAGNOSIS — R9439 Abnormal result of other cardiovascular function study: Secondary | ICD-10-CM | POA: Insufficient documentation

## 2018-04-11 DIAGNOSIS — Z8249 Family history of ischemic heart disease and other diseases of the circulatory system: Secondary | ICD-10-CM | POA: Diagnosis not present

## 2018-04-11 LAB — MYOCARDIAL PERFUSION IMAGING
CHL RATE OF PERCEIVED EXERTION: 18
CSEPHR: 95 %
Estimated workload: 10.1 METS
Exercise duration (min): 8 min
LV sys vol: 25 mL
LVDIAVOL: 70 mL (ref 46–106)
MPHR: 158 {beats}/min
Peak HR: 151 {beats}/min
Rest HR: 57 {beats}/min
SDS: 0
SRS: 2
SSS: 2
TID: 0.95

## 2018-04-11 MED ORDER — TECHNETIUM TC 99M TETROFOSMIN IV KIT
10.2000 | PACK | Freq: Once | INTRAVENOUS | Status: AC | PRN
  Administered 2018-04-11: 10.2 via INTRAVENOUS
  Filled 2018-04-11: qty 11

## 2018-04-11 MED ORDER — TECHNETIUM TC 99M TETROFOSMIN IV KIT
32.4000 | PACK | Freq: Once | INTRAVENOUS | Status: AC | PRN
  Administered 2018-04-11: 32.4 via INTRAVENOUS
  Filled 2018-04-11: qty 33

## 2018-04-13 ENCOUNTER — Ambulatory Visit: Payer: Self-pay | Admitting: *Deleted

## 2018-04-13 NOTE — Telephone Encounter (Signed)
Contacted pt regarding symptoms; she states that she has nasal irritation/ congestion, and sore throat for 2 days; she is also inquiring about the stress test that she had earlier; she says that Dr Sarajane Jews had previously told and would like to discuss the lists of cardiologists that she could see if needed; her that she could take claritin, but she is concerned about taking it because she took vyvanse today; also spoke with [facility] and was advised it is ok to take claritn; Sheena recommended that the review her results with Dr Sarajane Jews; Dr Sarajane Jews has no availability today; information from [facility] relayed to pt; offered to schedule the pt with another provider today but she declined and said that she would call back tomorrow if the claritin did not help; will route to office for notification.  Reason for Disposition . [1] Sore throat with cough/cold symptoms AND [2] present < 5 days  Answer Assessment - Initial Assessment Questions 1. ONSET: "When did the throat start hurting?" (Hours or days ago)      2 days 2. SEVERITY: "How bad is the sore throat?" (Scale 1-10; mild, moderate or severe)   - MILD (1-3):  doesn't interfere with eating or normal activities   - MODERATE (4-7): interferes with eating some solids and normal activities   - SEVERE (8-10):  excruciating pain, interferes with most normal activities   - SEVERE DYSPHAGIA: can't swallow liquids, drooling     mild 3. STREP EXPOSURE: "Has there been any exposure to strep within the past week?" If so, ask: "What type of contact occurred?"      Maybe  4.  VIRAL SYMPTOMS: "Are there any symptoms of a cold, such as a runny nose, cough, hoarse voice or red eyes?"      Nasal irritation and congestion 5. FEVER: "Do you have a fever?" If so, ask: "What is your temperature, how was it measured, and when did it start?"     no 6. PUS ON THE TONSILS: "Is there pus on the tonsils in the back of your throat?"     no 7. OTHER SYMPTOMS: "Do you have any other  symptoms?" (e.g., difficulty breathing, headache, rash)     no 8. PREGNANCY: "Is there any chance you are pregnant?" "When was your last menstrual period?"     no  Protocols used: SORE THROAT-A-AH

## 2018-04-18 ENCOUNTER — Encounter: Payer: Self-pay | Admitting: Family Medicine

## 2018-04-18 ENCOUNTER — Ambulatory Visit (INDEPENDENT_AMBULATORY_CARE_PROVIDER_SITE_OTHER): Payer: 59 | Admitting: Family Medicine

## 2018-04-18 VITALS — BP 102/70 | HR 60 | Temp 97.9°F | Wt 155.6 lb

## 2018-04-18 DIAGNOSIS — S29019A Strain of muscle and tendon of unspecified wall of thorax, initial encounter: Secondary | ICD-10-CM | POA: Diagnosis not present

## 2018-04-18 NOTE — Progress Notes (Signed)
   Subjective:    Patient ID: Debbe Odea, female    DOB: 1955/11/20, 62 y.o.   MRN: [id number]  HPI Here for 5 days of sharp pains in the right upper back around the shoulder blade. This started when she was raking leaves over the weekend. She tried taking Mobic but stopped because it made her GERD worse. No pain in the arm.    Review of Systems  Constitutional: Negative.   Respiratory: Negative.   Cardiovascular: Negative.   Musculoskeletal: Positive for myalgias.       Objective:   Physical Exam  Constitutional: She appears well-developed and well-nourished.  Cardiovascular: Normal rate, regular rhythm, normal heart sounds and intact distal pulses.  Pulmonary/Chest: Effort normal and breath sounds normal.  Musculoskeletal:  Tender in the right upper back between the spine and scapula. Full ROM           Assessment & Plan:  Thoracic strain. Try heat, Tylenol, Robaxin, and massage. Recheck prn.  [name], MD

## 2018-04-20 ENCOUNTER — Encounter: Payer: Self-pay | Admitting: Family Medicine

## 2018-04-26 ENCOUNTER — Ambulatory Visit (INDEPENDENT_AMBULATORY_CARE_PROVIDER_SITE_OTHER): Payer: 59 | Admitting: Adult Health

## 2018-04-26 ENCOUNTER — Encounter: Payer: Self-pay | Admitting: Adult Health

## 2018-04-26 VITALS — BP 130/64 | HR 74 | Temp 98.2°F | Wt 159.2 lb

## 2018-04-26 DIAGNOSIS — R350 Frequency of micturition: Secondary | ICD-10-CM

## 2018-04-26 LAB — POCT URINALYSIS DIPSTICK
Bilirubin, UA: NEGATIVE
GLUCOSE UA: NEGATIVE
Ketones, UA: NEGATIVE
Leukocytes, UA: NEGATIVE
Nitrite, UA: NEGATIVE
Protein, UA: NEGATIVE
SPEC GRAV UA: 1.01 (ref 1.010–1.025)
Urobilinogen, UA: 0.2 E.U./dL
pH, UA: 6 (ref 5.0–8.0)

## 2018-04-26 MED ORDER — AMOXICILLIN-POT CLAVULANATE 875-125 MG PO TABS: 1.0000 | ORAL_TABLET | Freq: Two times a day (BID) | ORAL | 0 refills | Status: AC

## 2018-04-26 MED ORDER — PHENAZOPYRIDINE HCL 100 MG PO TABS: 100.0000 mg | ORAL_TABLET | Freq: Three times a day (TID) | ORAL | 0 refills | Status: DC | PRN

## 2018-04-26 NOTE — Progress Notes (Signed)
Subjective:    Patient ID: Kristina Pearson, female    DOB: 1956-04-30, 62 y.o.   MRN: [id number]  HPI  62 year old female who  has a past medical history of ADHD (attention deficit hyperactivity disorder), Allergy, Arthritis, Breast cancer ([place]) (08/15/13), Fibromyalgia, GERD (gastroesophageal reflux disease), Headache(784.0), History of depression, radiation therapy (10/19/13- 11/09/13), Hypothyroid, Migraine, Osteopenia, Personal history of radiation therapy (2015), PONV (postoperative nausea and vomiting), Sialoadenitis, Spinal stenosis, Urinary incontinence, Vitiligo, and Wears glasses.  She is a patient of Dr. Sarajane Jews who I am seeing today for an acute issue of possible UTI. Her symptoms include dysuria, frequency, and left lower back pain, and chills. Her symptoms have been present for 24 hours. She took cranberry extract this morning and has been drinking water throughout the day   Review of Systems See HPI   Past Medical History:  Diagnosis Date  . ADHD (attention deficit hyperactivity disorder)    ADD  . Allergy   . Arthritis    osteoarthritrs  . Breast cancer ([place]) 08/15/13   right lateral upper outer  . Fibromyalgia   . GERD (gastroesophageal reflux disease)   . Headache(784.0)   . History of depression   . Hx of radiation therapy 10/19/13- 11/09/13   right breast 4256 cGy in 16 sessions, hypo-fractionated  . Hypothyroid   . Migraine   . Osteopenia   . Personal history of radiation therapy 2015  . PONV (postoperative nausea and vomiting)   . Sialoadenitis   . Spinal stenosis   . Urinary incontinence   . Vitiligo   . Wears glasses     Social History   Socioeconomic History  . Marital status: Married    Spouse name: Not on file  . Number of children: 1  . Years of education: Not on file  . Highest education level: Not on file  Occupational History  . Not on file  Social Needs  . Financial resource strain: Not on file  . Food insecurity:    Worry: Not on file   Inability: Not on file  . Transportation needs:    Medical: Not on file    Non-medical: Not on file  Tobacco Use  . Smoking status: Former Smoker    Last attempt to quit: 09/14/1978    Years since quitting: 39.6  . Smokeless tobacco: Never Used  . Tobacco comment: short time in college  Substance and Sexual Activity  . Alcohol use: Yes    Alcohol/week: 1.0 - 2.0 standard drinks    Types: 1 - 2 Standard drinks or equivalent per week  . Drug use: No  . Sexual activity: Yes    Partners: Male    Birth control/protection: Post-menopausal    Comment: menarche age 64, first live birth age 49, menopause age 59, no HRT  Lifestyle  . Physical activity:    Days per week: Not on file    Minutes per session: Not on file  . Stress: Not on file  Relationships  . Social connections:    Talks on phone: Not on file    Gets together: Not on file    Attends religious service: Not on file    Active member of club or organization: Not on file    Attends meetings of clubs or organizations: Not on file    Relationship status: Not on file  . Intimate partner violence:    Fear of current or ex partner: Not on file    Emotionally abused: Not  on file    Physically abused: Not on file    Forced sexual activity: Not on file  Other Topics Concern  . Not on file  Social History Narrative   Lives with husband, daughter and mother in a 2 story home.  Has one child.  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  Education: college degree    Past Surgical History:  Procedure Laterality Date  . BREAST BIOPSY Right 07/2013  . BREAST LUMPECTOMY Right 08/2013  . BREAST LUMPECTOMY WITH RADIOACTIVE SEED LOCALIZATION Right 08/30/2013   Procedure: RIGHT PARTIAL MASTECTOMY WITH RADIOACTIVE SEED LOCALIZATION;  Surgeon: [name], MD;  Location: [place];  Service: General;  Laterality: Right;  . BREAST SURGERY    . CHOLECYSTECTOMY  1996   lap  . COLONOSCOPY  2007   per Dr.  Cristina Gong, clear, repeat in 10 yrs  . DIAGNOSTIC LAPAROSCOPY  1982   exp  . LAPAROSCOPIC OVARIAN CYSTECTOMY    . URETHRAL DILATION      Family History  Problem Relation Age of Onset  . Arthritis Mother   . Diabetes Mother   . Osteoporosis Mother   . Polymyalgia rheumatica Mother        on prednisone  . Heart disease Mother   . Hyperlipidemia Mother   . Hypertension Mother   . Stroke Mother   . Heart attack Father   . Heart disease Father   . Colon cancer Maternal Uncle        dx in his 26s  . Colon cancer Cousin 65       mother's maternal first cousin  . Breast cancer Cousin 41       BRCA negative; father's maternal first cousin  . Leukemia Maternal Uncle 81       AML  . Melanoma Maternal Uncle 73  . Leukemia Maternal Uncle 60  . Ovarian cancer Cousin 32       maternal cousin  . Ovarian cancer Cousin 24       maternal cousin  . Leukemia Cousin        dx in his 45s; maternal cousin; thought to be the result of taking [place]  . Heart attack Paternal Uncle   . Colon cancer Cousin 36       maternal second cousin  . Breast cancer Other        maternal great aunt dx in her 63s  . Ovarian cancer Other        maternal grandfather's sister dx in her 53s  . Colon cancer Other        maternal grandfather's sister dx in her 61s  . Colon cancer Other        maternal grandfather's sister dx in her 7s  . Other Daughter        dermoid tumor  . Diabetes Paternal Grandmother   . Skin cancer Paternal Grandmother   . Hypertension Paternal Grandmother     No Known Allergies  Current Outpatient Medications on File Prior to Visit  Medication Sig Dispense Refill  . ALPRAZolam (XANAX) 0.5 MG tablet Take 1 tablet (0.5 mg total) by mouth 2 (two) times daily. 180 tablet 1  . aspirin EC 81 MG tablet Take 1 tablet (81 mg total) by mouth daily. 1 tablet 0  . cycloSPORINE (RESTASIS) 0.05 % ophthalmic emulsion Place 1 drop into both eyes 2 (two) times daily.    Marland Kitchen ELDERBERRY PO Take 10 mLs  by mouth daily.    Marland Kitchen  gabapentin (NEURONTIN) 100 MG capsule Take 100 mg by mouth at bedtime.    Marland Kitchen HYDROcodone-acetaminophen (NORCO) 10-325 MG tablet Take 1 tablet by mouth every 6 (six) hours as needed for severe pain. 120 tablet 0  . levothyroxine (SYNTHROID, LEVOTHROID) 112 MCG tablet Take 112 mcg by mouth daily before breakfast.    . Magnesium 400 MG CAPS Take by mouth daily.    . methocarbamol (ROBAXIN) 500 MG tablet TAKE 1 TABLET BY MOUTH EVERY 6 HOURS AS NEEDED FOR MUSCLE SPASMS 120 tablet 5  . Multiple Vitamin (MULTIVITAMIN) tablet Take 1 tablet by mouth daily. One a day for over age 55    . NON FORMULARY     . PROAIR RESPICLICK 883 (90 Base) MCG/ACT AEPB daily as needed.  0  . Probiotic Product (PROBIOTIC DAILY PO) Take by mouth.    . traZODone (DESYREL) 100 MG tablet TAKE 2 TABLETS EVERY NIGHT (Patient taking differently: TAKE 1 TABLE  EVERY NIGHT) 180 tablet 3  . lisdexamfetamine (VYVANSE) 40 MG capsule Take 1 capsule (40 mg total) by mouth every morning. 30 capsule 0  . phenazopyridine (PYRIDIUM) 100 MG tablet Take 1 tablet (100 mg total) by mouth 3 (three) times daily as needed for pain. (Patient not taking: Reported on 04/26/2018) 10 tablet 0   No current facility-administered medications on file prior to visit.     BP 130/64 (BP Location: Left Arm, Patient Position: Sitting, Cuff Size: Normal)   Pulse 74   Temp 98.2 F (36.8 C) (Oral)   Wt 159 lb 3.2 oz (72.2 kg)   LMP 09/21/2006   SpO2 98%   BMI 25.70 kg/m       Objective:   Physical Exam  Constitutional: She is oriented to person, place, and time. She appears well-developed and well-nourished.  Cardiovascular: Normal rate, regular rhythm, normal heart sounds and intact distal pulses.  Pulmonary/Chest: Effort normal and breath sounds normal.  Abdominal: Soft. Bowel sounds are normal. There is tenderness in the suprapubic area. There is CVA tenderness.  Neurological: She is alert and oriented to person, place, and time.   Skin: Skin is warm and dry. She is not diaphoretic.  Nursing note and vitals reviewed.     Assessment & Plan:  1. Urinary frequency - POCT urinalysis dipstick - + blood but negative for leuks and nit. Will send for culture and treat due to symptoms  - phenazopyridine (PYRIDIUM) 100 MG tablet; Take 1 tablet (100 mg total) by mouth 3 (three) times daily as needed for pain.  Dispense: 10 tablet; Refill: 0 - Culture, Urine - amoxicillin-clavulanate (AUGMENTIN) 875-125 MG tablet; Take 1 tablet by mouth 2 (two) times daily for 3 days.  Dispense: 6 tablet; Refill: 0   Dorothyann Peng, NP

## 2018-04-27 LAB — URINE CULTURE
MICRO NUMBER:: 91330632
Result:: NO GROWTH
SPECIMEN QUALITY:: ADEQUATE

## 2018-05-16 ENCOUNTER — Other Ambulatory Visit: Payer: Self-pay | Admitting: Family Medicine

## 2018-05-16 NOTE — Telephone Encounter (Signed)
Copied from [place] [id number]. Topic: Quick Communication - Rx Refill/Question >> May 16, 2018  2:24 PM Burchel, Abbi R wrote: Medication: lisdexamfetamine (VYVANSE) 40 MG capsule (Expired)  Has the patient contacted their pharmacy? Yes.    Preferred Pharmacy: [id number] [address] [place] [address] Phone: [phone] Fax: [phone]      Pt was advised that RX refills may take up to 3 business days. We ask that you follow-up with your pharmacy.

## 2018-05-17 NOTE — Telephone Encounter (Signed)
Requested medication (s) are due for refill today: yes  Requested medication (s) are on the active medication list: yes    Last refill: 01/21/18  #30 0 refills  Future visit scheduled no  Notes to clinic:not delegated  Requested Prescriptions  Pending Prescriptions Disp Refills   lisdexamfetamine (VYVANSE) 40 MG capsule 30 capsule 0    Sig: Take 1 capsule (40 mg total) by mouth every morning.     Not Delegated - Psychiatry:  Stimulants/ADHD Failed - 05/17/2018 11:52 AM      Failed - This refill cannot be delegated      Failed - Urine Drug Screen completed in last 360 days.      Passed - Valid encounter within last 3 months    Recent Outpatient Visits          3 weeks ago Urinary frequency   Therapist, music at [facility], [place], NP   4 weeks ago Thoracic myofascial strain, initial Education administrator at [place], MD   2 months ago Preventative health care   [facility] at [place], MD   2 months ago [place] at [facility], Standley Brooking, MD   4 months ago Acute cystitis without hematuria   [facility] Arlie Solomons, Ebony Hail, MD

## 2018-05-18 MED ORDER — LISDEXAMFETAMINE DIMESYLATE 40 MG PO CAPS: 40.0000 mg | ORAL_CAPSULE | ORAL | 0 refills | Status: DC

## 2018-05-18 NOTE — Telephone Encounter (Signed)
Dr. Fry please advise on refill. thanks 

## 2018-05-18 NOTE — Telephone Encounter (Signed)
Done

## 2018-05-25 ENCOUNTER — Encounter: Payer: Self-pay | Admitting: Family Medicine

## 2018-05-25 NOTE — Telephone Encounter (Signed)
Dr. Fry please advise. Thanks  

## 2018-05-27 NOTE — Telephone Encounter (Signed)
Change Xanax to 1 mg tablets to take 1/2 tab BID, call in #90 with 3 rf

## 2018-05-28 ENCOUNTER — Encounter: Payer: Self-pay | Admitting: Family Medicine

## 2018-05-31 ENCOUNTER — Encounter: Payer: Self-pay | Admitting: Family Medicine

## 2018-06-01 MED ORDER — ALPRAZOLAM 1 MG PO TABS: ORAL_TABLET | ORAL | 3 refills | Status: DC

## 2018-06-03 MED ORDER — ALPRAZOLAM 0.5 MG PO TABS: 0.5000 mg | ORAL_TABLET | Freq: Two times a day (BID) | ORAL | 0 refills | Status: DC | PRN

## 2018-06-03 NOTE — Telephone Encounter (Signed)
Dr. Sarajane Jews the pt is currently in [place] with her father that is in hospice.  She is requesting that the xanax rx be sent to the [facility] in [place]/  Please advise if this can be done.  Thanks

## 2018-06-03 NOTE — Telephone Encounter (Signed)
Call in Xanax 0.5 mg to take BID to the [facility], #60 with no rf

## 2018-06-06 NOTE — Telephone Encounter (Signed)
rx was called to the pharmacy in [place].

## 2018-06-16 ENCOUNTER — Other Ambulatory Visit: Payer: Self-pay | Admitting: Family Medicine

## 2018-06-16 NOTE — Telephone Encounter (Signed)
Copied from [place] [id number]. Topic: Quick Communication - Rx Refill/Question >> Jun 16, 2018  4:07 PM Wynetta Emery, [place] C wrote: Medication: lisdexamfetamine (VYVANSE) 40 MG capsule   Has the patient contacted their pharmacy? Yes   (Agent: If no, request that the patient contact the pharmacy for the refill.) (Agent: If yes, when and what did the pharmacy advise?)  Preferred Pharmacy (with phone number or street name): [id number] [place], [place]: Please be advised that RX refills may take up to 3 business days. We ask that you follow-up with your pharmacy.  Pt would like a call confirming when Rx has been sent, please.

## 2018-06-16 NOTE — Telephone Encounter (Signed)
Requested medication (s) are due for refill today: yes  Requested medication (s) are on the active medication list: yes    Last refill: 05/18/18 #30 0 refills  Future visit scheduled no  Notes to clinic:not delegated  Requested Prescriptions  Pending Prescriptions Disp Refills   lisdexamfetamine (VYVANSE) 40 MG capsule 30 capsule 0    Sig: Take 1 capsule (40 mg total) by mouth every morning.     Not Delegated - Psychiatry:  Stimulants/ADHD Failed - 06/16/2018  4:24 PM      Failed - This refill cannot be delegated      Failed - Urine Drug Screen completed in last 360 days.      Passed - Valid encounter within last 3 months    Recent Outpatient Visits          1 month ago Urinary frequency   Therapist, music at [facility], [place], NP   1 month ago Air traffic controller strain, initial Education administrator at [place], MD   3 months ago Preventative health care   [employer] [facility] at [place], MD   3 months ago [place] at [facility], Standley Brooking, MD   5 months ago Acute cystitis without hematuria   [facility] Jacqualine Code, MD

## 2018-08-02 ENCOUNTER — Inpatient Hospital Stay: Payer: 59 | Attending: Adult Health | Admitting: Adult Health

## 2018-08-02 ENCOUNTER — Encounter: Payer: Self-pay | Admitting: Adult Health

## 2018-08-02 ENCOUNTER — Telehealth: Payer: Self-pay | Admitting: Adult Health

## 2018-08-02 VITALS — BP 120/65 | HR 89 | Temp 98.2°F | Resp 18 | Ht 66.0 in | Wt 159.2 lb

## 2018-08-02 DIAGNOSIS — Z853 Personal history of malignant neoplasm of breast: Secondary | ICD-10-CM | POA: Insufficient documentation

## 2018-08-02 DIAGNOSIS — Z806 Family history of leukemia: Secondary | ICD-10-CM | POA: Diagnosis not present

## 2018-08-02 DIAGNOSIS — Z7982 Long term (current) use of aspirin: Secondary | ICD-10-CM | POA: Insufficient documentation

## 2018-08-02 DIAGNOSIS — Z79899 Other long term (current) drug therapy: Secondary | ICD-10-CM | POA: Diagnosis not present

## 2018-08-02 DIAGNOSIS — Z803 Family history of malignant neoplasm of breast: Secondary | ICD-10-CM

## 2018-08-02 DIAGNOSIS — M8580 Other specified disorders of bone density and structure, unspecified site: Secondary | ICD-10-CM | POA: Insufficient documentation

## 2018-08-02 DIAGNOSIS — Z923 Personal history of irradiation: Secondary | ICD-10-CM | POA: Diagnosis not present

## 2018-08-02 DIAGNOSIS — Z87891 Personal history of nicotine dependence: Secondary | ICD-10-CM | POA: Diagnosis not present

## 2018-08-02 DIAGNOSIS — C50411 Malignant neoplasm of upper-outer quadrant of right female breast: Secondary | ICD-10-CM

## 2018-08-02 DIAGNOSIS — Z800 Family history of malignant neoplasm of digestive organs: Secondary | ICD-10-CM | POA: Diagnosis not present

## 2018-08-02 DIAGNOSIS — Z170 Estrogen receptor positive status [ER+]: Secondary | ICD-10-CM | POA: Diagnosis not present

## 2018-08-02 DIAGNOSIS — Z8041 Family history of malignant neoplasm of ovary: Secondary | ICD-10-CM | POA: Diagnosis not present

## 2018-08-02 NOTE — Progress Notes (Signed)
CLINIC:  Survivorship   REASON FOR VISIT:  Routine follow-up for history of breast cancer.   BRIEF ONCOLOGIC HISTORY:    Breast cancer of upper-outer quadrant of right female breast ([place])   08/15/2013 Initial Diagnosis    DCIS with calcifications    08/30/2013 Surgery    Right breast lumpectomy: No evidence of residual DCIS found to have atypical lobular hyperplasia    09/26/2013 - 11/09/2013 Radiation Therapy    Adjuvant radiation therapy by Dr. Valere Dross    11/21/2013 - 08/22/2015 Anti-estrogen oral therapy    Tamoxifen 20 mg daily stopped because patient was having problems with lack of interest in life which she attributed to tamoxifen.      INTERVAL HISTORY:  Kristina Pearson presents to the Survivorship Clinic today for routine follow-up for her history of breast cancer.  Overall, she reports feeling quite well. She recently lost her mom and her father is ill and in a nursing home in [place].  This has been hard for her.  Kristina Pearson is doing well.  She has continued bresat soreness that has been present since her breast surgery.  She notes that having a mammogram with an ultrasound helps the discomfort to improve.  She thinks she underwent bone density testing last year.  She was told to take calcium, but she isnt' sure how much.  She sees PCP regularly and needs to get her colon cancer screening rescheduled.  She is due for mammogram in 08/2018.   She also underwent pap in 10/2016.    REVIEW OF SYSTEMS:  Review of Systems  Constitutional: Negative for appetite change, chills, fatigue, fever and unexpected weight change.  HENT:   Negative for hearing loss, lump/mass and trouble swallowing.   Eyes: Negative for eye problems and icterus.  Respiratory: Negative for chest tightness, cough and shortness of breath.   Cardiovascular: Negative for chest pain, leg swelling and palpitations.  Gastrointestinal: Negative for abdominal distention, abdominal pain, constipation, diarrhea, nausea and  vomiting.  Endocrine: Negative for hot flashes.  Musculoskeletal: Negative for arthralgias.  Skin: Negative for itching and rash.  Neurological: Negative for dizziness, extremity weakness, headaches and numbness.  Hematological: Negative for adenopathy. Does not bruise/bleed easily.  Psychiatric/Behavioral: Negative for depression. The patient is not nervous/anxious.   Breast: Denies any new nodularity, masses, tenderness, nipple changes, or nipple discharge.       PAST MEDICAL/SURGICAL HISTORY:  Past Medical History:  Diagnosis Date  . ADHD (attention deficit hyperactivity disorder)    ADD  . Allergy   . Arthritis    osteoarthritrs  . Breast cancer ([place]) 08/15/13   right lateral upper outer  . Fibromyalgia   . GERD (gastroesophageal reflux disease)   . Headache(784.0)   . History of depression   . Hx of radiation therapy 10/19/13- 11/09/13   right breast 4256 cGy in 16 sessions, hypo-fractionated  . Hypothyroid   . Migraine   . Osteopenia   . Personal history of radiation therapy 2015  . PONV (postoperative nausea and vomiting)   . Sialoadenitis   . Spinal stenosis   . Urinary incontinence   . Vitiligo   . Wears glasses    Past Surgical History:  Procedure Laterality Date  . BREAST BIOPSY Right 07/2013  . BREAST LUMPECTOMY Right 08/2013  . BREAST LUMPECTOMY WITH RADIOACTIVE SEED LOCALIZATION Right 08/30/2013   Procedure: RIGHT PARTIAL MASTECTOMY WITH RADIOACTIVE SEED LOCALIZATION;  Surgeon: [name], MD;  Location: [facility];  Service: General;  Laterality:  Right;  Marland Kitchen BREAST SURGERY    . CHOLECYSTECTOMY  1996   lap  . COLONOSCOPY  2007   per Dr. Cristina Gong, clear, repeat in 10 yrs  . DIAGNOSTIC LAPAROSCOPY  1982   exp  . LAPAROSCOPIC OVARIAN CYSTECTOMY    . URETHRAL DILATION       ALLERGIES:  No Known Allergies   CURRENT MEDICATIONS:  Outpatient Encounter Medications as of 08/02/2018  Medication Sig  . ALPRAZolam (XANAX) 0.5 MG tablet  Take 1 tablet (0.5 mg total) by mouth 2 (two) times daily as needed for anxiety.  . ALPRAZolam (XANAX) 1 MG tablet Take 1/2 tablet by mouth two times daily as needed  . aspirin EC 81 MG tablet Take 1 tablet (81 mg total) by mouth daily.  . cycloSPORINE (RESTASIS) 0.05 % ophthalmic emulsion Place 1 drop into both eyes 2 (two) times daily.  Marland Kitchen ELDERBERRY PO Take 10 mLs by mouth daily.  Marland Kitchen gabapentin (NEURONTIN) 100 MG capsule Take 100 mg by mouth at bedtime. Has been taking 300 mg daily  . HYDROcodone-acetaminophen (NORCO) 10-325 MG tablet Take 1 tablet by mouth every 6 (six) hours as needed for severe pain.  Marland Kitchen levothyroxine (SYNTHROID, LEVOTHROID) 112 MCG tablet Take 112 mcg by mouth daily before breakfast.  . lisdexamfetamine (VYVANSE) 40 MG capsule Take 1 capsule (40 mg total) by mouth every morning.  . Magnesium 400 MG CAPS Take by mouth daily.  . methocarbamol (ROBAXIN) 500 MG tablet TAKE 1 TABLET BY MOUTH EVERY 6 HOURS AS NEEDED FOR MUSCLE SPASMS  . Multiple Vitamin (MULTIVITAMIN) tablet Take 1 tablet by mouth daily. One a day for over age 56  . NON FORMULARY   . PROAIR RESPICLICK 017 (90 Base) MCG/ACT AEPB daily as needed.  . Probiotic Product (PROBIOTIC DAILY PO) Take by mouth.  . traZODone (DESYREL) 100 MG tablet TAKE 2 TABLETS EVERY NIGHT (Patient taking differently: TAKE 1 TABLE  EVERY NIGHT)  . UNABLE TO FIND CBD oil 20 mg - half a dropper at bedtime for pain or scalp psoriases  . phenazopyridine (PYRIDIUM) 100 MG tablet Take 1 tablet (100 mg total) by mouth 3 (three) times daily as needed for pain. (Patient not taking: Reported on 04/26/2018)  . phenazopyridine (PYRIDIUM) 100 MG tablet Take 1 tablet (100 mg total) by mouth 3 (three) times daily as needed for pain. (Patient not taking: Reported on 08/02/2018)   No facility-administered encounter medications on file as of 08/02/2018.      ONCOLOGIC FAMILY HISTORY:  Family History  Problem Relation Age of Onset  . Arthritis Mother   .  Diabetes Mother   . Osteoporosis Mother   . Polymyalgia rheumatica Mother        on prednisone  . Heart disease Mother   . Hyperlipidemia Mother   . Hypertension Mother   . Stroke Mother   . Heart attack Father   . Heart disease Father   . Colon cancer Maternal Uncle        dx in his 7s  . Colon cancer Cousin 57       mother's maternal first cousin  . Breast cancer Cousin 73       BRCA negative; father's maternal first cousin  . Leukemia Maternal Uncle 81       AML  . Melanoma Maternal Uncle 73  . Leukemia Maternal Uncle 75  . Ovarian cancer Cousin 50       maternal cousin  . Ovarian cancer Cousin 24  maternal cousin  . Leukemia Cousin        dx in his 20s; maternal cousin; thought to be the result of taking [place]  . Heart attack Paternal Uncle   . Colon cancer Cousin 39       maternal second cousin  . Breast cancer Other        maternal great aunt dx in her 89s  . Ovarian cancer Other        maternal grandfather's sister dx in her 82s  . Colon cancer Other        maternal grandfather's sister dx in her 91s  . Colon cancer Other        maternal grandfather's sister dx in her 51s  . Other Daughter        dermoid tumor  . Diabetes Paternal Grandmother   . Skin cancer Paternal Grandmother   . Hypertension Paternal Grandmother     GENETIC COUNSELING/TESTING: Not at this time  SOCIAL HISTORY:  Social History   Socioeconomic History  . Marital status: Married    Spouse name: Not on file  . Number of children: 1  . Years of education: Not on file  . Highest education level: Not on file  Occupational History  . Not on file  Social Needs  . Financial resource strain: Not on file  . Food insecurity:    Worry: Not on file    Inability: Not on file  . Transportation needs:    Medical: Not on file    Non-medical: Not on file  Tobacco Use  . Smoking status: Former Smoker    Last attempt to quit: 09/14/1978    Years since quitting: 39.9  . Smokeless  tobacco: Never Used  . Tobacco comment: short time in college  Substance and Sexual Activity  . Alcohol use: Yes    Alcohol/week: 1.0 - 2.0 standard drinks    Types: 1 - 2 Standard drinks or equivalent per week  . Drug use: No  . Sexual activity: Yes    Partners: Male    Birth control/protection: Post-menopausal    Comment: menarche age 81, first live birth age 74, menopause age 89, no HRT  Lifestyle  . Physical activity:    Days per week: Not on file    Minutes per session: Not on file  . Stress: Not on file  Relationships  . Social connections:    Talks on phone: Not on file    Gets together: Not on file    Attends religious service: Not on file    Active member of club or organization: Not on file    Attends meetings of clubs or organizations: Not on file    Relationship status: Not on file  . Intimate partner violence:    Fear of current or ex partner: Not on file    Emotionally abused: Not on file    Physically abused: Not on file    Forced sexual activity: Not on file  Other Topics Concern  . Not on file  Social History Narrative   Lives with husband, daughter and mother in a 2 story home.  Has one child.  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  Education: college degree      PHYSICAL EXAMINATION:  Vital Signs: Vitals:   08/02/18 1449  BP: 120/65  Pulse: 89  Resp: 18  Temp: 98.2 F (36.8 C)  SpO2: 99%   Filed Weights   08/02/18 1449  Weight: 159  lb 3.2 oz (72.2 kg)   General: Well-nourished, well-appearing female in no acute distress.  Unaccompanied/ today.   HEENT: Head is normocephalic.  Pupils equal and reactive to light. Conjunctivae clear without exudate.  Sclerae anicteric. Oral mucosa is pink, moist.  Oropharynx is pink without lesions or erythema.  Lymph: No cervical, supraclavicular, or infraclavicular lymphadenopathy noted on palpation.  Cardiovascular: Regular rate and rhythm.Marland Kitchen Respiratory: Clear to auscultation bilaterally. Chest  expansion symmetric; breathing non-labored.  Breast Exam:  -Left breast: No appreciable masses on palpation. No skin redness, thickening, or peau d'orange appearance; no nipple retraction or nipple discharge; + tenderness -Right breast: No appreciable masses on palpation. No skin redness, thickening, or peau d'orange appearance; no nipple retraction or nipple discharge; mild distortion in symmetry at previous lumpectomy site well healed scar without erythema or nodularity. + tenderness -Axilla: No axillary adenopathy bilaterally.  GI: Abdomen soft and round; non-tender, non-distended. Bowel sounds normoactive. No hepatosplenomegaly.   GU: Deferred.  Neuro: No focal deficits. Steady gait.  Psych: Mood and affect normal and appropriate for situation.  MSK: No focal spinal tenderness to palpation, full range of motion in bilateral upper extremities Extremities: No edema. Skin: Warm and dry.  LABORATORY DATA:  None for this visit   DIAGNOSTIC IMAGING:  Most recent mammogram:      ASSESSMENT AND PLAN:  Kristina Pearson is a pleasant 63 y.o. female with history of Stage 0 right breast DCIS, ER+/PR+/HER2-, diagnosed in 07/2013, treated with lumpectomy, adjuvant radiation therapy, and anti-estrogen therapy with Tamoxifen x 2 years, stopped early due to anhedonia.  She presents to the Survivorship Clinic for surveillance and routine follow-up.   1. History of breast cancer:  Kristina Pearson is currently clinically and radiographically without evidence of disease or recurrence of breast cancer. She will be due for mammogram in 08/2018; orders placed today.  She requested to have ultrasound in addition due to the intense pain during her mammograms and the fact that the pain is improved with ultrasounds.  She also has increased tenderness on breast exam.  She would like to return annually to see [place], and so we will see her back in one year for continued surveillance.  I encouraged her to call me with any questions or  concerns before her next visit at the cancer center, and I would be happy to see her sooner, if needed.    2. Bone health:  Given Kristina Pearson's age, history of breast cancer, she is at risk for bone demineralization. Her last DEXA scan was on 08/2017 and showed osteopenia with a t score of -1.7.  I reviewed with her what this means in detail.  She will be due for repeat in 08/2019.  In the meantime, she was encouraged to increase her consumption of foods rich in calcium, as well as increase her weight-bearing activities.  She was given education on specific food and activities to promote bone health.  3. Cancer screening:  Due to Kristina Pearson's history and her age, she should receive screening for skin cancers, colon cancer, and gynecologic cancers. She was encouraged to follow-up with her PCP for appropriate cancer screenings.   4. Health maintenance and wellness promotion: Kristina Pearson was encouraged to consume 5-7 servings of fruits and vegetables per day. She was also encouraged to engage in moderate to vigorous exercise for 30 minutes per day most days of the week. She was instructed to limit her alcohol consumption and continue to abstain from tobacco use.    Dispo:  -  Return to cancer center in 1 year -Mammogram and ultrasounds in 08/2018 -Bone density in 08/2019   A total of (30) minutes of face-to-face time was spent with this patient with greater than 50% of that time in counseling and care-coordination.   Gardenia Phlegm, NP [facility] [place] [phone]   Note: PRIMARY CARE PROVIDER Laurey Morale, [place] [phone]

## 2018-08-02 NOTE — Telephone Encounter (Signed)
Gave avs and calendar ° °

## 2018-08-02 NOTE — Patient Instructions (Signed)

## 2018-08-04 ENCOUNTER — Other Ambulatory Visit: Payer: Self-pay | Admitting: Family Medicine

## 2018-08-04 NOTE — Telephone Encounter (Signed)
Dr. Fry please advise on refill. Thanks  

## 2018-09-05 ENCOUNTER — Telehealth: Payer: Self-pay | Admitting: Family Medicine

## 2018-09-05 NOTE — Telephone Encounter (Signed)
Copied from [place] [id number]. Topic: Quick Communication - Rx Refill/Question >> Sep 05, 2018 12:28 PM Percell Belt A wrote: Medication: lisdexamfetamine (VYVANSE) 40 MG capsule [[id number]]  ENDED   Has the patient contacted their pharmacy? Yes.  - No more refills  , this med was last filled in nov 2019 and was in [place] for 3 months helping with sick father  (Agent: If no, request that the patient contact the pharmacy for the refill.) (Agent: If yes, when and what did the pharmacy advise?)  Preferred Pharmacy (with phone number or street name): [id number] [address], [place] [phone] (Phone)   Agent: Please be advised that RX refills may take up to 3 business days. We ask that you follow-up with your pharmacy.

## 2018-09-06 MED ORDER — LISDEXAMFETAMINE DIMESYLATE 40 MG PO CAPS: 40.0000 mg | ORAL_CAPSULE | ORAL | 0 refills | Status: DC

## 2018-09-06 NOTE — Telephone Encounter (Signed)
Done

## 2018-09-06 NOTE — Telephone Encounter (Signed)
Dr. Fry please advise on refill of medication.   

## 2018-09-08 ENCOUNTER — Other Ambulatory Visit: Payer: 59

## 2018-09-11 ENCOUNTER — Other Ambulatory Visit: Payer: Self-pay | Admitting: Family Medicine

## 2018-09-12 ENCOUNTER — Telehealth: Payer: Self-pay

## 2018-09-12 NOTE — Telephone Encounter (Signed)
PA for lisdexamfetamine (VYVANSE) 40 MG capsule sent to cover my meds.  KeyWynelle Fanny - PA Case ID: [id number] - Rx #: [id number]

## 2018-10-21 ENCOUNTER — Encounter: Payer: Self-pay | Admitting: Family Medicine

## 2018-10-21 ENCOUNTER — Other Ambulatory Visit: Payer: Self-pay

## 2018-10-21 ENCOUNTER — Ambulatory Visit (INDEPENDENT_AMBULATORY_CARE_PROVIDER_SITE_OTHER): Payer: 59 | Admitting: Family Medicine

## 2018-10-21 DIAGNOSIS — J301 Allergic rhinitis due to pollen: Secondary | ICD-10-CM | POA: Diagnosis not present

## 2018-10-21 DIAGNOSIS — G8929 Other chronic pain: Secondary | ICD-10-CM

## 2018-10-21 DIAGNOSIS — M4802 Spinal stenosis, cervical region: Secondary | ICD-10-CM | POA: Diagnosis not present

## 2018-10-21 DIAGNOSIS — R42 Dizziness and giddiness: Secondary | ICD-10-CM | POA: Diagnosis not present

## 2018-10-21 DIAGNOSIS — R51 Headache: Secondary | ICD-10-CM

## 2018-10-21 DIAGNOSIS — H6981 Other specified disorders of Eustachian tube, right ear: Secondary | ICD-10-CM

## 2018-10-21 DIAGNOSIS — G4700 Insomnia, unspecified: Secondary | ICD-10-CM

## 2018-10-21 MED ORDER — CETIRIZINE-PSEUDOEPHEDRINE ER 5-120 MG PO TB12: 1.0000 | ORAL_TABLET | Freq: Two times a day (BID) | ORAL | 0 refills | Status: DC

## 2018-10-21 MED ORDER — TEMAZEPAM 30 MG PO CAPS: 30.0000 mg | ORAL_CAPSULE | Freq: Every evening | ORAL | 3 refills | Status: DC | PRN

## 2018-10-21 MED ORDER — MECLIZINE HCL 25 MG PO TABS: 25.0000 mg | ORAL_TABLET | ORAL | 5 refills | Status: DC | PRN

## 2018-10-21 MED ORDER — GABAPENTIN 100 MG PO CAPS: 100.0000 mg | ORAL_CAPSULE | Freq: Three times a day (TID) | ORAL | 5 refills | Status: DC | PRN

## 2018-10-21 NOTE — Progress Notes (Signed)
Subjective:    Patient ID: Kristina Pearson, female    DOB: October 20, 1955, 63 y.o.   MRN: [id number]  HPI Virtual Visit via Video Note  I connected with the patient on 10/21/18 at 11:30 AM EDT by a video enabled telemedicine application and verified that I am speaking with the correct person using two identifiers.  Location patient: home Location provider:work or home office Persons participating in the virtual visit: patient, provider  I discussed the limitations of evaluation and management by telemedicine and the availability of in person appointments. The patient expressed understanding and agreed to proceed.   HPI: Here for several issues. First she has had vertigo for the past 3 days, and this makes it seem like the room is swirling around her. This can occur when she is upright or when she is lying in bed. She has had a fair amount of trouble with her allergies for a few weeks, and this causes itchy eyes, stuffy head, and PND. No fever or ST or cough. She is taking Zyrtec daily. She also has some pressure and slight pain in the right ear. Also she takes Gabapentin 300 mg TID for chronic headaches for cervical spinal stenosis, and she asks of she can also have some 100 mg Gabapentin on hand to use along with it when the headaches get bad. Lastly she has had trouble sleeping for several months despite taking 200 mg of Trazodone at bedtime.   ROS: See pertinent positives and negatives per HPI.  Past Medical History:  Diagnosis Date  . ADHD (attention deficit hyperactivity disorder)    ADD  . Allergy   . Arthritis    osteoarthritrs  . Breast cancer ([place]) 08/15/13   right lateral upper outer  . Fibromyalgia   . GERD (gastroesophageal reflux disease)   . Headache(784.0)   . History of depression   . Hx of radiation therapy 10/19/13- 11/09/13   right breast 4256 cGy in 16 sessions, hypo-fractionated  . Hypothyroid   . Migraine   . Osteopenia   . Personal history of radiation therapy  2015  . PONV (postoperative nausea and vomiting)   . Sialoadenitis   . Spinal stenosis   . Urinary incontinence   . Vitiligo   . Wears glasses     Past Surgical History:  Procedure Laterality Date  . BREAST BIOPSY Right 07/2013  . BREAST LUMPECTOMY Right 08/2013  . BREAST LUMPECTOMY WITH RADIOACTIVE SEED LOCALIZATION Right 08/30/2013   Procedure: RIGHT PARTIAL MASTECTOMY WITH RADIOACTIVE SEED LOCALIZATION;  Surgeon: [name], MD;  Location: [facility];  Service: General;  Laterality: Right;  . BREAST SURGERY    . CHOLECYSTECTOMY  1996   lap  . COLONOSCOPY  2007   per Dr. Cristina Gong, clear, repeat in 10 yrs  . DIAGNOSTIC LAPAROSCOPY  1982   exp  . LAPAROSCOPIC OVARIAN CYSTECTOMY    . URETHRAL DILATION      Family History  Problem Relation Age of Onset  . Arthritis Mother   . Diabetes Mother   . Osteoporosis Mother   . Polymyalgia rheumatica Mother        on prednisone  . Heart disease Mother   . Hyperlipidemia Mother   . Hypertension Mother   . Stroke Mother   . Heart attack Father   . Heart disease Father   . Colon cancer Maternal Uncle        dx in his 46s  . Colon cancer Cousin 50  mother's maternal first cousin  . Breast cancer Cousin 60       BRCA negative; father's maternal first cousin  . Leukemia Maternal Uncle 81       AML  . Melanoma Maternal Uncle 73  . Leukemia Maternal Uncle 63  . Ovarian cancer Cousin 57       maternal cousin  . Ovarian cancer Cousin 24       maternal cousin  . Leukemia Cousin        dx in his 54s; maternal cousin; thought to be the result of taking [place]  . Heart attack Paternal Uncle   . Colon cancer Cousin 40       maternal second cousin  . Breast cancer Other        maternal great aunt dx in her 67s  . Ovarian cancer Other        maternal grandfather's sister dx in her 51s  . Colon cancer Other        maternal grandfather's sister dx in her 60s  . Colon cancer Other        maternal  grandfather's sister dx in her 64s  . Other Daughter        dermoid tumor  . Diabetes Paternal Grandmother   . Skin cancer Paternal Grandmother   . Hypertension Paternal Grandmother      Current Outpatient Medications:  .  ALPRAZolam (XANAX) 0.5 MG tablet, Take 1 tablet (0.5 mg total) by mouth 2 (two) times daily as needed for anxiety., Disp: 60 tablet, Rfl: 0 .  ALPRAZolam (XANAX) 1 MG tablet, Take 1/2 tablet by mouth two times daily as needed, Disp: 90 tablet, Rfl: 3 .  aspirin EC 81 MG tablet, Take 1 tablet (81 mg total) by mouth daily., Disp: 1 tablet, Rfl: 0 .  cycloSPORINE (RESTASIS) 0.05 % ophthalmic emulsion, Place 1 drop into both eyes 2 (two) times daily., Disp: , Rfl:  .  ELDERBERRY PO, Take 10 mLs by mouth daily., Disp: , Rfl:  .  gabapentin (NEURONTIN) 100 MG capsule, Take 100 mg by mouth at bedtime. Has been taking 300 mg daily, Disp: , Rfl:  .  gabapentin (NEURONTIN) 300 MG capsule, TAKE ONE CAPSULE BY MOUTH 3 TIMES A DAY, Disp: 90 capsule, Rfl: 3 .  HYDROcodone-acetaminophen (NORCO) 10-325 MG tablet, Take 1 tablet by mouth every 6 (six) hours as needed for severe pain., Disp: 120 tablet, Rfl: 0 .  levothyroxine (SYNTHROID, LEVOTHROID) 112 MCG tablet, Take 112 mcg by mouth daily before breakfast., Disp: , Rfl:  .  [START ON 11/06/2018] lisdexamfetamine (VYVANSE) 40 MG capsule, Take 1 capsule (40 mg total) by mouth every morning for 30 days., Disp: 30 capsule, Rfl: 0 .  Magnesium 400 MG CAPS, Take by mouth daily., Disp: , Rfl:  .  methocarbamol (ROBAXIN) 500 MG tablet, TAKE 1 TABLET BY MOUTH EVERY 6 HOURS AS NEEDED FOR MUSCLE SPASMS, Disp: 120 tablet, Rfl: 5 .  Multiple Vitamin (MULTIVITAMIN) tablet, Take 1 tablet by mouth daily. One a day for over age 12, Disp: , Rfl:  .  NON FORMULARY, , Disp: , Rfl:  .  phenazopyridine (PYRIDIUM) 100 MG tablet, Take 1 tablet (100 mg total) by mouth 3 (three) times daily as needed for pain. (Patient not taking: Reported on 04/26/2018), Disp: 10  tablet, Rfl: 0 .  phenazopyridine (PYRIDIUM) 100 MG tablet, Take 1 tablet (100 mg total) by mouth 3 (three) times daily as needed for pain. (Patient not taking: Reported on 08/02/2018),  Disp: 10 tablet, Rfl: 0 .  PROAIR RESPICLICK 978 (90 Base) MCG/ACT AEPB, daily as needed., Disp: , Rfl: 0 .  Probiotic Product (PROBIOTIC DAILY PO), Take by mouth., Disp: , Rfl:  .  traZODone (DESYREL) 100 MG tablet, TAKE 2 TABLETS EVERY NIGHT, Disp: 180 tablet, Rfl: 3 .  UNABLE TO FIND, CBD oil 20 mg - half a dropper at bedtime for pain or scalp psoriases, Disp: , Rfl:   EXAM:  VITALS per patient if applicable:  GENERAL: alert, oriented, appears well and in no acute distress  HEENT: atraumatic, conjunttiva clear, no obvious abnormalities on inspection of external nose and ears  NECK: normal movements of the head and neck  LUNGS: on inspection no signs of respiratory distress, breathing rate appears normal, no obvious gross SOB, gasping or wheezing  CV: no obvious cyanosis  MS: moves all visible extremities without noticeable abnormality  PSYCH/NEURO: pleasant and cooperative, no obvious depression or anxiety, speech and thought processing grossly intact  ASSESSMENT AND PLAN: For the allergies and for probable eustachian tube dysfunction, she will take Zyrtec D bid as needed. This has led to some vestibular vertigo, so she can use Meclizine prn. She will drink plenty of fluids. For the headaches, we will add Gabapentin 100 mg that she can use prn in addition to the 300 mg doses. Finally for sleep she will stop the Trazodone and try Temazepam 30 mg qhs.  [name], MD  Discussed the following assessment and plan:  No diagnosis found.     I discussed the assessment and treatment plan with the patient. The patient was provided an opportunity to ask questions and all were answered. The patient agreed with the plan and demonstrated an understanding of the instructions.   The patient was advised to  call back or seek an in-person evaluation if the symptoms worsen or if the condition fails to improve as anticipated.     Review of Systems     Objective:   Physical Exam        Assessment & Plan:

## 2018-10-25 NOTE — Telephone Encounter (Signed)
PA has been approved

## 2018-12-07 ENCOUNTER — Ambulatory Visit
Admission: RE | Admit: 2018-12-07 | Discharge: 2018-12-07 | Disposition: A | Discharge status: Home / Self Care | Payer: 59 | Source: Ambulatory Visit | Attending: Adult Health | Admitting: Adult Health

## 2018-12-07 ENCOUNTER — Other Ambulatory Visit: Payer: Self-pay

## 2018-12-07 DIAGNOSIS — C50411 Malignant neoplasm of upper-outer quadrant of right female breast: Secondary | ICD-10-CM

## 2018-12-07 DIAGNOSIS — Z170 Estrogen receptor positive status [ER+]: Secondary | ICD-10-CM

## 2018-12-26 ENCOUNTER — Other Ambulatory Visit: Payer: Self-pay | Admitting: Family Medicine

## 2018-12-29 NOTE — Telephone Encounter (Signed)
Patient calling to check the status of getting refills. States that she had not used all the refills available, but the prescription is expired so her pharmacy will not refill it. Please advise. Would like a call once Dr Sarajane Jews advises.

## 2019-01-02 ENCOUNTER — Other Ambulatory Visit: Payer: Self-pay | Admitting: Family Medicine

## 2019-01-02 ENCOUNTER — Encounter: Payer: Self-pay | Admitting: Family Medicine

## 2019-01-03 NOTE — Telephone Encounter (Signed)
Cancel the Xanax 1 mg. Instead call in Xanax 0.5 mg to take bid prn anxiety, #[phone] with 5 rf

## 2019-01-09 ENCOUNTER — Ambulatory Visit (INDEPENDENT_AMBULATORY_CARE_PROVIDER_SITE_OTHER): Payer: 59 | Admitting: Family Medicine

## 2019-01-09 ENCOUNTER — Other Ambulatory Visit: Payer: Self-pay

## 2019-01-09 ENCOUNTER — Encounter: Payer: Self-pay | Admitting: Family Medicine

## 2019-01-09 DIAGNOSIS — F900 Attention-deficit hyperactivity disorder, predominantly inattentive type: Secondary | ICD-10-CM | POA: Diagnosis not present

## 2019-01-09 DIAGNOSIS — J301 Allergic rhinitis due to pollen: Secondary | ICD-10-CM

## 2019-01-09 DIAGNOSIS — M1990 Unspecified osteoarthritis, unspecified site: Secondary | ICD-10-CM

## 2019-01-09 DIAGNOSIS — K219 Gastro-esophageal reflux disease without esophagitis: Secondary | ICD-10-CM

## 2019-01-09 DIAGNOSIS — F419 Anxiety disorder, unspecified: Secondary | ICD-10-CM | POA: Diagnosis not present

## 2019-01-09 MED ORDER — LISDEXAMFETAMINE DIMESYLATE 40 MG PO CAPS: 40.0000 mg | ORAL_CAPSULE | ORAL | 0 refills | Status: DC

## 2019-01-09 MED ORDER — CELEBREX 100 MG PO CAPS: 100.0000 mg | ORAL_CAPSULE | Freq: Two times a day (BID) | ORAL | 11 refills | Status: DC

## 2019-01-09 MED ORDER — PHENYLEPHRINE HCL 10 MG PO TABS: 10.0000 mg | ORAL_TABLET | ORAL | 0 refills | Status: DC | PRN

## 2019-01-09 MED ORDER — ALPRAZOLAM 0.5 MG PO TABS: 0.5000 mg | ORAL_TABLET | Freq: Two times a day (BID) | ORAL | 5 refills | Status: DC | PRN

## 2019-01-09 MED ORDER — CETIRIZINE HCL 10 MG PO TABS: 10.0000 mg | ORAL_TABLET | Freq: Two times a day (BID) | ORAL | 0 refills | Status: DC

## 2019-01-09 NOTE — Progress Notes (Signed)
Subjective:    Patient ID: Kristina Pearson, female    DOB: 1955/07/09, 63 y.o.   MRN: [id number]  HPI Virtual Visit via Video Note  I connected with the patient on 01/09/19 at  3:30 PM EDT by a video enabled telemedicine application and verified that I am speaking with the correct person using two identifiers.  Location patient: home Location provider:work or home office Persons participating in the virtual visit: patient, provider  I discussed the limitations of evaluation and management by telemedicine and the availability of in person appointments. The patient expressed understanding and agreed to proceed.   HPI: Here for several issues. First we had seen her in May for vertigo, and this has improved quite a bit since then. She still has mild bouts of dizziness at times. She has gotten excellent results from taking Sudafed D, but she asks for an alternative that does not contain pseudoephedrine. Because this is controlled, she is not allowed to get it via the drive through window and she is forced to go into the pharmacy to get it. Because of the viral pandemic she is very worried about going into the building. Second she wants to try name brand Celebrex again for her arthritis. Meloxicam and even generic Celebrex bother her GERD so much that she cannot take these, but she tolerates name brand Celebrex quite well. Third she needs her Vyvanse refilled. Her ADHD has been stable. Lastly she wants to go back on the 0.5 mg dose of Xanax.    ROS: See pertinent positives and negatives per HPI.  Past Medical History:  Diagnosis Date  . ADHD (attention deficit hyperactivity disorder)    ADD  . Allergy   . Arthritis    osteoarthritrs  . Breast cancer ([place]) 08/15/13   right lateral upper outer  . Fibromyalgia   . GERD (gastroesophageal reflux disease)   . Headache(784.0)   . History of depression   . Hx of radiation therapy 10/19/13- 11/09/13   right breast 4256 cGy in 16 sessions,  hypo-fractionated  . Hypothyroid   . Migraine   . Osteopenia   . Personal history of radiation therapy 2015  . PONV (postoperative nausea and vomiting)   . Sialoadenitis   . Spinal stenosis   . Urinary incontinence   . Vitiligo   . Wears glasses     Past Surgical History:  Procedure Laterality Date  . BREAST BIOPSY Right 07/2013  . BREAST LUMPECTOMY Right 08/2013  . BREAST LUMPECTOMY WITH RADIOACTIVE SEED LOCALIZATION Right 08/30/2013   Procedure: RIGHT PARTIAL MASTECTOMY WITH RADIOACTIVE SEED LOCALIZATION;  Surgeon: [name], MD;  Location: [place];  Service: General;  Laterality: Right;  . BREAST SURGERY    . CHOLECYSTECTOMY  1996   lap  . COLONOSCOPY  2007   per Dr. Cristina Gong, clear, repeat in 10 yrs  . DIAGNOSTIC LAPAROSCOPY  1982   exp  . LAPAROSCOPIC OVARIAN CYSTECTOMY    . URETHRAL DILATION      Family History  Problem Relation Age of Onset  . Arthritis Mother   . Diabetes Mother   . Osteoporosis Mother   . Polymyalgia rheumatica Mother        on prednisone  . Heart disease Mother   . Hyperlipidemia Mother   . Hypertension Mother   . Stroke Mother   . Heart attack Father   . Heart disease Father   . Colon cancer Maternal Uncle        dx  in his 78s  . Colon cancer Cousin 79       mother's maternal first cousin  . Breast cancer Cousin 39       BRCA negative; father's maternal first cousin  . Leukemia Maternal Uncle 81       AML  . Melanoma Maternal Uncle 73  . Leukemia Maternal Uncle 71  . Ovarian cancer Cousin 19       maternal cousin  . Ovarian cancer Cousin 24       maternal cousin  . Leukemia Cousin        dx in his 79s; maternal cousin; thought to be the result of taking [place]  . Heart attack Paternal Uncle   . Colon cancer Cousin 74       maternal second cousin  . Breast cancer Other        maternal great aunt dx in her 70s  . Ovarian cancer Other        maternal grandfather's sister dx in her 28s  . Colon cancer  Other        maternal grandfather's sister dx in her 68s  . Colon cancer Other        maternal grandfather's sister dx in her 85s  . Other Daughter        dermoid tumor  . Diabetes Paternal Grandmother   . Skin cancer Paternal Grandmother   . Hypertension Paternal Grandmother      Current Outpatient Medications:  .  ALPRAZolam (XANAX) 0.5 MG tablet, Take 1 tablet (0.5 mg total) by mouth 2 (two) times daily as needed for anxiety., Disp: 60 tablet, Rfl: 5 .  aspirin EC 81 MG tablet, Take 1 tablet (81 mg total) by mouth daily., Disp: 1 tablet, Rfl: 0 .  CELEBREX 100 MG capsule, Take 1 capsule (100 mg total) by mouth 2 (two) times daily., Disp: 60 capsule, Rfl: 11 .  cetirizine (ZYRTEC) 10 MG tablet, Take 1 tablet (10 mg total) by mouth 2 (two) times daily., Disp: 2 tablet, Rfl: 0 .  cycloSPORINE (RESTASIS) 0.05 % ophthalmic emulsion, Place 1 drop into both eyes 2 (two) times daily., Disp: , Rfl:  .  ELDERBERRY PO, Take 10 mLs by mouth daily., Disp: , Rfl:  .  gabapentin (NEURONTIN) 100 MG capsule, Take 1 capsule (100 mg total) by mouth 3 (three) times daily as needed (headaches). Has been taking 300 mg daily, Disp: 90 capsule, Rfl: 5 .  gabapentin (NEURONTIN) 300 MG capsule, TAKE ONE CAPSULE BY MOUTH 3 TIMES A DAY, Disp: 90 capsule, Rfl: 3 .  HYDROcodone-acetaminophen (NORCO) 10-325 MG tablet, Take 1 tablet by mouth every 6 (six) hours as needed for severe pain., Disp: 120 tablet, Rfl: 0 .  levothyroxine (SYNTHROID, LEVOTHROID) 112 MCG tablet, Take 112 mcg by mouth daily before breakfast., Disp: , Rfl:  .  [START ON 03/12/2019] lisdexamfetamine (VYVANSE) 40 MG capsule, Take 1 capsule (40 mg total) by mouth every morning., Disp: 30 capsule, Rfl: 0 .  Magnesium 400 MG CAPS, Take by mouth daily., Disp: , Rfl:  .  meclizine (ANTIVERT) 25 MG tablet, Take 1 tablet (25 mg total) by mouth every 4 (four) hours as needed for dizziness., Disp: 60 tablet, Rfl: 5 .  methocarbamol (ROBAXIN) 500 MG tablet,  TAKE 1 TABLET BY MOUTH EVERY 6 HOURS AS NEEDED FOR MUSCLE SPASMS, Disp: 120 tablet, Rfl: 5 .  Multiple Vitamin (MULTIVITAMIN) tablet, Take 1 tablet by mouth daily. One a day for over age 22, 72: ,  Rfl:  .  NON FORMULARY, , Disp: , Rfl:  .  phenazopyridine (PYRIDIUM) 100 MG tablet, Take 1 tablet (100 mg total) by mouth 3 (three) times daily as needed for pain. (Patient not taking: Reported on 04/26/2018), Disp: 10 tablet, Rfl: 0 .  phenazopyridine (PYRIDIUM) 100 MG tablet, Take 1 tablet (100 mg total) by mouth 3 (three) times daily as needed for pain. (Patient not taking: Reported on 08/02/2018), Disp: 10 tablet, Rfl: 0 .  phenylephrine (SUDAFED PE SINUS CONGESTION) 10 MG TABS tablet, Take 1 tablet (10 mg total) by mouth every 4 (four) hours as needed., Disp: 1 tablet, Rfl: 0 .  PROAIR RESPICLICK 830 (90 Base) MCG/ACT AEPB, daily as needed., Disp: , Rfl: 0 .  Probiotic Product (PROBIOTIC DAILY PO), Take by mouth., Disp: , Rfl:  .  temazepam (RESTORIL) 30 MG capsule, Take 1 capsule (30 mg total) by mouth at bedtime as needed for sleep., Disp: 30 capsule, Rfl: 3 .  UNABLE TO FIND, CBD oil 20 mg - half a dropper at bedtime for pain or scalp psoriases, Disp: , Rfl:   EXAM:  VITALS per patient if applicable:  GENERAL: alert, oriented, appears well and in no acute distress  HEENT: atraumatic, conjunttiva clear, no obvious abnormalities on inspection of external nose and ears  NECK: normal movements of the head and neck  LUNGS: on inspection no signs of respiratory distress, breathing rate appears normal, no obvious gross SOB, gasping or wheezing  CV: no obvious cyanosis  MS: moves all visible extremities without noticeable abnormality  PSYCH/NEURO: pleasant and cooperative, no obvious depression or anxiety, speech and thought processing grossly intact  ASSESSMENT AND PLAN: For her allergies and vertigo, she will change to Zyrtec 10 mg BID, and she will add Phenylephrine to this instead of  Pseudoephedrine. That way she can get it in the drive through. We will write for name brand Celebrex to use for arthritis pain. We will refill Vyvanse for her ADHD. For the anxiety we will decrease the Xanax back to the 0.5 mg dose.  [name], MD  Discussed the following assessment and plan:  No diagnosis found.     I discussed the assessment and treatment plan with the patient. The patient was provided an opportunity to ask questions and all were answered. The patient agreed with the plan and demonstrated an understanding of the instructions.   The patient was advised to call back or seek an in-person evaluation if the symptoms worsen or if the condition fails to improve as anticipated.     Review of Systems     Objective:   Physical Exam        Assessment & Plan:

## 2019-02-07 ENCOUNTER — Other Ambulatory Visit: Payer: Self-pay | Admitting: Family Medicine

## 2019-02-07 NOTE — Telephone Encounter (Signed)
Please advise. Rx is not on the patient med list. Should she continue this?

## 2019-02-14 ENCOUNTER — Encounter: Payer: Self-pay | Admitting: Obstetrics & Gynecology

## 2019-02-14 ENCOUNTER — Other Ambulatory Visit: Payer: Self-pay

## 2019-02-14 ENCOUNTER — Ambulatory Visit (INDEPENDENT_AMBULATORY_CARE_PROVIDER_SITE_OTHER): Payer: 59 | Admitting: Obstetrics & Gynecology

## 2019-02-14 VITALS — BP 130/78 | Ht 66.0 in | Wt 162.0 lb

## 2019-02-14 DIAGNOSIS — Z853 Personal history of malignant neoplasm of breast: Secondary | ICD-10-CM | POA: Diagnosis not present

## 2019-02-14 DIAGNOSIS — Z780 Asymptomatic menopausal state: Secondary | ICD-10-CM | POA: Diagnosis not present

## 2019-02-14 DIAGNOSIS — C50411 Malignant neoplasm of upper-outer quadrant of right female breast: Secondary | ICD-10-CM

## 2019-02-14 DIAGNOSIS — N952 Postmenopausal atrophic vaginitis: Secondary | ICD-10-CM

## 2019-02-14 DIAGNOSIS — Z124 Encounter for screening for malignant neoplasm of cervix: Secondary | ICD-10-CM

## 2019-02-14 DIAGNOSIS — M8580 Other specified disorders of bone density and structure, unspecified site: Secondary | ICD-10-CM

## 2019-02-14 DIAGNOSIS — Z01419 Encounter for gynecological examination (general) (routine) without abnormal findings: Secondary | ICD-10-CM | POA: Diagnosis not present

## 2019-02-14 DIAGNOSIS — Z170 Estrogen receptor positive status [ER+]: Secondary | ICD-10-CM

## 2019-02-14 NOTE — Progress Notes (Signed)
Kristina Pearson 1955/12/02 [id number]   History:    63 y.o. G2P1A1L1   RP:  New patient presenting for annual gyn exam   HPI:  H/O Rt Breast Cancer lumpectomy in 2015, Tamoxifen now d/ced.  Had light PMB about 2 months ago, per patient EBx benign, [place] Endometrial line thin.  No pelvic pain.  Dryness and pain with IC.  Urine/BMs normal.  Breasts normal.  BMI 26.15.  Good fitness.  Will come back for Fasting Health Labs.  Past medical history,surgical history, family history and social history were all reviewed and documented in the EPIC chart.  Gynecologic History Patient's last menstrual period was 09/21/2006. Contraception: post menopausal status Last Pap: 10/2016. Results were: Negative Last mammogram: 11/2018. Results were: Benign Bone Density: 08/2017 osteopenia with a T score of -1.7 at the left femoral neck Colonoscopy: 13 yrs ago  Obstetric History OB History  Gravida Para Term Preterm AB Living  2 1       1   SAB TAB Ectopic Multiple Live Births               # Outcome Date GA Lbr Len/2nd Weight Sex Delivery Anes PTL Lv  2 Gravida           1 Para              ROS: A ROS was performed and pertinent positives and negatives are included in the history.  GENERAL: No fevers or chills. HEENT: No change in vision, no earache, sore throat or sinus congestion. NECK: No pain or stiffness. CARDIOVASCULAR: No chest pain or pressure. No palpitations. PULMONARY: No shortness of breath, cough or wheeze. GASTROINTESTINAL: No abdominal pain, nausea, vomiting or diarrhea, melena or bright red blood per rectum. GENITOURINARY: No urinary frequency, urgency, hesitancy or dysuria. MUSCULOSKELETAL: No joint or muscle pain, no back pain, no recent trauma. DERMATOLOGIC: No rash, no itching, no lesions. ENDOCRINE: No polyuria, polydipsia, no heat or cold intolerance. No recent change in weight. HEMATOLOGICAL: No anemia or easy bruising or bleeding. NEUROLOGIC: No headache, seizures, numbness, tingling or  weakness. PSYCHIATRIC: No depression, no loss of interest in normal activity or change in sleep pattern.     Exam:   Ht 5' 6"  (1.676 m)   Wt 162 lb (73.5 kg)   LMP 09/21/2006   BMI 26.15 kg/m   Body mass index is 26.15 kg/m.  General appearance : Well developed well nourished female. No acute distress HEENT: Eyes: no retinal hemorrhage or exudates,  Neck supple, trachea midline, no carotid bruits, no thyroidmegaly Lungs: Clear to auscultation, no rhonchi or wheezes, or rib retractions  Heart: Regular rate and rhythm, no murmurs or gallops Breast:Examined in sitting and supine position were symmetrical in appearance, no palpable masses or tenderness,  no skin retraction, no nipple inversion, no nipple discharge, no skin discoloration, no axillary or supraclavicular lymphadenopathy Abdomen: no palpable masses or tenderness, no rebound or guarding Extremities: no edema or skin discoloration or tenderness  Pelvic: Vulva: Normal             Vagina: No gross lesions or discharge  Cervix: No gross lesions or discharge.  Pap reflex done.  Uterus  AV, normal size, shape and consistency, non-tender and mobile  Adnexa  Without masses or tenderness  Anus: Normal   Assessment/Plan:  63 y.o. female for annual exam   1. Encounter for routine gynecological examination with Papanicolaou smear of cervix Normal gynecologic exam in menopause.  Pap reflex done.  Breast exam normal.  We will follow-up here for fasting health labs. - CBC; Future - Comp Met (CMET); Future - TSH; Future - Lipid panel; Future - VITAMIN D 25 Hydroxy (Vit-D Deficiency, Fractures); Future  2. Postmenopause Well on no hormone replacement therapy.  Light postmenopausal bleeding 2 months ago with negative investigation including a benign endometrial biopsy and a thin endometrial lining.  3. Post-menopausal atrophic vaginitis Recommend coconut oil as needed.  4. Osteopenia, unspecified location Bone density March 2019  showing osteopenia with a T score of -1.7 at the left femoral neck.  We will repeat a bone density at 2 years.  Vitamin D supplements, calcium intake of 1200 mg daily and regular weightbearing physical activity is recommended.  5. Malignant neoplasm of upper-outer quadrant of right breast in female, estrogen receptor positive ([place]) Right breast cancer for which patient had a lumpectomy and tamoxifen.  Finished with tamoxifen.  Last screening mammogram June 2020 was benign.  Princess Bruins MD, 2:30 PM 02/14/2019

## 2019-02-15 LAB — PAP IG W/ RFLX HPV ASCU

## 2019-02-16 ENCOUNTER — Telehealth: Payer: Self-pay | Admitting: Adult Health

## 2019-02-16 ENCOUNTER — Telehealth: Payer: Self-pay | Admitting: *Deleted

## 2019-02-16 ENCOUNTER — Other Ambulatory Visit: Payer: 59

## 2019-02-16 DIAGNOSIS — Z833 Family history of diabetes mellitus: Secondary | ICD-10-CM

## 2019-02-16 NOTE — Telephone Encounter (Signed)
Patient called concerned she noted on her pap smear results it was mentioned predominantly parabasal cells. Patient said she has never noticed this on her pap smear results before. Aware pap smear is norma. Please advise

## 2019-02-16 NOTE — Telephone Encounter (Signed)
I talk with patient regarding video visit °

## 2019-02-16 NOTE — Telephone Encounter (Signed)
Normal finding in menopause.

## 2019-02-16 NOTE — Telephone Encounter (Signed)
Patient informed. 

## 2019-02-16 NOTE — Telephone Encounter (Signed)
Patient informed with below would like Hemoglubin A1C drawn as well due to family history of DM. Order placed.

## 2019-02-16 NOTE — Telephone Encounter (Signed)
"  Dewayne Hatch Younan calling to get a Tele-med appointment with Dr. Lindi Adie or his NP I've been seeing named Mendel Ryder.  Please call back my cell phone number [phone]."

## 2019-02-16 NOTE — Telephone Encounter (Signed)
Please call patient and triage why she needs appointment and what is going on.  I am happy to see her regardless, I just want some more information.  I have openings tomorrow at 10 and 2.    Thanks,  Mendel Ryder

## 2019-02-16 NOTE — Telephone Encounter (Signed)
Received call from pt stating she is experiencing negative side effect from antiestrogen therapy including severe vaginal dryness.  Pt to see Wilber Bihari, NP tomorrow via [facility] video visit at 2pm.  Message sent to scheduling.

## 2019-02-17 ENCOUNTER — Encounter: Payer: Self-pay | Admitting: Adult Health

## 2019-02-17 ENCOUNTER — Inpatient Hospital Stay: Payer: 59 | Attending: Adult Health | Admitting: Adult Health

## 2019-02-17 DIAGNOSIS — Z79899 Other long term (current) drug therapy: Secondary | ICD-10-CM

## 2019-02-17 DIAGNOSIS — C50411 Malignant neoplasm of upper-outer quadrant of right female breast: Secondary | ICD-10-CM

## 2019-02-17 DIAGNOSIS — Z923 Personal history of irradiation: Secondary | ICD-10-CM | POA: Diagnosis not present

## 2019-02-17 DIAGNOSIS — Z170 Estrogen receptor positive status [ER+]: Secondary | ICD-10-CM

## 2019-02-17 DIAGNOSIS — N941 Unspecified dyspareunia: Secondary | ICD-10-CM

## 2019-02-17 MED ORDER — ESTRADIOL 0.1 MG/GM VA CREA: 1.0000 | TOPICAL_CREAM | VAGINAL | 12 refills | Status: DC

## 2019-02-17 NOTE — Progress Notes (Signed)
[facility] Cancer Follow up:    Fry, Stephen A, MD [address] [place] [address]   DIAGNOSIS: Cancer Staging Breast cancer of upper-outer quadrant of right female breast (HCC) Staging form: Breast, AJCC 7th Edition - Clinical: Stage 0 (Tis, N0, cM0) - Unsigned Staging comments: Staged at breast conference 08/23/13  - Pathologic: No stage assigned - Unsigned  I connected with Hyacinth Mcneely on 02/17/19 at  2:00 PM EDT by my chart video and verified that I am speaking with the correct person using two identifiers.  I discussed the limitations, risks, security and privacy concerns of performing an evaluation and management service by virtually and the availability of in person appointments.  I also discussed with the patient that there may be a patient responsible charge related to this service. The patient expressed understanding and agreed to proceed.   SUMMARY OF ONCOLOGIC HISTORY: Oncology History  Breast cancer of upper-outer quadrant of right female breast (HCC)  08/15/2013 Initial Diagnosis   DCIS with calcifications   08/30/2013 Surgery   Right breast lumpectomy: No evidence of residual DCIS found to have atypical lobular hyperplasia   09/26/2013 - 11/09/2013 Radiation Therapy   Adjuvant radiation therapy by Dr. Murray   11/21/2013 - 08/22/2015 Anti-estrogen oral therapy   Tamoxifen 20 mg daily stopped because patient was having problems with lack of interest in life which she attributed to tamoxifen.     CURRENT THERAPY:observation  INTERVAL HISTORY: Kristina Pearson 63 y.o. female requested an urgent appointment about her vaginal dryness.  She is miserable.  She has vulvar pain, and vaginal atrophy.  Dysparuenia is worsening.  She has tried Replens which caused irritation.  She uses Vitamin E suppositories which helped some.  She is having a hard time controlling her urine and intimacy has become problematic.  She is frustrated, and has a decreased QOL due to  this.  She wants to know if she can use a small amount of estrogen cream since she had DCIS and even though it was ER positive, it was so small it was all removed during the core needle biopsy.   She notes that she had some vaginal bleeding over the summer and saw Dr. Adkins.  A biopsy was negative, and her uterine lining was not thickened on ultrasound.     Patient Active Problem List   Diagnosis Date Noted  . Anxiety disorder 01/09/2019  . Cervical stenosis of spine 10/21/2018  . Insomnia 10/21/2018  . Breast tenderness in female 02/05/2014  . Pes planus of both feet 01/15/2014  . Low back pain 11/23/2013  . Phlebitis 10/06/2013  . History of depression 09/26/2013  . Breast cancer of upper-outer quadrant of right female breast (HCC) 08/18/2013  . ADHD (attention deficit hyperactivity disorder) 12/01/2011  . Headache 04/03/2009  . Hypothyroidism 05/05/2007  . Allergic rhinitis 05/05/2007  . GERD 05/05/2007  . Osteoarthritis 05/05/2007    has No Known Allergies.  MEDICAL HISTORY: Past Medical History:  Diagnosis Date  . ADHD (attention deficit hyperactivity disorder)    ADD  . Allergy   . Arthritis    osteoarthritrs  . Breast cancer (HCC) 08/15/13   right lateral upper outer  . Fibromyalgia   . GERD (gastroesophageal reflux disease)   . Headache(784.0)   . History of depression   . Hx of radiation therapy 10/19/13- 11/09/13   right breast 4256 cGy in 16 sessions, hypo-fractionated  . Hypothyroid   . Migraine   . Osteopenia   .   Personal history of radiation therapy 2015  . PONV (postoperative nausea and vomiting)   . Sialoadenitis   . Spinal stenosis   . Urinary incontinence   . Vitiligo   . Wears glasses     SURGICAL HISTORY: Past Surgical History:  Procedure Laterality Date  . BREAST BIOPSY Right 07/2013  . BREAST LUMPECTOMY Right 08/2013  . BREAST LUMPECTOMY WITH RADIOACTIVE SEED LOCALIZATION Right 08/30/2013   Procedure: RIGHT PARTIAL MASTECTOMY WITH  RADIOACTIVE SEED LOCALIZATION;  Surgeon: Haywood M Ingram, MD;  Location: [facility];  Service: General;  Laterality: Right;  . BREAST SURGERY    . CHOLECYSTECTOMY  1996   lap  . COLONOSCOPY  2007   per Dr. Buccini, clear, repeat in 10 yrs  . DIAGNOSTIC LAPAROSCOPY  1982   exp  . LAPAROSCOPIC OVARIAN CYSTECTOMY    . URETHRAL DILATION      SOCIAL HISTORY: Social History   Socioeconomic History  . Marital status: Married    Spouse name: Not on file  . Number of children: 1  . Years of education: Not on file  . Highest education level: Not on file  Occupational History  . Not on file  Social Needs  . Financial resource strain: Not on file  . Food insecurity    Worry: Not on file    Inability: Not on file  . Transportation needs    Medical: Not on file    Non-medical: Not on file  Tobacco Use  . Smoking status: Former Smoker    Quit date: 09/14/1978    Years since quitting: 40.4  . Smokeless tobacco: Never Used  . Tobacco comment: short time in college  Substance and Sexual Activity  . Alcohol use: Yes    Alcohol/week: 1.0 - 2.0 standard drinks    Types: 1 - 2 Standard drinks or equivalent per week  . Drug use: No  . Sexual activity: Yes    Partners: Male    Birth control/protection: Post-menopausal    Comment: menarche age 11, first live birth age 37, menopause age 50, no HRT  Lifestyle  . Physical activity    Days per week: Not on file    Minutes per session: Not on file  . Stress: Not on file  Relationships  . Social connections    Talks on phone: Not on file    Gets together: Not on file    Attends religious service: Not on file    Active member of club or organization: Not on file    Attends meetings of clubs or organizations: Not on file    Relationship status: Not on file  . Intimate partner violence    Fear of current or ex partner: Not on file    Emotionally abused: Not on file    Physically abused: Not on file    Forced sexual  activity: Not on file  Other Topics Concern  . Not on file  Social History Narrative   Lives with husband, daughter and mother in a 2 story home.  Has one child.  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  Education: college degree    FAMILY HISTORY: Family History  Problem Relation Age of Onset  . Arthritis Mother   . Diabetes Mother   . Osteoporosis Mother   . Polymyalgia rheumatica Mother        on prednisone  . Heart disease Mother   . Hyperlipidemia Mother   . Hypertension Mother   .   Stroke Mother   . Heart attack Father   . Heart disease Father   . Colon cancer Maternal Uncle        dx in his 45s  . Colon cancer Cousin 91       mother's maternal first cousin  . Breast cancer Cousin 76       BRCA negative; father's maternal first cousin  . Leukemia Maternal Uncle 81       AML  . Melanoma Maternal Uncle 73  . Leukemia Maternal Uncle 70  . Ovarian cancer Cousin 49       maternal cousin  . Ovarian cancer Cousin 24       maternal cousin  . Leukemia Cousin        dx in his 33s; maternal cousin; thought to be the result of taking [place]  . Heart attack Paternal Uncle   . Colon cancer Cousin 1       maternal second cousin  . Breast cancer Other        maternal great aunt dx in her 48s  . Ovarian cancer Other        maternal grandfather's sister dx in her 56s  . Colon cancer Other        maternal grandfather's sister dx in her 57s  . Colon cancer Other        maternal grandfather's sister dx in her 54s  . Other Daughter        dermoid tumor  . Diabetes Paternal Grandmother   . Skin cancer Paternal Grandmother   . Hypertension Paternal Grandmother     Review of Systems  Constitutional: Negative for appetite change, chills, fatigue and unexpected weight change.  HENT:   Negative for hearing loss, lump/mass, mouth sores, sore throat and trouble swallowing.   Eyes: Negative for eye problems and icterus.  Respiratory: Negative for chest tightness, cough  and shortness of breath.   Cardiovascular: Negative for chest pain, leg swelling and palpitations.  Gastrointestinal: Negative for abdominal distention, abdominal pain, constipation, diarrhea, nausea and vomiting.  Endocrine: Negative for hot flashes.  Musculoskeletal: Negative for arthralgias.  Skin: Negative for itching and rash.  Neurological: Negative for dizziness, extremity weakness, headaches and numbness.  Hematological: Negative for adenopathy. Does not bruise/bleed easily.  Psychiatric/Behavioral: Negative for depression. The patient is not nervous/anxious.       PHYSICAL EXAMINATION  ECOG PERFORMANCE STATUS: 1 - Symptomatic but completely ambulatory Patient appears anxious, no acute distress.  Breathing non labored.  Skin visualized is without rash or lesion.  LABORATORY DATA:  CBC    Component Value Date/Time   WBC 4.7 03/18/2018 1124   RBC 4.04 03/18/2018 1124   HGB 13.3 03/18/2018 1124   HGB 12.0 02/21/2014 1111   HCT 39.1 03/18/2018 1124   HCT 36.4 02/21/2014 1111   PLT 227.0 03/18/2018 1124   PLT 177 02/21/2014 1111   MCV 96.8 03/18/2018 1124   MCV 97.7 02/21/2014 1111   MCH 32.1 04/13/2016 1631   MCHC 34.0 03/18/2018 1124   RDW 12.8 03/18/2018 1124   RDW 12.7 02/21/2014 1111   LYMPHSABS 1.4 03/18/2018 1124   LYMPHSABS 1.0 02/21/2014 1111   MONOABS 0.4 03/18/2018 1124   MONOABS 0.3 02/21/2014 1111   EOSABS 0.2 03/18/2018 1124   EOSABS 0.2 02/21/2014 1111   BASOSABS 0.1 03/18/2018 1124   BASOSABS 0.0 02/21/2014 1111    CMP     Component Value Date/Time   NA 140 03/18/2018 1124   NA 139  02/21/2014 1111   K 4.7 03/18/2018 1124   K 4.4 02/21/2014 1111   CL 104 03/18/2018 1124   CO2 30 03/18/2018 1124   CO2 27 02/21/2014 1111   GLUCOSE 93 03/18/2018 1124   GLUCOSE 99 02/21/2014 1111   BUN 15 03/18/2018 1124   BUN 16.4 02/21/2014 1111   CREATININE 0.92 03/18/2018 1124   CREATININE 0.88 10/02/2015 1033   CREATININE 1.0 02/21/2014 1111   CALCIUM  9.7 03/18/2018 1124   CALCIUM 9.0 02/21/2014 1111   PROT 6.6 03/18/2018 1124   PROT 6.2 (L) 02/21/2014 1111   ALBUMIN 4.2 03/18/2018 1124   ALBUMIN 3.4 (L) 02/21/2014 1111   AST 22 03/18/2018 1124   AST 21 02/21/2014 1111   ALT 12 03/18/2018 1124   ALT 22 02/21/2014 1111   ALKPHOS 47 03/18/2018 1124   ALKPHOS 33 (L) 02/21/2014 1111   BILITOT 0.6 03/18/2018 1124   BILITOT 0.47 02/21/2014 1111   GFRNONAA 65 (L) 09/30/2014 1216   GFRAA 75 (L) 09/30/2014 1216          ASSESSMENT and THERAPY PLAN:   Breast cancer 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  We talked through the risks of estrace cream, and I think it is reasonable for her to use a small amount intravaginally three times a week.  She understands that it may perhaps increase her estrogen levels in her body.  But she is correct, her DCIS was detected very early and was very small.  She is going to use the vitamin E suppositories as well.  I also recommended pelvic rehab to her.  She agreed, and I placed the referral.  We will see Ronniesha back in 07/2019 for her LTS follow up.  She was recommended to continue with the appropriate pandemic precautions. She knows to call for any questions that may arise between now and her next appointment.  We are happy to see her sooner if needed.    Orders Placed This Encounter  Procedures  . Ambulatory referral to Physical Therapy    Referral Priority:   Urgent    Referral Type:   Physical Medicine    Referral Reason:   Specialty Services Required    Requested Specialty:   Physical Therapy    Number of Visits Requested:   1   Follow up instructions:    -Return to cancer center 07/2019     I provided 25  minutes of face-to-face video visit time during this encounter, and > 50% was spent counseling as documented under my assessment & plan.  Scot Dock, NP 02/17/2019

## 2019-02-17 NOTE — Assessment & Plan Note (Signed)
Right breast DCIS ER/PR positive: Patient is currently on antiestrogen therapy with tamoxifen  June 2015 -March 2017 stopped early due to lack of energy and decreased interest in life or activities  Daysy is having significant vaginal dryness and decreased quality of life.  We talked through the risks of estrace cream, and I think it is reasonable for her to use a small amount intravaginally three times a week.  She understands that it may perhaps increase her estrogen levels in her body.  But she is correct, her DCIS was detected very early and was very small.  She is going to use the vitamin E suppositories as well.  I also recommended pelvic rehab to her.  She agreed, and I placed the referral.  We will see Dova back in 07/2019 for her LTS follow up.  She was recommended to continue with the appropriate pandemic precautions. She knows to call for any questions that may arise between now and her next appointment.  We are happy to see her sooner if needed.

## 2019-02-18 ENCOUNTER — Encounter: Payer: Self-pay | Admitting: Obstetrics & Gynecology

## 2019-02-18 NOTE — Patient Instructions (Signed)
1. Encounter for routine gynecological examination with Papanicolaou smear of cervix Normal gynecologic exam in menopause.  Pap reflex done.  Breast exam normal.  We will follow-up here for fasting health labs. - CBC; Future - Comp Met (CMET); Future - TSH; Future - Lipid panel; Future - VITAMIN D 25 Hydroxy (Vit-D Deficiency, Fractures); Future  2. Postmenopause Well on no hormone replacement therapy.  Light postmenopausal bleeding 2 months ago with negative investigation including a benign endometrial biopsy and a thin endometrial lining.  3. Post-menopausal atrophic vaginitis Recommend coconut oil as needed.  4. Osteopenia, unspecified location Bone density March 2019 showing osteopenia with a T score of -1.7 at the left femoral neck.  We will repeat a bone density at 2 years.  Vitamin D supplements, calcium intake of 1200 mg daily and regular weightbearing physical activity is recommended.  5. Malignant neoplasm of upper-outer quadrant of right breast in female, estrogen receptor positive ([place]) Right breast cancer for which patient had a lumpectomy and tamoxifen.  Finished with tamoxifen.  Last screening mammogram June 2020 was benign.  Suzie, it was a pleasure seeing you today!  I will inform you of your results as soon as they are available.

## 2019-03-07 ENCOUNTER — Ambulatory Visit: Payer: 59 | Admitting: Physical Therapy

## 2019-04-28 ENCOUNTER — Telehealth: Payer: Self-pay | Admitting: Family Medicine

## 2019-04-28 NOTE — Telephone Encounter (Signed)
Medication Refill - Medication: lisdexamfetamine (VYVANSE) 40 MG capsule(Expired)  Preferred Pharmacy:  [id number] [address], [place] [address]  Phone: [phone] Fax: [phone]     Pt was advised that RX refills may take up to 3 business days. We ask that you follow-up with your pharmacy.

## 2019-05-01 MED ORDER — LISDEXAMFETAMINE DIMESYLATE 40 MG PO CAPS: 40.0000 mg | ORAL_CAPSULE | ORAL | 0 refills | Status: DC

## 2019-05-01 NOTE — Telephone Encounter (Signed)
Okay for refill?  

## 2019-05-01 NOTE — Telephone Encounter (Signed)
Done

## 2019-05-03 ENCOUNTER — Encounter: Payer: Self-pay | Admitting: Adult Health

## 2019-05-09 ENCOUNTER — Other Ambulatory Visit: Payer: Self-pay | Admitting: Family Medicine

## 2019-05-09 NOTE — Telephone Encounter (Signed)
Ok for refill? 

## 2019-05-16 ENCOUNTER — Other Ambulatory Visit: Payer: Self-pay

## 2019-05-16 ENCOUNTER — Telehealth (INDEPENDENT_AMBULATORY_CARE_PROVIDER_SITE_OTHER): Payer: 59 | Admitting: Family Medicine

## 2019-05-16 ENCOUNTER — Encounter: Payer: Self-pay | Admitting: Family Medicine

## 2019-05-16 DIAGNOSIS — M1990 Unspecified osteoarthritis, unspecified site: Secondary | ICD-10-CM

## 2019-05-16 DIAGNOSIS — M4802 Spinal stenosis, cervical region: Secondary | ICD-10-CM

## 2019-05-16 MED ORDER — DICLOFENAC SODIUM 75 MG PO TBEC: 75.0000 mg | DELAYED_RELEASE_TABLET | Freq: Two times a day (BID) | ORAL | 5 refills | Status: DC

## 2019-05-16 NOTE — Patient Instructions (Signed)
Health Maintenance Due  Topic Date Due  . HIV Screening  10/03/1970  . COLONOSCOPY  10/03/2015  . INFLUENZA VACCINE  01/21/2019    Depression screen PHQ 2/9 06/08/2015  Decreased Interest 0  Down, Depressed, Hopeless 0  PHQ - 2 Score 0  Some recent data might be hidden

## 2019-05-16 NOTE — Progress Notes (Signed)
Virtual Visit via Video Note  I connected with the patient on 05/16/19 at 11:00 AM EST by a video enabled telemedicine application and verified that I am speaking with the correct person using two identifiers.  Location patient: home Location provider:work or home office Persons participating in the virtual visit: patient, provider  I discussed the limitations of evaluation and management by telemedicine and the availability of in person appointments. The patient expressed understanding and agreed to proceed.   HPI: Here for several weeks of pain that starts in the neck and radiates down both arms to the hands. No numbness or weakness. She is taking Gabapentin 100 mg TID and some Tylenol with mixed results. She had hoped to get back on Celebrex, but her insurance company will not cover this.    ROS: See pertinent positives and negatives per HPI.  Past Medical History:  Diagnosis Date  . ADHD (attention deficit hyperactivity disorder)    ADD  . Allergy   . Arthritis    osteoarthritrs  . Breast cancer ([place]) 08/15/13   right lateral upper outer  . Fibromyalgia   . GERD (gastroesophageal reflux disease)   . Headache(784.0)   . History of depression   . Hx of radiation therapy 10/19/13- 11/09/13   right breast 4256 cGy in 16 sessions, hypo-fractionated  . Hypothyroid   . Migraine   . Osteopenia   . Personal history of radiation therapy 2015  . PONV (postoperative nausea and vomiting)   . Sialoadenitis   . Spinal stenosis   . Urinary incontinence   . Vitiligo   . Wears glasses     Past Surgical History:  Procedure Laterality Date  . BREAST BIOPSY Right 07/2013  . BREAST LUMPECTOMY Right 08/2013  . BREAST LUMPECTOMY WITH RADIOACTIVE SEED LOCALIZATION Right 08/30/2013   Procedure: RIGHT PARTIAL MASTECTOMY WITH RADIOACTIVE SEED LOCALIZATION;  Surgeon: [name], MD;  Location: [facility];  Service: General;  Laterality: Right;  . BREAST SURGERY    .  CHOLECYSTECTOMY  1996   lap  . COLONOSCOPY  2007   per Dr. Cristina Gong, clear, repeat in 10 yrs  . DIAGNOSTIC LAPAROSCOPY  1982   exp  . LAPAROSCOPIC OVARIAN CYSTECTOMY    . URETHRAL DILATION      Family History  Problem Relation Age of Onset  . Arthritis Mother   . Diabetes Mother   . Osteoporosis Mother   . Polymyalgia rheumatica Mother        on prednisone  . Heart disease Mother   . Hyperlipidemia Mother   . Hypertension Mother   . Stroke Mother   . Heart attack Father   . Heart disease Father   . Colon cancer Maternal Uncle        dx in his 21s  . Colon cancer Cousin 97       mother's maternal first cousin  . Breast cancer Cousin 58       BRCA negative; father's maternal first cousin  . Leukemia Maternal Uncle 81       AML  . Melanoma Maternal Uncle 73  . Leukemia Maternal Uncle 40  . Ovarian cancer Cousin 32       maternal cousin  . Ovarian cancer Cousin 24       maternal cousin  . Leukemia Cousin        dx in his 39s; maternal cousin; thought to be the result of taking [place]  . Heart attack Paternal Uncle   . Colon  cancer Cousin 43       maternal second cousin  . Breast cancer Other        maternal great aunt dx in her 82s  . Ovarian cancer Other        maternal grandfather's sister dx in her 87s  . Colon cancer Other        maternal grandfather's sister dx in her 77s  . Colon cancer Other        maternal grandfather's sister dx in her 7s  . Other Daughter        dermoid tumor  . Diabetes Paternal Grandmother   . Skin cancer Paternal Grandmother   . Hypertension Paternal Grandmother      Current Outpatient Medications:  .  ALPRAZolam (XANAX) 0.5 MG tablet, Take 1 tablet (0.5 mg total) by mouth 2 (two) times daily as needed for anxiety., Disp: 60 tablet, Rfl: 5 .  aspirin EC 81 MG tablet, Take 1 tablet (81 mg total) by mouth daily., Disp: 1 tablet, Rfl: 0 .  cetirizine (ZYRTEC) 10 MG tablet, Take 1 tablet (10 mg total) by mouth 2 (two) times daily.,  Disp: 2 tablet, Rfl: 0 .  cycloSPORINE (RESTASIS) 0.05 % ophthalmic emulsion, Place 1 drop into both eyes 2 (two) times daily., Disp: , Rfl:  .  estradiol (ESTRACE VAGINAL) 0.1 MG/GM vaginal cream, Place 1 Applicatorful vaginally 3 (three) times a week., Disp: 42.5 g, Rfl: 12 .  gabapentin (NEURONTIN) 100 MG capsule, Take 1 capsule (100 mg total) by mouth 3 (three) times daily as needed (headaches). Has been taking 300 mg daily, Disp: 90 capsule, Rfl: 5 .  HYDROcodone-acetaminophen (NORCO) 10-325 MG tablet, Take 1 tablet by mouth every 6 (six) hours as needed for severe pain., Disp: 120 tablet, Rfl: 0 .  levothyroxine (SYNTHROID, LEVOTHROID) 112 MCG tablet, Take 112 mcg by mouth daily before breakfast., Disp: , Rfl:  .  [START ON 07/01/2019] lisdexamfetamine (VYVANSE) 40 MG capsule, Take 1 capsule (40 mg total) by mouth every morning., Disp: 30 capsule, Rfl: 0 .  Magnesium 400 MG CAPS, Take by mouth daily., Disp: , Rfl:  .  methocarbamol (ROBAXIN) 500 MG tablet, TAKE 1 TABLET BY MOUTH EVERY 6 HOURS AS NEEDED FOR MUSCLE SPASMS, Disp: 120 tablet, Rfl: 5 .  Multiple Vitamin (MULTIVITAMIN) tablet, Take 1 tablet by mouth daily. One a day for over age 7, Disp: , Rfl:  .  Probiotic Product (PROBIOTIC DAILY PO), Take by mouth., Disp: , Rfl:  .  UNABLE TO FIND, CBD oil 20 mg - half a dropper at bedtime for pain or scalp psoriases, Disp: , Rfl:  .  diclofenac (VOLTAREN) 75 MG EC tablet, Take 1 tablet (75 mg total) by mouth 2 (two) times daily., Disp: 60 tablet, Rfl: 5  EXAM:  VITALS per patient if applicable:  GENERAL: alert, oriented, appears well and in no acute distress  HEENT: atraumatic, conjunttiva clear, no obvious abnormalities on inspection of external nose and ears  NECK: normal movements of the head and neck  LUNGS: on inspection no signs of respiratory distress, breathing rate appears normal, no obvious gross SOB, gasping or wheezing  CV: no obvious cyanosis  MS: moves all visible  extremities without noticeable abnormality  PSYCH/NEURO: pleasant and cooperative, no obvious depression or anxiety, speech and thought processing grossly intact  ASSESSMENT AND PLAN: Radicular neck pain, she will try Diclofenac BID along with the Gabapentin. Follow up prn.  [name], MD  Discussed the following assessment and plan:  No diagnosis found.     I discussed the assessment and treatment plan with the patient. The patient was provided an opportunity to ask questions and all were answered. The patient agreed with the plan and demonstrated an understanding of the instructions.   The patient was advised to call back or seek an in-person evaluation if the symptoms worsen or if the condition fails to improve as anticipated.

## 2019-07-06 ENCOUNTER — Other Ambulatory Visit: Payer: Self-pay | Admitting: Family Medicine

## 2019-07-07 NOTE — Telephone Encounter (Signed)
Rx not on medication list. Please advise.

## 2019-07-11 ENCOUNTER — Encounter: Payer: Self-pay | Admitting: Family Medicine

## 2019-08-08 ENCOUNTER — Other Ambulatory Visit: Payer: Self-pay

## 2019-08-08 ENCOUNTER — Encounter: Payer: Self-pay | Admitting: Adult Health

## 2019-08-08 ENCOUNTER — Inpatient Hospital Stay: Payer: 59 | Attending: Adult Health | Admitting: Adult Health

## 2019-08-08 DIAGNOSIS — Z170 Estrogen receptor positive status [ER+]: Secondary | ICD-10-CM

## 2019-08-08 DIAGNOSIS — E2839 Other primary ovarian failure: Secondary | ICD-10-CM | POA: Diagnosis not present

## 2019-08-08 DIAGNOSIS — C50411 Malignant neoplasm of upper-outer quadrant of right female breast: Secondary | ICD-10-CM

## 2019-08-08 MED ORDER — ESTRADIOL 0.1 MG/GM VA CREA: 1.0000 | TOPICAL_CREAM | VAGINAL | 12 refills | Status: DC

## 2019-08-08 NOTE — [place] Notes (Signed)
SURVIVORSHIP VIRTUAL VISIT:  I connected with Kristina Pearson on 08/08/19 at  3:00 PM EST by telephone and verified that I am speaking with the correct person using two identifiers.  I discussed the limitations, risks, security and privacy concerns of performing an evaluation and management service by telephone and the availability of in person appointments. I also discussed with the patient that there may be a patient responsible charge related to this service. The patient expressed understanding and agreed to proceed.  Patient location at home  Provider location in office No others participating in call   REASON FOR VISIT:  Routine follow-up for history of breast cancer.   BRIEF ONCOLOGIC HISTORY:  Oncology History  Breast cancer of upper-outer quadrant of right female breast ([place])  08/15/2013 Initial Diagnosis   DCIS with calcifications   08/30/2013 Surgery   Right breast lumpectomy: No evidence of residual DCIS found to have atypical lobular hyperplasia   09/26/2013 - 11/09/2013 Radiation Therapy   Adjuvant radiation therapy by Dr. Valere Dross   11/21/2013 - 08/22/2015 Anti-estrogen oral therapy   Tamoxifen 20 mg daily stopped because patient was having problems with lack of interest in life which she attributed to tamoxifen.      INTERVAL HISTORY:  Kristina Pearson [facility] for routine follow-up for her history of breast cancer.  Overall, she reports feeling quite well.   Kristina Pearson notes she is doing much better since using the estrogen cream.  She is using this very sparingly.  She is only using a very small amount two to three times per week.  She notes that her interstitial cystitis has resolved since she has been using this as well.    Patient underwent mammogram in 11/2018 and showed no evidence of malignancy and breast density B.  She denies any new issues.  She is following the appropriate pandemic precautions.  She is noting bilateral breast pain.  Her next  mammogram is due in 11/2019.   REVIEW OF SYSTEMS:  Review of Systems  Constitutional: Negative for appetite change, chills, fatigue, fever and unexpected weight change.  HENT:   Negative for hearing loss, lump/mass and sore throat.   Eyes: Negative for eye problems and icterus.  Respiratory: Negative for chest tightness, cough and shortness of breath.   Cardiovascular: Negative for chest pain, leg swelling and palpitations.  Gastrointestinal: Negative for abdominal distention, abdominal pain, constipation, diarrhea, nausea and vomiting.  Endocrine: Negative for hot flashes.  Musculoskeletal: Negative for arthralgias.  Skin: Negative for itching and rash.  Neurological: Negative for dizziness, extremity weakness, headaches and numbness.  Hematological: Negative for adenopathy. Does not bruise/bleed easily.  Psychiatric/Behavioral: Negative for depression. The patient is not nervous/anxious.   Breast: Denies any new nodularity, masses, tenderness, nipple changes, or nipple discharge.       PAST MEDICAL/SURGICAL HISTORY:  Past Medical History:  Diagnosis Date  . ADHD (attention deficit hyperactivity disorder)    ADD  . Allergy   . Arthritis    osteoarthritrs  . Breast cancer ([place]) 08/15/13   right lateral upper outer  . Fibromyalgia   . GERD (gastroesophageal reflux disease)   . Headache(784.0)   . History of depression   . Hx of radiation therapy 10/19/13- 11/09/13   right breast 4256 cGy in 16 sessions, hypo-fractionated  . Hypothyroid   . Migraine   . Osteopenia   . Personal history of radiation therapy 2015  . PONV (postoperative nausea and vomiting)   . Sialoadenitis   .  Spinal stenosis   . Urinary incontinence   . Vitiligo   . Wears glasses    Past Surgical History:  Procedure Laterality Date  . BREAST BIOPSY Right 07/2013  . BREAST LUMPECTOMY Right 08/2013  . BREAST LUMPECTOMY WITH RADIOACTIVE SEED LOCALIZATION Right 08/30/2013   Procedure: RIGHT PARTIAL  MASTECTOMY WITH RADIOACTIVE SEED LOCALIZATION;  Surgeon: [name], MD;  Location: [facility];  Service: General;  Laterality: Right;  . BREAST SURGERY    . CHOLECYSTECTOMY  1996   lap  . COLONOSCOPY  2007   per Dr. Cristina Gong, clear, repeat in 10 yrs  . DIAGNOSTIC LAPAROSCOPY  1982   exp  . LAPAROSCOPIC OVARIAN CYSTECTOMY    . URETHRAL DILATION       ALLERGIES:  Allergies  Allergen Reactions  . Seasonal Ic  [Cholestatin] Itching     CURRENT MEDICATIONS:  Outpatient Encounter Medications as of 08/08/2019  Medication Sig  . ALPRAZolam (XANAX) 0.5 MG tablet Take 1 tablet (0.5 mg total) by mouth 2 (two) times daily as needed for anxiety.  Marland Kitchen aspirin EC 81 MG tablet Take 1 tablet (81 mg total) by mouth daily.  . cetirizine (ZYRTEC) 10 MG tablet Take 1 tablet (10 mg total) by mouth 2 (two) times daily.  . cycloSPORINE (RESTASIS) 0.05 % ophthalmic emulsion Place 1 drop into both eyes 2 (two) times daily.  . diclofenac (VOLTAREN) 75 MG EC tablet Take 1 tablet (75 mg total) by mouth 2 (two) times daily.  Marland Kitchen estradiol (ESTRACE VAGINAL) 0.1 MG/GM vaginal cream Place 1 Applicatorful vaginally 3 (three) times a week.  . gabapentin (NEURONTIN) 100 MG capsule Take 1 capsule (100 mg total) by mouth 3 (three) times daily as needed (headaches). Has been taking 300 mg daily  . HYDROcodone-acetaminophen (NORCO) 10-325 MG tablet Take 1 tablet by mouth every 6 (six) hours as needed for severe pain.  Marland Kitchen levothyroxine (SYNTHROID, LEVOTHROID) 112 MCG tablet Take 112 mcg by mouth daily before breakfast.  . lisdexamfetamine (VYVANSE) 40 MG capsule Take 1 capsule (40 mg total) by mouth every morning.  . Magnesium 400 MG CAPS Take by mouth daily.  . methocarbamol (ROBAXIN) 500 MG tablet TAKE 1 TABLET BY MOUTH EVERY 6 HOURS AS NEEDED FOR MUSCLE SPASMS  . Multiple Vitamin (MULTIVITAMIN) tablet Take 1 tablet by mouth daily. One a day for over age 83  . Probiotic Product (PROBIOTIC DAILY PO) Take  by mouth.  . temazepam (RESTORIL) 30 MG capsule TAKE 1 CAPSULE (30 MG TOTAL) BY MOUTH AT BEDTIME AS NEEDED FOR SLEEP.  Marland Kitchen UNABLE TO FIND CBD oil 20 mg - half a dropper at bedtime for pain or scalp psoriases   No facility-administered encounter medications on file as of 08/08/2019.     ONCOLOGIC FAMILY HISTORY:  Family History  Problem Relation Age of Onset  . Arthritis Mother   . Diabetes Mother   . Osteoporosis Mother   . Polymyalgia rheumatica Mother        on prednisone  . Heart disease Mother   . Hyperlipidemia Mother   . Hypertension Mother   . Stroke Mother   . Heart attack Father   . Heart disease Father   . Colon cancer Maternal Uncle        dx in his 3s  . Colon cancer Cousin 69       mother's maternal first cousin  . Breast cancer Cousin 67       BRCA negative; father's maternal first cousin  .  Leukemia Maternal Uncle 81       AML  . Melanoma Maternal Uncle 73  . Leukemia Maternal Uncle 17  . Ovarian cancer Cousin 77       maternal cousin  . Ovarian cancer Cousin 24       maternal cousin  . Leukemia Cousin        dx in his 61s; maternal cousin; thought to be the result of taking [place]  . Heart attack Paternal Uncle   . Colon cancer Cousin 1       maternal second cousin  . Breast cancer Other        maternal great aunt dx in her 37s  . Ovarian cancer Other        maternal grandfather's sister dx in her 37s  . Colon cancer Other        maternal grandfather's sister dx in her 23s  . Colon cancer Other        maternal grandfather's sister dx in her 3s  . Other Daughter        dermoid tumor  . Diabetes Paternal Grandmother   . Skin cancer Paternal Grandmother   . Hypertension Paternal Grandmother     GENETIC COUNSELING/TESTING:   SOCIAL HISTORY:  Social History   Socioeconomic History  . Marital status: Married    Spouse name: Not on file  . Number of children: 1  . Years of education: Not on file  . Highest education level: Not on file   Occupational History  . Not on file  Tobacco Use  . Smoking status: Former Smoker    Quit date: 09/14/1978    Years since quitting: 40.9  . Smokeless tobacco: Never Used  . Tobacco comment: short time in college  Substance and Sexual Activity  . Alcohol use: Yes    Alcohol/week: 1.0 - 2.0 standard drinks    Types: 1 - 2 Standard drinks or equivalent per week  . Drug use: No  . Sexual activity: Yes    Partners: Male    Birth control/protection: Post-menopausal    Comment: menarche age 48, first live birth age 34, menopause age 63, no HRT  Other Topics Concern  . Not on file  Social History Narrative   Lives with husband, daughter and mother in a 2 story home.  Has one child.  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  Education: college degree   Social Determinants of Health   Financial Resource Strain:   . Difficulty of Paying Living Expenses: Not on file  Food Insecurity:   . Worried About Charity fundraiser in the Last Year: Not on file  . Ran Out of Food in the Last Year: Not on file  Transportation Needs:   . Lack of Transportation (Medical): Not on file  . Lack of Transportation (Non-Medical): Not on file  Physical Activity:   . Days of Exercise per Week: Not on file  . Minutes of Exercise per Session: Not on file  Stress:   . Feeling of Stress : Not on file  Social Connections:   . Frequency of Communication with Friends and Family: Not on file  . Frequency of Social Gatherings with Friends and Family: Not on file  . Attends Religious Services: Not on file  . Active Member of Clubs or Organizations: Not on file  . Attends Archivist Meetings: Not on file  . Marital Status: Not on file  Intimate Partner Violence:   .  Fear of Current or Ex-Partner: Not on file  . Emotionally Abused: Not on file  . Physically Abused: Not on file  . Sexually Abused: Not on file      OBJECTIVE:  Patient sounds well.  She is in no apparent distress.  Mood and  behavior are normal, breathing non labored, speech is normal  LABORATORY DATA:  None for this visit   DIAGNOSTIC IMAGING:  Most recent mammogram:  CLINICAL DATA:  History of right breast cancer status post lumpectomy in 2015. Patient complains of right axillary pain.  EXAM: DIGITAL DIAGNOSTIC BILATERAL MAMMOGRAM WITH CAD AND TOMO  ULTRASOUND RIGHT BREAST  COMPARISON:  Previous exam(s).  ACR Breast Density Category b: There are scattered areas of fibroglandular density.  FINDINGS: Stable lumpectomy changes are seen in the right breast. No suspicious mass or malignant type microcalcifications identified in either breast.  Mammographic images were processed with CAD.  On physical exam, I do not palpate a mass in the right axilla.  Targeted ultrasound is performed, showing normal tissue in the right axilla. No enlarged adenopathy, mass or shadowing detected.  IMPRESSION: No evidence of malignancy in either breast.  RECOMMENDATION: Bilateral screening mammogram in 1 year is recommended.  I have discussed the findings and recommendations with the patient. Results were also provided in writing at the conclusion of the visit. If applicable, a reminder letter will be sent to the patient regarding the next appointment.  BI-RADS CATEGORY  2: Benign.   Electronically Signed   By: Lillia Mountain M.D.   On: 12/07/2018 15:06   ASSESSMENT AND PLAN:  Ms.. Gagner is a pleasant 64 y.o. female with history of Stage 0 right breast DCIS, ER+/PR+, diagnosed in 07/2013, treated with lumpectomy, adjuvant radiation therapy, and anti-estrogen therapy with Tamoxifen x 2 years stopped early in 2017 due to side effects.  She [facility] for surveillance and routine follow-up.   1. History of breast cancer:  Ms. Loretto is currently clinically and radiographically without evidence of disease or recurrence of breast cancer. She will be due for mammogram in 11/2019;  orders placed today.  She has breast pain and will have ultrasound of her breast at her time.  She will return in 11/2019 for f/u.  I encouraged her to call me with any questions or concerns before her next visit at the cancer center, and I would be happy to see her sooner, if needed.    2. Bone health:  Given Ms. Litchford's age, history of breast cancer, she is at risk for bone demineralization. Her last DEXA scan was on 09/06/2017 and her T score was -1.7 in the left femur consistent with osteopenia.  In the meantime, she was encouraged to increase her consumption of foods rich in calcium, as well as increase her weight-bearing activities.  She was given education on specific food and activities to promote bone health.  3. Cancer screening:  Due to Ms. Huntsberry's history and her age, she should receive screening for skin cancers, colon cancer, and gynecologic cancers. She was encouraged to follow-up with her PCP for appropriate cancer screenings.   4. Health maintenance and wellness promotion: Ms. Wadsworth was encouraged to consume 5-7 servings of fruits and vegetables per day. She was also encouraged to engage in moderate to vigorous exercise for 30 minutes per day most days of the week. She was instructed to limit her alcohol consumption and continue to abstain from tobacco use.    Follow up instructions:    -  Return to cancer center 11/2019 after mammogram -Mammogram due in 11/2019 -Bone density 11/2019    The patient was provided an opportunity to ask questions and all were answered. The patient agreed with the plan and demonstrated an understanding of the instructions.   The patient was advised to call back or seek an in-person evaluation if the symptoms worsen or if the condition fails to improve as anticipated.   I provided 22 minutes of non face-to-face telephone visit time during this encounter, and > 50% was spent counseling as documented under my assessment & plan.   Gardenia Phlegm, NP  [facility] [place] [phone]   Note: PRIMARY CARE PROVIDER Laurey Morale, [place] [phone]

## 2019-08-09 ENCOUNTER — Telehealth: Payer: Self-pay | Admitting: Adult Health

## 2019-08-09 NOTE — Telephone Encounter (Signed)
I talk with patient regarding LTS

## 2019-08-14 ENCOUNTER — Telehealth: Payer: Self-pay | Admitting: Adult Health

## 2019-08-14 NOTE — Telephone Encounter (Signed)
Scheduled appt per 2/16 sch message - pt is aware of appt date and time   

## 2019-08-17 ENCOUNTER — Telehealth: Payer: Self-pay | Admitting: *Deleted

## 2019-08-17 NOTE — Telephone Encounter (Signed)
Patient called office to request electronic scripts of Vvaynse sent to [facility] on [address].

## 2019-08-18 MED ORDER — LISDEXAMFETAMINE DIMESYLATE 40 MG PO CAPS: 40.0000 mg | ORAL_CAPSULE | ORAL | 0 refills | Status: DC

## 2019-08-18 NOTE — Telephone Encounter (Signed)
Done

## 2019-08-18 NOTE — Telephone Encounter (Signed)
Patient is aware 

## 2019-08-18 NOTE — Addendum Note (Signed)
Addended by: [name] A on: 08/18/2019 08:00 AM   Modules accepted: Orders

## 2019-08-21 ENCOUNTER — Other Ambulatory Visit: Payer: Self-pay | Admitting: Genetic Counselor

## 2019-08-21 ENCOUNTER — Encounter: Payer: Self-pay | Admitting: Genetic Counselor

## 2019-08-21 ENCOUNTER — Ambulatory Visit (HOSPITAL_BASED_OUTPATIENT_CLINIC_OR_DEPARTMENT_OTHER): Payer: 59 | Admitting: Genetic Counselor

## 2019-08-21 DIAGNOSIS — Z808 Family history of malignant neoplasm of other organs or systems: Secondary | ICD-10-CM | POA: Diagnosis not present

## 2019-08-21 DIAGNOSIS — Z800 Family history of malignant neoplasm of digestive organs: Secondary | ICD-10-CM

## 2019-08-21 DIAGNOSIS — Z803 Family history of malignant neoplasm of breast: Secondary | ICD-10-CM

## 2019-08-21 DIAGNOSIS — Z170 Estrogen receptor positive status [ER+]: Secondary | ICD-10-CM

## 2019-08-21 DIAGNOSIS — C50411 Malignant neoplasm of upper-outer quadrant of right female breast: Secondary | ICD-10-CM

## 2019-08-21 DIAGNOSIS — Z8041 Family history of malignant neoplasm of ovary: Secondary | ICD-10-CM | POA: Diagnosis not present

## 2019-08-21 NOTE — Progress Notes (Signed)
REFERRING PROVIDER: Laurey Morale, MD [place],  [place] [address]  PRIMARY PROVIDER:  Laurey Morale, MD  PRIMARY REASON FOR VISIT:  1. Malignant neoplasm of upper-outer quadrant of right breast in female, estrogen receptor positive ([place])   2. Family history of colon cancer   3. Family history of melanoma   4. Family history of ovarian cancer   5. Family history of breast cancer      HISTORY OF PRESENT ILLNESS:   Ms. Scantlebury, a 64 y.o. female, was seen for a [facility] cancer genetics consultation at the request of Dr. Sarajane Jews due to a personal and family history of cancer.  Ms. Eckford presents to clinic today to discuss the possibility of a hereditary predisposition to cancer, genetic testing, and to further clarify her future cancer risks, as well as potential cancer risks for family members.   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 The treatment plan included lumpectomy, radiation and tamoxifen. 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  Due to additional genes and additional family history, Ms. Schreier was re-referred to determine if additional genetic testing is warranted.     CANCER HISTORY:  Oncology History  Breast cancer of upper-outer quadrant of right female breast ([place])  08/15/2013 Initial Diagnosis   DCIS with calcifications   08/30/2013 Surgery   Right breast lumpectomy: No evidence of residual DCIS found to have atypical lobular hyperplasia   09/26/2013 - 11/09/2013 Radiation Therapy   Adjuvant radiation therapy by Dr. Valere Dross   11/21/2013 - 08/22/2015 Anti-estrogen oral therapy   Tamoxifen 20 mg daily stopped because patient was having problems with lack of interest in life which she attributed to tamoxifen.      RISK FACTORS:  Menarche was at age 50.  First live birth at age 6.  OCP use for approximately 0 years.  Ovaries intact: yes.  Hysterectomy: yes.  Menopausal status:  postmenopausal.  HRT use: 0 years. Colonoscopy: yes; normal. Mammogram within the last year: yes. Number of breast biopsies: 1. Up to date with pelvic exams: yes. Any excessive radiation exposure in the past: yes  Past Medical History:  Diagnosis Date  . ADHD (attention deficit hyperactivity disorder)    ADD  . Allergy   . Arthritis    osteoarthritrs  . Breast cancer ([place]) 08/15/13   right lateral upper outer  . Family history of breast cancer   . Family history of colon cancer   . Family history of melanoma   . Family history of ovarian cancer   . Fibromyalgia   . GERD (gastroesophageal reflux disease)   . Headache(784.0)   . History of depression   . Hx of radiation therapy 10/19/13- 11/09/13   right breast 4256 cGy in 16 sessions, hypo-fractionated  . Hypothyroid   . Migraine   . Osteopenia   . Personal history of radiation therapy 2015  . PONV (postoperative nausea and vomiting)   . Sialoadenitis   . Spinal stenosis   . Urinary incontinence   . Vitiligo   . Wears glasses     Past Surgical History:  Procedure Laterality Date  . BREAST BIOPSY Right 07/2013  . BREAST LUMPECTOMY Right 08/2013  . BREAST LUMPECTOMY WITH RADIOACTIVE SEED LOCALIZATION Right 08/30/2013   Procedure: RIGHT PARTIAL MASTECTOMY WITH RADIOACTIVE SEED LOCALIZATION;  Surgeon: [name], MD;  Location: [facility];  Service:  General;  Laterality: Right;  . BREAST SURGERY    . CHOLECYSTECTOMY  1996   lap  . COLONOSCOPY  2007   per Dr. Cristina Gong, clear, repeat in 10 yrs  . DIAGNOSTIC LAPAROSCOPY  1982   exp  . LAPAROSCOPIC OVARIAN CYSTECTOMY    . URETHRAL DILATION      Social History   Socioeconomic History  . Marital status: Married    Spouse name: Not on file  . Number of children: 1  . Years of education: Not on file  . Highest education level: Not on file  Occupational History  . Not on file  Tobacco Use  . Smoking status: Former Smoker    Quit date: 09/14/1978     Years since quitting: 40.9  . Smokeless tobacco: Never Used  . Tobacco comment: short time in college  Substance and Sexual Activity  . Alcohol use: Yes    Alcohol/week: 1.0 - 2.0 standard drinks    Types: 1 - 2 Standard drinks or equivalent per week  . Drug use: No  . Sexual activity: Yes    Partners: Male    Birth control/protection: Post-menopausal    Comment: menarche age 70, first live birth age 41, menopause age 62, no HRT  Other Topics Concern  . Not on file  Social History Narrative   Lives with husband, daughter and mother in a 2 story home.  Has one child.  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  Education: college degree   Social Determinants of Health   Financial Resource Strain:   . Difficulty of Paying Living Expenses: Not on file  Food Insecurity:   . Worried About Charity fundraiser in the Last Year: Not on file  . Ran Out of Food in the Last Year: Not on file  Transportation Needs:   . Lack of Transportation (Medical): Not on file  . Lack of Transportation (Non-Medical): Not on file  Physical Activity:   . Days of Exercise per Week: Not on file  . Minutes of Exercise per Session: Not on file  Stress:   . Feeling of Stress : Not on file  Social Connections:   . Frequency of Communication with Friends and Family: Not on file  . Frequency of Social Gatherings with Friends and Family: Not on file  . Attends Religious Services: Not on file  . Active Member of Clubs or Organizations: Not on file  . Attends Archivist Meetings: Not on file  . Marital Status: Not on file     FAMILY HISTORY:  We obtained a detailed, 4-generation family history.  Significant diagnoses are listed below: Family History  Problem Relation Age of Onset  . Arthritis Mother   . Diabetes Mother   . Osteoporosis Mother   . Polymyalgia rheumatica Mother        on prednisone  . Heart disease Mother   . Hyperlipidemia Mother   . Hypertension Mother   . Stroke  Mother   . Heart attack Father   . Heart disease Father   . Melanoma Father   . Colon cancer Maternal Uncle        dx in his 26s  . Colon cancer Cousin 15       mother's maternal first cousin  . Breast cancer Cousin 43       BRCA negative; father's maternal first cousin  . Leukemia Maternal Uncle 81       AML  . Melanoma Maternal  Uncle 73  . Leukemia Maternal Uncle 36  . Ovarian cancer Cousin 15       maternal cousin  . Ovarian cancer Cousin 24       maternal cousin  . Leukemia Cousin        dx in his 26s; maternal cousin; thought to be the result of taking [place]  . Heart attack Paternal Uncle   . Melanoma Paternal Uncle   . Colon cancer Cousin 23       maternal second cousin  . Breast cancer Other        maternal great aunt dx in her 17s  . Ovarian cancer Other        maternal grandfather's sister dx in her 77s  . Colon cancer Other        maternal grandfather's sister dx in her 11s  . Colon cancer Other        maternal grandfather's sister dx in her 19s  . Other Daughter        dermoid tumor  . Diabetes Paternal Grandmother   . Skin cancer Paternal Grandmother   . Hypertension Paternal Grandmother   . Melanoma Paternal Grandmother   . Congestive Heart Failure Maternal Grandmother   . Diabetes Maternal Grandmother   . Other Maternal Aunt        COVID    The patient has one daughter who is cancer free, but has had dermoid cysts on her ovaries.  She has a brother and sister who are both cancer free.  Both parents are deceased.  The patient's mother had multiple myeloma.  She had two sisters and seven brothers.  One brother had colon cancer, one had AML and one had melanoma and CLL.  This brother had two daughters who had ovarian cancer in their 20's.  The maternal grandparents are deceased.  The grandfather had two sisters with colon cancer and a sister with ovarian cancer.  The grandmother had a sister with breast cancer and she had a son with colon cancer and a  grandchild with colon cancer at 59.  The patient's father had melanoma, his brother died of a heart attack but had melanoma before his death, and the paternal grandmother also had melanoma.  Ms. Nanez is unaware of previous family history of genetic testing for hereditary cancer risks. Patient's maternal ancestors are of [place] and [place] descent, and paternal ancestors are of [place] descent. There is no reported Ashkenazi Jewish ancestry. There is no known consanguinity.  GENETIC COUNSELING ASSESSMENT: Ms. Pennebaker is a 64 y.o. female with a personal and family history of cancer which is somewhat suggestive of a hereditary cancer syndrome and predisposition to cancer given the number and combination of cases of cancer within the family as well as some early onset cancers. We, therefore, discussed and recommended the following at today's visit.   DISCUSSION: We discussed that 5 - 10% of breast cancer is hereditary, with most cases associated with BRCA mutations.  There are other genes that can be associated with hereditary breast cancer syndromes. Ms. Baer underwent genetic testing in 2015 through [facility].  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  This panel, however, did not look at melanoma genes, and did not include DICER1, that can sometimes be associated with really young germ cell tumors.  We discussed that the germ cell tumor her daughter has, a dermoid cell, are typically benign in nature and  not familial.  In rare cases they can be.  DICER1 is associated with a germ cell tumor called Sertoli-Leydig.  Therefore it is unlikely that the dermoid cell tumors are related to [place] but it has not been eliminated.  In order to ensure that we have not missed any hereditary cancer syndromes, we recommended testing to include DICER1 and additional melanoma genes.  We discussed that testing is beneficial for several reasons  including knowing how to follow individuals after completing their treatment, identifying whether potential treatment options such as PARP inhibitors would be beneficial, and understand if other family members could be at risk for cancer and allow them to undergo genetic testing.   We reviewed the characteristics, features and inheritance patterns of hereditary cancer syndromes. We also discussed genetic testing, including the appropriate family members to test, the process of testing, insurance coverage and turn-around-time for results. We discussed the implications of a negative, positive, carrier and/or variant of uncertain significant result. We recommended Ms. Mallo pursue genetic testing for the Multi-cancer gene panel. The Multi-Gene Panel offered by [facility] includes sequencing and/or deletion duplication testing of the following 85 genes: AIP, ALK, APC, ATM, AXIN2,BAP1,  BARD1, BLM, BMPR1A, BRCA1, BRCA2, BRIP1, CASR, CDC73, CDH1, CDK4, CDKN1B, CDKN1C, CDKN2A (p14ARF), CDKN2A (p16INK4a), CEBPA, CHEK2, CTNNA1, DICER1, DIS3L2, EGFR (c.2369C>T, p.Thr790Met variant only), EPCAM (Deletion/duplication testing only), FH, FLCN, GATA2, GPC3, GREM1 (Promoter region deletion/duplication testing only), HOXB13 (c.251G>A, p.Gly84Glu), HRAS, KIT, MAX, MEN1, MET, MITF (c.952G>A, p.Glu318Lys variant only), MLH1, MSH2, MSH3, MSH6, MUTYH, NBN, NF1, NF2, NTHL1, PALB2, PDGFRA, PHOX2B, PMS2, POLD1, POLE, POT1, PRKAR1A, PTCH1, PTEN, RAD50, RAD51C, RAD51D, RB1, RECQL4, RET, RNF43, RUNX1, SDHAF2, SDHA (sequence changes only), SDHB, SDHC, SDHD, SMAD4, SMARCA4, SMARCB1, SMARCE1, STK11, SUFU, TERC, TERT, TMEM127, TP53, TSC1, TSC2, VHL, WRN and WT1.    Based on Ms. Rosevear's personal and family history of cancer, she meets medical criteria for genetic testing. Despite that she meets criteria, she may still have an out of pocket cost. We discussed that if her out of pocket cost for testing is over $100, the laboratory will call and confirm  whether she wants to proceed with testing.  If the out of pocket cost of testing is less than $100 she will be billed by the genetic testing laboratory.   PLAN: After considering the risks, benefits, and limitations, Ms. Serpe provided informed consent to pursue genetic testing.  She is scheduled to come in for a blood draw on August 24, 2019 and the blood sample was sent to [facility] for analysis of the multi-cancer panel plus preliminary evidence genes. Results should be available within approximately 2-3 weeks' time, at which point they will be disclosed by telephone to Ms. Caya, as will any additional recommendations warranted by these results. Ms. Gerken will receive a summary of her genetic counseling visit and a copy of her results once available. This information will also be available in Epic.   Lastly, we encouraged Ms. Curnutt to remain in contact with cancer genetics annually so that we can continuously update the family history and inform her of any changes in cancer genetics and testing that may be of benefit for this family.   Ms. Pegues's questions were answered to her satisfaction today. Our contact information was provided should additional questions or concerns arise. Thank you for the referral and allowing [place] to share in the care of your patient.   Paiden Cavell P. Florene Glen, [place], [facility] Licensed, Insurance risk surveyor [email or web address] phone: [phone]  The patient was seen  for a total of 49 minutes in face-to-face genetic counseling.  This patient was discussed with Drs. Magrinat, Lindi Adie and/or Burr Medico who agrees with the above.    _______________________________________________________________________ For Office Staff:  Number of people involved in session: 1 Was an Intern/ student involved with case: no

## 2019-08-23 ENCOUNTER — Telehealth: Payer: Self-pay | Admitting: Adult Health

## 2019-08-23 NOTE — Telephone Encounter (Signed)
Rescheduled per 3/3 sch msg, pt req. Called and spoke with pt, confirmed 3/30 appt

## 2019-08-24 ENCOUNTER — Other Ambulatory Visit: Payer: 59

## 2019-08-30 ENCOUNTER — Encounter: Payer: Self-pay | Admitting: Family Medicine

## 2019-08-30 NOTE — Progress Notes (Unsigned)
PA started for Vyvanse 40 mg capsules. Pending approval. (Key: [id number]) - [id number]

## 2019-09-08 ENCOUNTER — Other Ambulatory Visit: Payer: Self-pay

## 2019-09-08 ENCOUNTER — Encounter (HOSPITAL_BASED_OUTPATIENT_CLINIC_OR_DEPARTMENT_OTHER): Payer: Self-pay | Admitting: Otolaryngology

## 2019-09-09 ENCOUNTER — Other Ambulatory Visit (HOSPITAL_COMMUNITY)
Admission: RE | Admit: 2019-09-09 | Discharge: 2019-09-09 | Disposition: A | Discharge status: Home / Self Care | Payer: 59 | Source: Ambulatory Visit | Attending: Otolaryngology | Admitting: Otolaryngology

## 2019-09-09 DIAGNOSIS — Z01812 Encounter for preprocedural laboratory examination: Secondary | ICD-10-CM | POA: Diagnosis present

## 2019-09-09 DIAGNOSIS — Z20822 Contact with and (suspected) exposure to covid-19: Secondary | ICD-10-CM | POA: Diagnosis not present

## 2019-09-09 LAB — SARS CORONAVIRUS 2 (TAT 6-24 HRS): SARS Coronavirus 2: NEGATIVE

## 2019-09-13 ENCOUNTER — Ambulatory Visit (HOSPITAL_BASED_OUTPATIENT_CLINIC_OR_DEPARTMENT_OTHER): Admission: RE | Admit: 2019-09-13 | Payer: 59 | Source: Home / Self Care | Admitting: Otolaryngology

## 2019-09-13 HISTORY — DX: Anxiety disorder, unspecified: F41.9

## 2019-09-13 HISTORY — DX: Attention-deficit hyperactivity disorder, unspecified type: F90.9

## 2019-09-13 SURGERY — EXCISION, SUBMANDIBULAR GLAND
Anesthesia: General | Laterality: Right

## 2019-09-14 ENCOUNTER — Other Ambulatory Visit: Payer: Self-pay | Admitting: Family Medicine

## 2019-09-16 NOTE — Telephone Encounter (Signed)
Last ov:05/16/2019 Last filled:01/09/19

## 2019-09-18 ENCOUNTER — Other Ambulatory Visit: Payer: Self-pay

## 2019-09-18 ENCOUNTER — Other Ambulatory Visit: Payer: Self-pay | Admitting: Obstetrics & Gynecology

## 2019-09-18 ENCOUNTER — Inpatient Hospital Stay: Payer: 59 | Attending: Genetic Counselor

## 2019-09-18 DIAGNOSIS — C50411 Malignant neoplasm of upper-outer quadrant of right female breast: Secondary | ICD-10-CM

## 2019-09-18 LAB — GENETIC SCREENING ORDER

## 2019-09-19 ENCOUNTER — Ambulatory Visit (INDEPENDENT_AMBULATORY_CARE_PROVIDER_SITE_OTHER): Payer: 59 | Admitting: Family Medicine

## 2019-09-19 ENCOUNTER — Other Ambulatory Visit: Payer: 59

## 2019-09-19 ENCOUNTER — Inpatient Hospital Stay: Payer: 59

## 2019-09-19 ENCOUNTER — Encounter: Payer: Self-pay | Admitting: Family Medicine

## 2019-09-19 VITALS — BP 140/80 | HR 88 | Temp 98.2°F | Ht 66.0 in | Wt 166.2 lb

## 2019-09-19 DIAGNOSIS — Z0000 Encounter for general adult medical examination without abnormal findings: Secondary | ICD-10-CM | POA: Diagnosis not present

## 2019-09-19 DIAGNOSIS — J3089 Other allergic rhinitis: Secondary | ICD-10-CM | POA: Diagnosis not present

## 2019-09-19 LAB — COMPREHENSIVE METABOLIC PANEL
ALT: 16 IU/L (ref 0–32)
AST: 22 IU/L (ref 0–40)
Albumin/Globulin Ratio: 2 (ref 1.2–2.2)
Albumin: 4.3 g/dL (ref 3.8–4.8)
Alkaline Phosphatase: 62 IU/L (ref 39–117)
BUN/Creatinine Ratio: 8 — ABNORMAL LOW (ref 12–28)
BUN: 8 mg/dL (ref 8–27)
Bilirubin Total: 0.4 mg/dL (ref 0.0–1.2)
CO2: 26 mmol/L (ref 20–29)
Calcium: 9.6 mg/dL (ref 8.7–10.3)
Chloride: 100 mmol/L (ref 96–106)
Creatinine, Ser: 0.95 mg/dL (ref 0.57–1.00)
GFR calc Af Amer: 74 mL/min/{1.73_m2} (ref >59)
GFR calc non Af Amer: 64 mL/min/{1.73_m2} (ref >59)
Globulin, Total: 2.2 g/dL (ref 1.5–4.5)
Glucose: 101 mg/dL — ABNORMAL HIGH (ref 65–99)
Potassium: 4.5 mmol/L (ref 3.5–5.2)
Sodium: 137 mmol/L (ref 134–144)
Total Protein: 6.5 g/dL (ref 6.0–8.5)

## 2019-09-19 LAB — CBC
Hematocrit: 41.5 % (ref 34.0–46.6)
Hemoglobin: 13.6 g/dL (ref 11.1–15.9)
MCH: 32.4 pg (ref 26.6–33.0)
MCHC: 32.8 g/dL (ref 31.5–35.7)
MCV: 99 fL — ABNORMAL HIGH (ref 79–97)
Platelets: 260 10*3/uL (ref 150–450)
RBC: 4.2 x10E6/uL (ref 3.77–5.28)
RDW: 11.8 % (ref 11.7–15.4)
WBC: 4.2 10*3/uL (ref 3.4–10.8)

## 2019-09-19 LAB — LIPID PANEL W/O CHOL/HDL RATIO
Cholesterol, Total: 192 mg/dL (ref 100–199)
HDL: 87 mg/dL (ref >39)
LDL Chol Calc (NIH): 94 mg/dL (ref 0–99)
Triglycerides: 57 mg/dL (ref 0–149)
VLDL Cholesterol Cal: 11 mg/dL (ref 5–40)

## 2019-09-19 LAB — VITAMIN D 25 HYDROXY (VIT D DEFICIENCY, FRACTURES): Vit D, 25-Hydroxy: 46.8 ng/mL (ref 30.0–100.0)

## 2019-09-19 LAB — TSH: TSH: 0.673 u[IU]/mL (ref 0.450–4.500)

## 2019-09-19 LAB — HGB A1C W/O EAG: Hgb A1c MFr Bld: 5 % (ref 4.8–5.6)

## 2019-09-19 NOTE — Progress Notes (Signed)
Subjective:    Patient ID: Kristina Pearson, female    DOB: September 03, 1955, 64 y.o.   MRN: [id number]  HPI Here for a well exam. She feels well in general, although her allergies have been acting up and she still has frequent headaches. It is not clear if her headache are migraines or cluster headaches. She sees Dr. Gwendel Pearson at [facility] for this. She has been taking Claritin for her hay fever symptoms, but this spring it is not working as well as it used to. She asks to see Dr. Tiajuana Pearson again at [facility]. She sees Oncology and gets mammograms regularly to follow up her hx of breat cancer. She is past due for a colonoscopy with Dr. Cristina Pearson, but she has been out of town a lot and has not scheduled this yet. She has been seeing Dr. Izora Pearson for a submandibular stone, but he says it is too small for a traditional surgery. He thinks an endoscopic procedure would be more successful, so he as referred her to Dr. Feliz Pearson at [facility]. Kristina Pearson saw Dr. Dellis Pearson yesterday for a GYN exam and they draw complete fasting labs.    Review of Systems  Constitutional: Negative.   HENT: Negative.   Eyes: Negative.   Respiratory: Negative.   Cardiovascular: Negative.   Gastrointestinal: Negative.   Genitourinary: Negative for decreased urine volume, difficulty urinating, dyspareunia, dysuria, enuresis, flank pain, frequency, hematuria, pelvic pain and urgency.  Musculoskeletal: Negative.   Skin: Negative.   Neurological: Positive for headaches.  Psychiatric/Behavioral: Negative.        Objective:   Physical Exam Constitutional:      General: She is not in acute distress.    Appearance: She is well-developed.  HENT:     Head: Normocephalic and atraumatic.     Right Ear: External ear normal.     Left Ear: External ear normal.     Nose: Nose normal.     Mouth/Throat:     Pharynx: No oropharyngeal exudate.  Eyes:     General: No scleral icterus.    Conjunctiva/sclera:  Conjunctivae normal.     Pupils: Pupils are equal, round, and reactive to light.  Neck:     Thyroid: No thyromegaly.     Vascular: No JVD.  Cardiovascular:     Rate and Rhythm: Normal rate and regular rhythm.     Heart sounds: Normal heart sounds. No murmur. No friction rub. No gallop.   Pulmonary:     Effort: Pulmonary effort is normal. No respiratory distress.     Breath sounds: Normal breath sounds. No wheezing or rales.  Chest:     Chest wall: No tenderness.  Abdominal:     General: Bowel sounds are normal. There is no distension.     Palpations: Abdomen is soft. There is no mass.     Tenderness: There is no abdominal tenderness. There is no guarding or rebound.  Musculoskeletal:        General: No tenderness. Normal range of motion.     Cervical back: Normal range of motion and neck supple.  Lymphadenopathy:     Cervical: No cervical adenopathy.  Skin:    General: Skin is warm and dry.     Findings: No erythema or rash.  Neurological:     Mental Status: She is alert and oriented to person, place, and time.     Cranial Nerves: No cranial nerve deficit.     Motor: No  abnormal muscle tone.     Coordination: Coordination normal.     Deep Tendon Reflexes: Reflexes are normal and symmetric. Reflexes normal.  Psychiatric:        Behavior: Behavior normal.        Thought Content: Thought content normal.        Judgment: Judgment normal.           Assessment & Plan:  Well exam. We discussed diet and exercise. I await a faxed copy of the labs she had drawn yetserday. She will follow up with Dr. Haskel Pearson and Dr. Rock Pearson. She will set up another colonoscopy soon with Dr. Cristina Pearson. We will refer her back to Dr. Orvil Pearson for her allergies, but in the meantime I suggested she try Xyzal OTC.  Kristina Penna, MD

## 2019-10-02 ENCOUNTER — Telehealth: Payer: Self-pay | Admitting: Genetic Counselor

## 2019-10-02 ENCOUNTER — Encounter: Payer: Self-pay | Admitting: Genetic Counselor

## 2019-10-02 ENCOUNTER — Ambulatory Visit: Payer: Self-pay | Admitting: Genetic Counselor

## 2019-10-02 DIAGNOSIS — Z1379 Encounter for other screening for genetic and chromosomal anomalies: Secondary | ICD-10-CM

## 2019-10-02 NOTE — Telephone Encounter (Signed)
Revealed negative genetic testing.  Discussed that we do not know why she has breast cancer or why there is cancer in the family. It could be due to a different gene that we are not testing, or maybe our current technology may not be able to pick something up.  It will be important for her to keep in contact with genetics to keep up with whether additional testing may be needed.  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  There is no medical management changes based on this result.

## 2019-10-02 NOTE — Progress Notes (Signed)
HPI:  Ms. Mcglothlin was previously seen in the [facility] due to a personal and family history of cancer and concerns regarding a hereditary predisposition to cancer. Please refer to our prior cancer genetics clinic note for more information regarding our discussion, assessment and recommendations, at the time. Ms. Hinkle's recent genetic test results were disclosed to her, as were recommendations warranted by these results. These results and recommendations are discussed in more detail below.  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 Valere Dross   11/21/2013 - 08/22/2015 Anti-estrogen oral therapy   Tamoxifen 20 mg daily stopped because patient was having problems with lack of interest in life which she attributed to tamoxifen.   10/01/2019 Genetic Testing   PTEN c.1115A>G (p.Asn372Ser) VUS, but otherwise Negative genetic testing on the multicancer gene and preliminary evidence colorectal cancer gene panel.  The Multi-Gene Panel offered by [facility] includes sequencing and/or deletion duplication testing of the following 91 genes: AIP, ALK, APC, ATM, AXIN2,BAP1,  BARD1, BLM, BMPR1A, BRCA1, BRCA2, BRIP1, BUB1B, CASR, CDC73, CDH1, CDK4, CDKN1B, CDKN1C, CDKN2A (p14ARF), CDKN2A (p16INK4a), CEBPA, CFP57, CHEK2, CTNNA1, DICER1, DIS3L2, EGFR (c.2369C>T, p.Thr790Met variant only), EPCAM (Deletion/duplication testing only), ENG, FH, FLCN, GALANT12, GATA2, GPC3, GREM1 (Promoter region deletion/duplication testing only), HOXB13 (c.251G>A, p.Gly84Glu), HRAS, KIT, MAX, MEN1, MET, MITF (c.952G>A, p.Glu318Lys variant only), MLH1, MSH2, MSH3, MSH6, MUTYH, NBN, NF1, NF2, NTHL1, PALB2, PDGFRA, PHOX2B,  PMS2, POLD1, POLE, POT1, PRKAR1A, PTCH1, PTEN, RAD50, RAD51C, RAD51D, RB1, RECQL4, RET, RNF43, RSP20, RUNX1, SDHAF2, SDHA (sequence changes only), SDHB, SDHC, SDHD, SMAD4, SMARCA4, SMARCB1, SMARCE1, STK11, SUFU, TERC, TERT, TMEM127, TP53, TSC1, TSC2, VHL, WRN and WT1. The report date is 10/01/2019.     FAMILY HISTORY:  We obtained a detailed, 4-generation family history.  Significant diagnoses are listed below: Family History  Problem Relation Age of Onset  . Arthritis Mother   . Diabetes Mother   . Osteoporosis Mother   . Polymyalgia rheumatica Mother        on prednisone  . Heart disease Mother   . Hyperlipidemia Mother   . Hypertension Mother   . Stroke Mother   . Heart attack Father   . Heart disease Father   . Melanoma Father   . Colon cancer Maternal Uncle        dx in his 49s  . Colon cancer Cousin 24       mother's maternal first cousin  . Breast cancer Cousin 48       BRCA negative; father's maternal first cousin  . Leukemia Maternal Uncle 81       AML  . Melanoma Maternal Uncle 73  . Leukemia Maternal Uncle 30  . Ovarian cancer Cousin 45       maternal cousin  . Ovarian cancer Cousin 24       maternal cousin  . Leukemia Cousin        dx in his 48s; maternal cousin; thought to be the result of taking [place]  . Heart attack Paternal Uncle   . Melanoma Paternal Uncle   . Colon cancer Cousin 20       maternal second cousin  . Breast cancer Other  maternal great aunt dx in her 80s  . Ovarian cancer Other        maternal grandfather's sister dx in her 26s  . Colon cancer Other        maternal grandfather's sister dx in her 27s  . Colon cancer Other        maternal grandfather's sister dx in her 66s  . Other Daughter        dermoid tumor  . Diabetes Paternal Grandmother   . Skin cancer Paternal Grandmother   . Hypertension Paternal Grandmother   . Melanoma Paternal Grandmother   . Congestive Heart Failure Maternal Grandmother   . Diabetes Maternal  Grandmother   . Other Maternal Aunt        COVID    The patient has one daughter who is cancer free, but has had dermoid cysts on her ovaries.  She has a brother and sister who are both cancer free.  Both parents are deceased.  The patient's mother had multiple myeloma.  She had two sisters and seven brothers.  One brother had colon cancer, one had AML and one had melanoma and CLL.  This brother had two daughters who had ovarian cancer in their 20's.  The maternal grandparents are deceased.  The grandfather had two sisters with colon cancer and a sister with ovarian cancer.  The grandmother had a sister with breast cancer and she had a son with colon cancer and a grandchild with colon cancer at 43.  The patient's father had melanoma, his brother died of a heart attack but had melanoma before his death, and the paternal grandmother also had melanoma.  Ms. Voshell is unaware of previous family history of genetic testing for hereditary cancer risks. Patient's maternal ancestors are of [place] and [place] descent, and paternal ancestors are of [place] descent. There is no reported Ashkenazi Jewish ancestry. There is no known consanguinity.   GENETIC TEST RESULTS: Genetic testing reported out on October 01, 2019 through the Multi-cancer panel found no pathogenic mutations. The test report has been scanned into EPIC and is located under the Molecular Pathology section of the Results Review tab.  A portion of the result report is included below for reference.     We discussed with Ms. Lizama that because current genetic testing is not perfect, it is possible there may be a gene mutation in one of these genes that current testing cannot detect, but that chance is small.  We also discussed, that there could be another gene that has not yet been discovered, or that we have not yet tested, that is responsible for the cancer diagnoses in the family. It is also possible there is a hereditary cause  for the cancer in the family that Ms. Amos did not inherit and therefore was not identified in her testing.  Therefore, it is important to remain in touch with cancer genetics in the future so that we can continue to offer Ms. Ritter the most up to date genetic testing.   Genetic testing did identify a variant of uncertain significance (VUS) was identified in the PTEN gene called c.1115A>G (p.Asn372Ser).  At this time, it is unknown if this variant is associated with increased cancer risk or if this is a normal finding, but most variants such as this get reclassified to being inconsequential. It should not be used to make medical management decisions. With time, we suspect the lab will determine the significance of this variant, if any. If we do learn  more about it, we will try to contact Ms. Igarashi to discuss it further. However, it is important to stay in touch with [place] periodically and keep the address and phone number up to date.  ADDITIONAL GENETIC TESTING: We discussed with Ms. Bruna that her genetic testing was fairly extensive.  If there are genes identified to increase cancer risk that can be analyzed in the future, we would be happy to discuss and coordinate this testing at that time.    CANCER SCREENING RECOMMENDATIONS: Ms. Heron's test result is considered negative (normal).  This means that we have not identified a hereditary cause for her personal and family history of cancer at this time. Most cancers happen by chance and this negative test suggests that her cancer may fall into this category.    While reassuring, this does not definitively rule out a hereditary predisposition to cancer. It is still possible that there could be genetic mutations that are undetectable by current technology. There could be genetic mutations in genes that have not been tested or identified to increase cancer risk.  Therefore, it is recommended she continue to follow the cancer management and screening guidelines provided by  her oncology and primary healthcare provider.   An individual's cancer risk and medical management are not determined by genetic test results alone. Overall cancer risk assessment incorporates additional factors, including personal medical history, family history, and any available genetic information that may result in a personalized plan for cancer prevention and surveillance  RECOMMENDATIONS FOR FAMILY MEMBERS:  Individuals in this family might be at some increased risk of developing cancer, over the general population risk, simply due to the family history of cancer.  We recommended women in this family have a yearly mammogram beginning at age 48, or 64 years younger than the earliest onset of cancer, an annual clinical breast exam, and perform monthly breast self-exams. Women in this family should also have a gynecological exam as recommended by their primary provider. All family members should have a colonoscopy by age 8.  FOLLOW-UP: Lastly, we discussed with Ms. Hase that cancer genetics is a rapidly advancing field and it is possible that new genetic tests will be appropriate for her and/or her family members in the future. We encouraged her to remain in contact with cancer genetics on an annual basis so we can update her personal and family histories and let her know of advances in cancer genetics that may benefit this family.   Our contact number was provided. Ms. Housh's questions were answered to her satisfaction, and she knows she is welcome to call us at anytime with additional questions or concerns.   Roma Kayser, [place], [facility] Licensed, Certified Genetic Counselor [email or web address]

## 2019-10-10 NOTE — Telephone Encounter (Signed)
Voice mail box is full and I cannot leave a message.  I resent the my Chart email again.

## 2019-11-01 NOTE — Telephone Encounter (Signed)
I called her but voice mail box is full and I cannot leave a message.

## 2019-11-09 NOTE — Telephone Encounter (Signed)
I called patient again. Voice mail not set up and I cannot leave a message.

## 2019-11-11 ENCOUNTER — Other Ambulatory Visit: Payer: Self-pay | Admitting: Family Medicine

## 2019-11-28 ENCOUNTER — Telehealth: Payer: Self-pay | Admitting: Family Medicine

## 2019-11-28 MED ORDER — LISDEXAMFETAMINE DIMESYLATE 40 MG PO CAPS: 40.0000 mg | ORAL_CAPSULE | ORAL | 0 refills | Status: DC

## 2019-11-28 NOTE — Telephone Encounter (Signed)
Pt is calling to see if she can get a refill on lisdexamfetamine (VYVANSE) 40 MG only have 2 pills.   Pharm:  [facility]  [employer] [employer] in [place]

## 2019-11-28 NOTE — Telephone Encounter (Signed)
Done

## 2019-11-28 NOTE — Addendum Note (Signed)
Addended by: [name] A on: 11/28/2019 05:02 PM   Modules accepted: Orders

## 2019-11-28 NOTE — Telephone Encounter (Signed)
Last Filled 10/16/2019 Last OV 09/19/2019  Ok to fill?

## 2019-12-20 ENCOUNTER — Inpatient Hospital Stay: Payer: 59 | Attending: Adult Health | Admitting: Adult Health

## 2019-12-27 ENCOUNTER — Other Ambulatory Visit: Payer: 59

## 2019-12-28 ENCOUNTER — Telehealth: Payer: Self-pay | Admitting: Family Medicine

## 2019-12-28 NOTE — Telephone Encounter (Signed)
[facility] pharmacy accidentally  Deleted  the refill requested for  Vyvanse  40 mg  for the pt .   want to know if it could be fax to [phone]

## 2019-12-28 NOTE — Telephone Encounter (Signed)
Could you please refill this as we cannot fax

## 2020-01-01 MED ORDER — LISDEXAMFETAMINE DIMESYLATE 40 MG PO CAPS: 40.0000 mg | ORAL_CAPSULE | ORAL | 0 refills | Status: DC

## 2020-01-01 NOTE — Telephone Encounter (Signed)
I resent these refills

## 2020-01-01 NOTE — Telephone Encounter (Signed)
Noted  

## 2020-01-19 ENCOUNTER — Other Ambulatory Visit: Payer: Self-pay | Admitting: Family Medicine

## 2020-01-19 NOTE — Telephone Encounter (Signed)
Last filled 07/10/2019 Last OV 09/19/2019  Ok to fill?

## 2020-01-22 HISTORY — PX: COLONOSCOPY: SHX174

## 2020-02-13 ENCOUNTER — Ambulatory Visit: Payer: 59 | Admitting: Family Medicine

## 2020-02-15 ENCOUNTER — Ambulatory Visit: Payer: 59 | Admitting: Family Medicine

## 2020-02-16 ENCOUNTER — Other Ambulatory Visit: Payer: Self-pay

## 2020-02-19 ENCOUNTER — Ambulatory Visit (INDEPENDENT_AMBULATORY_CARE_PROVIDER_SITE_OTHER): Payer: 59 | Admitting: Family Medicine

## 2020-02-19 ENCOUNTER — Other Ambulatory Visit: Payer: Self-pay

## 2020-02-19 ENCOUNTER — Ambulatory Visit (INDEPENDENT_AMBULATORY_CARE_PROVIDER_SITE_OTHER)
Admission: RE | Admit: 2020-02-19 | Discharge: 2020-02-19 | Disposition: A | Discharge status: Home / Self Care | Payer: 59 | Source: Ambulatory Visit | Attending: Family Medicine | Admitting: Family Medicine

## 2020-02-19 ENCOUNTER — Encounter: Payer: Self-pay | Admitting: Family Medicine

## 2020-02-19 VITALS — BP 124/64 | HR 90 | Temp 98.8°F | Wt 158.6 lb

## 2020-02-19 DIAGNOSIS — G8929 Other chronic pain: Secondary | ICD-10-CM | POA: Diagnosis not present

## 2020-02-19 DIAGNOSIS — M4802 Spinal stenosis, cervical region: Secondary | ICD-10-CM | POA: Diagnosis not present

## 2020-02-19 DIAGNOSIS — M25511 Pain in right shoulder: Secondary | ICD-10-CM | POA: Diagnosis not present

## 2020-02-19 NOTE — Progress Notes (Signed)
   Subjective:    Patient ID: Kristina Pearson, female    DOB: August 27, 1955, 64 y.o.   MRN: [id number]  HPI Here for several issues. First she has been having stiffness and pain in the hands and feet and the Diclofenac she is taking only helps somewhat. However she is only taking this once a day. Also she had seen Dr. 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 Now over the past 3 months the neck pain has been worse on the right side and it radiates down the right arm. It is very difficult to sleep due to the pain. Finally she has also been having stiffness and pain in the right shoulder which has been worse the past few months.    Review of Systems  Constitutional: Negative.   Respiratory: Negative.   Cardiovascular: Negative.   Musculoskeletal: Positive for arthralgias, neck pain and neck stiffness.       Objective:   Physical Exam Constitutional:      Appearance: Normal appearance.  Cardiovascular:     Rate and Rhythm: Normal rate and regular rhythm.     Pulses: Normal pulses.     Heart sounds: Normal heart sounds.  Pulmonary:     Effort: Pulmonary effort is normal.     Breath sounds: Normal breath sounds.  Musculoskeletal:     Comments: Right shoulder has full ROM but this causes her pain. Crepitus is present. She is also tender along the right side of the neck, ROM is full   Neurological:     Mental Status: She is alert.           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 I advised her to take the Diclofenac BID fir better results. We will get an Xray of the right shoulder today. Refer her back to Dr. Vertell Limber for the neck pain, now that she seems to have radicular pain in the arm. [name], MD

## 2020-02-20 ENCOUNTER — Encounter: Payer: Self-pay | Admitting: Family Medicine

## 2020-02-21 NOTE — Telephone Encounter (Signed)
I agree, the main source of the pain she is having is coming from pinched nerves in her neck. I did refer her to see Dr. Vertell Limber about this

## 2020-03-18 NOTE — Telephone Encounter (Signed)
I will let Dr. Vertell Limber order the tests he wants

## 2020-03-19 ENCOUNTER — Ambulatory Visit: Payer: 59

## 2020-03-19 ENCOUNTER — Ambulatory Visit
Admission: RE | Admit: 2020-03-19 | Discharge: 2020-03-19 | Disposition: A | Discharge status: Home / Self Care | Payer: 59 | Source: Ambulatory Visit | Attending: Adult Health | Admitting: Adult Health

## 2020-03-19 ENCOUNTER — Other Ambulatory Visit: Payer: Self-pay

## 2020-03-19 DIAGNOSIS — E2839 Other primary ovarian failure: Secondary | ICD-10-CM

## 2020-03-19 DIAGNOSIS — C50411 Malignant neoplasm of upper-outer quadrant of right female breast: Secondary | ICD-10-CM

## 2020-03-19 DIAGNOSIS — Z170 Estrogen receptor positive status [ER+]: Secondary | ICD-10-CM

## 2020-03-20 ENCOUNTER — Telehealth: Payer: Self-pay

## 2020-03-20 NOTE — Telephone Encounter (Signed)
Called pt to give below information. Pt verbalized thanks and understanding.  °

## 2020-03-20 NOTE — Telephone Encounter (Signed)
-----   Message from Gardenia Phlegm, NP sent at 03/20/2020  8:37 AM EDT ----- Bone density is stable.  Continue current regimen with calcium, vitamin d, weight bearing exercises, repeat in 2 years.   ----- Message ----- From: Interface, Rad Results In Sent: 03/20/2020   7:58 AM EDT To: Gardenia Phlegm, NP

## 2020-04-17 ENCOUNTER — Other Ambulatory Visit: Payer: Self-pay | Admitting: Family Medicine

## 2020-04-18 NOTE — Telephone Encounter (Signed)
Patient called in to request a refill on her medication. Patient states her last two refills are expired and pharmacy told her to contact her PCP    ALPRAZolam (XANAX) 0.5 MG tablet   [id number] [place], [place] - [place]

## 2020-04-18 NOTE — Telephone Encounter (Signed)
Last OV: 02/19/20 Last refill: 09/16/19 #60/5 refills

## 2020-04-30 ENCOUNTER — Encounter: Payer: Self-pay | Admitting: Obstetrics & Gynecology

## 2020-05-08 ENCOUNTER — Inpatient Hospital Stay: Payer: 59 | Admitting: Adult Health

## 2020-05-13 ENCOUNTER — Other Ambulatory Visit: Payer: Self-pay

## 2020-05-13 ENCOUNTER — Emergency Department (HOSPITAL_COMMUNITY): Payer: 59

## 2020-05-13 ENCOUNTER — Emergency Department (HOSPITAL_COMMUNITY)
Admission: EM | Admit: 2020-05-13 | Discharge: 2020-05-13 | Disposition: A | Discharge status: Home / Self Care | Payer: 59 | Attending: Emergency Medicine | Admitting: Emergency Medicine

## 2020-05-13 ENCOUNTER — Encounter (HOSPITAL_COMMUNITY): Payer: Self-pay | Admitting: Emergency Medicine

## 2020-05-13 DIAGNOSIS — R519 Headache, unspecified: Secondary | ICD-10-CM | POA: Insufficient documentation

## 2020-05-13 DIAGNOSIS — Z7982 Long term (current) use of aspirin: Secondary | ICD-10-CM | POA: Diagnosis not present

## 2020-05-13 DIAGNOSIS — Y9389 Activity, other specified: Secondary | ICD-10-CM | POA: Diagnosis not present

## 2020-05-13 DIAGNOSIS — Y92410 Unspecified street and highway as the place of occurrence of the external cause: Secondary | ICD-10-CM | POA: Insufficient documentation

## 2020-05-13 DIAGNOSIS — Z853 Personal history of malignant neoplasm of breast: Secondary | ICD-10-CM | POA: Insufficient documentation

## 2020-05-13 DIAGNOSIS — M25551 Pain in right hip: Secondary | ICD-10-CM | POA: Diagnosis not present

## 2020-05-13 DIAGNOSIS — E039 Hypothyroidism, unspecified: Secondary | ICD-10-CM | POA: Insufficient documentation

## 2020-05-13 DIAGNOSIS — M79671 Pain in right foot: Secondary | ICD-10-CM | POA: Diagnosis not present

## 2020-05-13 DIAGNOSIS — Z79899 Other long term (current) drug therapy: Secondary | ICD-10-CM | POA: Insufficient documentation

## 2020-05-13 DIAGNOSIS — Z87891 Personal history of nicotine dependence: Secondary | ICD-10-CM | POA: Diagnosis not present

## 2020-05-13 DIAGNOSIS — S46911A Strain of unspecified muscle, fascia and tendon at shoulder and upper arm level, right arm, initial encounter: Secondary | ICD-10-CM

## 2020-05-13 DIAGNOSIS — S4991XA Unspecified injury of right shoulder and upper arm, initial encounter: Secondary | ICD-10-CM | POA: Diagnosis present

## 2020-05-13 MED ORDER — HYDROCODONE-ACETAMINOPHEN 5-325 MG PO TABS
1.0000 | ORAL_TABLET | Freq: Once | ORAL | Status: AC
  Administered 2020-05-13: 1 via ORAL
  Filled 2020-05-13: qty 1
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
## 2020-05-13 NOTE — Progress Notes (Signed)
Orthopedic Tech Progress Note Patient Details:  Kristina Pearson 10-23-1955 [id number]  Ortho Devices Type of Ortho Device: Sling immobilizer Ortho Device/Splint Location: right Ortho Device/Splint Interventions: Application   Post Interventions Patient Tolerated: Well Instructions Provided: Care of device   [name] 05/13/2020, 2:43 PM

## 2020-05-13 NOTE — Discharge Instructions (Addendum)
X-ray showed no acute fractures or dislocation.  Overall suspect that you have a shoulder sprain.  Please use the sling for comfort follow-up with your primary care doctor as you will likely benefit from physical therapy.  Continue Tylenol Motrin for pain as well as ice.
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
## 2020-05-21 ENCOUNTER — Telehealth: Payer: Self-pay | Admitting: Family Medicine

## 2020-05-21 MED ORDER — LISDEXAMFETAMINE DIMESYLATE 40 MG PO CAPS: 40.0000 mg | ORAL_CAPSULE | ORAL | 0 refills | Status: DC

## 2020-05-21 NOTE — Telephone Encounter (Signed)
Done

## 2020-05-21 NOTE — Telephone Encounter (Signed)
Patient informed. 

## 2020-05-21 NOTE — Telephone Encounter (Signed)
Patient is calling and requesting a refill for lisdexamfetamine (VYVANSE) 40 MG capsule sent to [id number]  [place], [place] [address]  Phone:  [phone] Fax:  [phone] CB is [phone]

## 2020-05-22 ENCOUNTER — Ambulatory Visit (INDEPENDENT_AMBULATORY_CARE_PROVIDER_SITE_OTHER): Payer: 59 | Admitting: Family Medicine

## 2020-05-22 ENCOUNTER — Other Ambulatory Visit: Payer: Self-pay

## 2020-05-22 ENCOUNTER — Encounter: Payer: Self-pay | Admitting: Family Medicine

## 2020-05-22 VITALS — BP 130/82 | HR 83 | Temp 98.6°F | Ht 66.0 in | Wt 158.6 lb

## 2020-05-22 DIAGNOSIS — R42 Dizziness and giddiness: Secondary | ICD-10-CM

## 2020-05-22 DIAGNOSIS — M25511 Pain in right shoulder: Secondary | ICD-10-CM

## 2020-05-22 MED ORDER — HYDROCODONE-ACETAMINOPHEN 10-325 MG PO TABS: 1.0000 | ORAL_TABLET | ORAL | 0 refills | Status: AC | PRN

## 2020-05-22 MED ORDER — MECLIZINE HCL 25 MG PO TABS: 25.0000 mg | ORAL_TABLET | ORAL | 3 refills | Status: AC | PRN

## 2020-05-22 MED ORDER — GABAPENTIN 300 MG PO CAPS: 300.0000 mg | ORAL_CAPSULE | Freq: Every day | ORAL | 3 refills | Status: DC
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
## 2020-05-28 ENCOUNTER — Other Ambulatory Visit: Payer: Self-pay

## 2020-05-28 ENCOUNTER — Inpatient Hospital Stay: Payer: 59 | Attending: Adult Health | Admitting: Adult Health

## 2020-05-28 ENCOUNTER — Other Ambulatory Visit: Payer: Self-pay | Admitting: Orthopedic Surgery

## 2020-05-28 VITALS — BP 128/77 | HR 80 | Temp 97.7°F | Resp 18 | Ht 66.0 in | Wt 158.7 lb

## 2020-05-28 DIAGNOSIS — C50411 Malignant neoplasm of upper-outer quadrant of right female breast: Secondary | ICD-10-CM

## 2020-05-28 DIAGNOSIS — Z853 Personal history of malignant neoplasm of breast: Secondary | ICD-10-CM | POA: Insufficient documentation

## 2020-05-28 DIAGNOSIS — Z170 Estrogen receptor positive status [ER+]: Secondary | ICD-10-CM | POA: Insufficient documentation

## 2020-05-28 DIAGNOSIS — Z923 Personal history of irradiation: Secondary | ICD-10-CM | POA: Insufficient documentation

## 2020-05-28 DIAGNOSIS — M25611 Stiffness of right shoulder, not elsewhere classified: Secondary | ICD-10-CM

## 2020-05-28 DIAGNOSIS — R52 Pain, unspecified: Secondary | ICD-10-CM
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
## 2020-05-29 ENCOUNTER — Encounter: Payer: Self-pay | Admitting: Adult Health

## 2020-05-29 ENCOUNTER — Telehealth: Payer: Self-pay | Admitting: Adult Health

## 2020-05-29 ENCOUNTER — Ambulatory Visit
Admission: RE | Admit: 2020-05-29 | Discharge: 2020-05-29 | Disposition: A | Discharge status: Home / Self Care | Payer: 59 | Source: Ambulatory Visit | Attending: Orthopedic Surgery | Admitting: Orthopedic Surgery

## 2020-05-29 DIAGNOSIS — R52 Pain, unspecified: Secondary | ICD-10-CM

## 2020-05-29 DIAGNOSIS — M25611 Stiffness of right shoulder, not elsewhere classified: Secondary | ICD-10-CM

## 2020-05-29 NOTE — Telephone Encounter (Signed)
No 12/6 los. No changes made to pt's schedule.

## 2020-05-30 ENCOUNTER — Other Ambulatory Visit: Payer: Self-pay

## 2020-05-31 ENCOUNTER — Ambulatory Visit (INDEPENDENT_AMBULATORY_CARE_PROVIDER_SITE_OTHER): Payer: 59 | Admitting: Family Medicine

## 2020-05-31 ENCOUNTER — Encounter: Payer: Self-pay | Admitting: Family Medicine

## 2020-05-31 ENCOUNTER — Other Ambulatory Visit: Payer: Self-pay

## 2020-05-31 VITALS — BP 116/74 | HR 92 | Temp 98.7°F | Ht 66.0 in | Wt 155.8 lb

## 2020-05-31 DIAGNOSIS — L03114 Cellulitis of left upper limb: Secondary | ICD-10-CM

## 2020-05-31 DIAGNOSIS — M25511 Pain in right shoulder: Secondary | ICD-10-CM | POA: Diagnosis not present

## 2020-05-31 DIAGNOSIS — F900 Attention-deficit hyperactivity disorder, predominantly inattentive type: Secondary | ICD-10-CM

## 2020-05-31 MED ORDER — CELECOXIB 200 MG PO CAPS: 200.0000 mg | ORAL_CAPSULE | Freq: Two times a day (BID) | ORAL | 0 refills | Status: DC

## 2020-05-31 MED ORDER — LISDEXAMFETAMINE DIMESYLATE 50 MG PO CAPS: 50.0000 mg | ORAL_CAPSULE | Freq: Every day | ORAL | 0 refills | Status: DC

## 2020-05-31 MED ORDER — MUPIROCIN CALCIUM 2 % EX CREA: 1.0000 "application " | TOPICAL_CREAM | Freq: Two times a day (BID) | CUTANEOUS | 0 refills | Status: DC
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
## 2020-06-05 ENCOUNTER — Other Ambulatory Visit: Payer: Self-pay | Admitting: Family Medicine

## 2020-07-16 ENCOUNTER — Other Ambulatory Visit: Payer: Self-pay

## 2020-07-23 ENCOUNTER — Telehealth: Payer: Self-pay | Admitting: Family Medicine

## 2020-07-23 ENCOUNTER — Other Ambulatory Visit: Payer: Self-pay

## 2020-07-23 ENCOUNTER — Ambulatory Visit: Payer: 59 | Attending: Physician Assistant | Admitting: Physical Therapy

## 2020-07-23 ENCOUNTER — Encounter: Payer: Self-pay | Admitting: Physical Therapy

## 2020-07-23 DIAGNOSIS — M25611 Stiffness of right shoulder, not elsewhere classified: Secondary | ICD-10-CM | POA: Diagnosis present

## 2020-07-23 DIAGNOSIS — M6281 Muscle weakness (generalized): Secondary | ICD-10-CM | POA: Insufficient documentation

## 2020-07-23 DIAGNOSIS — G8929 Other chronic pain: Secondary | ICD-10-CM | POA: Insufficient documentation

## 2020-07-23 DIAGNOSIS — M25511 Pain in right shoulder: Secondary | ICD-10-CM | POA: Diagnosis present

## 2020-07-23 MED ORDER — CYCLOBENZAPRINE HCL 10 MG PO TABS: 10.0000 mg | ORAL_TABLET | Freq: Three times a day (TID) | ORAL | 2 refills | Status: DC | PRN

## 2020-07-23 NOTE — Patient Instructions (Signed)
Access Code: [email or web address] URL: [email or web address] Date: 07/23/2020 Prepared by: Hilda Blades  Exercises Supine Shoulder External Rotation AAROM with Dowel - 2-3 x daily - 7 x weekly - 10 reps - 10 seconds hold Seated Shoulder Flexion Towel Slide at Table Top - 2-3 x daily - 7 x weekly - 10 reps - 10 seconds hold

## 2020-07-23 NOTE — Telephone Encounter (Signed)
Pt is aware.  

## 2020-07-23 NOTE — Telephone Encounter (Signed)
Pt call and stated she want a call back about her muscle relaxer and she stated she will be in therapy from 12 to 1:15.

## 2020-07-23 NOTE — Telephone Encounter (Signed)
Done

## 2020-07-23 NOTE — Therapy (Signed)
[place], [place], [address] Phone: [phone]   Fax:  [phone]  Physical Therapy Evaluation  Patient Details  Name: Kristina Pearson MRN: [id number] Date of Birth: [birth year] Referring Provider (PT): Currence, Gasper Sells, [place]   Encounter Date: 07/23/2020   PT End of Session - 07/23/20 1247    Visit Number 1    Number of Visits 20    Date for PT Re-Evaluation 10/01/20    Authorization Type AETNA    Authorization Time Period FOTO by 6th visit    PT Start Time 1221    PT Stop Time 1310    PT Time Calculation (min) 49 min    Activity Tolerance Patient tolerated treatment well    Behavior During Therapy [facility] for tasks assessed/performed           Past Medical History:  Diagnosis Date  . ADHD   . ADHD (attention deficit hyperactivity disorder)    ADD  . Allergy   . Anxiety   . Arthritis    osteoarthritrs  . Breast cancer ([place]) 08/15/13   right lateral upper outer  . Depression   . Family history of breast cancer   . Family history of colon cancer   . Family history of melanoma   . Family history of ovarian cancer   . Fibromyalgia   . GERD (gastroesophageal reflux disease)    TUMS  . [id number](671.2)    sees Dr. Gwendel Hanson at [facility]   . History of depression   . Hx of radiation therapy 10/19/13- 11/09/13   right breast 4256 cGy in 16 sessions, hypo-fractionated  . Hypothyroid   . Migraine   . Osteopenia   . Personal history of radiation therapy 2015  . PONV (postoperative nausea and vomiting)   . Sialoadenitis   . Spinal stenosis   . Urinary incontinence   . Vitiligo   . Wears glasses     Past Surgical History:  Procedure Laterality Date  . BREAST BIOPSY Right 07/2013  . BREAST LUMPECTOMY Right 08/2013  . BREAST LUMPECTOMY WITH RADIOACTIVE SEED LOCALIZATION Right 08/30/2013   Procedure: RIGHT PARTIAL MASTECTOMY WITH RADIOACTIVE SEED LOCALIZATION;  Surgeon: [name], MD;  Location: [place];  Service: General;  Laterality: Right;  . BREAST SURGERY    . CHOLECYSTECTOMY  1996   lap  . COLONOSCOPY  2007   per Dr. Cristina Gong, clear, repeat in 10 yrs  . DIAGNOSTIC LAPAROSCOPY  1982   exp  . LAPAROSCOPIC OVARIAN CYSTECTOMY    . URETHRAL DILATION      There were no vitals filed for this visit.    Subjective Assessment - 07/23/20 1224    Subjective Patient s/p right arthroscopic rotator cuff and decompression. Patient reports pain is under control, she is taking pain medication as needed in the morning and then before PT.    Pertinent History History of cervical stenosis    Patient Stated Goals Get full use of right arm and recondition her body    Currently in Pain? Yes    Pain Score 0-No pain    Pain Location Shoulder    Pain Orientation Right    Pain Type Surgical pain              [facility] PT Assessment - 07/23/20 0001      Assessment   Medical Diagnosis Right rotator cuff repair    Referring Provider (PT) Currence, Gasper Sells, PA-C  Onset Date/Surgical Date 07/03/20    Hand Dominance Right    Next MD Visit 08/06/2020      Precautions   Precautions Shoulder    Type of Shoulder Precautions Rotator cuff protocol      Restrictions   Weight Bearing Restrictions No      Balance Screen   Has the patient fallen in the past 6 months No    Has the patient had a decrease in activity level because of a fear of falling?  No    Is the patient reluctant to leave their home because of a fear of falling?  No      Home Ecologist residence    Living Arrangements Spouse/significant other    Available Help at Discharge Family    Type of [place]      Prior Function   Level of Independence Independent with basic ADLs    Vocation Retired    Facilities manager, Engineer, site, write, Higher education careers adviser   Overall Cognitive Status Within Functional Limits for tasks assessed      Observation/Other  Assessments   Observations Patient appears in no apparent distress, sling with abduction pillow donned appropriately    Focus on Therapeutic Outcomes (FOTO)  20% functional status      Sensation   Light Touch Appears Intact      Posture/Postural Control   Posture Comments Rounded shoulder posturing      ROM / Strength   AROM / PROM / Strength PROM      PROM   PROM Assessment Site Shoulder    Right/Left Shoulder Right    Right Shoulder Flexion 90 Degrees   scapular plane   Right Shoulder External Rotation 35 Degrees   at 30 deg abd in scapular plane     Palpation   Palpation comment Not assessed      Special Tests   Other special tests Not assessed                      Objective measurements completed on examination: See above findings.       [facility] Adult PT Treatment/Exercise - 07/23/20 0001      Exercises   Exercises Shoulder      Shoulder Exercises: Supine   External Rotation 5 reps   10 sec hold   External Rotation Limitations dowel PROM      Shoulder Exercises: Seated   Flexion 5 reps   10 sec hold   Flexion Limitations table slide                  PT Education - 07/23/20 1246    Education Details Exam findings, POC, HEP    Person(s) Educated Patient    Methods Explanation;Demonstration;Tactile cues;Verbal cues;Handout    Comprehension Verbalized understanding;Returned demonstration;Tactile cues required;Verbal cues required;Need further instruction            PT Short Term Goals - 07/23/20 1338      PT SHORT TERM GOAL #1   Title Patient will be I with initial HEP to progress with PT    Time 4    Period Weeks    Status New    Target Date 08/20/20      PT SHORT TERM GOAL #2   Title Patient will demonstrate PROM >/= 120 deg elevation to reduce shoulder stiffness and progress allowable range    Time 4    Period Weeks  Status New    Target Date 08/20/20      PT SHORT TERM GOAL #3   Title Patient will report </= 5/10 pain  with therapy to reduce limitation and allow progression within protocol    Time 4    Period Weeks    Status New    Target Date 08/20/20      PT SHORT TERM GOAL #4   Title PT will review FOTO with patient by 3rd visit    Time 3    Period Weeks    Status New    Target Date 08/13/20             PT Long Term Goals - 07/23/20 1323      PT LONG TERM GOAL #1   Title Patient will be I with final HEP to maintain progress from PT    Time 10    Period Weeks    Status New    Target Date 10/01/20      PT LONG TERM GOAL #2   Title Patient will report improved functional status >/= 55% on FOTO    Time 12    Period Weeks    Status New    Target Date 10/15/20      PT LONG TERM GOAL #3   Title Patient will demonstrate right shoulder AROM grossly WFL to return to swimming    Time 10    Period Weeks    Status New    Target Date 10/01/20      PT LONG TERM GOAL #4   Title Patient will demonstrate right shoulder rotator cuff strength >/= 4/5 MMT to be able to lift objects into top cabinets    Time 12    Period Weeks    Status New    Target Date 10/15/20                  Plan - 07/23/20 1346    Clinical Impression Statement Patient presents to PT s/p right rotator cuff repair and subacromial decompression. She exhibits limitation with shoulder PROM and per protocol is not allowed to perform AROM or strengthening at this time. Patient with increased pain at end range of shoulder motion and moderate guarding with PROM. She was provided exercises to initiate shoulder motion at home and encouraged to continue with elbow, wrist, hand exercises previously provided. Patient would benefit from continued skilled PT to progress shoulder mobility and strength to reduce pain and return to prior level of function such as painting and swimming.    Personal Factors and Comorbidities Past/Current Experience;Comorbidity 3+    Comorbidities History of chronic neck pain, anxiety, depression, breast  cancer    Examination-Activity Limitations Bathing;Carry;Dressing;Hygiene/Grooming;Lift;Sleep;Reach Overhead    Examination-Participation Restrictions Meal Prep;Cleaning;Driving;Shop;Yard Work;Laundry    Stability/Clinical Decision Making Evolving/Moderate complexity    Clinical Decision Making Moderate    Rehab Potential Good    PT Frequency 2x / week    PT Duration 8 weeks    PT Treatment/Interventions ADLs/Self Care Home Management;Aquatic Therapy;Cryotherapy;Electrical Stimulation;Iontophoresis 4mg /ml Dexamethasone;Moist Heat;Neuromuscular re-education;Therapeutic exercise;Therapeutic activities;Functional mobility training;Patient/family education;Manual techniques;Dry needling;Passive range of motion;Taping;Joint Manipulations    PT Next Visit Plan Review HEP and progress PRN, manual PRN for bicep and pain reduction, continue PROM for right shoulder, stretching for right elbow, elbow/wrist/hand AROM    PT Home Exercise Plan [id number]    Consulted and Agree with Plan of Care Patient           Patient will benefit from skilled therapeutic  intervention in order to improve the following deficits and impairments:  Decreased range of motion,Postural dysfunction,Decreased strength,Pain,Decreased activity tolerance,Impaired UE functional use  Visit Diagnosis: Chronic right shoulder pain  Stiffness of right shoulder, not elsewhere classified  Muscle weakness (generalized)     Problem List Patient Active Problem List   Diagnosis Date Noted  . Genetic testing 10/02/2019  . Environmental and seasonal allergies 09/19/2019  . Family history of colon cancer   . Family history of melanoma   . Family history of ovarian cancer   . Family history of breast cancer   . Anxiety disorder 01/09/2019  . Cervical stenosis of spine 10/21/2018  . Insomnia 10/21/2018  . Breast tenderness in female 02/05/2014  . Pes planus of both feet 01/15/2014  . Low back pain 11/23/2013  . Phlebitis 10/06/2013   . History of depression 09/26/2013  . Breast cancer of upper-outer quadrant of right female breast ([place]) 08/18/2013  . ADHD (attention deficit hyperactivity disorder) 12/01/2011  . Headache 04/03/2009  . Hypothyroidism 05/05/2007  . GERD 05/05/2007  . Osteoarthritis 05/05/2007    Hilda Blades, PT, DPT, LAT, ATC 07/23/20  2:47 PM Phone: [phone] Fax: [facility] [address] [place], [place], [address] Phone: [phone]   Fax:  [phone]  Name: TONIESHA POPAL MRN: [id number] Date of Birth: 08-26-1955

## 2020-07-23 NOTE — Telephone Encounter (Signed)
Pt says her Methocarbamol has been keeping her awake since she has her shoulder surgery. She says she used to take Cyclobenzaprine but stopped it because it made her too drowsy. She would like to know if you can call her in a script of the Cyclobenzaprine to take for now in the evenings since the Methocarbamol no longer works. This will go to [facility]. She says she will stop the Methocarbamol while she takes it.

## 2020-07-24 ENCOUNTER — Encounter: Payer: Self-pay | Admitting: Family Medicine

## 2020-07-24 ENCOUNTER — Telehealth (INDEPENDENT_AMBULATORY_CARE_PROVIDER_SITE_OTHER): Payer: 59 | Admitting: Family Medicine

## 2020-07-24 DIAGNOSIS — Z9889 Other specified postprocedural states: Secondary | ICD-10-CM | POA: Diagnosis not present

## 2020-07-24 NOTE — Progress Notes (Signed)
Subjective:    Patient ID: Kristina Pearson, female    DOB: [birth year], 65 y.o.   MRN: [id number]  HPI Virtual Visit via Video Note  I connected with the patient on 07/24/20 at  2:15 PM EST by a video enabled telemedicine application and verified that I am speaking with the correct person using two identifiers.  Location patient: home Location provider:work or home office Persons participating in the virtual visit: patient, provider  I discussed the limitations of evaluation and management by telemedicine and the availability of in person appointments. The patient expressed understanding and agreed to proceed.   HPI: Here with concerns about a medication. She recently had rotator cuff surgery per Dr. Ronnie Derby, and she is undergoing PT now. Yesterday she had requested refills for Cyclobenzaprine from Dr. Ruel Favors office, and the first repsonse she got from them was that they preferred she ask [place] instead. In fact she did ask [place], and we sent in refills yesterday. When she got to the pharmacy was told that Dr. Ruel Favors office had refilled it anyway, so that she now had duplicate prescriptions. She told them to cancel the one from Dr. Ronnie Derby, and they filled the one from [place]. She is worried that this might be a controlled substance and that she might "get in trouble" for having 2 prescriptions for it.    ROS: See pertinent positives and negatives per HPI.  Past Medical History:  Diagnosis Date  . ADHD   . ADHD (attention deficit hyperactivity disorder)    ADD  . Allergy   . Anxiety   . Arthritis    osteoarthritrs  . Breast cancer ([facility] [facility]) 08/15/13   right lateral upper outer  . Depression   . Family history of breast cancer   . Family history of colon cancer   . Family history of melanoma   . Family history of ovarian cancer   . Fibromyalgia   . GERD (gastroesophageal reflux disease)    TUMS  . NWGNFAOZ(308.6)    sees Dr. Gwendel Hanson at [facility]   . History of depression   .  Hx of radiation therapy 10/19/13- 11/09/13   right breast 4256 cGy in 16 sessions, hypo-fractionated  . Hypothyroid   . Migraine   . Osteopenia   . Personal history of radiation therapy 2015  . PONV (postoperative nausea and vomiting)   . Sialoadenitis   . Spinal stenosis   . Urinary incontinence   . Vitiligo   . Wears glasses     Past Surgical History:  Procedure Laterality Date  . BREAST BIOPSY Right 07/2013  . BREAST LUMPECTOMY Right 08/2013  . BREAST LUMPECTOMY WITH RADIOACTIVE SEED LOCALIZATION Right 08/30/2013   Procedure: RIGHT PARTIAL MASTECTOMY WITH RADIOACTIVE SEED LOCALIZATION;  Surgeon: [name], MD;  Location: [place];  Service: General;  Laterality: Right;  . BREAST SURGERY    . CHOLECYSTECTOMY  1996   lap  . COLONOSCOPY  2007   per Dr. Cristina Gong, clear, repeat in 10 yrs  . DIAGNOSTIC LAPAROSCOPY  1982   exp  . LAPAROSCOPIC OVARIAN CYSTECTOMY    . URETHRAL DILATION      Family History  Problem Relation Age of Onset  . Arthritis Mother   . Diabetes Mother   . Osteoporosis Mother   . Polymyalgia rheumatica Mother        on prednisone  . Heart disease Mother   . Hyperlipidemia Mother   . Hypertension Mother   . Stroke  Mother   . Heart attack Father   . Heart disease Father   . Melanoma Father   . Colon cancer Maternal Uncle        dx in his 52s  . Colon cancer Cousin 82       mother's maternal first cousin  . Breast cancer Cousin 56       BRCA negative; father's maternal first cousin  . Leukemia Maternal Uncle 81       AML  . Melanoma Maternal Uncle 73  . Leukemia Maternal Uncle 39  . Ovarian cancer Cousin 24       maternal cousin  . Ovarian cancer Cousin 24       maternal cousin  . Leukemia Cousin        dx in his 65s; maternal cousin; thought to be the result of taking [place]  . Heart attack Paternal Uncle   . Melanoma Paternal Uncle   . Colon cancer Cousin 41       maternal second cousin  . Breast cancer Other         maternal great aunt dx in her 31s  . Ovarian cancer Other        maternal grandfather's sister dx in her 51s  . Colon cancer Other        maternal grandfather's sister dx in her 46s  . Colon cancer Other        maternal grandfather's sister dx in her 62s  . Other Daughter        dermoid tumor  . Diabetes Paternal Grandmother   . Skin cancer Paternal Grandmother   . Hypertension Paternal Grandmother   . Melanoma Paternal Grandmother   . Congestive Heart Failure Maternal Grandmother   . Diabetes Maternal Grandmother   . Other Maternal Aunt        COVID     Current Outpatient Medications:  .  ALPRAZolam (XANAX) 0.5 MG tablet, TAKE 1 TABLET (0.5 MG TOTAL) BY MOUTH 2 (TWO) TIMES DAILY AS NEEDED FOR ANXIETY., Disp: 60 tablet, Rfl: 5 .  aspirin EC 81 MG tablet, Take 1 tablet (81 mg total) by mouth daily., Disp: 1 tablet, Rfl: 0 .  Boswellia Serrata (BOSWELLIA PO), Take by mouth., Disp: , Rfl:  .  celecoxib (CELEBREX) 200 MG capsule, Take 1 capsule (200 mg total) by mouth 2 (two) times daily., Disp: 60 capsule, Rfl: 0 .  cetirizine (ZYRTEC) 10 MG tablet, Take 1 tablet (10 mg total) by mouth 2 (two) times daily., Disp: 2 tablet, Rfl: 0 .  cyclobenzaprine (FLEXERIL) 10 MG tablet, Take 1 tablet (10 mg total) by mouth 3 (three) times daily as needed for muscle spasms., Disp: 90 tablet, Rfl: 2 .  cycloSPORINE (RESTASIS) 0.05 % ophthalmic emulsion, Place 1 drop into both eyes 2 (two) times daily., Disp: , Rfl:  .  diclofenac (VOLTAREN) 75 MG EC tablet, TAKE 1 TABLET BY MOUTH TWICE A DAY, Disp: 60 tablet, Rfl: 5 .  estradiol (ESTRACE VAGINAL) 0.1 MG/GM vaginal cream, Place 1 Applicatorful vaginally 3 (three) times a week., Disp: 42.5 g, Rfl: 12 .  gabapentin (NEURONTIN) 100 MG capsule, Take 1 capsule (100 mg total) by mouth 3 (three) times daily as needed (headaches). Has been taking 300 mg daily, Disp: 90 capsule, Rfl: 5 .  gabapentin (NEURONTIN) 300 MG capsule, Take 1 capsule (300 mg total) by  mouth at bedtime., Disp: 30 capsule, Rfl: 3 .  levothyroxine (SYNTHROID, LEVOTHROID) 112 MCG tablet, Take 112 mcg by mouth  daily before breakfast., Disp: , Rfl:  .  lisdexamfetamine (VYVANSE) 50 MG capsule, Take 1 capsule (50 mg total) by mouth daily., Disp: 30 capsule, Rfl: 0 .  Magnesium 400 MG CAPS, Take by mouth daily., Disp: , Rfl:  .  meclizine (ANTIVERT) 25 MG tablet, Take 1 tablet (25 mg total) by mouth every 4 (four) hours as needed for dizziness or nausea., Disp: 60 tablet, Rfl: 3 .  Multiple Vitamin (MULTIVITAMIN) tablet, Take 1 tablet by mouth daily. One a day for over age 3, Disp: , Rfl:  .  mupirocin cream (BACTROBAN) 2 %, Apply 1 application topically 2 (two) times daily., Disp: 30 g, Rfl: 0 .  oxyCODONE (OXY IR/ROXICODONE) 5 MG immediate release tablet, Take 5 mg by mouth 4 (four) times daily as needed., Disp: , Rfl:  .  temazepam (RESTORIL) 30 MG capsule, TAKE 1 CAPSULE (30 MG TOTAL) BY MOUTH EVERY DAY AT BEDTIME AS NEEDED FOR SLEEP., Disp: 30 capsule, Rfl: 5 .  traZODone (DESYREL) 50 MG tablet, 1 1/2 tablets  at bedtime, Disp: , Rfl:  .  UNABLE TO FIND, CBD oil 20 mg - half a dropper at bedtime for pain or scalp psoriases, Disp: , Rfl:  .  albuterol (VENTOLIN HFA) 108 (90 Base) MCG/ACT inhaler, Inhale 2 puffs into the lungs every 4 (four) hours as needed. (Patient not taking: Reported on 07/24/2020), Disp: , Rfl:   EXAM:  VITALS per patient if applicable:  GENERAL: alert, oriented, appears well and in no acute distress  HEENT: atraumatic, conjunttiva clear, no obvious abnormalities on inspection of external nose and ears  NECK: normal movements of the head and neck  LUNGS: on inspection no signs of respiratory distress, breathing rate appears normal, no obvious gross SOB, gasping or wheezing  CV: no obvious cyanosis  MS: moves all visible extremities without noticeable abnormality  PSYCH/NEURO: pleasant and cooperative, no obvious depression or anxiety, speech and  thought processing grossly intact  ASSESSMENT AND PLAN: Duplicate prescriptions. I reassured her that this is not a controlled medication and that the problem has been resolved. She does not need to worry about this at all. Follow up as needed.  [name], MD  Discussed the following assessment and plan:  No diagnosis found.     I discussed the assessment and treatment plan with the patient. The patient was provided an opportunity to ask questions and all were answered. The patient agreed with the plan and demonstrated an understanding of the instructions.   The patient was advised to call back or seek an in-person evaluation if the symptoms worsen or if the condition fails to improve as anticipated.     Review of Systems     Objective:   Physical Exam        Assessment & Plan:

## 2020-07-26 ENCOUNTER — Other Ambulatory Visit: Payer: Self-pay

## 2020-07-26 ENCOUNTER — Ambulatory Visit: Payer: 59 | Admitting: Physical Therapy

## 2020-07-26 ENCOUNTER — Encounter: Payer: Self-pay | Admitting: Physical Therapy

## 2020-07-26 DIAGNOSIS — G8929 Other chronic pain: Secondary | ICD-10-CM

## 2020-07-26 DIAGNOSIS — M25511 Pain in right shoulder: Secondary | ICD-10-CM | POA: Diagnosis not present

## 2020-07-26 DIAGNOSIS — M25611 Stiffness of right shoulder, not elsewhere classified: Secondary | ICD-10-CM

## 2020-07-26 DIAGNOSIS — M6281 Muscle weakness (generalized): Secondary | ICD-10-CM

## 2020-07-26 NOTE — Therapy (Signed)
[place], [place], [address] Phone: [phone]   Fax:  [phone]  Physical Therapy Treatment  Patient Details  Name: Kristina Pearson MRN: [id number] Date of Birth: September 09, 1955 Referring Provider (PT): Currence, Gasper Sells, [place]   Encounter Date: 07/26/2020   PT End of Session - 07/26/20 0829    Visit Number 2    Number of Visits 20    Date for PT Re-Evaluation 10/01/20    Authorization Type AETNA    Authorization Time Period FOTO by 6th visit    PT Start Time 0830    PT Stop Time 0910    PT Time Calculation (min) 40 min    Activity Tolerance Patient tolerated treatment well    Behavior During Therapy [facility] for tasks assessed/performed           Past Medical History:  Diagnosis Date  . ADHD   . ADHD (attention deficit hyperactivity disorder)    ADD  . Allergy   . Anxiety   . Arthritis    osteoarthritrs  . Breast cancer ([place]) 08/15/13   right lateral upper outer  . Depression   . Family history of breast cancer   . Family history of colon cancer   . Family history of melanoma   . Family history of ovarian cancer   . Fibromyalgia   . GERD (gastroesophageal reflux disease)    TUMS  . [id number]    sees Dr. Gwendel Hanson at [facility]   . History of depression   . Hx of radiation therapy 10/19/13- 11/09/13   right breast 4256 cGy in 16 sessions, hypo-fractionated  . Hypothyroid   . Migraine   . Osteopenia   . Personal history of radiation therapy 2015  . PONV (postoperative nausea and vomiting)   . Sialoadenitis   . Spinal stenosis   . Urinary incontinence   . Vitiligo   . Wears glasses     Past Surgical History:  Procedure Laterality Date  . BREAST BIOPSY Right 07/2013  . BREAST LUMPECTOMY Right 08/2013  . BREAST LUMPECTOMY WITH RADIOACTIVE SEED LOCALIZATION Right 08/30/2013   Procedure: RIGHT PARTIAL MASTECTOMY WITH RADIOACTIVE SEED LOCALIZATION;  Surgeon: [name], MD;  Location: [facility];  Service: General;  Laterality: Right;  . BREAST SURGERY    . CHOLECYSTECTOMY  1996   lap  . COLONOSCOPY  2007   per Dr. Cristina Gong, clear, repeat in 10 yrs  . DIAGNOSTIC LAPAROSCOPY  1982   exp  . LAPAROSCOPIC OVARIAN CYSTECTOMY    . URETHRAL DILATION      There were no vitals filed for this visit.   Subjective Assessment - 07/26/20 0920    Subjective Patient reports she is doing well with no new issues. Her new exercises are going well at home. Continues to have pain at night but medication helps with her sleeping.    Patient Stated Goals Get full use of right arm and recondition her body    Currently in Pain? No/denies              [facility] PT Assessment - 07/26/20 0001      PROM   Right Shoulder Flexion 95 Degrees                         [facility] Adult PT Treatment/Exercise - 07/26/20 0001      Exercises   Exercises Shoulder  Shoulder Exercises: Seated   Retraction 10 reps   5 sec hold   Retraction Limitations scap squeeze to neutral, cue to keep elbow/arm still    Other Seated Exercises Cervical AROM all directions      Manual Therapy   Manual Therapy Passive ROM;Soft tissue mobilization    Soft tissue mobilization Right bicep region    Passive ROM Right elbow extension, right shoulder elevation and ER in scapular plane                  PT Education - 07/26/20 0828    Education Details HEP    Person(s) Educated Patient    Methods Explanation    Comprehension Verbalized understanding;Need further instruction            PT Short Term Goals - 07/23/20 1338      PT SHORT TERM GOAL #1   Title Patient will be I with initial HEP to progress with PT    Time 4    Period Weeks    Status New    Target Date 08/20/20      PT SHORT TERM GOAL #2   Title Patient will demonstrate PROM >/= 120 deg elevation to reduce shoulder stiffness and progress allowable range    Time 4    Period Weeks     Status New    Target Date 08/20/20      PT SHORT TERM GOAL #3   Title Patient will report </= 5/10 pain with therapy to reduce limitation and allow progression within protocol    Time 4    Period Weeks    Status New    Target Date 08/20/20      PT SHORT TERM GOAL #4   Title PT will review FOTO with patient by 3rd visit    Time 3    Period Weeks    Status New    Target Date 08/13/20             PT Long Term Goals - 07/23/20 1323      PT LONG TERM GOAL #1   Title Patient will be I with final HEP to maintain progress from PT    Time 10    Period Weeks    Status New    Target Date 10/01/20      PT LONG TERM GOAL #2   Title Patient will report improved functional status >/= 55% on FOTO    Time 12    Period Weeks    Status New    Target Date 10/15/20      PT LONG TERM GOAL #3   Title Patient will demonstrate right shoulder AROM grossly WFL to return to swimming    Time 10    Period Weeks    Status New    Target Date 10/01/20      PT LONG TERM GOAL #4   Title Patient will demonstrate right shoulder rotator cuff strength >/= 4/5 MMT to be able to lift objects into top cabinets    Time 12    Period Weeks    Status New    Target Date 10/15/20                 Plan - 07/26/20 0918    Clinical Impression Statement Patient tolerated therapy well with no adverse effects. Therapy focused primarily on PROM this visit for right elbow and shoulder. Her shoulder PROM is progressing well within allowable range but does exhibit some bicep/elbow tightness  limiting full extension so worked on manual to improve this. Incorporated some postural exercises and encouraged patient to work on neck AROM/stretching to reduce tension. Patient would benefit from continued skilled PT to progress shoulder mobility and strength to reduce pain and return to prior level of function such as painting and swimming.    PT Treatment/Interventions ADLs/Self Care Home Management;Aquatic  Therapy;Cryotherapy;Electrical Stimulation;Iontophoresis 4mg /ml Dexamethasone;Moist Heat;Neuromuscular re-education;Therapeutic exercise;Therapeutic activities;Functional mobility training;Patient/family education;Manual techniques;Dry needling;Passive range of motion;Taping;Joint Manipulations    PT Next Visit Plan Review HEP and progress PRN, manual PRN for bicep and pain reduction, continue PROM for right shoulder, stretching for right elbow, elbow/wrist/hand AROM    PT Home Exercise Plan [id number]    Consulted and Agree with Plan of Care Patient           Patient will benefit from skilled therapeutic intervention in order to improve the following deficits and impairments:  Decreased range of motion,Postural dysfunction,Decreased strength,Pain,Decreased activity tolerance,Impaired UE functional use  Visit Diagnosis: Chronic right shoulder pain  Stiffness of right shoulder, not elsewhere classified  Muscle weakness (generalized)     Problem List Patient Active Problem List   Diagnosis Date Noted  . Genetic testing 10/02/2019  . Environmental and seasonal allergies 09/19/2019  . Family history of colon cancer   . Family history of melanoma   . Family history of ovarian cancer   . Family history of breast cancer   . Anxiety disorder 01/09/2019  . Cervical stenosis of spine 10/21/2018  . Insomnia 10/21/2018  . Breast tenderness in female 02/05/2014  . Pes planus of both feet 01/15/2014  . Low back pain 11/23/2013  . Phlebitis 10/06/2013  . History of depression 09/26/2013  . Breast cancer of upper-outer quadrant of right female breast ([place]) 08/18/2013  . ADHD (attention deficit hyperactivity disorder) 12/01/2011  . Headache 04/03/2009  . Hypothyroidism 05/05/2007  . GERD 05/05/2007  . Osteoarthritis 05/05/2007    Hilda Blades, PT, DPT, LAT, ATC 07/26/20  9:22 AM Phone: [phone] Fax: [facility] [place], [place], [address] Phone: [phone]   Fax:  [phone]  Name: Kristina Pearson MRN: [id number] Date of Birth: 17-Apr-1956

## 2020-07-29 ENCOUNTER — Ambulatory Visit: Payer: 59 | Admitting: Physical Therapy

## 2020-07-29 ENCOUNTER — Encounter: Payer: Self-pay | Admitting: Physical Therapy

## 2020-07-29 ENCOUNTER — Other Ambulatory Visit: Payer: Self-pay

## 2020-07-29 DIAGNOSIS — M6281 Muscle weakness (generalized): Secondary | ICD-10-CM

## 2020-07-29 DIAGNOSIS — G8929 Other chronic pain: Secondary | ICD-10-CM

## 2020-07-29 DIAGNOSIS — M25611 Stiffness of right shoulder, not elsewhere classified: Secondary | ICD-10-CM

## 2020-07-29 DIAGNOSIS — M25511 Pain in right shoulder: Secondary | ICD-10-CM | POA: Diagnosis not present
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
## 2020-07-31 ENCOUNTER — Ambulatory Visit: Payer: 59 | Admitting: Physical Therapy

## 2020-07-31 ENCOUNTER — Encounter: Payer: Self-pay | Admitting: Physical Therapy

## 2020-07-31 ENCOUNTER — Other Ambulatory Visit: Payer: Self-pay

## 2020-07-31 DIAGNOSIS — M25511 Pain in right shoulder: Secondary | ICD-10-CM | POA: Diagnosis not present

## 2020-07-31 DIAGNOSIS — G8929 Other chronic pain: Secondary | ICD-10-CM

## 2020-07-31 DIAGNOSIS — M6281 Muscle weakness (generalized): Secondary | ICD-10-CM

## 2020-07-31 DIAGNOSIS — M25611 Stiffness of right shoulder, not elsewhere classified: Secondary | ICD-10-CM

## 2020-07-31 NOTE — Therapy (Addendum)
[place], [place], [address] Phone: [phone]   Fax:  [phone]  Physical Therapy Treatment  Patient Details  Name: Kristina Pearson MRN: [id number] Date of Birth: 23-Dec-1955 Referring Provider (PT): Currence, Gasper Sells, [place]   Encounter Date: 07/31/2020   PT End of Session - 07/31/20 1134    Visit Number 4    Number of Visits 20    Date for PT Re-Evaluation 10/01/20    Authorization Type AETNA    Authorization Time Period FOTO by 6th visit    PT Start Time 1134    PT Stop Time 1221    PT Time Calculation (min) 47 min    Equipment Utilized During Treatment Gait belt    Activity Tolerance Patient limited by pain;Patient tolerated treatment well   slow movements during PROM today secondary to pain   Behavior During Therapy [facility] for tasks assessed/performed           Past Medical History:  Diagnosis Date  . ADHD   . ADHD (attention deficit hyperactivity disorder)    ADD  . Allergy   . Anxiety   . Arthritis    osteoarthritrs  . Breast cancer ([place]) 08/15/13   right lateral upper outer  . Depression   . Family history of breast cancer   . Family history of colon cancer   . Family history of melanoma   . Family history of ovarian cancer   . Fibromyalgia   . GERD (gastroesophageal reflux disease)    TUMS  . DUKGURKY(706.2)    sees Dr. Gwendel Hanson at [facility]   . History of depression   . Hx of radiation therapy 10/19/13- 11/09/13   right breast 4256 cGy in 16 sessions, hypo-fractionated  . Hypothyroid   . Migraine   . Osteopenia   . Personal history of radiation therapy 2015  . PONV (postoperative nausea and vomiting)   . Sialoadenitis   . Spinal stenosis   . Urinary incontinence   . Vitiligo   . Wears glasses     Past Surgical History:  Procedure Laterality Date  . BREAST BIOPSY Right 07/2013  . BREAST LUMPECTOMY Right 08/2013  . BREAST LUMPECTOMY WITH RADIOACTIVE SEED  LOCALIZATION Right 08/30/2013   Procedure: RIGHT PARTIAL MASTECTOMY WITH RADIOACTIVE SEED LOCALIZATION;  Surgeon: [name], MD;  Location: [facility];  Service: General;  Laterality: Right;  . BREAST SURGERY    . CHOLECYSTECTOMY  1996   lap  . COLONOSCOPY  2007   per Dr. Cristina Gong, clear, repeat in 10 yrs  . DIAGNOSTIC LAPAROSCOPY  1982   exp  . LAPAROSCOPIC OVARIAN CYSTECTOMY    . URETHRAL DILATION      There were no vitals filed for this visit.   Subjective Assessment - 07/31/20 1137    Subjective Pt reports a restless night and woke up at 3am crying from the pain and needed to take pain medication. Nothing happened in order to irritate her at night. Other than that the arm has been feeling good.    Patient Stated Goals Get full use of right arm and recondition her body    Currently in Pain? Yes    Pain Score --   it's hard to rate it with being on the medication, but it's really irritated   Pain Location Shoulder    Pain Orientation Right    Pain Descriptors / Indicators Aching    Pain Type Surgical pain  [facility] PT Assessment - 07/31/20 0001      Palpation   Palpation comment bilateral suboccipital tension                         [facility] Adult PT Treatment/Exercise - 07/31/20 0001      Exercises   Exercises Shoulder      Shoulder Exercises: Seated   Other Seated Exercises L ulnar nerve glides x10      Manual Therapy   Manual Therapy Passive ROM;Soft tissue mobilization;Myofascial release    Manual therapy comments slow elevation into flexion and ER secondary to high irritability this session    Soft tissue mobilization Right bicep region    Myofascial Release bilateral suboccipital release with traction in supine    Passive ROM Right elbow extension, right shoulder elevation and ER in scapular plane                  PT Education - 07/31/20 1138    Education Details HEP update, suboccipital release     Person(s) Educated Patient    Methods Explanation    Comprehension Verbalized understanding;Need further instruction            PT Short Term Goals - 07/23/20 1338      PT SHORT TERM GOAL #1   Title Patient will be I with initial HEP to progress with PT    Time 4    Period Weeks    Status New    Target Date 08/20/20      PT SHORT TERM GOAL #2   Title Patient will demonstrate PROM >/= 120 deg elevation to reduce shoulder stiffness and progress allowable range    Time 4    Period Weeks    Status New    Target Date 08/20/20      PT SHORT TERM GOAL #3   Title Patient will report </= 5/10 pain with therapy to reduce limitation and allow progression within protocol    Time 4    Period Weeks    Status New    Target Date 08/20/20      PT SHORT TERM GOAL #4   Title PT will review FOTO with patient by 3rd visit    Time 3    Period Weeks    Status New    Target Date 08/13/20             PT Long Term Goals - 07/23/20 1323      PT LONG TERM GOAL #1   Title Patient will be I with final HEP to maintain progress from PT    Time 10    Period Weeks    Status New    Target Date 10/01/20      PT LONG TERM GOAL #2   Title Patient will report improved functional status >/= 55% on FOTO    Time 12    Period Weeks    Status New    Target Date 10/15/20      PT LONG TERM GOAL #3   Title Patient will demonstrate right shoulder AROM grossly WFL to return to swimming    Time 10    Period Weeks    Status New    Target Date 10/01/20      PT LONG TERM GOAL #4   Title Patient will demonstrate right shoulder rotator cuff strength >/= 4/5 MMT to be able to lift objects into top cabinets    Time 12  Period Weeks    Status New    Target Date 10/15/20                 Plan - 07/31/20 1224    Clinical Impression Statement Pt tolerated PT well with no adverse effects. PT today focused on slow PROM of the R shoulder and elbow secondary to high irritability and pain today. Pt  complained of cervicogenic type headaches today which decreased with suboccipital release with distraction and was shown how to replicate this at home. Pt would continue to benefit from skilled PT in order to progress shoulder PROM, decrease contralateral nerve sx's, progress postural endurance, and when cleared, shoulder strength and AROM in order to regain function of the R UE and return to swimming and painting.    PT Treatment/Interventions ADLs/Self Care Home Management;Aquatic Therapy;Cryotherapy;Electrical Stimulation;Iontophoresis 4mg /ml Dexamethasone;Moist Heat;Neuromuscular re-education;Therapeutic exercise;Therapeutic activities;Functional mobility training;Patient/family education;Manual techniques;Dry needling;Passive range of motion;Taping;Joint Manipulations    PT Next Visit Plan Review HEP and progress PRN, review ulnar nerve glides, continue PROM of R shoulder, further progress cervical retractions    PT Home Exercise Plan [id number]    Consulted and Agree with Plan of Care Patient           Patient will benefit from skilled therapeutic intervention in order to improve the following deficits and impairments:  Decreased range of motion,Postural dysfunction,Decreased strength,Pain,Decreased activity tolerance,Impaired UE functional use  Visit Diagnosis: Chronic right shoulder pain  Stiffness of right shoulder, not elsewhere classified  Muscle weakness (generalized)     Problem List Patient Active Problem List   Diagnosis Date Noted  . Genetic testing 10/02/2019  . Environmental and seasonal allergies 09/19/2019  . Family history of colon cancer   . Family history of melanoma   . Family history of ovarian cancer   . Family history of breast cancer   . Anxiety disorder 01/09/2019  . Cervical stenosis of spine 10/21/2018  . Insomnia 10/21/2018  . Breast tenderness in female 02/05/2014  . Pes planus of both feet 01/15/2014  . Low back pain 11/23/2013  . Phlebitis  10/06/2013  . History of depression 09/26/2013  . Breast cancer of upper-outer quadrant of right female breast ([place]) 08/18/2013  . ADHD (attention deficit hyperactivity disorder) 12/01/2011  . Headache 04/03/2009  . Hypothyroidism 05/05/2007  . GERD 05/05/2007  . Osteoarthritis 05/05/2007    Caleb Popp, SPT 07/31/2020, 12:58 PM  [facility] [address] [place], [place], [address] Phone: [phone]   Fax:  [phone]  Name: Kristina Pearson MRN: [id number] Date of Birth: [birth year]

## 2020-08-02 ENCOUNTER — Encounter: Payer: Self-pay | Admitting: Physical Therapy

## 2020-08-02 ENCOUNTER — Ambulatory Visit: Payer: 59 | Admitting: Physical Therapy

## 2020-08-02 ENCOUNTER — Other Ambulatory Visit: Payer: Self-pay

## 2020-08-02 DIAGNOSIS — M25511 Pain in right shoulder: Secondary | ICD-10-CM | POA: Diagnosis not present

## 2020-08-02 DIAGNOSIS — G8929 Other chronic pain: Secondary | ICD-10-CM

## 2020-08-02 DIAGNOSIS — M25611 Stiffness of right shoulder, not elsewhere classified: Secondary | ICD-10-CM

## 2020-08-02 DIAGNOSIS — M6281 Muscle weakness (generalized): Secondary | ICD-10-CM

## 2020-08-02 NOTE — Therapy (Signed)
[place], [place], [address] Phone: [phone]   Fax:  [phone]  Physical Therapy Treatment  Patient Details  Name: Kristina Pearson MRN: [id number] Date of Birth: 08-21-1955 Referring Provider (PT): Currence, Gasper Sells, [place]   Encounter Date: 08/02/2020   PT End of Session - 08/02/20 0858    Visit Number 5    Number of Visits 20    Date for PT Re-Evaluation 10/01/20    Authorization Type AETNA    Authorization Time Period FOTO by 6th visit    PT Start Time 0831    PT Stop Time 0905    PT Time Calculation (min) 34 min    Activity Tolerance Patient limited by pain;Other (comment)   and nausea   Behavior During Therapy WFL for tasks assessed/performed           Past Medical History:  Diagnosis Date  . ADHD   . ADHD (attention deficit hyperactivity disorder)    ADD  . Allergy   . Anxiety   . Arthritis    osteoarthritrs  . Breast cancer ([place]) 08/15/13   right lateral upper outer  . Depression   . Family history of breast cancer   . Family history of colon cancer   . Family history of melanoma   . Family history of ovarian cancer   . Fibromyalgia   . GERD (gastroesophageal reflux disease)    TUMS  . ZDGLOVFI(433.2)    sees Dr. Gwendel Hanson at [facility]   . History of depression   . Hx of radiation therapy 10/19/13- 11/09/13   right breast 4256 cGy in 16 sessions, hypo-fractionated  . Hypothyroid   . Migraine   . Osteopenia   . Personal history of radiation therapy 2015  . PONV (postoperative nausea and vomiting)   . Sialoadenitis   . Spinal stenosis   . Urinary incontinence   . Vitiligo   . Wears glasses     Past Surgical History:  Procedure Laterality Date  . BREAST BIOPSY Right 07/2013  . BREAST LUMPECTOMY Right 08/2013  . BREAST LUMPECTOMY WITH RADIOACTIVE SEED LOCALIZATION Right 08/30/2013   Procedure: RIGHT PARTIAL MASTECTOMY WITH RADIOACTIVE SEED LOCALIZATION;   Surgeon: [name], MD;  Location: [facility];  Service: General;  Laterality: Right;  . BREAST SURGERY    . CHOLECYSTECTOMY  1996   lap  . COLONOSCOPY  2007   per Dr. Cristina Gong, clear, repeat in 10 yrs  . DIAGNOSTIC LAPAROSCOPY  1982   exp  . LAPAROSCOPIC OVARIAN CYSTECTOMY    . URETHRAL DILATION      There were no vitals filed for this visit.   Subjective Assessment - 08/02/20 0834    Subjective Pt states that she barely slept last night and almost canceled today because the pain was so bad. She took her pain medication but she thinks she took it too late. She is feeling very nauseous from the pain. Notes pain in the L shoulder, she think it might be the joint. She has been doing her exercises.    Currently in Pain? Yes    Pain Score 5     Pain Location Shoulder    Pain Orientation Right    Pain Descriptors / Indicators Aching    Pain Type Surgical pain    Pain Onset More than a month ago    Pain Frequency Intermittent  [facility] Adult PT Treatment/Exercise - 08/02/20 0001      Shoulder Exercises: Seated   Retraction 10 reps   10 second holds   Other Seated Exercises L ulnar nerve glides x10      Shoulder Exercises: Standing   External Rotation 10 reps;Left    Theraband Level (Shoulder External Rotation) Level 1 (Yellow)      Manual Therapy   Manual Therapy Passive ROM;Myofascial release    Manual therapy comments slow elevation into flexion and ER secondary to high irritability this session                  PT Education - 08/02/20 0857    Education Details HEP review    Person(s) Educated Patient    Methods Explanation    Comprehension Verbalized understanding;Need further instruction            PT Short Term Goals - 07/23/20 1338      PT SHORT TERM GOAL #1   Title Patient will be I with initial HEP to progress with PT    Time 4    Period Weeks    Status New    Target Date 08/20/20       PT SHORT TERM GOAL #2   Title Patient will demonstrate PROM >/= 120 deg elevation to reduce shoulder stiffness and progress allowable range    Time 4    Period Weeks    Status New    Target Date 08/20/20      PT SHORT TERM GOAL #3   Title Patient will report </= 5/10 pain with therapy to reduce limitation and allow progression within protocol    Time 4    Period Weeks    Status New    Target Date 08/20/20      PT SHORT TERM GOAL #4   Title PT will review FOTO with patient by 3rd visit    Time 3    Period Weeks    Status New    Target Date 08/13/20             PT Long Term Goals - 07/23/20 1323      PT LONG TERM GOAL #1   Title Patient will be I with final HEP to maintain progress from PT    Time 10    Period Weeks    Status New    Target Date 10/01/20      PT LONG TERM GOAL #2   Title Patient will report improved functional status >/= 55% on FOTO    Time 12    Period Weeks    Status New    Target Date 10/15/20      PT LONG TERM GOAL #3   Title Patient will demonstrate right shoulder AROM grossly WFL to return to swimming    Time 10    Period Weeks    Status New    Target Date 10/01/20      PT LONG TERM GOAL #4   Title Patient will demonstrate right shoulder rotator cuff strength >/= 4/5 MMT to be able to lift objects into top cabinets    Time 12    Period Weeks    Status New    Target Date 10/15/20                 Plan - 08/02/20 0912    Clinical Impression Statement PT this session focused on very slow PROM elevation and ER of the R shoulder with increases in  both ranges due to very high irritability. Pt complained of pain in the L shoulder, possibly due to the strap of her brace, so rotator cuff exercises and nerve glide exercises were implemented on the L shoulder. Pt was in distress by the end of tx and needed to end the session early due to nausea/pain from last night. Pt continues to benefit from skilled PT in order to increase ROM, decrease  pain, and when cleared, increase strength as nausea/pain tolerance increases    PT Treatment/Interventions ADLs/Self Care Home Management;Aquatic Therapy;Cryotherapy;Electrical Stimulation;Iontophoresis 4mg /ml Dexamethasone;Moist Heat;Neuromuscular re-education;Therapeutic exercise;Therapeutic activities;Functional mobility training;Patient/family education;Manual techniques;Dry needling;Passive range of motion;Taping;Joint Manipulations    PT Next Visit Plan review HEP and progress PRN, continue PROM of R shoulder as tolerated, work on L shoulder strengthening secondary to pain from shoulder strap    PT Home Exercise Plan [id number]    Consulted and Agree with Plan of Care Patient           Patient will benefit from skilled therapeutic intervention in order to improve the following deficits and impairments:  Decreased range of motion,Postural dysfunction,Decreased strength,Pain,Decreased activity tolerance,Impaired UE functional use  Visit Diagnosis: Chronic right shoulder pain  Stiffness of right shoulder, not elsewhere classified  Muscle weakness (generalized)     Problem List Patient Active Problem List   Diagnosis Date Noted  . Genetic testing 10/02/2019  . Environmental and seasonal allergies 09/19/2019  . Family history of colon cancer   . Family history of melanoma   . Family history of ovarian cancer   . Family history of breast cancer   . Anxiety disorder 01/09/2019  . Cervical stenosis of spine 10/21/2018  . Insomnia 10/21/2018  . Breast tenderness in female 02/05/2014  . Pes planus of both feet 01/15/2014  . Low back pain 11/23/2013  . Phlebitis 10/06/2013  . History of depression 09/26/2013  . Breast cancer of upper-outer quadrant of right female breast ([place]) 08/18/2013  . ADHD (attention deficit hyperactivity disorder) 12/01/2011  . Headache 04/03/2009  . Hypothyroidism 05/05/2007  . GERD 05/05/2007  . Osteoarthritis 05/05/2007    Caleb Popp,  SPT 08/02/2020, 10:52 AM  [facility] [address] [place], [place], [address] Phone: [phone]   Fax:  [phone]  Name: LORALAI EISMAN MRN: [id number] Date of Birth: 1955-10-10

## 2020-08-06 ENCOUNTER — Encounter: Payer: Self-pay | Admitting: Physical Therapy

## 2020-08-06 ENCOUNTER — Ambulatory Visit: Payer: 59 | Admitting: Physical Therapy

## 2020-08-06 ENCOUNTER — Other Ambulatory Visit: Payer: Self-pay

## 2020-08-06 DIAGNOSIS — M25611 Stiffness of right shoulder, not elsewhere classified: Secondary | ICD-10-CM

## 2020-08-06 DIAGNOSIS — M25511 Pain in right shoulder: Secondary | ICD-10-CM | POA: Diagnosis not present

## 2020-08-06 DIAGNOSIS — G8929 Other chronic pain: Secondary | ICD-10-CM

## 2020-08-06 DIAGNOSIS — M6281 Muscle weakness (generalized): Secondary | ICD-10-CM

## 2020-08-06 NOTE — Therapy (Signed)
[place], [place], [address] Phone: [phone]   Fax:  [phone]  Physical Therapy Treatment  Patient Details  Name: Kristina Pearson MRN: [id number] Date of Birth: Sep 12, 1955 Referring Provider (PT): Currence, Gasper Sells, [place]   Encounter Date: 08/06/2020   PT End of Session - 08/06/20 1305    Visit Number 6    Number of Visits 20    Date for PT Re-Evaluation 10/01/20    Authorization Type AETNA    Authorization Time Period FOTO by 6th visit    PT Start Time 1218    PT Stop Time 1305    PT Time Calculation (min) 47 min    Activity Tolerance Patient tolerated treatment well    Behavior During Therapy [facility] for tasks assessed/performed           Past Medical History:  Diagnosis Date  . ADHD   . ADHD (attention deficit hyperactivity disorder)    ADD  . Allergy   . Anxiety   . Arthritis    osteoarthritrs  . Breast cancer ([place]) 08/15/13   right lateral upper outer  . Depression   . Family history of breast cancer   . Family history of colon cancer   . Family history of melanoma   . Family history of ovarian cancer   . Fibromyalgia   . GERD (gastroesophageal reflux disease)    TUMS  . UXNATFTD(322.0)    sees Dr. Gwendel Hanson at [facility]   . History of depression   . Hx of radiation therapy 10/19/13- 11/09/13   right breast 4256 cGy in 16 sessions, hypo-fractionated  . Hypothyroid   . Migraine   . Osteopenia   . Personal history of radiation therapy 2015  . PONV (postoperative nausea and vomiting)   . Sialoadenitis   . Spinal stenosis   . Urinary incontinence   . Vitiligo   . Wears glasses     Past Surgical History:  Procedure Laterality Date  . BREAST BIOPSY Right 07/2013  . BREAST LUMPECTOMY Right 08/2013  . BREAST LUMPECTOMY WITH RADIOACTIVE SEED LOCALIZATION Right 08/30/2013   Procedure: RIGHT PARTIAL MASTECTOMY WITH RADIOACTIVE SEED LOCALIZATION;  Surgeon: [name], MD;  Location: [place];  Service: General;  Laterality: Right;  . BREAST SURGERY    . CHOLECYSTECTOMY  1996   lap  . COLONOSCOPY  2007   per Dr. Cristina Gong, clear, repeat in 10 yrs  . DIAGNOSTIC LAPAROSCOPY  1982   exp  . LAPAROSCOPIC OVARIAN CYSTECTOMY    . URETHRAL DILATION      There were no vitals filed for this visit.   Subjective Assessment - 08/06/20 1323    Subjective I am feeling a lot better after last session and not as nauseous this time. I slept well tonight and was able to do my exercises over the weekend.    Currently in Pain? Yes    Pain Score --   not answered   Pain Orientation Right    Pain Descriptors / Indicators Aching                             [facility] Adult PT Treatment/Exercise - 08/06/20 0001      Self-Care   Self-Care Other Self-Care Comments    Other Self-Care Comments  education on the importance of rest to promote healing and how inflammation/repeated end range stress can have on  healing; education on evidence showing that research has not showed improved outcomes for 3 PT visits vs 2 PT visits per week with rotator cuff repairs; education on where she is expected to be at 4 weeks in terms of ROM and what is needed to move to the next phase of PT; education on tension/stress and what it can do to healing; education on not doing exercises when she has PT in order to help let rotator cuff rest      Shoulder Exercises: Seated   Retraction Other (comment);12 reps   10 second holds   Other Seated Exercises L ulnar nerve glides x10   cueing needed to not hold longer than 1-2 seconds at end range     Shoulder Exercises: ROM/Strengthening   Other ROM/Strengthening Exercises R PROM standing corner ER (rotating body while keeping shoulder relaxed) to around ~30 degrees x10      Manual Therapy   Manual Therapy Passive ROM    Manual therapy comments slow elevation into flexion and ER secondary to decreased ability to  relax    Passive ROM R PROM flexion to >90 degrees and ER to ~30 degrees                  PT Education - 08/06/20 1325    Education Details HEP update, education on need for graded rest to promote healing, education on evidence/prognosis    Person(s) Educated Patient    Methods Explanation    Comprehension Verbalized understanding;Need further instruction            PT Short Term Goals - 07/23/20 1338      PT SHORT TERM GOAL #1   Title Patient will be I with initial HEP to progress with PT    Time 4    Period Weeks    Status New    Target Date 08/20/20      PT SHORT TERM GOAL #2   Title Patient will demonstrate PROM >/= 120 deg elevation to reduce shoulder stiffness and progress allowable range    Time 4    Period Weeks    Status New    Target Date 08/20/20      PT SHORT TERM GOAL #3   Title Patient will report </= 5/10 pain with therapy to reduce limitation and allow progression within protocol    Time 4    Period Weeks    Status New    Target Date 08/20/20      PT SHORT TERM GOAL #4   Title PT will review FOTO with patient by 3rd visit    Time 3    Period Weeks    Status New    Target Date 08/13/20             PT Long Term Goals - 07/23/20 1323      PT LONG TERM GOAL #1   Title Patient will be I with final HEP to maintain progress from PT    Time 10    Period Weeks    Status New    Target Date 10/01/20      PT LONG TERM GOAL #2   Title Patient will report improved functional status >/= 55% on FOTO    Time 12    Period Weeks    Status New    Target Date 10/15/20      PT LONG TERM GOAL #3   Title Patient will demonstrate right shoulder AROM grossly WFL to return to swimming  Time 10    Period Weeks    Status New    Target Date 10/01/20      PT LONG TERM GOAL #4   Title Patient will demonstrate right shoulder rotator cuff strength >/= 4/5 MMT to be able to lift objects into top cabinets    Time 12    Period Weeks    Status New     Target Date 10/15/20                 Plan - 08/06/20 1328    Clinical Impression Statement Pt tolerated PT well this session with no adverse effects. Pts pain was a lot more manageable this session and she was more relaxed. Pt has maintained PROM expectations for s/p 4 weeks rotator cuff surgery since she has met > 90 degrees of PROM flexion and ~30 degrees PROM ER. Pt extensively educated this session on inflammation and it's role in healing, reassured that she is where she should be at in terms of ROM goals for s/p 4 weeks, and what she should expect as she progresses through rehab. Pt told to talk with her doctor about dropping down to 2x/week for PT to help manage inflammation and allow for recovery time between sessions. Pt will continue to benefit from skilled PT in order to help further educate on healing s/p surgery and rotator cuff protocol, increase R UE ROM, and when appropriate, start to strengthen R UE to help decrease pain and increase functional use of R UE.    PT Treatment/Interventions ADLs/Self Care Home Management;Aquatic Therapy;Cryotherapy;Electrical Stimulation;Iontophoresis 42m/ml Dexamethasone;Moist Heat;Neuromuscular re-education;Therapeutic exercise;Therapeutic activities;Functional mobility training;Patient/family education;Manual techniques;Dry needling;Passive range of motion;Taping;Joint Manipulations    PT Next Visit Plan do FOTO,    POcean [id number]   Consulted and Agree with Plan of Care Patient           Patient will benefit from skilled therapeutic intervention in order to improve the following deficits and impairments:  Decreased range of motion,Postural dysfunction,Decreased strength,Pain,Decreased activity tolerance,Impaired UE functional use  Visit Diagnosis: Chronic right shoulder pain  Stiffness of right shoulder, not elsewhere classified  Muscle weakness (generalized)     Problem List Patient Active Problem List   Diagnosis  Date Noted  . Genetic testing 10/02/2019  . Environmental and seasonal allergies 09/19/2019  . Family history of colon cancer   . Family history of melanoma   . Family history of ovarian cancer   . Family history of breast cancer   . Anxiety disorder 01/09/2019  . Cervical stenosis of spine 10/21/2018  . Insomnia 10/21/2018  . Breast tenderness in female 02/05/2014  . Pes planus of both feet 01/15/2014  . Low back pain 11/23/2013  . Phlebitis 10/06/2013  . History of depression 09/26/2013  . Breast cancer of upper-outer quadrant of right [place] breast ([place] 08/18/2013  . ADHD (attention deficit hyperactivity disorder) 12/01/2011  . Headache 04/03/2009  . Hypothyroidism 05/05/2007  . GERD 05/05/2007  . Osteoarthritis 05/05/2007    CCaleb Popp2/15/2022, 1:45 PM  [facility] [place] [address]Phone: [phone]  Fax:  [phone] Name: ASHERENE PLANCARTEMRN: [id number] of Birth: 410/23/1957

## 2020-08-06 NOTE — Patient Instructions (Signed)
Access Code: [email or web address] URL: [email or web address] Date: 08/06/2020 Prepared by: Hilda Blades  Exercises Supine Shoulder External Rotation AAROM with Dowel - 2 x daily - 7 x weekly - 10 reps - 10 seconds hold Standing Shoulder External Rotation Stretch in Doorway - 2 x daily - 7 x weekly - 10 reps - 10 seconds hold Seated Shoulder Flexion Towel Slide at Table Top - 2 x daily - 7 x weekly - 10 reps - 10 seconds hold Seated Scapular Retraction - 2 x daily - 7 x weekly - 10 reps - 5 seconds hold Seated Cervical Retraction - 2-3 x daily - 7 x weekly - 10 reps - 5 seconds hold Ulnar Nerve Flossing - 2-3 x daily - 7 x weekly - 10 reps Supine Suboccipital Release with Tennis Balls

## 2020-08-08 ENCOUNTER — Ambulatory Visit: Payer: 59 | Admitting: Physical Therapy

## 2020-08-08 ENCOUNTER — Other Ambulatory Visit: Payer: Self-pay

## 2020-08-08 ENCOUNTER — Encounter: Payer: Self-pay | Admitting: Physical Therapy

## 2020-08-08 DIAGNOSIS — M25511 Pain in right shoulder: Secondary | ICD-10-CM | POA: Diagnosis not present

## 2020-08-08 DIAGNOSIS — G8929 Other chronic pain: Secondary | ICD-10-CM

## 2020-08-08 DIAGNOSIS — M6281 Muscle weakness (generalized): Secondary | ICD-10-CM

## 2020-08-08 DIAGNOSIS — M25611 Stiffness of right shoulder, not elsewhere classified: Secondary | ICD-10-CM

## 2020-08-08 NOTE — Therapy (Addendum)
[place], [place], [address] Phone: [phone]   Fax:  [phone]  Physical Therapy Treatment  Patient Details  Name: Kristina Pearson MRN: [id number] Date of Birth: 06/20/1956 Referring Provider (PT): Currence, Gasper Sells, [place]   Encounter Date: 08/08/2020   PT End of Session - 08/08/20 1301    Visit Number 7    Number of Visits 20    Date for PT Re-Evaluation 10/01/20    Authorization Type AETNA    Authorization Time Period FOTO by 10th visit    PT Start Time 1218    PT Stop Time 1300    PT Time Calculation (min) 42 min    Activity Tolerance Patient tolerated treatment well    Behavior During Therapy [facility] for tasks assessed/performed           Past Medical History:  Diagnosis Date  . ADHD   . ADHD (attention deficit hyperactivity disorder)    ADD  . Allergy   . Anxiety   . Arthritis    osteoarthritrs  . Breast cancer ([place]) 08/15/13   right lateral upper outer  . Depression   . Family history of breast cancer   . Family history of colon cancer   . Family history of melanoma   . Family history of ovarian cancer   . Fibromyalgia   . GERD (gastroesophageal reflux disease)    TUMS  . MHDQQIWL(798.9)    sees Dr. Gwendel Hanson at [facility]   . History of depression   . Hx of radiation therapy 10/19/13- 11/09/13   right breast 4256 cGy in 16 sessions, hypo-fractionated  . Hypothyroid   . Migraine   . Osteopenia   . Personal history of radiation therapy 2015  . PONV (postoperative nausea and vomiting)   . Sialoadenitis   . Spinal stenosis   . Urinary incontinence   . Vitiligo   . Wears glasses     Past Surgical History:  Procedure Laterality Date  . BREAST BIOPSY Right 07/2013  . BREAST LUMPECTOMY Right 08/2013  . BREAST LUMPECTOMY WITH RADIOACTIVE SEED LOCALIZATION Right 08/30/2013   Procedure: RIGHT PARTIAL MASTECTOMY WITH RADIOACTIVE SEED LOCALIZATION;  Surgeon: [name], MD;  Location: [place];  Service: General;  Laterality: Right;  . BREAST SURGERY    . CHOLECYSTECTOMY  1996   lap  . COLONOSCOPY  2007   per Dr. Cristina Gong, clear, repeat in 10 yrs  . DIAGNOSTIC LAPAROSCOPY  1982   exp  . LAPAROSCOPIC OVARIAN CYSTECTOMY    . URETHRAL DILATION      There were no vitals filed for this visit.   Subjective Assessment - 08/08/20 1219    Subjective PA agrees on going down to 2x/week, especially since she is going to need breaks during the week in the next stages of rehab to let the shoulder recover. PA said that it is up to PT on if she can do water aerobics currently. The doctor also said that she can start to wean out of the sling slowly. I didn't sleep well last night.    Currently in Pain? Yes    Pain Score 5     Pain Location Shoulder    Pain Orientation Right    Pain Descriptors / Indicators Aching    Pain Type Surgical pain    Pain Onset More than a month ago    Pain Frequency Intermittent  [facility] PT Assessment - 08/08/20 0001      Observation/Other Assessments   Focus on Therapeutic Outcomes (FOTO)  27% functional status                         [facility] Adult PT Treatment/Exercise - 08/08/20 0001      Self-Care   Other Self-Care Comments  education on ability to perform LE exercises at the gym as well as water aerobics, but only to waist height      Neck Exercises: Standing   Neck Retraction 10 reps;10 secs      Shoulder Exercises: Seated   Other Seated Exercises forward table slides, sitting in chair at edge of table, hold 5 seconds at the end x10    Other Seated Exercises L ulnar nerve glides x10      Shoulder Exercises: ROM/Strengthening   Other ROM/Strengthening Exercises R PROM standing corner ER (rotating body while keeping shoulder relaxed) to around ~30 degrees x10, cued to not go into pain      Manual Therapy   Manual Therapy Passive ROM    Manual therapy comments slow  elevation into flexion and ER    Soft tissue mobilization Right long head of bicep    Passive ROM R PROM flexion to >90 degrees and ER to ~30 degrees                  PT Education - 08/08/20 1224    Education Details HEP    Person(s) Educated Patient    Methods Explanation    Comprehension Verbalized understanding;Need further instruction            PT Short Term Goals - 08/08/20 1322      PT SHORT TERM GOAL #1   Title Patient will be I with initial HEP to progress with PT    Time 4    Period Weeks    Status New    Target Date 08/20/20      PT SHORT TERM GOAL #2   Title Patient will demonstrate PROM >/= 120 deg elevation to reduce shoulder stiffness and progress allowable range    Time 4    Period Weeks    Status New    Target Date 08/20/20      PT SHORT TERM GOAL #3   Title Patient will report </= 5/10 pain with therapy to reduce limitation and allow progression within protocol    Time 4    Period Weeks    Status New    Target Date 08/20/20      PT SHORT TERM GOAL #4   Title PT will review FOTO with patient by 3rd visit    Time 3    Period Weeks    Status Achieved    Target Date 08/13/20             PT Long Term Goals - 07/23/20 1323      PT LONG TERM GOAL #1   Title Patient will be I with final HEP to maintain progress from PT    Time 10    Period Weeks    Status New    Target Date 10/01/20      PT LONG TERM GOAL #2   Title Patient will report improved functional status >/= 55% on FOTO    Time 12    Period Weeks    Status New    Target Date 10/15/20      PT LONG  TERM GOAL #3   Title Patient will demonstrate right shoulder AROM grossly WFL to return to swimming    Time 10    Period Weeks    Status New    Target Date 10/01/20      PT LONG TERM GOAL #4   Title Patient will demonstrate right shoulder rotator cuff strength >/= 4/5 MMT to be able to lift objects into top cabinets    Time 12    Period Weeks    Status New    Target Date  10/15/20                 Plan - 08/08/20 1306    Clinical Impression Statement Pt tolerated PT well with no adverse effects. Pt was agreeable to go down to 2x/week for PT, especially since PA agreed that she needs time to rest in between sessions. Further education was still given on decreasing frequency of exercises if she is getting irritated, since she stated she was doing exercises 3x/day. Pt responded well to manual therapy and PROM this session, with even better tolerance to when pt performed PROM herself with forward table slides and PROM ER at the corner. Pt will continue to benefit from skilled PT in order to increase ROM, postural strength, decrease L radicular sx, and when appropriate, start to strengthen R UE to help decrease pain and increase functional use of R UE.    PT Treatment/Interventions ADLs/Self Care Home Management;Aquatic Therapy;Cryotherapy;Electrical Stimulation;Iontophoresis 4mg /ml Dexamethasone;Moist Heat;Neuromuscular re-education;Therapeutic exercise;Therapeutic activities;Functional mobility training;Patient/family education;Manual techniques;Dry needling;Passive range of motion;Taping;Joint Manipulations    PT Next Visit Plan increase ROM, manual PRN, postural strength as appropriate, and further educate on healing process    PT Home Exercise Plan [email or web address]    Consulted and Agree with Plan of Care Patient           Patient will benefit from skilled therapeutic intervention in order to improve the following deficits and impairments:  Decreased range of motion,Postural dysfunction,Decreased strength,Pain,Decreased activity tolerance,Impaired UE functional use  Visit Diagnosis: Chronic right shoulder pain  Stiffness of right shoulder, not elsewhere classified  Muscle weakness (generalized)     Problem List Patient Active Problem List   Diagnosis Date Noted  . Genetic testing 10/02/2019  . Environmental and seasonal allergies 09/19/2019  . Family  history of colon cancer   . Family history of melanoma   . Family history of ovarian cancer   . Family history of breast cancer   . Anxiety disorder 01/09/2019  . Cervical stenosis of spine 10/21/2018  . Insomnia 10/21/2018  . Breast tenderness in female 02/05/2014  . Pes planus of both feet 01/15/2014  . Low back pain 11/23/2013  . Phlebitis 10/06/2013  . History of depression 09/26/2013  . Breast cancer of upper-outer quadrant of right female breast ([place]) 08/18/2013  . ADHD (attention deficit hyperactivity disorder) 12/01/2011  . Headache 04/03/2009  . Hypothyroidism 05/05/2007  . GERD 05/05/2007  . Osteoarthritis 05/05/2007    Caleb Popp 08/08/2020, 1:24 PM  [facility] [address] [place], [place], [address] Phone: [phone]   Fax:  [phone]  Name: Kristina Pearson MRN: [id number] Date of Birth: 1955/11/13

## 2020-08-12 ENCOUNTER — Encounter: Payer: Self-pay | Admitting: Physical Therapy

## 2020-08-12 ENCOUNTER — Ambulatory Visit: Payer: 59 | Admitting: Physical Therapy

## 2020-08-12 ENCOUNTER — Other Ambulatory Visit: Payer: Self-pay

## 2020-08-12 DIAGNOSIS — M6281 Muscle weakness (generalized): Secondary | ICD-10-CM

## 2020-08-12 DIAGNOSIS — M25511 Pain in right shoulder: Secondary | ICD-10-CM | POA: Diagnosis not present

## 2020-08-12 DIAGNOSIS — M25611 Stiffness of right shoulder, not elsewhere classified: Secondary | ICD-10-CM

## 2020-08-12 DIAGNOSIS — G8929 Other chronic pain: Secondary | ICD-10-CM

## 2020-08-12 NOTE — Patient Instructions (Signed)
Access Code: [email or web address] URL: [email or web address] Date: 08/12/2020 Prepared by: Hilda Blades  Exercises Supine Shoulder External Rotation AAROM with Dowel - 2 x daily - 7 x weekly - 10 reps - 10 seconds hold Standing Shoulder External Rotation Stretch in Doorway - 2 x daily - 7 x weekly - 10 reps - 10 seconds hold Seated Shoulder Flexion Towel Slide at Table Top - 2 x daily - 7 x weekly - 10 reps - 10 seconds hold Seated Scapular Retraction - 2 x daily - 7 x weekly - 10 reps - 5 seconds hold Seated Cervical Retraction - 2-3 x daily - 7 x weekly - 10 reps - 5 seconds hold Ulnar Nerve Flossing - 2-3 x daily - 7 x weekly - 10 reps Supine Suboccipital Release with Tennis Balls Standing 'L' Stretch at [employer] - 2 x daily - 7 x weekly - 10 reps - 10 seconds hold

## 2020-08-12 NOTE — Therapy (Signed)
[place], [place], [address] Phone: [phone]   Fax:  [phone]  Physical Therapy Treatment  Patient Details  Name: Kristina Pearson MRN: [id number] Date of Birth: 08-02-1955 Referring Provider (PT): Currence, Gasper Sells, [place]   Encounter Date: 08/12/2020   PT End of Session - 08/12/20 1506    Visit Number 8    Number of Visits 20    Date for PT Re-Evaluation 10/01/20    Authorization Type AETNA    Authorization Time Period FOTO by 10th visit    PT Start Time 1606    PT Stop Time 1652    PT Time Calculation (min) 46 min    Activity Tolerance Patient tolerated treatment well    Behavior During Therapy [facility] for tasks assessed/performed           Past Medical History:  Diagnosis Date  . ADHD   . ADHD (attention deficit hyperactivity disorder)    ADD  . Allergy   . Anxiety   . Arthritis    osteoarthritrs  . Breast cancer ([place]) 08/15/13   right lateral upper outer  . Depression   . Family history of breast cancer   . Family history of colon cancer   . Family history of melanoma   . Family history of ovarian cancer   . Fibromyalgia   . GERD (gastroesophageal reflux disease)    TUMS  . LOVFIEPP(295.1)    sees Dr. Gwendel Hanson at [facility]   . History of depression   . Hx of radiation therapy 10/19/13- 11/09/13   right breast 4256 cGy in 16 sessions, hypo-fractionated  . Hypothyroid   . Migraine   . Osteopenia   . Personal history of radiation therapy 2015  . PONV (postoperative nausea and vomiting)   . Sialoadenitis   . Spinal stenosis   . Urinary incontinence   . Vitiligo   . Wears glasses     Past Surgical History:  Procedure Laterality Date  . BREAST BIOPSY Right 07/2013  . BREAST LUMPECTOMY Right 08/2013  . BREAST LUMPECTOMY WITH RADIOACTIVE SEED LOCALIZATION Right 08/30/2013   Procedure: RIGHT PARTIAL MASTECTOMY WITH RADIOACTIVE SEED LOCALIZATION;  Surgeon: [name], MD;  Location: [facility];  Service: General;  Laterality: Right;  . BREAST SURGERY    . CHOLECYSTECTOMY  1996   lap  . COLONOSCOPY  2007   per Dr. Cristina Gong, clear, repeat in 10 yrs  . DIAGNOSTIC LAPAROSCOPY  1982   exp  . LAPAROSCOPIC OVARIAN CYSTECTOMY    . URETHRAL DILATION      There were no vitals filed for this visit.   Subjective Assessment - 08/12/20 1508    Subjective Pt states she has had her sling off for a max of 2.5 hours since last visit with good outcome. She has switched from the supine stick ER PROM to standing corner PROM and thinks that she is getting further and able to relax more. She is complaining about L shoulder pain that is near the L acromian, pecs, and deltoid that sometimes can get worse than the R shoulder (sometimes gets to 6/10 and keeps her up at night until she relaxes or takes pain medication).    Currently in Pain? Yes    Pain Score 3     Pain Location Shoulder    Pain Orientation Right    Pain Descriptors / Indicators Aching    Pain Type Surgical pain  Pain Onset More than a month ago    Pain Frequency Intermittent                             [facility] Adult PT Treatment/Exercise - 08/12/20 0001      Shoulder Exercises: Seated   Other Seated Exercises forward table slides, sitting in chair at edge of table, hold 5 seconds at the end x10      Shoulder Exercises: Standing   External Rotation 15 reps;Left    Theraband Level (Shoulder External Rotation) Level 1 (Yellow)    Internal Rotation Left;15 reps    Theraband Level (Shoulder Internal Rotation) Level 1 (Yellow)    ABduction 10 reps    Shoulder ABduction Weight (lbs) 1    ABduction Limitations pain noted in joint at 90 degrees    Extension Left;10 reps    Theraband Level (Shoulder Extension) Level 2 (Red)    Row 15 reps;Left    Theraband Level (Shoulder Row) Level 1 (Yellow)    Row Limitations --    Other Standing Exercises L scaption to 90, 2#  x10    Other Standing Exercises --      Shoulder Exercises: ROM/Strengthening   Other ROM/Strengthening Exercises R PROM standing corner ER in scaption (rotating body while keeping shoulder relaxed) to around ~40 degrees x10    Other ROM/Strengthening Exercises standing PROM forward bow at counter bilateral PROm at shoulder x8      Manual Therapy   Manual Therapy Passive ROM    Passive ROM R PROMslow elevation into flexion (~100 degrees) and ER (~40 degrees)      Prosthetics   Prosthetic Care Comments  c                  PT Education - 08/12/20 1558    Education Details HEP update    Person(s) Educated Patient    Methods Explanation;Demonstration    Comprehension Verbalized understanding;Need further instruction            PT Short Term Goals - 08/08/20 1322      PT SHORT TERM GOAL #1   Title Patient will be I with initial HEP to progress with PT    Time 4    Period Weeks    Status New    Target Date 08/20/20      PT SHORT TERM GOAL #2   Title Patient will demonstrate PROM >/= 120 deg elevation to reduce shoulder stiffness and progress allowable range    Time 4    Period Weeks    Status New    Target Date 08/20/20      PT SHORT TERM GOAL #3   Title Patient will report </= 5/10 pain with therapy to reduce limitation and allow progression within protocol    Time 4    Period Weeks    Status New    Target Date 08/20/20      PT SHORT TERM GOAL #4   Title PT will review FOTO with patient by 3rd visit    Time 3    Period Weeks    Status Achieved    Target Date 08/13/20             PT Long Term Goals - 07/23/20 1323      PT LONG TERM GOAL #1   Title Patient will be I with final HEP to maintain progress from PT    Time 10  Period Weeks    Status New    Target Date 10/01/20      PT LONG TERM GOAL #2   Title Patient will report improved functional status >/= 55% on FOTO    Time 12    Period Weeks    Status New    Target Date 10/15/20      PT  LONG TERM GOAL #3   Title Patient will demonstrate right shoulder AROM grossly WFL to return to swimming    Time 10    Period Weeks    Status New    Target Date 10/01/20      PT LONG TERM GOAL #4   Title Patient will demonstrate right shoulder rotator cuff strength >/= 4/5 MMT to be able to lift objects into top cabinets    Time 12    Period Weeks    Status New    Target Date 10/15/20                 Plan - 08/12/20 1526    Clinical Impression Statement Pt tolerated tx well with no adverse effects. Pt getting past 90 degree PROM and about 40 degrees ER PROM therapist assisted in supine as well as with body guided PROM. Pt seems to tolerate therapist PROM more this session, as she is able to not resist PROM this session. PT implemented L shoulder strengthening this session in order to help with L shoulder pain from overreliance on L UE s/p R rotator cuff surgery, L shoulder sling, and to overall help with postural endurance. Pt will continue to benefit from skilled therapy in order to increase PROM, postural endurance, and when appropriate, slowly start to activate L rotator cuff musculature strengthening to help decrease pain and increase R UE functional usage.    PT Treatment/Interventions ADLs/Self Care Home Management;Aquatic Therapy;Cryotherapy;Electrical Stimulation;Iontophoresis 4mg /ml Dexamethasone;Moist Heat;Neuromuscular re-education;Therapeutic exercise;Therapeutic activities;Functional mobility training;Patient/family education;Manual techniques;Dry needling;Passive range of motion;Taping;Joint Manipulations    PT Next Visit Plan R arm self-assisted PROM to L arm, progress L arm strengthening/postural endurance, dowel presses bilat    PT Home Exercise Plan [id number]: forward bows    Consulted and Agree with Plan of Care Patient           Patient will benefit from skilled therapeutic intervention in order to improve the following deficits and impairments:  Decreased range  of motion,Postural dysfunction,Decreased strength,Pain,Decreased activity tolerance,Impaired UE functional use  Visit Diagnosis: Chronic right shoulder pain  Stiffness of right shoulder, not elsewhere classified  Muscle weakness (generalized)     Problem List Patient Active Problem List   Diagnosis Date Noted  . Genetic testing 10/02/2019  . Environmental and seasonal allergies 09/19/2019  . Family history of colon cancer   . Family history of melanoma   . Family history of ovarian cancer   . Family history of breast cancer   . Anxiety disorder 01/09/2019  . Cervical stenosis of spine 10/21/2018  . Insomnia 10/21/2018  . Breast tenderness in female 02/05/2014  . Pes planus of both feet 01/15/2014  . Low back pain 11/23/2013  . Phlebitis 10/06/2013  . History of depression 09/26/2013  . Breast cancer of upper-outer quadrant of right female breast ([place]) 08/18/2013  . ADHD (attention deficit hyperactivity disorder) 12/01/2011  . Headache 04/03/2009  . Hypothyroidism 05/05/2007  . GERD 05/05/2007  . Osteoarthritis 05/05/2007    Caleb Popp 08/12/2020, 4:06 PM  [facility] [address] [place], [place], [address] Phone: [phone]  Fax:  [phone]  Name: KAYSA ROULHAC MRN: [id number] Date of Birth: May 17, 1956

## 2020-08-13 NOTE — Progress Notes (Signed)
error 

## 2020-08-14 ENCOUNTER — Ambulatory Visit: Payer: 59 | Admitting: Physical Therapy

## 2020-08-15 ENCOUNTER — Other Ambulatory Visit: Payer: Self-pay | Admitting: Family Medicine

## 2020-08-16 ENCOUNTER — Other Ambulatory Visit: Payer: Self-pay

## 2020-08-16 ENCOUNTER — Encounter: Payer: Self-pay | Admitting: Physical Therapy

## 2020-08-16 ENCOUNTER — Ambulatory Visit: Payer: 59 | Admitting: Physical Therapy

## 2020-08-16 DIAGNOSIS — M25511 Pain in right shoulder: Secondary | ICD-10-CM | POA: Diagnosis not present

## 2020-08-16 DIAGNOSIS — M25611 Stiffness of right shoulder, not elsewhere classified: Secondary | ICD-10-CM

## 2020-08-16 DIAGNOSIS — M6281 Muscle weakness (generalized): Secondary | ICD-10-CM

## 2020-08-16 DIAGNOSIS — G8929 Other chronic pain: Secondary | ICD-10-CM

## 2020-08-16 NOTE — Patient Instructions (Signed)
Access Code: [email or web address] URL: [email or web address] Date: 08/16/2020 Prepared by: Hilda Blades  Exercises Supine Shoulder Press with Dowel - 2 x daily - 7 x weekly - 2 sets - 10 reps Supine Shoulder External Rotation AAROM with Dowel - 2 x daily - 7 x weekly - 10 reps - 2 sets Supine Suboccipital Release with Tennis Balls Seated Shoulder Flexion Towel Slide at Table Top - 2 x daily - 7 x weekly - 10 reps - 10 seconds hold Seated Shoulder External Rotation with Dowel - 2 x daily - 7 x weekly - 2 sets - 10 reps Seated Scapular Retraction - 2 x daily - 7 x weekly - 10 reps - 5 seconds hold Seated Cervical Retraction - 2-3 x daily - 7 x weekly - 10 reps - 5 seconds hold Ulnar Nerve Flossing - 2-3 x daily - 7 x weekly - 10 reps Standing Shoulder External Rotation Stretch in Doorway - 2 x daily - 7 x weekly - 10 reps - 10 seconds hold Standing 'L' Stretch at [employer] - 2 x daily - 7 x weekly - 10 reps - 10 seconds hold

## 2020-08-16 NOTE — Therapy (Addendum)
[place], [place], [address] Phone: [phone]   Fax:  [phone]  Physical Therapy Treatment  Patient Details  Name: Kristina Pearson MRN: [id number] Date of Birth: April 01, 1956 Referring Provider (PT): Sharyon Medicus, [place]   Encounter Date: 08/16/2020   PT End of Session - 08/16/20 1106     Visit Number 9    Number of Visits 20    Date for PT Re-Evaluation 10/01/20    Authorization Type AETNA    Authorization Time Period FOTO by 10th visit    PT Start Time 1050    PT Stop Time 1133    PT Time Calculation (min) 43 min    Activity Tolerance Patient tolerated treatment well    Behavior During Therapy [facility] for tasks assessed/performed             Past Medical History:  Diagnosis Date   ADHD    ADHD (attention deficit hyperactivity disorder)    ADD   Allergy    Anxiety    Arthritis    osteoarthritrs   Breast cancer ([place]) 08/15/13   right lateral upper outer   Depression    Family history of breast cancer    Family history of colon cancer    Family history of melanoma    Family history of ovarian cancer    Fibromyalgia    GERD (gastroesophageal reflux disease)    TUMS   Headache(784.0)    sees Dr. Gwendel Hanson at [facility]    History of depression    Hx of radiation therapy 10/19/13- 11/09/13   right breast 4256 cGy in 16 sessions, hypo-fractionated   Hypothyroid    Migraine    Osteopenia    Personal history of radiation therapy 2015   PONV (postoperative nausea and vomiting)    Sialoadenitis    Spinal stenosis    Urinary incontinence    Vitiligo    Wears glasses     Past Surgical History:  Procedure Laterality Date   BREAST BIOPSY Right 07/2013   BREAST LUMPECTOMY Right 08/2013   BREAST LUMPECTOMY WITH RADIOACTIVE SEED LOCALIZATION Right 08/30/2013   Procedure: RIGHT PARTIAL MASTECTOMY WITH RADIOACTIVE SEED LOCALIZATION;  Surgeon: [name], MD;  Location:  [place];  Service: General;  Laterality: Right;   [place]   lap   COLONOSCOPY  2007   per Dr. Cristina Gong, clear, repeat in 10 yrs   [place]      There were no vitals filed for this visit.   Subjective Assessment - 08/16/20 1054     Subjective Pt states she didn't do much yesterday with her exercises, she had a migraine. Wednesday her shoulder was really irritated so she didn't do her exercises.    Currently in Pain? Yes    Pain Score 5     Pain Location Shoulder    Pain Orientation Right    Pain Descriptors / Indicators Aching    Pain Type Surgical pain                                               [facility] Adult PT Treatment/Exercise - 08/16/20 0001       Shoulder  Exercises: Supine   Other Supine Exercises AAROM 2x10 ER with dowel    Other Supine Exercises AAROM dowel press up 2x10      Shoulder Exercises: Seated   External Rotation 10 reps    External Rotation Limitations AAROM with dowel    Other Seated Exercises forward table slides, sitting in chair at edge of table, hold 5 seconds at the end x10      Shoulder Exercises: Standing   External Rotation 15 reps    Theraband Level (Shoulder External Rotation) Level 1 (Yellow)    Internal Rotation Left;15 reps    Theraband Level (Shoulder Internal Rotation) Level 1 (Yellow)    Other Standing Exercises L scaption to 90, 1# x15      Manual Therapy   Manual Therapy Passive ROM    Manual therapy comments slow elevation into flexion and ER    Passive ROM R PROM (120~) and ER (more limited this session)                             PT Short Term Goals - 08/08/20 1322       PT SHORT TERM GOAL #1   Title Patient will be I with initial HEP to progress with PT    Time 4    Period Weeks    Status New    Target Date 08/20/20      PT SHORT TERM GOAL #2   Title  Patient will demonstrate PROM >/= 120 deg elevation to reduce shoulder stiffness and progress allowable range    Time 4    Period Weeks    Status New    Target Date 08/20/20      PT SHORT TERM GOAL #3   Title Patient will report </= 5/10 pain with therapy to reduce limitation and allow progression within protocol    Time 4    Period Weeks    Status New    Target Date 08/20/20      PT SHORT TERM GOAL #4   Title PT will review FOTO with patient by 3rd visit    Time 3    Period Weeks    Status Achieved    Target Date 08/13/20                PT Long Term Goals - 07/23/20 1323       PT LONG TERM GOAL #1   Title Patient will be I with final HEP to maintain progress from PT    Time 10    Period Weeks    Status New    Target Date 10/01/20      PT LONG TERM GOAL #2   Title Patient will report improved functional status >/= 55% on FOTO    Time 12    Period Weeks    Status New    Target Date 10/15/20      PT LONG TERM GOAL #3   Title Patient will demonstrate right shoulder AROM grossly WFL to return to swimming    Time 10    Period Weeks    Status New    Target Date 10/01/20      PT LONG TERM GOAL #4   Title Patient will demonstrate right shoulder rotator cuff strength >/= 4/5 MMT to be able to lift objects into top cabinets    Time 12    Period Weeks    Status New    Target Date  10/15/20                        Plan - 08/16/20 1118     Clinical Impression Statement Pt tolerated tx well with no adverse effects. AAROM shoulder presses and ER both supine and sitting was introduced this session with good tolerance and no increase in pain. PROM of shoulder flexion was increased this session to above 100 degrees, but ER PROM was a little limited due to pain. L sided strengthening exercises were done this session to help with posture as well as L shoulder pain. Pt continues to benefit from skilled PT in order to increase PROM and AAROM, postural endurance, and  further increase activation of the L rotator cuff musculature in order to decrease pain and return to R UE functional usage.    PT Treatment/Interventions ADLs/Self Care Home Management;Aquatic Therapy;Cryotherapy;Electrical Stimulation;Iontophoresis 4mg /ml Dexamethasone;Moist Heat;Neuromuscular re-education;Therapeutic exercise;Therapeutic activities;Functional mobility training;Patient/family education;Manual techniques;Dry needling;Passive range of motion;Taping;Joint Manipulations    PT Next Visit Plan FOTO next visit, R arm AAROM progression PRN. postural endurance exercises/L shoulder strengthening exercises PRN    PT Home Exercise Plan [email or web address]    Consulted and Agree with Plan of Care Patient             Patient will benefit from skilled therapeutic intervention in order to improve the following deficits and impairments:  Decreased range of motion,Postural dysfunction,Decreased strength,Pain,Decreased activity tolerance,Impaired UE functional use  Visit Diagnosis: Chronic right shoulder pain  Stiffness of right shoulder, not elsewhere classified  Muscle weakness (generalized)      Problem List Patient Active Problem List   Diagnosis Date Noted   Genetic testing 10/02/2019   Environmental and seasonal allergies 09/19/2019   Family history of colon cancer    Family history of melanoma    Family history of ovarian cancer    Family history of breast cancer    Anxiety disorder 01/09/2019   Cervical stenosis of spine 10/21/2018   Insomnia 10/21/2018   Breast tenderness in female 02/05/2014   Pes planus of both feet 01/15/2014   Low back pain 11/23/2013   Phlebitis 10/06/2013   History of depression 09/26/2013   Breast cancer of upper-outer quadrant of right female breast ([place]) 08/18/2013   ADHD (attention deficit hyperactivity disorder) 12/01/2011   Headache 04/03/2009   Hypothyroidism 05/05/2007   GERD 05/05/2007   Osteoarthritis 05/05/2007    Caleb Popp,  SPT 08/16/2020, 11:36 AM  [facility] [address] [place], [place], [address] Phone: [phone]   Fax:  [phone]  Name: ILAH BOULE MRN: [id number] Date of Birth: August 25, 1955

## 2020-08-19 ENCOUNTER — Ambulatory Visit: Payer: 59 | Admitting: Physical Therapy

## 2020-08-19 NOTE — Telephone Encounter (Signed)
Last office visit- 07/24/2020 Last refill- 01/23/2020-- 30 capsules with 5 refills  No future visit is scheduled

## 2020-08-21 ENCOUNTER — Ambulatory Visit: Payer: 59 | Admitting: Physical Therapy

## 2020-08-23 ENCOUNTER — Ambulatory Visit: Payer: 59 | Admitting: Physical Therapy

## 2020-08-26 ENCOUNTER — Encounter: Payer: Self-pay | Admitting: Physical Therapy

## 2020-08-26 ENCOUNTER — Other Ambulatory Visit: Payer: Self-pay

## 2020-08-26 ENCOUNTER — Ambulatory Visit: Payer: 59 | Attending: Physician Assistant | Admitting: Physical Therapy

## 2020-08-26 DIAGNOSIS — M25611 Stiffness of right shoulder, not elsewhere classified: Secondary | ICD-10-CM | POA: Insufficient documentation

## 2020-08-26 DIAGNOSIS — M6281 Muscle weakness (generalized): Secondary | ICD-10-CM | POA: Insufficient documentation

## 2020-08-26 DIAGNOSIS — M25511 Pain in right shoulder: Secondary | ICD-10-CM | POA: Insufficient documentation

## 2020-08-26 DIAGNOSIS — G8929 Other chronic pain: Secondary | ICD-10-CM | POA: Diagnosis present

## 2020-08-26 NOTE — Therapy (Addendum)
[place], [place], [address] Phone: [phone]   Fax:  [phone]  Physical Therapy Treatment  Progress Note Reporting Period 07/23/2020 to 08/26/2020  See note below for Objective Data and Assessment of Progress/Goals.    Patient Details  Name: Kristina Pearson MRN: [id number] Date of Birth: 1956/03/15 Referring Provider (PT): Currence, Gasper Sells, [place]   Encounter Date: 08/26/2020   PT End of Session - 08/26/20 1404     Visit Number 10    Number of Visits 20    Date for PT Re-Evaluation 10/01/20    Authorization Type AETNA    Authorization Time Period FOTO by 10th visit    PT Start Time 1401    PT Stop Time 1448    PT Time Calculation (min) 47 min    Activity Tolerance Patient tolerated treatment well    Behavior During Therapy [facility] for tasks assessed/performed             Past Medical History:  Diagnosis Date   ADHD    ADHD (attention deficit hyperactivity disorder)    ADD   Allergy    Anxiety    Arthritis    osteoarthritrs   Breast cancer ([place]) 08/15/13   right lateral upper outer   Depression    Family history of breast cancer    Family history of colon cancer    Family history of melanoma    Family history of ovarian cancer    Fibromyalgia    GERD (gastroesophageal reflux disease)    TUMS   Headache(784.0)    sees Dr. Gwendel Hanson at [facility]    History of depression    Hx of radiation therapy 10/19/13- 11/09/13   right breast 4256 cGy in 16 sessions, hypo-fractionated   Hypothyroid    Migraine    Osteopenia    Personal history of radiation therapy 2015   PONV (postoperative nausea and vomiting)    Sialoadenitis    Spinal stenosis    Urinary incontinence    Vitiligo    Wears glasses     Past Surgical History:  Procedure Laterality Date   BREAST BIOPSY Right 07/2013   BREAST LUMPECTOMY Right 08/2013   BREAST LUMPECTOMY WITH RADIOACTIVE SEED LOCALIZATION Right  08/30/2013   Procedure: RIGHT PARTIAL MASTECTOMY WITH RADIOACTIVE SEED LOCALIZATION;  Surgeon: [name], MD;  Location: [place];  Service: General;  Laterality: Right;   [place]   lap   COLONOSCOPY  2007   per Dr. Cristina Gong, clear, repeat in 10 yrs   [place]      There were no vitals filed for this visit.   Subjective Assessment - 08/26/20 1406     Subjective Pt reports that she is not using oxyxcodone barely anymore. She is still taking antiinflammatories. While she was sick last week she tried to keep up with her exercises but there were some days that she couldn't. She tried to recreate therapist assisted shoulder PROM so that she wouldn't lose progress while she was at home with success. She usually takes her sling for 3.5-4 hours each day. She see's the surgeon at the ened of the month.    Currently in Pain? Yes    Pain Score 3    6-7/10 with putting on shoes   Pain Location Shoulder  Pain Orientation Right    Pain Descriptors / Indicators Aching    Pain Type Surgical pain    Pain Onset More than a month ago    Pain Frequency Intermittent                  [facility] PT Assessment - 08/26/20 0001       Observation/Other Assessments   Focus on Therapeutic Outcomes (FOTO)  42% functional status      ROM / Strength   AROM / PROM / Strength PROM      PROM   Right Shoulder Flexion 128 Degrees    Right Shoulder External Rotation 40 Degrees                                        [facility] Adult PT Treatment/Exercise - 08/26/20 0001       Self-Care   Other Self-Care Comments  FOTO and education on results, education on short term goal achievements, education on moving to AROM movement/progressing AAROM motion, education on how muscular tightness can cause radicular sx, education on decreasing sling usage time, education  and demonstration on how to perform L rotator cuff TrP release with tennis ball at home      Shoulder Exercises: Supine   Flexion Limitations AAROM x5 with dowel, AROM x5    Other Supine Exercises AROM press up x10      Manual Therapy   Manual Therapy Passive ROM    Soft tissue mobilization L upper trapezius, supraspinatus, and infraspinatus palpation of TrPs and release   infraspinatus caused pts numbness/tingling sx into the 4th and 5th digit   Passive ROM R PROM flexion and ER                          PT Education - 08/26/20 1407     Education Details HEP update    Person(s) Educated Patient    Methods Explanation;Demonstration;Handout    Comprehension Verbalized understanding;Need further instruction              PT Short Term Goals - 08/26/20 1404       PT SHORT TERM GOAL #1   Title Patient will be I with initial HEP to progress with PT    Time --    Period Weeks    Status Achieved    Target Date --      PT SHORT TERM GOAL #2   Title Patient will demonstrate PROM >/= 120 deg elevation to reduce shoulder stiffness and progress allowable range    Baseline 128    Time --    Period --    Status Achieved    Target Date --      PT SHORT TERM GOAL #3   Title Patient will report </= 5/10 pain with therapy to reduce limitation and allow progression within protocol    Time --    Period --    Status Achieved    Target Date --      PT SHORT TERM GOAL #4   Title PT will review FOTO with patient by 3rd visit    Status Achieved                PT Long Term Goals - 07/23/20 1323       PT LONG TERM GOAL #1   Title Patient will be I  with final HEP to maintain progress from PT    Time 10    Period Weeks    Status New    Target Date 10/01/20      PT LONG TERM GOAL #2   Title Patient will report improved functional status >/= 55% on FOTO    Time 12    Period Weeks    Status New    Target Date 10/15/20      PT LONG TERM GOAL #3   Title Patient  will demonstrate right shoulder AROM grossly WFL to return to swimming    Time 10    Period Weeks    Status New    Target Date 10/01/20      PT LONG TERM GOAL #4   Title Patient will demonstrate right shoulder rotator cuff strength >/= 4/5 MMT to be able to lift objects into top cabinets    Time 12    Period Weeks    Status New    Target Date 10/15/20                        Plan - 08/26/20 1507     Clinical Impression Statement Pt tolerated tx well with no adverse effects. Pt was progressed to AROM in the supine position this session at low reps with flexion and shoulder presses with good tolerance and no increase in pain. PROM was at 128 degrees as well as ER at 40 degrees this session and all short term goals were achieved. FOTO results have increased by 15% since just 3 weeks ago, since pt started to take arm out of sling. Pt continues to complain of L shoulder pain and radiating into the sx that over the weekend lasted constantly for 2 days. Skilled palpation of trigger points in the L infraspinatus recreated radicular sx, so manual as well as tennis ball self release was performed. Pt continues to benefit from skilled PT in order to further progress ROM, strength, postural endurance, and decrease TrPs in order to increase functional usage of the R UE in order to return to PLOF and decrease pain.    PT Treatment/Interventions ADLs/Self Care Home Management;Aquatic Therapy;Cryotherapy;Electrical Stimulation;Iontophoresis 4mg /ml Dexamethasone;Moist Heat;Neuromuscular re-education;Therapeutic exercise;Therapeutic activities;Functional mobility training;Patient/family education;Manual techniques;Dry needling;Passive range of motion;Taping;Joint Manipulations    PT Next Visit Plan R arm AROM and AAROM progression from supine to inclined supine PRN, L shoulder strengthening exercise PRN    PT Home Exercise Plan [email or web address]    Consulted and Agree with Plan of Care Patient              Patient will benefit from skilled therapeutic intervention in order to improve the following deficits and impairments:  Decreased range of motion,Postural dysfunction,Decreased strength,Pain,Decreased activity tolerance,Impaired UE functional use  Visit Diagnosis: Chronic right shoulder pain  Stiffness of right shoulder, not elsewhere classified  Muscle weakness (generalized)      Problem List Patient Active Problem List   Diagnosis Date Noted   Genetic testing 10/02/2019   Environmental and seasonal allergies 09/19/2019   Family history of colon cancer    Family history of melanoma    Family history of ovarian cancer    Family history of breast cancer    Anxiety disorder 01/09/2019   Cervical stenosis of spine 10/21/2018   Insomnia 10/21/2018   Breast tenderness in female 02/05/2014   Pes planus of both feet 01/15/2014   Low back pain 11/23/2013   Phlebitis 10/06/2013   History of  depression 09/26/2013   Breast cancer of upper-outer quadrant of right female breast ([place]) 08/18/2013   ADHD (attention deficit hyperactivity disorder) 12/01/2011   Headache 04/03/2009   Hypothyroidism 05/05/2007   GERD 05/05/2007   Osteoarthritis 05/05/2007    Caleb Popp, SPT 08/26/2020, 3:18 PM  [facility] [address] [place], [place], [address] Phone: [phone]   Fax:  [phone]  Name: Kristina Pearson MRN: [id number] Date of Birth: 06-20-1956

## 2020-08-26 NOTE — Patient Instructions (Signed)
Access Code: [email or web address] URL: [email or web address] Date: 08/26/2020 Prepared by: Caleb Popp  Exercises Supine Shoulder Press with Dowel AROM - 2 x daily - 7 x weekly - 2 sets - 10 reps Supine Shoulder Flexion Extension AAROM and AROM with Dowel - 2 x daily - 7 x weekly - 2 sets - 5 reps

## 2020-08-28 ENCOUNTER — Encounter: Payer: 59 | Admitting: Physical Therapy

## 2020-08-30 ENCOUNTER — Encounter: Payer: Self-pay | Admitting: Physical Therapy

## 2020-08-30 ENCOUNTER — Ambulatory Visit: Payer: 59 | Admitting: Physical Therapy

## 2020-08-30 ENCOUNTER — Other Ambulatory Visit: Payer: Self-pay

## 2020-08-30 DIAGNOSIS — M25511 Pain in right shoulder: Secondary | ICD-10-CM | POA: Diagnosis not present

## 2020-08-30 DIAGNOSIS — M6281 Muscle weakness (generalized): Secondary | ICD-10-CM

## 2020-08-30 DIAGNOSIS — G8929 Other chronic pain: Secondary | ICD-10-CM

## 2020-08-30 DIAGNOSIS — M25611 Stiffness of right shoulder, not elsewhere classified: Secondary | ICD-10-CM

## 2020-08-30 NOTE — Therapy (Addendum)
[place], [place], [address] Phone: [phone]   Fax:  [phone]  Physical Therapy Treatment  Patient Details  Name: Kristina Pearson MRN: [id number] Date of Birth: 10-Nov-1955 Referring Provider (PT): Currence, Gasper Sells, [place]   Encounter Date: 08/30/2020   PT End of Session - 09/02/20 1404    Visit Number 11    Number of Visits 20    Date for PT Re-Evaluation 10/01/20    Authorization Type AETNA    Authorization Time Period FOTO by 10th visit    PT Start Time 1135    PT Stop Time 1218    PT Time Calculation (min) 43 min    Activity Tolerance Patient tolerated treatment well    Behavior During Therapy [facility] for tasks assessed/performed           Past Medical History:  Diagnosis Date  . ADHD   . ADHD (attention deficit hyperactivity disorder)    ADD  . Allergy   . Anxiety   . Arthritis    osteoarthritrs  . Breast cancer ([place]) 08/15/13   right lateral upper outer  . Depression   . Family history of breast cancer   . Family history of colon cancer   . Family history of melanoma   . Family history of ovarian cancer   . Fibromyalgia   . GERD (gastroesophageal reflux disease)    TUMS  . BSJGGEZM(629.4)    sees Dr. Gwendel Hanson at [facility]   . History of depression   . Hx of radiation therapy 10/19/13- 11/09/13   right breast 4256 cGy in 16 sessions, hypo-fractionated  . Hypothyroid   . Migraine   . Osteopenia   . Personal history of radiation therapy 2015  . PONV (postoperative nausea and vomiting)   . Sialoadenitis   . Spinal stenosis   . Urinary incontinence   . Vitiligo   . Wears glasses     Past Surgical History:  Procedure Laterality Date  . BREAST BIOPSY Right 07/2013  . BREAST LUMPECTOMY Right 08/2013  . BREAST LUMPECTOMY WITH RADIOACTIVE SEED LOCALIZATION Right 08/30/2013   Procedure: RIGHT PARTIAL MASTECTOMY WITH RADIOACTIVE SEED LOCALIZATION;  Surgeon: [name], MD;  Location: [facility];  Service: General;  Laterality: Right;  . BREAST SURGERY    . CHOLECYSTECTOMY  1996   lap  . COLONOSCOPY  2007   per Dr. Cristina Gong, clear, repeat in 10 yrs  . DIAGNOSTIC LAPAROSCOPY  1982   exp  . LAPAROSCOPIC OVARIAN CYSTECTOMY    . URETHRAL DILATION      There were no vitals filed for this visit.   Subjective Assessment - 09/02/20 1403    Subjective Was out of the sling for 4.5 hours yesterday. My hands are really bothering me as well as my neck.    Currently in Pain? Yes    Pain Score 3     Pain Location Shoulder    Pain Orientation Right                             [facility] Adult PT Treatment/Exercise - 09/02/20 0001      Neck Exercises: Machines for Strengthening   UBE (Upper Arm Bike) L1 (60fwd/2bck) with L arm doing majority of the work      Shoulder Exercises: Supine   Flexion Limitations AAROM x2, AROM x8    Other Supine Exercises AROM press  up x10      Shoulder Exercises: Seated   Other Seated Exercises pulleys x10      Shoulder Exercises: Standing   External Rotation 15 reps;Left    Theraband Level (Shoulder External Rotation) Level 1 (Yellow)    External Rotation Limitations 2 sets    Internal Rotation Left;15 reps    Theraband Level (Shoulder Internal Rotation) Level 1 (Yellow)    Internal Rotation Limitations 2 sets    Other Standing Exercises L scaption to 90, 1# 2x10      Manual Therapy   Manual Therapy Passive ROM    Passive ROM R PROM flexion and ER                    PT Short Term Goals - 08/26/20 1404      PT SHORT TERM GOAL #1   Title Patient will be I with initial HEP to progress with PT    Time --    Period Weeks    Status Achieved    Target Date --      PT SHORT TERM GOAL #2   Title Patient will demonstrate PROM >/= 120 deg elevation to reduce shoulder stiffness and progress allowable range    Baseline 128    Time --    Period --    Status Achieved    Target  Date --      PT SHORT TERM GOAL #3   Title Patient will report </= 5/10 pain with therapy to reduce limitation and allow progression within protocol    Time --    Period --    Status Achieved    Target Date --      PT SHORT TERM GOAL #4   Title PT will review FOTO with patient by 3rd visit    Status Achieved             PT Long Term Goals - 07/23/20 1323      PT LONG TERM GOAL #1   Title Patient will be I with final HEP to maintain progress from PT    Time 10    Period Weeks    Status New    Target Date 10/01/20      PT LONG TERM GOAL #2   Title Patient will report improved functional status >/= 55% on FOTO    Time 12    Period Weeks    Status New    Target Date 10/15/20      PT LONG TERM GOAL #3   Title Patient will demonstrate right shoulder AROM grossly WFL to return to swimming    Time 10    Period Weeks    Status New    Target Date 10/01/20      PT LONG TERM GOAL #4   Title Patient will demonstrate right shoulder rotator cuff strength >/= 4/5 MMT to be able to lift objects into top cabinets    Time 12    Period Weeks    Status New    Target Date 10/15/20                 Plan - 09/02/20 1404    Clinical Impression Statement Pt tolerated tx well with no adverse effects. L shoulder exercises were performed this session to help with pain in the L shoulder from overusage. UBE was used this session with mostly the L UE going against the resistance but pt tolerated this well. Pulleys were also implemented this session  with good tolerance. Further R shoulder AROM was performed this session to help with progressing towards strengthening. Pt continues to benefit from skilled PT in order to increase R UE functional use, ROM, and strenght in order to return to PLOF and decrease pain.    PT Treatment/Interventions ADLs/Self Care Home Management;Aquatic Therapy;Cryotherapy;Electrical Stimulation;Iontophoresis 4mg /ml Dexamethasone;Moist Heat;Neuromuscular  re-education;Therapeutic exercise;Therapeutic activities;Functional mobility training;Patient/family education;Manual techniques;Dry needling;Passive range of motion;Taping;Joint Manipulations    PT Next Visit Plan R arm AROM and AAROM progression from supine to inclined supine PRN, L shoulder strengthening exercise PRN, pulleys    PT Home Exercise Plan [id number]    Consulted and Agree with Plan of Care Patient           Patient will benefit from skilled therapeutic intervention in order to improve the following deficits and impairments:  Decreased range of motion,Postural dysfunction,Decreased strength,Pain,Decreased activity tolerance,Impaired UE functional use  Visit Diagnosis: Chronic right shoulder pain  Stiffness of right shoulder, not elsewhere classified  Muscle weakness (generalized)     Problem List Patient Active Problem List   Diagnosis Date Noted  . Genetic testing 10/02/2019  . Environmental and seasonal allergies 09/19/2019  . Family history of colon cancer   . Family history of melanoma   . Family history of ovarian cancer   . Family history of breast cancer   . Anxiety disorder 01/09/2019  . Cervical stenosis of spine 10/21/2018  . Insomnia 10/21/2018  . Breast tenderness in female 02/05/2014  . Pes planus of both feet 01/15/2014  . Low back pain 11/23/2013  . Phlebitis 10/06/2013  . History of depression 09/26/2013  . Breast cancer of upper-outer quadrant of right female breast ([place]) 08/18/2013  . ADHD (attention deficit hyperactivity disorder) 12/01/2011  . Headache 04/03/2009  . Hypothyroidism 05/05/2007  . GERD 05/05/2007  . Osteoarthritis 05/05/2007    Caleb Popp, SPT 09/02/2020, 2:05 PM  [facility] [address] [place], [place], [address] Phone: [phone]   Fax:  [phone]  Name: Kristina Pearson MRN: [id number] Date of Birth: [birth year]

## 2020-08-30 NOTE — Patient Instructions (Signed)
Access Code: [email or web address] URL: [email or web address] Date: 08/30/2020 Prepared by: Caleb Popp  Exercises Supine Shoulder Press with Dowel - 2 x daily - 7 x weekly - 2 sets - 10 reps Supine Shoulder Flexion Extension AAROM with Dowel - 2 x daily - 7 x weekly - 2 sets - 5 reps Supine Shoulder External Rotation AAROM with Dowel - 2 x daily - 7 x weekly - 10 reps - 2 sets Supine Suboccipital Release with Tennis Balls Seated Shoulder Flexion Towel Slide at Table Top - 2 x daily - 7 x weekly - 10 reps - 10 seconds hold Seated Shoulder External Rotation with Dowel - 2 x daily - 7 x weekly - 2 sets - 10 reps Seated Scapular Retraction - 2 x daily - 7 x weekly - 10 reps - 5 seconds hold Seated Cervical Retraction - 2-3 x daily - 7 x weekly - 10 reps - 5 seconds hold Ulnar Nerve Flossing - 2-3 x daily - 7 x weekly - 10 reps Standing Shoulder External Rotation Stretch in Doorway - 2 x daily - 7 x weekly - 10 reps - 10 seconds hold Standing 'L' Stretch at [employer] - 2 x daily - 7 x weekly - 10 reps - 10 seconds hold L shoulder Shoulder External Rotation with Anchored Resistance - 1 x daily - 7 x weekly - 3 sets - 10 reps L shoulder Shoulder Internal Rotation with Resistance - 1 x daily - 7 x weekly - 3 sets - 10 reps L shoulder Scaption with Dumbbells - 1 x daily - 7 x weekly - 3 sets - 10 reps

## 2020-09-02 ENCOUNTER — Ambulatory Visit: Payer: 59 | Admitting: Physical Therapy

## 2020-09-02 ENCOUNTER — Other Ambulatory Visit: Payer: Self-pay

## 2020-09-02 ENCOUNTER — Encounter: Payer: Self-pay | Admitting: Physical Therapy

## 2020-09-02 DIAGNOSIS — M25611 Stiffness of right shoulder, not elsewhere classified: Secondary | ICD-10-CM

## 2020-09-02 DIAGNOSIS — M25511 Pain in right shoulder: Secondary | ICD-10-CM | POA: Diagnosis not present

## 2020-09-02 DIAGNOSIS — M6281 Muscle weakness (generalized): Secondary | ICD-10-CM

## 2020-09-02 DIAGNOSIS — G8929 Other chronic pain: Secondary | ICD-10-CM

## 2020-09-02 NOTE — Therapy (Addendum)
[place], [place], [address] Phone: [phone]   Fax:  [phone]  Physical Therapy Treatment  Patient Details  Name: Kristina Pearson MRN: [id number] Date of Birth: 1956-03-21 Referring Provider (PT): Currence, Gasper Sells, [place]   Encounter Date: 09/02/2020   PT End of Session - 09/02/20 1550     Visit Number 12    Number of Visits 20    Date for PT Re-Evaluation 10/01/20    Authorization Type AETNA    PT Start Time 1540    PT Stop Time 1620    PT Time Calculation (min) 40 min    Activity Tolerance Patient limited by pain;Patient tolerated treatment well    Behavior During Therapy [facility] for tasks assessed/performed             Past Medical History:  Diagnosis Date   ADHD    ADHD (attention deficit hyperactivity disorder)    ADD   Allergy    Anxiety    Arthritis    osteoarthritrs   Breast cancer ([place]) 08/15/13   right lateral upper outer   Depression    Family history of breast cancer    Family history of colon cancer    Family history of melanoma    Family history of ovarian cancer    Fibromyalgia    GERD (gastroesophageal reflux disease)    TUMS   Headache(784.0)    sees Dr. Gwendel Hanson at [facility]    History of depression    Hx of radiation therapy 10/19/13- 11/09/13   right breast 4256 cGy in 16 sessions, hypo-fractionated   Hypothyroid    Migraine    Osteopenia    Personal history of radiation therapy 2015   PONV (postoperative nausea and vomiting)    Sialoadenitis    Spinal stenosis    Urinary incontinence    Vitiligo    Wears glasses     Past Surgical History:  Procedure Laterality Date   BREAST BIOPSY Right 07/2013   BREAST LUMPECTOMY Right 08/2013   BREAST LUMPECTOMY WITH RADIOACTIVE SEED LOCALIZATION Right 08/30/2013   Procedure: RIGHT PARTIAL MASTECTOMY WITH RADIOACTIVE SEED LOCALIZATION;  Surgeon: [name], MD;  Location: [place];  Service: General;  Laterality: Right;   [place]   lap   COLONOSCOPY  2007   per Dr. Cristina Gong, clear, repeat in 10 yrs   [place]      There were no vitals filed for this visit.   Subjective Assessment - 09/02/20 1541     Subjective I have a lot of pain in the front of the shoulder (near the pec minor). I had a really bad time sleeping saturday night and sunday night, as well as pain during sunday day because of the pain.    Currently in Pain? Yes    Pain Score 5     Pain Location Shoulder    Pain Orientation Right    Pain Type Surgical pain    Pain Onset More than a month ago    Pain Frequency Intermittent  [facility] Adult PT Treatment/Exercise - 09/02/20 1602       Self-Care   Other Self-Care Comments  education on not wearing sling and returning to sleep in recliner and how this could be contributing to pain, also educated on that at 9 weeks she shouldn't be wearing the sling and should be back in bed sleeping instead of in the recliner (PT previously tried to slowly wean her off but pt still not compliant with weaning-stayed in sling majority of the time)      Neck Exercises: Machines for Strengthening   UBE (Upper Arm Bike) L1 (52fwd/2bck) with L arm doing majority of the work      Shoulder Exercises: Supine   Flexion Limitations --    Other Supine Exercises --      Shoulder Exercises: Seated   Retraction 10 reps   15 second holds   Retraction Limitations focus on stretching pec minor    Other Seated Exercises pulleys x10      Shoulder Exercises: Standing   External Rotation --    Theraband Level (Shoulder External Rotation) --    External Rotation Limitations --    Internal Rotation --    Theraband Level (Shoulder Internal Rotation) --    Internal Rotation Limitations --    Other  Standing Exercises --      Shoulder Exercises: Stretch   Other Shoulder Stretches pec minor stretch on 1/2 foam roller 2x60 seconds supine    Other Shoulder Stretches standing pec minor stretch on full foam roller against wall with shoulder extension 2x30 seconds and active ER 4x15 seconds      Manual Therapy   Manual Therapy Passive ROM    Soft tissue mobilization R pec minor manual    Passive ROM R PROM flexion and ER                          PT Education - 09/02/20 1548     Education Details HEP update    Person(s) Educated Patient    Methods Explanation;Demonstration    Comprehension Verbalized understanding;Returned demonstration              PT Short Term Goals - 08/26/20 1404       PT SHORT TERM GOAL #1   Title Patient will be I with initial HEP to progress with PT    Time --    Period Weeks    Status Achieved    Target Date --      PT SHORT TERM GOAL #2   Title Patient will demonstrate PROM >/= 120 deg elevation to reduce shoulder stiffness and progress allowable range    Baseline 128    Time --    Period --    Status Achieved    Target Date --      PT SHORT TERM GOAL #3   Title Patient will report </= 5/10 pain with therapy to reduce limitation and allow progression within protocol    Time --    Period --    Status Achieved    Target Date --      PT SHORT TERM GOAL #4   Title PT will review FOTO with patient by 3rd visit    Status Achieved                PT Long Term Goals - 07/23/20 1323       PT LONG TERM GOAL #1   Title Patient will be  I with final HEP to maintain progress from PT    Time 10    Period Weeks    Status New    Target Date 10/01/20      PT LONG TERM GOAL #2   Title Patient will report improved functional status >/= 55% on FOTO    Time 12    Period Weeks    Status New    Target Date 10/15/20      PT LONG TERM GOAL #3   Title Patient will demonstrate right shoulder AROM grossly WFL to return to swimming     Time 10    Period Weeks    Status New    Target Date 10/01/20      PT LONG TERM GOAL #4   Title Patient will demonstrate right shoulder rotator cuff strength >/= 4/5 MMT to be able to lift objects into top cabinets    Time 12    Period Weeks    Status New    Target Date 10/15/20                        Plan - 09/02/20 1748     Clinical Impression Statement Pt had pain in the R UE near the pec minor this sessoin, so session focused on pain modulation. Ways of stretching the pec minor without breaking precautions/ going into full abduction were implemented this session. Pt able to progress with PROM into flexion and ER this session. Able to continue performing UBE this session with no increase in pain. Pt performed pulleys this session with a shortened lever arm (90 degrees at the elbows) and progress further in ROM each rep. Pt educated that she needs to completely wean off of R sling, since she still wears it at night and during majority of the day (doesn't wear it for 5 hours of the day) as she should only be using it for pain modulation if needed. Also educated on how her posture could be leading to pec minor tightness/pain. Continues to benefit from skilled PT in order to further gain functoinal use of R UE, increase ROM and strength, and decrease pain in order to return to PLOF.    PT Treatment/Interventions ADLs/Self Care Home Management;Aquatic Therapy;Cryotherapy;Electrical Stimulation;Iontophoresis 4mg /ml Dexamethasone;Moist Heat;Neuromuscular re-education;Therapeutic exercise;Therapeutic activities;Functional mobility training;Patient/family education;Manual techniques;Dry needling;Passive range of motion;Taping;Joint Manipulations    PT Next Visit Plan R arm AROM and AAROM progression from supine to inclined supine PRN, L shoulder strengthening exercise PRN, pulleys, pec minor soft tissue PRN    PT Home Exercise Plan [id number]    Consulted and Agree with Plan of Care Patient              Patient will benefit from skilled therapeutic intervention in order to improve the following deficits and impairments:  Decreased range of motion,Postural dysfunction,Decreased strength,Pain,Decreased activity tolerance,Impaired UE functional use  Visit Diagnosis: Chronic right shoulder pain  Stiffness of right shoulder, not elsewhere classified  Muscle weakness (generalized)      Problem List Patient Active Problem List   Diagnosis Date Noted   Genetic testing 10/02/2019   Environmental and seasonal allergies 09/19/2019   Family history of colon cancer    Family history of melanoma    Family history of ovarian cancer    Family history of breast cancer    Anxiety disorder 01/09/2019   Cervical stenosis of spine 10/21/2018   Insomnia 10/21/2018   Breast tenderness in female 02/05/2014   Pes planus  of both feet 01/15/2014   Low back pain 11/23/2013   Phlebitis 10/06/2013   History of depression 09/26/2013   Breast cancer of upper-outer quadrant of right female breast ([place]) 08/18/2013   ADHD (attention deficit hyperactivity disorder) 12/01/2011   Headache 04/03/2009   Hypothyroidism 05/05/2007   GERD 05/05/2007   Osteoarthritis 05/05/2007    Caleb Popp, SPT 09/02/2020, 5:57 PM  [facility] [address] [place], [place], [address] Phone: [phone]   Fax:  [phone]  Name: Kristina Pearson MRN: [id number] Date of Birth: Jul 23, 1955

## 2020-09-02 NOTE — Patient Instructions (Signed)
Access Code: [email or web address] URL: [email or web address] Date: 09/02/2020 Prepared by: Caleb Popp  Exercises Shoulder External Rotation and Scapular Retraction - 1 x daily - 7 x weekly - 1 sets - 10 reps

## 2020-09-03 ENCOUNTER — Telehealth: Payer: Self-pay

## 2020-09-03 MED ORDER — ALPRAZOLAM 0.5 MG PO TABS
0.5000 mg | ORAL_TABLET | Freq: Two times a day (BID) | ORAL | 5 refills | Status: DC | PRN
Start: 2020-09-03 — End: 2021-03-19

## 2020-09-03 NOTE — Telephone Encounter (Signed)
Request sent to Dr Sarajane Jews for approval

## 2020-09-03 NOTE — Addendum Note (Signed)
Addended by: [name] A on: 09/03/2020 02:56 PM   Modules accepted: Orders

## 2020-09-03 NOTE — Telephone Encounter (Signed)
Spoke with pt advised that prescription for Diflucan was sent to her pharmacy, verbalized understanding

## 2020-09-03 NOTE — Telephone Encounter (Signed)
Done

## 2020-09-05 ENCOUNTER — Telehealth: Payer: Self-pay | Admitting: Family Medicine

## 2020-09-05 NOTE — Telephone Encounter (Signed)
Pt last office visit was on 08/13/2020 and last refill was done on 05/31/2020 for 30 tablets, please advise

## 2020-09-05 NOTE — Telephone Encounter (Signed)
Patient is calling and requesting a refill for lisdexamfetamine (VYVANSE) 50 MG capsule to be sent to [id number]  [place], [place] [address]  Phone:  [phone] Fax:  [phone]  CB is [phone]

## 2020-09-06 ENCOUNTER — Ambulatory Visit: Payer: 59 | Admitting: Physical Therapy

## 2020-09-06 ENCOUNTER — Encounter: Payer: Self-pay | Admitting: Physical Therapy

## 2020-09-06 ENCOUNTER — Other Ambulatory Visit: Payer: Self-pay

## 2020-09-06 DIAGNOSIS — M6281 Muscle weakness (generalized): Secondary | ICD-10-CM

## 2020-09-06 DIAGNOSIS — M25611 Stiffness of right shoulder, not elsewhere classified: Secondary | ICD-10-CM

## 2020-09-06 DIAGNOSIS — G8929 Other chronic pain: Secondary | ICD-10-CM

## 2020-09-06 DIAGNOSIS — M25511 Pain in right shoulder: Secondary | ICD-10-CM | POA: Diagnosis not present

## 2020-09-06 MED ORDER — LISDEXAMFETAMINE DIMESYLATE 50 MG PO CAPS: 50.0000 mg | ORAL_CAPSULE | Freq: Every day | ORAL | 0 refills | Status: DC

## 2020-09-06 NOTE — Therapy (Addendum)
[place], [place], [address] Phone: [phone]   Fax:  [phone]  Physical Therapy Treatment  Patient Details  Name: Kristina Pearson MRN: [id number] Date of Birth: 01-Apr-1956 Referring Provider (PT): Currence, Gasper Sells, [place]   Encounter Date: 09/06/2020   PT End of Session - 09/06/20 1053     Visit Number 13    Number of Visits 20    Date for PT Re-Evaluation 10/01/20    Authorization Type AETNA    PT Start Time 1048    PT Stop Time 1129    PT Time Calculation (min) 41 min    Activity Tolerance Patient tolerated treatment well    Behavior During Therapy [facility] for tasks assessed/performed             Past Medical History:  Diagnosis Date   ADHD    ADHD (attention deficit hyperactivity disorder)    ADD   Allergy    Anxiety    Arthritis    osteoarthritrs   Breast cancer ([place]) 08/15/13   right lateral upper outer   Depression    Family history of breast cancer    Family history of colon cancer    Family history of melanoma    Family history of ovarian cancer    Fibromyalgia    GERD (gastroesophageal reflux disease)    TUMS   Headache(784.0)    sees Dr. Gwendel Hanson at [facility]    History of depression    Hx of radiation therapy 10/19/13- 11/09/13   right breast 4256 cGy in 16 sessions, hypo-fractionated   Hypothyroid    Migraine    Osteopenia    Personal history of radiation therapy 2015   PONV (postoperative nausea and vomiting)    Sialoadenitis    Spinal stenosis    Urinary incontinence    Vitiligo    Wears glasses     Past Surgical History:  Procedure Laterality Date   BREAST BIOPSY Right 07/2013   BREAST LUMPECTOMY Right 08/2013   BREAST LUMPECTOMY WITH RADIOACTIVE SEED LOCALIZATION Right 08/30/2013   Procedure: RIGHT PARTIAL MASTECTOMY WITH RADIOACTIVE SEED LOCALIZATION;  Surgeon: [name], MD;  Location: [place];  Service: General;   Laterality: Right;   [place]   lap   COLONOSCOPY  2007   per Dr. Cristina Gong, clear, repeat in 10 yrs   [place]      There were no vitals filed for this visit.   Subjective Assessment - 09/06/20 1056     Subjective Yesterday was my first day not wearing the sling at all. I weaned off of it Monday-Wednesday. I am using the pillow to prop up my arm while watching tv. I am still sleeping in the recliner because I am restless.    Currently in Pain? Yes    Pain Score 2     Pain Location Shoulder    Pain Orientation Right    Pain Descriptors / Indicators Aching    Pain Type Surgical pain    Pain Onset More than a month ago    Pain Frequency Intermittent  [place] Adult PT Treatment/Exercise - 09/06/20 0001       Neck Exercises: Machines for Strengthening   UBE (Upper Arm Bike) L1 6 minutes (3 fwd/3bck)      Shoulder Exercises: Supine   Protraction 10 reps   2 sets   Protraction Limitations serratus punches    Flexion Limitations AROM x10      Shoulder Exercises: Seated   Other Seated Exercises pulleys x10      Shoulder Exercises: Prone   Extension 10 reps;Right   2 sets   Extension Limitations to hip height with R arm hanging off    Other Prone Exercises R rows 2x10 with arm hanging off plinth 2x10      Shoulder Exercises: Standing   Extension 10 reps   2 sets   Extension Limitations isometrics 2x10 against plinth    Row --    Row Limitations --      Manual Therapy   Manual Therapy Passive ROM    Passive ROM R PROM flexion and ER                          PT Education - 09/06/20 1049     Education Details HEP update    Person(s) Educated Patient    Methods Explanation;Demonstration    Comprehension Verbalized understanding;Returned demonstration              PT Short Term  Goals - 08/26/20 1404       PT SHORT TERM GOAL #1   Title Patient will be I with initial HEP to progress with PT    Time --    Period Weeks    Status Achieved    Target Date --      PT SHORT TERM GOAL #2   Title Patient will demonstrate PROM >/= 120 deg elevation to reduce shoulder stiffness and progress allowable range    Baseline 128    Time --    Period --    Status Achieved    Target Date --      PT SHORT TERM GOAL #3   Title Patient will report </= 5/10 pain with therapy to reduce limitation and allow progression within protocol    Time --    Period --    Status Achieved    Target Date --      PT SHORT TERM GOAL #4   Title PT will review FOTO with patient by 3rd visit    Status Achieved                PT Long Term Goals - 07/23/20 1323       PT LONG TERM GOAL #1   Title Patient will be I with final HEP to maintain progress from PT    Time 10    Period Weeks    Status New    Target Date 10/01/20      PT LONG TERM GOAL #2   Title Patient will report improved functional status >/= 55% on FOTO    Time 12    Period Weeks    Status New    Target Date 10/15/20      PT LONG TERM GOAL #3   Title Patient will demonstrate right shoulder AROM grossly WFL to return to swimming    Time 10    Period Weeks    Status New    Target Date 10/01/20      PT LONG TERM GOAL #  4   Title Patient will demonstrate right shoulder rotator cuff strength >/= 4/5 MMT to be able to lift objects into top cabinets    Time 12    Period Weeks    Status New    Target Date 10/15/20                        Plan - 09/06/20 1131     Clinical Impression Statement Pt tolerated tx well with no adverse effects this session. Session focused on introducing shoulder extension and periscapular rowing isometrics which the pt responded well to, so gravity dependent isotonics were introduced with these exercises which the pt struggled with strength wise but no increase in pain.  Serratus  anterior strengthening introduced with serratus punches to further help with periscapular strengthening. Pt continues to benefit from skilled PT in order to progress periscapular and rotator cuff strengthening, ROM, and functional use of the R UE in order to return PLOF.    PT Treatment/Interventions ADLs/Self Care Home Management;Aquatic Therapy;Cryotherapy;Electrical Stimulation;Iontophoresis 4mg /ml Dexamethasone;Moist Heat;Neuromuscular re-education;Therapeutic exercise;Therapeutic activities;Functional mobility training;Patient/family education;Manual techniques;Dry needling;Passive range of motion;Taping;Joint Manipulations    PT Next Visit Plan consolidate HEP, R arm AROM and AAROM progression from supine to inclined supine PRN, L shoulder strengthening exercise PRN, pulleys, pec minor soft tissue PRN, periscapular strengthening, progress shoulder extension exercises    PT Home Exercise Plan [email or web address]    Consulted and Agree with Plan of Care Patient             Patient will benefit from skilled therapeutic intervention in order to improve the following deficits and impairments:  Decreased range of motion,Postural dysfunction,Decreased strength,Pain,Decreased activity tolerance,Impaired UE functional use  Visit Diagnosis: Chronic right shoulder pain  Stiffness of right shoulder, not elsewhere classified  Muscle weakness (generalized)      Problem List Patient Active Problem List   Diagnosis Date Noted   Genetic testing 10/02/2019   Environmental and seasonal allergies 09/19/2019   Family history of colon cancer    Family history of melanoma    Family history of ovarian cancer    Family history of breast cancer    Anxiety disorder 01/09/2019   Cervical stenosis of spine 10/21/2018   Insomnia 10/21/2018   Breast tenderness in female 02/05/2014   Pes planus of both feet 01/15/2014   Low back pain 11/23/2013   Phlebitis 10/06/2013   History of depression 09/26/2013   Breast  cancer of upper-outer quadrant of right female breast ([place]) 08/18/2013   ADHD (attention deficit hyperactivity disorder) 12/01/2011   Headache 04/03/2009   Hypothyroidism 05/05/2007   GERD 05/05/2007   Osteoarthritis 05/05/2007    Caleb Popp, SPT 09/06/2020, 12:25 PM  [facility] [address] [place], [place], [address] Phone: [phone]   Fax:  [phone]  Name: Kristina Pearson MRN: [id number] Date of Birth: [birth year]

## 2020-09-06 NOTE — Telephone Encounter (Signed)
Done

## 2020-09-10 ENCOUNTER — Ambulatory Visit: Payer: 59 | Admitting: Physical Therapy

## 2020-09-10 ENCOUNTER — Encounter: Payer: Self-pay | Admitting: Physical Therapy

## 2020-09-10 ENCOUNTER — Other Ambulatory Visit: Payer: Self-pay

## 2020-09-10 DIAGNOSIS — M25611 Stiffness of right shoulder, not elsewhere classified: Secondary | ICD-10-CM

## 2020-09-10 DIAGNOSIS — M25511 Pain in right shoulder: Secondary | ICD-10-CM | POA: Diagnosis not present

## 2020-09-10 DIAGNOSIS — M6281 Muscle weakness (generalized): Secondary | ICD-10-CM

## 2020-09-10 DIAGNOSIS — G8929 Other chronic pain: Secondary | ICD-10-CM

## 2020-09-10 NOTE — Therapy (Addendum)
[place], [place], [address] Phone: [phone]   Fax:  [phone]  Physical Therapy Treatment  Patient Details  Name: Kristina Pearson MRN: [id number] Date of Birth: [birth year] Referring Provider (PT): Clance Boll Gasper Sells, [place]   Encounter Date: 09/10/2020   PT End of Session - 09/10/20 1459     Visit Number 14    Number of Visits 20    Date for PT Re-Evaluation 10/01/20    Authorization Type AETNA    Authorization Time Period FOTO by 10th visit    PT Start Time 1402    PT Stop Time 1447    PT Time Calculation (min) 45 min    Activity Tolerance Patient tolerated treatment well    Behavior During Therapy [facility] for tasks assessed/performed             Past Medical History:  Diagnosis Date   ADHD    ADHD (attention deficit hyperactivity disorder)    ADD   Allergy    Anxiety    Arthritis    osteoarthritrs   Breast cancer ([place]) 08/15/13   right lateral upper outer   Depression    Family history of breast cancer    Family history of colon cancer    Family history of melanoma    Family history of ovarian cancer    Fibromyalgia    GERD (gastroesophageal reflux disease)    TUMS   Headache(784.0)    sees Dr. Gwendel Hanson at [facility]    History of depression    Hx of radiation therapy 10/19/13- 11/09/13   right breast 4256 cGy in 16 sessions, hypo-fractionated   Hypothyroid    Migraine    Osteopenia    Personal history of radiation therapy 2015   PONV (postoperative nausea and vomiting)    Sialoadenitis    Spinal stenosis    Urinary incontinence    Vitiligo    Wears glasses     Past Surgical History:  Procedure Laterality Date   BREAST BIOPSY Right 07/2013   BREAST LUMPECTOMY Right 08/2013   BREAST LUMPECTOMY WITH RADIOACTIVE SEED LOCALIZATION Right 08/30/2013   Procedure: RIGHT PARTIAL MASTECTOMY WITH RADIOACTIVE SEED LOCALIZATION;  Surgeon: [name], MD;  Location:  [place];  Service: General;  Laterality: Right;   [place]   lap   COLONOSCOPY  2007   per Dr. Cristina Gong, clear, repeat in 10 yrs   [place]      There were no vitals filed for this visit.   Subjective Assessment - 09/10/20 1404     Subjective I tried to sleep in the bed but went back to the chair because my arm was bothering me. I was getting sharp stabbing pain last night.    Currently in Pain? Yes    Pain Score 2     Pain Location Shoulder    Pain Orientation Right    Pain Descriptors / Indicators Aching    Pain Type Surgical pain    Pain Onset More than a month ago    Pain Frequency Intermittent  [place] Adult PT Treatment/Exercise - 09/10/20 0001       Neck Exercises: Machines for Strengthening   UBE (Upper Arm Bike) L1 5 minutes (2.5 fwd/2.5bck)      Shoulder Exercises: Supine   Protraction --   2 sets x12   Protraction Limitations serratus punches    Flexion 10 reps;Right    Shoulder Flexion Weight (lbs) 1#    Flexion Limitations only to 90#      Shoulder Exercises: Prone   Other Prone Exercises R rows x10 with arm hanging off plinth   discomfort in neck, so progressed to standing with theraband rows     Shoulder Exercises: Standing   Extension 15 reps;Both   2 sets   Theraband Level (Shoulder Extension) Level 1 (Yellow)    Row 15 reps   2 sets   Theraband Level (Shoulder Row) Level 1 (Yellow)      Shoulder Exercises: Pulleys   Flexion --   x20 each side     Manual Therapy   Manual Therapy Passive ROM    Passive ROM R PROM flexion, abduction, and ER                          PT Education - 09/10/20 1457     Education Details clean up and update HEP (toss old handouts and just go off the ones that we gave her today)    Person(s) Educated Patient     Methods Explanation;Demonstration;Handout    Comprehension Verbalized understanding;Need further instruction;Returned demonstration              PT Short Term Goals - 08/26/20 1404       PT SHORT TERM GOAL #1   Title Patient will be I with initial HEP to progress with PT    Time --    Period Weeks    Status Achieved    Target Date --      PT SHORT TERM GOAL #2   Title Patient will demonstrate PROM >/= 120 deg elevation to reduce shoulder stiffness and progress allowable range    Baseline 128    Time --    Period --    Status Achieved    Target Date --      PT SHORT TERM GOAL #3   Title Patient will report </= 5/10 pain with therapy to reduce limitation and allow progression within protocol    Time --    Period --    Status Achieved    Target Date --      PT SHORT TERM GOAL #4   Title PT will review FOTO with patient by 3rd visit    Status Achieved                PT Long Term Goals - 07/23/20 1323       PT LONG TERM GOAL #1   Title Patient will be I with final HEP to maintain progress from PT    Time 10    Period Weeks    Status New    Target Date 10/01/20      PT LONG TERM GOAL #2   Title Patient will report improved functional status >/= 55% on FOTO    Time 12    Period Weeks    Status New    Target Date 10/15/20      PT LONG TERM GOAL #3   Title Patient will demonstrate right shoulder AROM grossly WFL to  return to swimming    Time 10    Period Weeks    Status New    Target Date 10/01/20      PT LONG TERM GOAL #4   Title Patient will demonstrate right shoulder rotator cuff strength >/= 4/5 MMT to be able to lift objects into top cabinets    Time 12    Period Weeks    Status New    Target Date 10/15/20                        Plan - 09/10/20 1501     Clinical Impression Statement Pt tolerated tx wel with no adverse effects this session. R shoulder abduction PROm introduced this session and pt able to tolerate to about 75 degrees.  Pt noted bicep catching at about 30 degrees elevation today during PROM. Session further focused on periscapular strengthening and progressing prone periscapular strengthening to standing with bands to alleviate neck discomfort with good tolerance. Pt instructed to engage R UE further on UBE to do 50% of the work without any increase in pain. Continues to benefit from skilled PT in order to increase R UE functional use, ROM, and strength in order to return to PLOF.    PT Treatment/Interventions ADLs/Self Care Home Management;Aquatic Therapy;Cryotherapy;Electrical Stimulation;Iontophoresis 4mg /ml Dexamethasone;Moist Heat;Neuromuscular re-education;Therapeutic exercise;Therapeutic activities;Functional mobility training;Patient/family education;Manual techniques;Dry needling;Passive range of motion;Taping;Joint Manipulations    PT Next Visit Plan R arm AROM and AAROM progression from supine to inclined supine PRN, L shoulder strengthening exercise PRN, pulleys, pec minor soft tissue PRN, periscapular strengthening, progress shoulder extension exercises    PT Home Exercise Plan [email or web address]    Consulted and Agree with Plan of Care Patient             Patient will benefit from skilled therapeutic intervention in order to improve the following deficits and impairments:  Decreased range of motion,Postural dysfunction,Decreased strength,Pain,Decreased activity tolerance,Impaired UE functional use  Visit Diagnosis: No diagnosis found.      Problem List Patient Active Problem List   Diagnosis Date Noted   Genetic testing 10/02/2019   Environmental and seasonal allergies 09/19/2019   Family history of colon cancer    Family history of melanoma    Family history of ovarian cancer    Family history of breast cancer    Anxiety disorder 01/09/2019   Cervical stenosis of spine 10/21/2018   Insomnia 10/21/2018   Breast tenderness in female 02/05/2014   Pes planus of both feet 01/15/2014   Low back pain  11/23/2013   Phlebitis 10/06/2013   History of depression 09/26/2013   Breast cancer of upper-outer quadrant of right female breast ([place]) 08/18/2013   ADHD (attention deficit hyperactivity disorder) 12/01/2011   Headache 04/03/2009   Hypothyroidism 05/05/2007   GERD 05/05/2007   Osteoarthritis 05/05/2007    Caleb Popp, SPT 09/10/2020, 3:07 PM  [place] [facility] [place] [address] [place], [place], [address] Phone: [phone]   Fax:  [phone]  Name: Kristina Pearson MRN: [id number] Date of Birth: 1955-08-03

## 2020-09-10 NOTE — Patient Instructions (Signed)
  Access Code: [email or web address] URL: [email or web address] Date: 09/10/2020 Prepared by: Caleb Popp  Exercises  Supine Shoulder Flexion Extension AAROM with Dowel - 2 x daily - 7 x weekly - 2 sets - 5 reps Supine Shoulder External Rotation AAROM with Dowel - 2 x daily - 7 x weekly - 10 reps - 2 sets Supine Suboccipital Release with Tennis Balls Seated Shoulder Flexion Towel Slide at Table Top - 2 x daily - 7 x weekly - 10 reps - 10 seconds hold Seated Scapular Retraction - 2 x daily - 7 x weekly - 10 reps - 5 seconds hold Seated Cervical Retraction - 2-3 x daily - 7 x weekly - 10 reps - 5 seconds hold Standing Shoulder External Rotation Stretch in Doorway - 2 x daily - 7 x weekly - 10 reps - 10 seconds hold Shoulder External Rotation with Anchored Resistance - 1 x daily - 7 x weekly - 3 sets - 10 reps Shoulder Internal Rotation with Resistance - 1 x daily - 7 x weekly - 3 sets - 10 reps Standing Row with Anchored Resistance - 1 x daily - 7 x weekly - 2 sets - 12 reps Shoulder Extension with Resistance - 1 x daily - 7 x weekly - 2 sets - 12 reps Supine Scapular Protraction in Flexion with Dumbbells - 1 x daily - 7 x weekly - 2 sets - 12 reps

## 2020-09-12 NOTE — Telephone Encounter (Signed)
  lisdexamfetamine (VYVANSE) 50 MG capsule  [facility] [place], [place] Phone:  [phone]  Fax:  [phone]

## 2020-09-12 NOTE — Telephone Encounter (Addendum)
Patient is requesting med to be sent to [facility].   Spoke with Lyanne Co at [facility] he said patient does have new insurance

## 2020-09-13 ENCOUNTER — Ambulatory Visit: Payer: 59 | Admitting: Physical Therapy

## 2020-09-13 ENCOUNTER — Other Ambulatory Visit: Payer: Self-pay

## 2020-09-13 ENCOUNTER — Encounter: Payer: Self-pay | Admitting: Physical Therapy

## 2020-09-13 DIAGNOSIS — M6281 Muscle weakness (generalized): Secondary | ICD-10-CM

## 2020-09-13 DIAGNOSIS — G8929 Other chronic pain: Secondary | ICD-10-CM

## 2020-09-13 DIAGNOSIS — M25511 Pain in right shoulder: Secondary | ICD-10-CM

## 2020-09-13 DIAGNOSIS — M25611 Stiffness of right shoulder, not elsewhere classified: Secondary | ICD-10-CM

## 2020-09-13 MED ORDER — LISDEXAMFETAMINE DIMESYLATE 50 MG PO CAPS: 50.0000 mg | ORAL_CAPSULE | Freq: Every day | ORAL | 0 refills | Status: DC

## 2020-09-13 MED ORDER — LISDEXAMFETAMINE DIMESYLATE 50 MG PO CAPS
50.0000 mg | ORAL_CAPSULE | Freq: Every day | ORAL | 0 refills | Status: DC
Start: 2020-10-14 — End: 2020-09-13

## 2020-09-13 MED ORDER — LISDEXAMFETAMINE DIMESYLATE 50 MG PO CAPS
50.0000 mg | ORAL_CAPSULE | Freq: Every day | ORAL | 0 refills | Status: DC
Start: 2020-11-13 — End: 2020-12-06

## 2020-09-13 NOTE — Therapy (Addendum)
[place], [place], [address] Phone: [phone]   Fax:  [phone]  Physical Therapy Treatment  Patient Details  Name: Kristina Pearson MRN: [id number] Date of Birth: [birth year] Referring Provider (PT): Clance Boll Gasper Sells, [place]   Encounter Date: 09/13/2020   PT End of Session - 09/13/20 1052     Visit Number 15    Number of Visits 20    Date for PT Re-Evaluation 10/01/20    Authorization Type AETNA    Authorization Time Period FOTO by 16th visit    PT Start Time 1048    PT Stop Time 1131    PT Time Calculation (min) 43 min    Activity Tolerance Patient tolerated treatment well    Behavior During Therapy [facility] for tasks assessed/performed             Past Medical History:  Diagnosis Date   ADHD    ADHD (attention deficit hyperactivity disorder)    ADD   Allergy    Anxiety    Arthritis    osteoarthritrs   Breast cancer ([place]) 08/15/13   right lateral upper outer   Depression    Family history of breast cancer    Family history of colon cancer    Family history of melanoma    Family history of ovarian cancer    Fibromyalgia    GERD (gastroesophageal reflux disease)    TUMS   Headache(784.0)    sees Dr. Gwendel Hanson at [facility]    History of depression    Hx of radiation therapy 10/19/13- 11/09/13   right breast 4256 cGy in 16 sessions, hypo-fractionated   Hypothyroid    Migraine    Osteopenia    Personal history of radiation therapy 2015   PONV (postoperative nausea and vomiting)    Sialoadenitis    Spinal stenosis    Urinary incontinence    Vitiligo    Wears glasses     Past Surgical History:  Procedure Laterality Date   BREAST BIOPSY Right 07/2013   BREAST LUMPECTOMY Right 08/2013   BREAST LUMPECTOMY WITH RADIOACTIVE SEED LOCALIZATION Right 08/30/2013   Procedure: RIGHT PARTIAL MASTECTOMY WITH RADIOACTIVE SEED LOCALIZATION;  Surgeon: [name], MD;  Location:  [place];  Service: General;  Laterality: Right;   [place]   lap   COLONOSCOPY  2007   per Dr. Cristina Gong, clear, repeat in 10 yrs   [place]      There were no vitals filed for this visit.   Subjective Assessment - 09/13/20 1053     Subjective I slept in bed last night and had bad neck pain that went all the way down into both arms. It was at about an 8/10 but now it's only at a 6/10 pain.    Currently in Pain? Yes    Pain Score 2    8/10   Pain Location Shoulder    Pain Orientation Right    Pain Descriptors / Indicators Aching    Pain Type Surgical pain    Pain Onset More than a month ago    Pain Frequency Intermittent  [place] Adult PT Treatment/Exercise - 09/13/20 0001       Shoulder Exercises: Supine   Protraction 10 reps   2 sets   Protraction Weight (lbs) 1#    Protraction Limitations serratus punches    Flexion 10 reps;Right    Shoulder Flexion Weight (lbs) 2#    Flexion Limitations only to 90 degrees    Other Supine Exercises rhythmic stabilization 3x30" seconds at 90 degrees flexion      Shoulder Exercises: Sidelying   External Rotation 10 reps   3 sets   ABduction 10 reps    ABduction Limitations one with short lever arm one with long lever arm      Shoulder Exercises: ROM/Strengthening   UBE (Upper Arm Bike) UBE L1 5 min (2.5 fwd/2.5bck)      Manual Therapy   Manual Therapy Passive ROM;Joint mobilization    Joint Mobilization grade 3-4 joint mobs inferior and posterior    Passive ROM R PROM flexion, abduction, and ER                          PT Education - 09/13/20 1054     Education Details HEP update    Person(s) Educated Patient    Methods Explanation;Demonstration    Comprehension Verbalized understanding;Returned demonstration;Need further  instruction              PT Short Term Goals - 08/26/20 1404       PT SHORT TERM GOAL #1   Title Patient will be I with initial HEP to progress with PT    Time --    Period Weeks    Status Achieved    Target Date --      PT SHORT TERM GOAL #2   Title Patient will demonstrate PROM >/= 120 deg elevation to reduce shoulder stiffness and progress allowable range    Baseline 128    Time --    Period --    Status Achieved    Target Date --      PT SHORT TERM GOAL #3   Title Patient will report </= 5/10 pain with therapy to reduce limitation and allow progression within protocol    Time --    Period --    Status Achieved    Target Date --      PT SHORT TERM GOAL #4   Title PT will review FOTO with patient by 3rd visit    Status Achieved                PT Long Term Goals - 07/23/20 1323       PT LONG TERM GOAL #1   Title Patient will be I with final HEP to maintain progress from PT    Time 10    Period Weeks    Status New    Target Date 10/01/20      PT LONG TERM GOAL #2   Title Patient will report improved functional status >/= 55% on FOTO    Time 12    Period Weeks    Status New    Target Date 10/15/20      PT LONG TERM GOAL #3   Title Patient will demonstrate right shoulder AROM grossly WFL to return to swimming    Time 10    Period Weeks    Status New    Target Date 10/01/20      PT LONG TERM GOAL #4   Title Patient  will demonstrate right shoulder rotator cuff strength >/= 4/5 MMT to be able to lift objects into top cabinets    Time 12    Period Weeks    Status New    Target Date 10/15/20                        Plan - 09/13/20 1132     Clinical Impression Statement Pt tolerated tx well with no adverse effects this session. Periscapular strengthening done in supine and prone this session and pt showed increase endurance as well as strength. ER and abduction strengthening in sidelying introduced this session with no increase in pain.  Grade III-IV joint mobs added in various ranges this session to increase ROM with good tolerance. Rhythmic stablization also added to increase stablility as well as isometric strength of the R UE. Pt continues to benefit from skiled PT in order to increase functional strength of the UE, increase postural endurance, decrease pain, and return to PLOF.    PT Treatment/Interventions ADLs/Self Care Home Management;Aquatic Therapy;Cryotherapy;Electrical Stimulation;Iontophoresis 4mg /ml Dexamethasone;Moist Heat;Neuromuscular re-education;Therapeutic exercise;Therapeutic activities;Functional mobility training;Patient/family education;Manual techniques;Dry needling;Passive range of motion;Taping;Joint Manipulations    PT Next Visit Plan R arm AROM and AAROM progression from supine to inclined supine PRN, L shoulder strengthening exercise PRN, pulleys, pec minor soft tissue PRN, periscapular strengthening, progress shoulder extension exercises    PT Home Exercise Plan [email or web address]    Consulted and Agree with Plan of Care Patient             Patient will benefit from skilled therapeutic intervention in order to improve the following deficits and impairments:  Decreased range of motion,Postural dysfunction,Decreased strength,Pain,Decreased activity tolerance,Impaired UE functional use  Visit Diagnosis: Chronic right shoulder pain  Stiffness of right shoulder, not elsewhere classified  Muscle weakness (generalized)      Problem List Patient Active Problem List   Diagnosis Date Noted   Genetic testing 10/02/2019   Environmental and seasonal allergies 09/19/2019   Family history of colon cancer    Family history of melanoma    Family history of ovarian cancer    Family history of breast cancer    Anxiety disorder 01/09/2019   Cervical stenosis of spine 10/21/2018   Insomnia 10/21/2018   Breast tenderness in female 02/05/2014   Pes planus of both feet 01/15/2014   Low back pain 11/23/2013    Phlebitis 10/06/2013   History of depression 09/26/2013   Breast cancer of upper-outer quadrant of right female breast ([place]) 08/18/2013   ADHD (attention deficit hyperactivity disorder) 12/01/2011   Headache 04/03/2009   Hypothyroidism 05/05/2007   GERD 05/05/2007   Osteoarthritis 05/05/2007    Caleb Popp, SPT 09/13/2020, 12:27 PM  [facility] [address] [place], [place], [address] Phone: [phone]   Fax:  [phone]  Name: Kristina Pearson MRN: [id number] Date of Birth: 17-Oct-1955

## 2020-09-13 NOTE — Patient Instructions (Signed)
Access Code: [email or web address] URL: [email or web address] Date: 09/13/2020 Prepared by: Caleb Popp  Exercises  Supine Scapular Protraction in Flexion with Dumbbells - 1 x daily - 7 x weekly - 2 sets - 12 reps Sidelying Shoulder External Rotation - 1 x daily - 7 x weekly - 2 sets - 10 reps Sidelying Shoulder Abduction Palm Forward - 1 x daily - 7 x weekly - 2 sets - 10 reps

## 2020-09-13 NOTE — Telephone Encounter (Signed)
I resent them to [employer]

## 2020-09-13 NOTE — Addendum Note (Signed)
Addended by: [name] A on: 09/13/2020 08:05 AM   Modules accepted: Orders

## 2020-09-16 ENCOUNTER — Encounter: Payer: Self-pay | Admitting: Physical Therapy

## 2020-09-16 ENCOUNTER — Other Ambulatory Visit: Payer: Self-pay

## 2020-09-16 ENCOUNTER — Ambulatory Visit: Payer: 59 | Admitting: Physical Therapy

## 2020-09-16 DIAGNOSIS — M25511 Pain in right shoulder: Secondary | ICD-10-CM | POA: Diagnosis not present

## 2020-09-16 DIAGNOSIS — M25611 Stiffness of right shoulder, not elsewhere classified: Secondary | ICD-10-CM

## 2020-09-16 DIAGNOSIS — G8929 Other chronic pain: Secondary | ICD-10-CM

## 2020-09-16 DIAGNOSIS — M6281 Muscle weakness (generalized): Secondary | ICD-10-CM

## 2020-09-16 NOTE — Patient Instructions (Signed)
Access Code: [email or web address] URL: [email or web address] Date: 09/16/2020 Prepared by: Hilda Blades  Exercises Supine Shoulder Flexion Extension AAROM with Dowel - 2 x daily - 7 x weekly - 2 sets - 5 reps Supine Shoulder External Rotation AAROM with Dowel - 2 x daily - 7 x weekly - 10 reps - 2 sets Supine Suboccipital Release with Tennis Balls Seated Shoulder Flexion Towel Slide at Table Top - 2 x daily - 7 x weekly - 10 reps - 10 seconds hold Seated Scapular Retraction - 2 x daily - 7 x weekly - 10 reps - 5 seconds hold Seated Cervical Retraction - 2-3 x daily - 7 x weekly - 10 reps - 5 seconds hold Standing Shoulder External Rotation Stretch in Doorway - 2 x daily - 7 x weekly - 10 reps - 10 seconds hold Shoulder External Rotation with Anchored Resistance - 1 x daily - 7 x weekly - 3 sets - 10 reps Shoulder Internal Rotation with Resistance - 1 x daily - 7 x weekly - 3 sets - 10 reps Standing Row with Anchored Resistance - 1 x daily - 7 x weekly - 2 sets - 12 reps Shoulder Extension with Resistance - 1 x daily - 7 x weekly - 2 sets - 12 reps Supine Scapular Protraction in Flexion with Dumbbells - 1 x daily - 7 x weekly - 2 sets - 12 reps Sidelying Shoulder External Rotation - 1 x daily - 7 x weekly - 2 sets - 10 reps Sidelying Shoulder Abduction Palm Forward - 1 x daily - 7 x weekly - 2 sets - 10 reps Isometric Shoulder Abduction at Wall - 1 x daily - 7 x weekly - 2 sets - 10 reps - 5 seconds hold

## 2020-09-16 NOTE — Therapy (Addendum)
[place], [place], [address] Phone: [phone]   Fax:  [phone]  Physical Therapy Treatment  Patient Details  Name: Kristina Pearson MRN: [id number] Date of Birth: [birth year] Referring Provider (PT): Sharyon Medicus, [place]   Encounter Date: 09/16/2020   PT End of Session - 09/16/20 1134     Visit Number 16    Number of Visits 20    Date for PT Re-Evaluation 10/01/20    Authorization Type AETNA    Authorization Time Period FOTO by 16th visit    PT Start Time 1132    PT Stop Time 1215    PT Time Calculation (min) 43 min    Activity Tolerance Patient tolerated treatment well;Patient limited by pain    Behavior During Therapy [facility] for tasks assessed/performed             Past Medical History:  Diagnosis Date   ADHD    ADHD (attention deficit hyperactivity disorder)    ADD   Allergy    Anxiety    Arthritis    osteoarthritrs   Breast cancer ([place]) 08/15/13   right lateral upper outer   Depression    Family history of breast cancer    Family history of colon cancer    Family history of melanoma    Family history of ovarian cancer    Fibromyalgia    GERD (gastroesophageal reflux disease)    TUMS   Headache(784.0)    sees Dr. Gwendel Hanson at [facility]    History of depression    Hx of radiation therapy 10/19/13- 11/09/13   right breast 4256 cGy in 16 sessions, hypo-fractionated   Hypothyroid    Migraine    Osteopenia    Personal history of radiation therapy 2015   PONV (postoperative nausea and vomiting)    Sialoadenitis    Spinal stenosis    Urinary incontinence    Vitiligo    Wears glasses     Past Surgical History:  Procedure Laterality Date   BREAST BIOPSY Right 07/2013   BREAST LUMPECTOMY Right 08/2013   BREAST LUMPECTOMY WITH RADIOACTIVE SEED LOCALIZATION Right 08/30/2013   Procedure: RIGHT PARTIAL MASTECTOMY WITH RADIOACTIVE SEED LOCALIZATION;  Surgeon: [name], MD;  Location: [place];  Service: General;  Laterality: Right;   [place]   lap   COLONOSCOPY  2007   per Dr. Cristina Gong, clear, repeat in 10 yrs   [place]      There were no vitals filed for this visit.   Subjective Assessment - 09/16/20 1134     Subjective I had to take some pain medication today, my surgical pain is really bad. It's been pretty painful all weekend so I've been taking pain medication more frequently this weekend.    Currently in Pain? Yes    Pain Score 6     Pain Location Shoulder    Pain Orientation Right    Pain Descriptors / Indicators Stabbing    Pain Type Surgical pain    Pain Onset More than a month ago    Pain Frequency Intermittent  [place] Adult PT Treatment/Exercise - 09/16/20 0001       Neck Exercises: Machines for Strengthening   UBE (Upper Arm Bike) L1 5 minutes (2.5 fwd/2.5bck)      Shoulder Exercises: Supine   Protraction 12 reps    Protraction Weight (lbs) 1#    Protraction Limitations serratus punches, pillow behind scapula    Flexion 12 reps;Right    Shoulder Flexion Weight (lbs) 1#    Flexion Limitations in available range      Shoulder Exercises: Sidelying   External Rotation 12 reps;10 reps   1 set 12, 1 set of 10   ABduction 12 reps    ABduction Limitations short lever arm      Shoulder Exercises: Standing   ABduction --   8 reps   ABduction Limitations isometric for pain modulation, only told to do at home if increase in pain    Extension 12 reps;Both    Theraband Level (Shoulder Extension) Level 1 (Yellow)    Extension Limitations decreased height of theraband this session    Row 12 reps;Both    Theraband Level (Shoulder Row) Level 1 (Yellow)      Shoulder Exercises: Pulleys   Flexion 1 minute    ABduction --   x8 each  side   ABduction Limitations limited abduction R arm      Manual Therapy   Manual Therapy Passive ROM;Joint mobilization    Joint Mobilization grade 2-3 joint mobs inferior and posterior    Passive ROM R PROM flexion, abduction, and ER                          PT Education - 09/16/20 1136     Education Details HEP update, told to take 1-2 days break off of exercises if pain persists, isometrics for pain control, education on that progression isn't linear,    Person(s) Educated Patient    Methods Explanation;Demonstration    Comprehension Verbalized understanding;Returned demonstration;Need further instruction              PT Short Term Goals - 08/26/20 1404       PT SHORT TERM GOAL #1   Title Patient will be I with initial HEP to progress with PT    Time --    Period Weeks    Status Achieved    Target Date --      PT SHORT TERM GOAL #2   Title Patient will demonstrate PROM >/= 120 deg elevation to reduce shoulder stiffness and progress allowable range    Baseline 128    Time --    Period --    Status Achieved    Target Date --      PT SHORT TERM GOAL #3   Title Patient will report </= 5/10 pain with therapy to reduce limitation and allow progression within protocol    Time --    Period --    Status Achieved    Target Date --      PT SHORT TERM GOAL #4   Title PT will review FOTO with patient by 3rd visit    Status Achieved                PT Long Term Goals - 07/23/20 1323       PT LONG TERM GOAL #1   Title Patient will be I with final HEP to maintain progress from PT    Time 10    Period Weeks  Status New    Target Date 10/01/20      PT LONG TERM GOAL #2   Title Patient will report improved functional status >/= 55% on FOTO    Time 12    Period Weeks    Status New    Target Date 10/15/20      PT LONG TERM GOAL #3   Title Patient will demonstrate right shoulder AROM grossly WFL to return to swimming    Time 10    Period Weeks     Status New    Target Date 10/01/20      PT LONG TERM GOAL #4   Title Patient will demonstrate right shoulder rotator cuff strength >/= 4/5 MMT to be able to lift objects into top cabinets    Time 12    Period Weeks    Status New    Target Date 10/15/20                        Plan - 09/16/20 1155     Clinical Impression Statement Pt tolerated tx well with no adverse effects this session. Pt is just shy of s/p 11 weeks after RTC surgery. No new exercises introduced this session as pt limited by pain that has been going on since this weekend. By end of session, pt had no increase in pain but was still at a 6/10 pain. Education given on taking 1-2 days off exercise so that pain/irritation in her shoulder can decrease. Lever arm decreased on shoulder abduction exercises for HEP to see if it helps with pain. Educated to perform shoulder abduction isometrics and stop all exercises for 1-2 days if pain persists. Continues to benefit from skilled PT in order to increase functional strength and ROM of the L shoulder, as well as postural endurance, in order to return to PLOF and decrease pain.    PT Treatment/Interventions ADLs/Self Care Home Management;Aquatic Therapy;Cryotherapy;Electrical Stimulation;Iontophoresis 4mg /ml Dexamethasone;Moist Heat;Neuromuscular re-education;Therapeutic exercise;Therapeutic activities;Functional mobility training;Patient/family education;Manual techniques;Dry needling;Passive range of motion;Taping;Joint Manipulations    PT Next Visit Plan R arm AROM and AAROM progression from supine to inclined supine PRN, L shoulder strengthening exercise PRN, pulleys, pec minor soft tissue PRN, periscapular strengthening, progress shoulder extension exercises    PT Home Exercise Plan [email or web address]    Consulted and Agree with Plan of Care Patient             Patient will benefit from skilled therapeutic intervention in order to improve the following deficits and impairments:   Decreased range of motion,Postural dysfunction,Decreased strength,Pain,Decreased activity tolerance,Impaired UE functional use  Visit Diagnosis: Chronic right shoulder pain  Stiffness of right shoulder, not elsewhere classified  Muscle weakness (generalized)      Problem List Patient Active Problem List   Diagnosis Date Noted   Genetic testing 10/02/2019   Environmental and seasonal allergies 09/19/2019   Family history of colon cancer    Family history of melanoma    Family history of ovarian cancer    Family history of breast cancer    Anxiety disorder 01/09/2019   Cervical stenosis of spine 10/21/2018   Insomnia 10/21/2018   Breast tenderness in female 02/05/2014   Pes planus of both feet 01/15/2014   Low back pain 11/23/2013   Phlebitis 10/06/2013   History of depression 09/26/2013   Breast cancer of upper-outer quadrant of right female breast ([place]) 08/18/2013   ADHD (attention deficit hyperactivity disorder) 12/01/2011   Headache 04/03/2009   Hypothyroidism 05/05/2007  GERD 05/05/2007   Osteoarthritis 05/05/2007    Caleb Popp, SPT 09/16/2020, 2:06 PM  [facility] [address] [place], [place], [address] Phone: [phone]   Fax:  [phone]  Name: Kristina Pearson MRN: [id number] Date of Birth: 01-27-1956

## 2020-09-18 ENCOUNTER — Telehealth: Payer: Self-pay

## 2020-09-18 NOTE — Telephone Encounter (Signed)
Pt Prior Auth for Vyvanse 50 mg capsule has been approved thru 09/17/2021. Pt was not notified

## 2020-09-19 ENCOUNTER — Ambulatory Visit: Payer: 59 | Admitting: Physical Therapy

## 2020-09-19 ENCOUNTER — Encounter: Payer: Self-pay | Admitting: Physical Therapy

## 2020-09-19 ENCOUNTER — Other Ambulatory Visit: Payer: Self-pay

## 2020-09-19 DIAGNOSIS — M25511 Pain in right shoulder: Secondary | ICD-10-CM | POA: Diagnosis not present

## 2020-09-19 DIAGNOSIS — G8929 Other chronic pain: Secondary | ICD-10-CM

## 2020-09-19 DIAGNOSIS — M25611 Stiffness of right shoulder, not elsewhere classified: Secondary | ICD-10-CM

## 2020-09-19 DIAGNOSIS — M6281 Muscle weakness (generalized): Secondary | ICD-10-CM

## 2020-09-19 NOTE — Patient Instructions (Signed)
Access Code: [email or web address] URL: [email or web address] Date: 09/19/2020 Prepared by: Caleb Popp  Exercises Supine Shoulder Flexion Extension AAROM with Dowel - 2 x daily - 7 x weekly - 2 sets - 5 reps Supine Shoulder External Rotation AAROM with Dowel - 2 x daily - 7 x weekly - 10 reps - 2 sets Supine Suboccipital Release with Tennis Balls Seated Shoulder Flexion Towel Slide at Table Top - 2 x daily - 7 x weekly - 10 reps - 10 seconds hold Seated Scapular Retraction - 2 x daily - 7 x weekly - 10 reps - 5 seconds hold Seated Cervical Retraction - 2-3 x daily - 7 x weekly - 10 reps - 5 seconds hold Standing Shoulder External Rotation Stretch in Doorway - 2 x daily - 7 x weekly - 10 reps - 10 seconds hold Shoulder External Rotation with Anchored Resistance - 1 x daily - 7 x weekly - 3 sets - 10 reps Shoulder Internal Rotation with Resistance - 1 x daily - 7 x weekly - 3 sets - 10 reps Standing Row with Anchored Resistance - 1 x daily - 7 x weekly - 2 sets - 12 reps Shoulder Extension with Resistance - 1 x daily - 7 x weekly - 2 sets - 12 reps Supine Scapular Protraction in Flexion with Dumbbells - 1 x daily - 7 x weekly - 2 sets - 12 reps Sidelying Shoulder External Rotation - 1 x daily - 7 x weekly - 2 sets - 10 reps Sidelying Shoulder Abduction Palm Forward - 1 x daily - 7 x weekly - 2 sets - 10 reps Side to Side Weight Shift with Counter Support - 1 x daily - 7 x weekly - 2 sets - 10 reps

## 2020-09-19 NOTE — Therapy (Addendum)
[place], [place], [address] Phone: [phone]   Fax:  [phone]  Physical Therapy Treatment  Patient Details  Name: Kristina Pearson MRN: [id number] Date of Birth: [birth year] Referring Provider (PT): Clance Boll Gasper Sells, [place]   Encounter Date: 09/19/2020   PT End of Session - 09/19/20 1722     Visit Number 17    Number of Visits 20    Date for PT Re-Evaluation 10/01/20    Authorization Type AETNA    Authorization Time Period FOTO by 16th visit    PT Start Time 1452   pt arrived late   PT Stop Time 1530    PT Time Calculation (min) 38 min    Activity Tolerance Patient tolerated treatment well    Behavior During Therapy [facility] for tasks assessed/performed             Past Medical History:  Diagnosis Date   ADHD    ADHD (attention deficit hyperactivity disorder)    ADD   Allergy    Anxiety    Arthritis    osteoarthritrs   Breast cancer ([place]) 08/15/13   right lateral upper outer   Depression    Family history of breast cancer    Family history of colon cancer    Family history of melanoma    Family history of ovarian cancer    Fibromyalgia    GERD (gastroesophageal reflux disease)    TUMS   Headache(784.0)    sees Dr. Gwendel Hanson at [facility]    History of depression    Hx of radiation therapy 10/19/13- 11/09/13   right breast 4256 cGy in 16 sessions, hypo-fractionated   Hypothyroid    Migraine    Osteopenia    Personal history of radiation therapy 2015   PONV (postoperative nausea and vomiting)    Sialoadenitis    Spinal stenosis    Urinary incontinence    Vitiligo    Wears glasses     Past Surgical History:  Procedure Laterality Date   BREAST BIOPSY Right 07/2013   BREAST LUMPECTOMY Right 08/2013   BREAST LUMPECTOMY WITH RADIOACTIVE SEED LOCALIZATION Right 08/30/2013   Procedure: RIGHT PARTIAL MASTECTOMY WITH RADIOACTIVE SEED LOCALIZATION;  Surgeon: [name], MD;  Location: [facility];  Service: General;  Laterality: Right;   [place]   lap   COLONOSCOPY  2007   per Dr. Cristina Gong, clear, repeat in 10 yrs   [place]      There were no vitals filed for this visit.   Subjective Assessment - 09/19/20 1454     Subjective I slept through the night in my bed for the first time on Monday. I have been sleeping in the bed since Monday. The doctor said my shoulder was looking good.    Currently in Pain? Yes    Pain Score 4     Pain Location Shoulder    Pain Orientation Right    Pain Descriptors / Indicators Stabbing;Aching    Pain Type Surgical pain    Pain Onset More than a month ago    Pain Frequency Intermittent  [facility] Adult PT Treatment/Exercise - 09/19/20 0001       Shoulder Exercises: Supine   Flexion Right;10 reps   2 sets   Shoulder Flexion Weight (lbs) 1#    Flexion Limitations on 45 degree angle incline    Other Supine Exercises dowel exercises for IR: extension, cross body adduction behind the back, and shrugs upx10 each   limited range with extension     Shoulder Exercises: Sidelying   External Rotation 10 reps   2 sets   External Rotation Weight (lbs) 3    ABduction 10 reps   2 sets   ABduction Weight (lbs) 1 and then 2    ABduction Limitations long lever arm      Shoulder Exercises: Standing   Protraction 10 reps;Both   at counter   Protraction Limitations decreased coordination/increased upper trap compensation    Flexion 5 reps    Flexion Limitations to about 100-120    Extension 10 reps    Theraband Level (Shoulder Extension) Level 2 (Red)    Row 10 reps   2 sets   Theraband Level (Shoulder Row) Level 2 (Red)    Other Standing Exercises weight shifts 2x10 midline to R side      Manual Therapy   Manual Therapy  Passive ROM;Joint mobilization    Joint Mobilization grade 2-3 joint mobs inferior and posterior    Passive ROM R PROM flexion, abduction, and ER                          PT Education - 09/19/20 1721     Education Details HEP update    Person(s) Educated Patient    Methods Explanation;Demonstration;Tactile cues;Verbal cues;Handout    Comprehension Verbalized understanding;Returned demonstration;Need further instruction              PT Short Term Goals - 08/26/20 1404       PT SHORT TERM GOAL #1   Title Patient will be I with initial HEP to progress with PT    Time --    Period Weeks    Status Achieved    Target Date --      PT SHORT TERM GOAL #2   Title Patient will demonstrate PROM >/= 120 deg elevation to reduce shoulder stiffness and progress allowable range    Baseline 128    Time --    Period --    Status Achieved    Target Date --      PT SHORT TERM GOAL #3   Title Patient will report </= 5/10 pain with therapy to reduce limitation and allow progression within protocol    Time --    Period --    Status Achieved    Target Date --      PT SHORT TERM GOAL #4   Title PT will review FOTO with patient by 3rd visit    Status Achieved                PT Long Term Goals - 07/23/20 1323       PT LONG TERM GOAL #1   Title Patient will be I with final HEP to maintain progress from PT    Time 10    Period Weeks    Status New    Target Date 10/01/20      PT LONG TERM GOAL #2   Title Patient will report improved functional status >/= 55% on FOTO    Time  12    Period Weeks    Status New    Target Date 10/15/20      PT LONG TERM GOAL #3   Title Patient will demonstrate right shoulder AROM grossly WFL to return to swimming    Time 10    Period Weeks    Status New    Target Date 10/01/20      PT LONG TERM GOAL #4   Title Patient will demonstrate right shoulder rotator cuff strength >/= 4/5 MMT to be able to lift objects into top cabinets     Time 12    Period Weeks    Status New    Target Date 10/15/20                        Plan - 09/19/20 1716     Clinical Impression Statement Pt tolerated tx well with no adverse effects thi session. Pt at s/p 11 weeks after RTC surgery. CKC exercises such as weight shifts and push up pluses introduced this session. AROM flexion standing and at incline introduced and tolerated this session. ER and abduction in sidelying further progressed and worked towards more endurance reps/increasing weight. Periscapular theraband exercises further progressed in weight/theraband resistance this session.Continues to benefit from skilled PT in order to further work on above impairemnts and overhead activity in order to help return to PLOF and decrease pain.    PT Treatment/Interventions ADLs/Self Care Home Management;Aquatic Therapy;Cryotherapy;Electrical Stimulation;Iontophoresis 4mg /ml Dexamethasone;Moist Heat;Neuromuscular re-education;Therapeutic exercise;Therapeutic activities;Functional mobility training;Patient/family education;Manual techniques;Dry needling;Passive range of motion;Taping;Joint Manipulations    PT Next Visit Plan FOTO, R arm AROM and AAROM progression from supine to inclined supine PRN, L shoulder strengthening exercise PRN, pulleys, pec minor soft tissue PRN, periscapular strengthening, progress shoulder extension exercises    PT Home Exercise Plan [email or web address]    Consulted and Agree with Plan of Care Patient             Patient will benefit from skilled therapeutic intervention in order to improve the following deficits and impairments:  Decreased range of motion,Postural dysfunction,Decreased strength,Pain,Decreased activity tolerance,Impaired UE functional use  Visit Diagnosis: Chronic right shoulder pain  Stiffness of right shoulder, not elsewhere classified  Muscle weakness (generalized)      Problem List Patient Active Problem List   Diagnosis Date Noted    Genetic testing 10/02/2019   Environmental and seasonal allergies 09/19/2019   Family history of colon cancer    Family history of melanoma    Family history of ovarian cancer    Family history of breast cancer    Anxiety disorder 01/09/2019   Cervical stenosis of spine 10/21/2018   Insomnia 10/21/2018   Breast tenderness in female 02/05/2014   Pes planus of both feet 01/15/2014   Low back pain 11/23/2013   Phlebitis 10/06/2013   History of depression 09/26/2013   Breast cancer of upper-outer quadrant of right female breast ([place]) 08/18/2013   ADHD (attention deficit hyperactivity disorder) 12/01/2011   Headache 04/03/2009   Hypothyroidism 05/05/2007   GERD 05/05/2007   Osteoarthritis 05/05/2007    Caleb Popp, SPT 09/19/2020, 5:46 PM  [place] [facility] [address] [place], [place], [address] Phone: [phone]   Fax:  [phone]  Name: LAYCI STENGLEIN MRN: [id number] Date of Birth: [birth year]

## 2020-09-20 ENCOUNTER — Ambulatory Visit (INDEPENDENT_AMBULATORY_CARE_PROVIDER_SITE_OTHER): Payer: 59 | Admitting: Family Medicine

## 2020-09-20 ENCOUNTER — Encounter: Payer: Self-pay | Admitting: Family Medicine

## 2020-09-20 VITALS — BP 118/82 | HR 96 | Temp 99.2°F | Wt 162.0 lb

## 2020-09-20 DIAGNOSIS — M4802 Spinal stenosis, cervical region: Secondary | ICD-10-CM | POA: Diagnosis not present
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
## 2020-09-23 ENCOUNTER — Ambulatory Visit: Payer: 59 | Admitting: Physical Therapy

## 2020-09-26 ENCOUNTER — Encounter: Payer: Self-pay | Admitting: Physical Therapy

## 2020-09-26 ENCOUNTER — Other Ambulatory Visit: Payer: Self-pay

## 2020-09-26 ENCOUNTER — Ambulatory Visit: Payer: 59 | Attending: Physician Assistant | Admitting: Physical Therapy

## 2020-09-26 DIAGNOSIS — M25511 Pain in right shoulder: Secondary | ICD-10-CM | POA: Diagnosis not present

## 2020-09-26 DIAGNOSIS — G8929 Other chronic pain: Secondary | ICD-10-CM | POA: Diagnosis present

## 2020-09-26 DIAGNOSIS — M25611 Stiffness of right shoulder, not elsewhere classified: Secondary | ICD-10-CM | POA: Diagnosis present

## 2020-09-26 DIAGNOSIS — M6281 Muscle weakness (generalized): Secondary | ICD-10-CM | POA: Insufficient documentation

## 2020-09-26 NOTE — Patient Instructions (Signed)
Access Code: [email or web address] URL: [email or web address] Date: 09/26/2020 Prepared by: Caleb Popp  Exercises Supine Shoulder Flexion Extension AAROM with Dowel - 2 x daily - 7 x weekly - 2 sets - 5 reps Supine Shoulder External Rotation AAROM with Dowel - 2 x daily - 7 x weekly - 10 reps - 2 sets Shoulder External Rotation with Anchored Resistance - 1 x daily - 7 x weekly - 2 sets - 10 reps Shoulder Internal Rotation with Resistance - 1 x daily - 7 x weekly - 2 sets - 10 reps Standing Row with Anchored Resistance - 1 x daily - 7 x weekly - 2 sets - 12 reps Shoulder Extension with Resistance - 1 x daily - 7 x weekly - 2 sets - 12 reps Supine Scapular Protraction in Flexion with Dumbbells - 1 x daily - 7 x weekly - 2 sets - 12 reps Sidelying Shoulder Abduction Palm Forward - 1 x daily - 7 x weekly - 2 sets - 10 reps Supine Shoulder Horizontal Abduction with Resistance - 1 x daily - 7 x weekly - 2 sets - 12 reps Standing Shoulder Extension with Dowel - 1 x daily - 7 x weekly - 10 reps Standing Shoulder Internal Rotation AAROM Behind Back with Towel - 1 x daily - 7 x weekly - 10 reps Standing Bilateral Shoulder Internal Rotation AAROM with Dowel - 1 x daily - 7 x weekly - 10 reps Supine Shoulder Flexion with Free Weight - 1 x daily - 7 x weekly - 2 sets - 12 reps

## 2020-09-26 NOTE — Therapy (Addendum)
[place], [place], [address] Phone: [phone]   Fax:  [phone]  Physical Therapy Treatment  Patient Details  Name: Kristina Pearson MRN: [id number] Date of Birth: March 08, 1956 Referring Provider (PT): Currence, Gasper Sells, [place]   Encounter Date: 09/26/2020   PT End of Session - 09/26/20 1447     Visit Number 18    Number of Visits 20    Date for PT Re-Evaluation 10/01/20    Authorization Type AETNA    Authorization Time Period FOTO by 16th visit    PT Start Time 1402    PT Stop Time 1446    PT Time Calculation (min) 44 min    Activity Tolerance Patient tolerated treatment well    Behavior During Therapy [facility] for tasks assessed/performed             Past Medical History:  Diagnosis Date   ADHD    ADHD (attention deficit hyperactivity disorder)    ADD   Allergy    Anxiety    Arthritis    osteoarthritrs   Breast cancer ([place]) 08/15/13   right lateral upper outer   Depression    Family history of breast cancer    Family history of colon cancer    Family history of melanoma    Family history of ovarian cancer    Fibromyalgia    GERD (gastroesophageal reflux disease)    TUMS   Headache(784.0)    sees Dr. Gwendel Hanson at [facility]    History of depression    Hx of radiation therapy 10/19/13- 11/09/13   right breast 4256 cGy in 16 sessions, hypo-fractionated   Hypothyroid    Migraine    Osteopenia    Personal history of radiation therapy 2015   PONV (postoperative nausea and vomiting)    Sialoadenitis    Spinal stenosis    Urinary incontinence    Vitiligo    Wears glasses     Past Surgical History:  Procedure Laterality Date   BREAST BIOPSY Right 07/2013   BREAST LUMPECTOMY Right 08/2013   BREAST LUMPECTOMY WITH RADIOACTIVE SEED LOCALIZATION Right 08/30/2013   Procedure: RIGHT PARTIAL MASTECTOMY WITH RADIOACTIVE SEED LOCALIZATION;  Surgeon: [name], MD;  Location:  [facility];  Service: General;  Laterality: Right;   [place]   lap   COLONOSCOPY  2007   per Dr. Cristina Gong, clear, repeat in 10 yrs   [place]      There were no vitals filed for this visit.   Subjective Assessment - 09/26/20 1405     Subjective I was pretty sick and I felt pretty bad. There was about 2 days where I didn't do my exercises last week. I feel the shoulder but it isn't keeping it    Currently in Pain? Yes    Pain Score 2     Pain Orientation Right    Pain Descriptors / Indicators Dull   just there   Pain Type Surgical pain    Pain Onset More than a month ago    Pain Frequency Intermittent  [facility] Adult PT Treatment/Exercise - 09/26/20 0001       Shoulder Exercises: Supine   Protraction 10 reps    Protraction Weight (lbs) 2#    Protraction Limitations serratus punches, pillow behind scapula    Horizontal ABduction 10 reps;Both   2 sets   Flexion Right;12 reps   2 sets   Shoulder Flexion Weight (lbs) 2# and 3#    Flexion Limitations on 45 degree angle incline    Other Supine Exercises dowel exercises for IR: extension, cross body adduction behind the back, and shrugs upx10 each      Shoulder Exercises: Sidelying   ABduction 12 reps    ABduction Weight (lbs) 2    ABduction Limitations long lever arm      Shoulder Exercises: Standing   Row 10 reps   2 sets   Theraband Level (Shoulder Row) Level 2 (Red)      Manual Therapy   Manual Therapy Passive ROM;Joint mobilization    Joint Mobilization grade 2-3 joint mobs inferior and posterior    Passive ROM R PROM flexion, abduction, and ER                          PT Education - 09/26/20 1549     Education Details HEP update    Person(s) Educated Patient    Methods Explanation;Demonstration;Handout     Comprehension Verbalized understanding;Returned demonstration;Need further instruction              PT Short Term Goals - 08/26/20 1404       PT SHORT TERM GOAL #1   Title Patient will be I with initial HEP to progress with PT    Time --    Period Weeks    Status Achieved    Target Date --      PT SHORT TERM GOAL #2   Title Patient will demonstrate PROM >/= 120 deg elevation to reduce shoulder stiffness and progress allowable range    Baseline 128    Time --    Period --    Status Achieved    Target Date --      PT SHORT TERM GOAL #3   Title Patient will report </= 5/10 pain with therapy to reduce limitation and allow progression within protocol    Time --    Period --    Status Achieved    Target Date --      PT SHORT TERM GOAL #4   Title PT will review FOTO with patient by 3rd visit    Status Achieved                PT Long Term Goals - 07/23/20 1323       PT LONG TERM GOAL #1   Title Patient will be I with final HEP to maintain progress from PT    Time 10    Period Weeks    Status New    Target Date 10/01/20      PT LONG TERM GOAL #2   Title Patient will report improved functional status >/= 55% on FOTO    Time 12    Period Weeks    Status New    Target Date 10/15/20      PT LONG TERM GOAL #3   Title Patient will demonstrate right shoulder AROM grossly WFL to return to swimming    Time 10    Period Weeks    Status New  Target Date 10/01/20      PT LONG TERM GOAL #4   Title Patient will demonstrate right shoulder rotator cuff strength >/= 4/5 MMT to be able to lift objects into top cabinets    Time 12    Period Weeks    Status New    Target Date 10/15/20                        Plan - 09/26/20 1551     Clinical Impression Statement Pt tolerated tx well with no adverse effects this session. Pt is at s/p 12 weeks after RTC surgery. Further periscapular strengthening performed this session with increasing gravity resistace,  weight, and increasing theraband resistance. Discussed 1x/week and pt agreeable as long as she continues to progress and understands her exercises. COntinues to benefit from skilled PT in order to address overhead mobility deficits and periscapular strength in order to return to PLOF.    PT Treatment/Interventions ADLs/Self Care Home Management;Aquatic Therapy;Cryotherapy;Electrical Stimulation;Iontophoresis 4mg /ml Dexamethasone;Moist Heat;Neuromuscular re-education;Therapeutic exercise;Therapeutic activities;Functional mobility training;Patient/family education;Manual techniques;Dry needling;Passive range of motion;Taping;Joint Manipulations    PT Next Visit Plan FOTO, R arm AROM and AAROM progression from supine to inclined supine PRN, L shoulder strengthening exercise PRN, pulleys, pec minor soft tissue PRN, periscapular strengthening, progress shoulder extension exercises    PT Home Exercise Plan [email or web address]    Consulted and Agree with Plan of Care Patient             Patient will benefit from skilled therapeutic intervention in order to improve the following deficits and impairments:  Decreased range of motion,Postural dysfunction,Decreased strength,Pain,Decreased activity tolerance,Impaired UE functional use  Visit Diagnosis: Chronic right shoulder pain  Stiffness of right shoulder, not elsewhere classified  Muscle weakness (generalized)      Problem List Patient Active Problem List   Diagnosis Date Noted   Genetic testing 10/02/2019   Environmental and seasonal allergies 09/19/2019   Family history of colon cancer    Family history of melanoma    Family history of ovarian cancer    Family history of breast cancer    Anxiety disorder 01/09/2019   Cervical stenosis of spine 10/21/2018   Insomnia 10/21/2018   Breast tenderness in female 02/05/2014   Pes planus of both feet 01/15/2014   Low back pain 11/23/2013   Phlebitis 10/06/2013   History of depression 09/26/2013    Breast cancer of upper-outer quadrant of right female breast ([place]) 08/18/2013   ADHD (attention deficit hyperactivity disorder) 12/01/2011   Headache 04/03/2009   Hypothyroidism 05/05/2007   GERD 05/05/2007   Osteoarthritis 05/05/2007    Caleb Popp, SPT 09/26/2020, 3:55 PM  [place] [facility] [address] [place], [place], [address] Phone: [phone]   Fax:  [phone]  Name: ELLENIE SALOME MRN: [id number] Date of Birth: 17-Dec-1955

## 2020-09-30 ENCOUNTER — Other Ambulatory Visit: Payer: Self-pay

## 2020-09-30 ENCOUNTER — Encounter: Payer: Self-pay | Admitting: Physical Therapy

## 2020-09-30 ENCOUNTER — Ambulatory Visit: Payer: 59 | Admitting: Physical Therapy

## 2020-09-30 DIAGNOSIS — M6281 Muscle weakness (generalized): Secondary | ICD-10-CM

## 2020-09-30 DIAGNOSIS — M25511 Pain in right shoulder: Secondary | ICD-10-CM | POA: Diagnosis not present

## 2020-09-30 DIAGNOSIS — G8929 Other chronic pain: Secondary | ICD-10-CM

## 2020-09-30 DIAGNOSIS — M25611 Stiffness of right shoulder, not elsewhere classified: Secondary | ICD-10-CM

## 2020-09-30 NOTE — Therapy (Addendum)
Kristina Pearson, [place], [address] Phone: [phone]   Fax:  [phone]  Physical Therapy Treatment / ERO  Patient Details  Name: Kristina Pearson MRN: [id number] Date of Birth: 01/18/1956 Referring Provider (PT): Sharyon Medicus, [place]   Encounter Date: 09/30/2020   PT End of Session - 09/30/20 1218     Visit Number 19    Number of Visits 27    Date for PT Re-Evaluation 11/18/20    Authorization Type AETNA    Authorization Time Period FOTO by 16th visit    PT Start Time 1217    PT Stop Time 1302    PT Time Calculation (min) 45 min    Activity Tolerance Patient tolerated treatment well    Behavior During Therapy [facility] for tasks assessed/performed             Past Medical History:  Diagnosis Date   ADHD    ADHD (attention deficit hyperactivity disorder)    ADD   Allergy    Anxiety    Arthritis    osteoarthritrs   Breast cancer ([place]) 08/15/13   right lateral upper outer   Depression    Family history of breast cancer    Family history of colon cancer    Family history of melanoma    Family history of ovarian cancer    Fibromyalgia    GERD (gastroesophageal reflux disease)    TUMS   Headache(784.0)    sees Dr. Gwendel Hanson at [facility]    History of depression    Hx of radiation therapy 10/19/13- 11/09/13   right breast 4256 cGy in 16 sessions, hypo-fractionated   Hypothyroid    Migraine    Osteopenia    Personal history of radiation therapy 2015   PONV (postoperative nausea and vomiting)    Sialoadenitis    Spinal stenosis    Urinary incontinence    Vitiligo    Wears glasses     Past Surgical History:  Procedure Laterality Date   BREAST BIOPSY Right 07/2013   BREAST LUMPECTOMY Right 08/2013   BREAST LUMPECTOMY WITH RADIOACTIVE SEED LOCALIZATION Right 08/30/2013   Procedure: RIGHT PARTIAL MASTECTOMY WITH RADIOACTIVE SEED LOCALIZATION;  Surgeon: [name], MD;   Location: [place];  Service: General;  Laterality: Right;   [place]   lap   COLONOSCOPY  2007   per Dr. Cristina Gong, clear, repeat in 10 yrs   [place]      There were no vitals filed for this visit.   Subjective Assessment - 09/30/20 1218     Subjective One night this week I had bad pain but refused to take my pain medicine. I only got to sleep 2 hours. It was the pain that went down my arms and legs. I can push on the hsoulder when i get out of bed. I have the MRI on the 20th.    Patient Stated Goals Get full use of right arm and recondition her body    Currently in Pain? Yes    Pain Score 5     Pain Location Shoulder    Pain Orientation Right    Pain Descriptors / Indicators Aching    Pain Type Surgical pain    Pain Onset More than a month ago    Pain Frequency Intermittent  [facility] PT Assessment - 09/30/20 0001       Assessment   Medical Diagnosis Right rotator cuff repair    Referring Provider (PT) Currence, Gasper Sells, PA-C    Onset Date/Surgical Date 07/03/20      Precautions   Precautions None      Restrictions   Weight Bearing Restrictions No      Prior Function   Level of Independence Independent      Observation/Other Assessments   Focus on Therapeutic Outcomes (FOTO)  49% functional status (predicted 55%)      ROM / Strength   AROM / PROM / Strength AROM;Strength;PROM      AROM   AROM Assessment Site Shoulder    Right/Left Shoulder Right    Right Shoulder Flexion 127 Degrees    Right Shoulder ABduction 113 Degrees    Right Shoulder External Rotation 44 Degrees   @ 0 degrees abduction     PROM   Right Shoulder Flexion 132 Degrees    Right Shoulder ABduction 85 Degrees    Right Shoulder External Rotation 40 Degrees   scaption     Strength   Strength Assessment Site Shoulder    Right/Left Shoulder  Right    Right Shoulder Flexion 4-/5   painful   Right Shoulder Extension 4+/5    Right Shoulder ABduction 4-/5   painful   Right Shoulder Internal Rotation 4+/5    Right Shoulder External Rotation 4-/5   painful                                       [facility] Adult PT Treatment/Exercise - 09/30/20 0001       Self-Care   Self-Care Other Self-Care Comments    Other Self-Care Comments  FOTO, LTG assessment, progress discussion      Shoulder Exercises: Supine   Protraction 10 reps   2 sets   Protraction Weight (lbs) 3#    Protraction Limitations serratus punches, pillow behind scapula      Shoulder Exercises: Sidelying   ABduction 10 reps   2 sets   ABduction Weight (lbs) 2    ABduction Limitations long lever arm      Shoulder Exercises: Standing   External Rotation 10 reps   2 reps   Theraband Level (Shoulder External Rotation) Level 2 (Red)    Internal Rotation 10 reps   2 sets   Theraband Level (Shoulder Internal Rotation) Level 2 (Red)    Other Standing Exercises 1# dumbell into cabinet first shelf x10, second shelf 1# x8 (became painful)      Shoulder Exercises: ROM/Strengthening   UBE (Upper Arm Bike) UBE L2 4 min (93fwd/2ck)      Manual Therapy   Manual Therapy Passive ROM    Passive ROM R PROM flexion, abduction, and ER                          PT Education - 09/30/20 1310     Education Details POC and HEP update    Person(s) Educated Patient    Methods Explanation;Demonstration;Handout    Comprehension Verbalized understanding;Returned demonstration              PT Short Term Goals - 08/26/20 1404       PT SHORT TERM GOAL #1   Title Patient will be I with initial HEP to progress with PT  Time --    Period Weeks    Status Achieved    Target Date --      PT SHORT TERM GOAL #2   Title Patient will demonstrate PROM >/= 120 deg elevation to reduce shoulder stiffness and progress allowable range    Baseline 128    Time --     Period --    Status Achieved    Target Date --      PT SHORT TERM GOAL #3   Title Patient will report </= 5/10 pain with therapy to reduce limitation and allow progression within protocol    Time --    Period --    Status Achieved    Target Date --      PT SHORT TERM GOAL #4   Title PT will review FOTO with patient by 3rd visit    Status Achieved                PT Long Term Goals - 09/30/20 1321       PT LONG TERM GOAL #1   Title Patient will be I with final HEP to maintain progress from PT    Baseline final HEP not given yet    Time 7    Period Weeks    Status On-going    Target Date 11/18/20      PT LONG TERM GOAL #2   Title Patient will report improved functional status >/= 55% on FOTO    Baseline 49% functional status, predicted 55%    Time 7    Period Weeks    Status On-going    Target Date 11/18/20      PT LONG TERM GOAL #3   Title Patient will demonstrate right shoulder AROM grossly WFL to return to swimming    Baseline 127 flexion, abd 113, 44 ER    Time 7    Period Weeks    Status On-going    Target Date 11/18/20      PT LONG TERM GOAL #4   Title Patient will demonstrate right shoulder rotator cuff strength >/= 4/5 MMT to be able to lift objects into top cabinets    Baseline 4- to 4+    Time 7    Period Weeks    Status On-going    Target Date 11/18/20                        Plan - 09/30/20 1307     Clinical Impression Statement Pt tolerated tx well with no adverse effects this session. Pt has progressed with all PROM and AROM. Periscapular strength testing done this session for the first time and pts strength ranged from 4- to 4+. FOTO has improved also this session. Overhead mobility progressed to cabinet reaches this session with good tolerance. Shows good tolerance with sidelying/supine exercises, so will progress to all functional overhead mobility next session PRN. POC extended to 7 weeks to further work on overhead strengthening in  order to return to PLOF and help increase overall functional use of arm.    Personal Factors and Comorbidities Past/Current Experience;Comorbidity 3+    Comorbidities History of chronic neck pain, anxiety, depression, breast cancer    Examination-Activity Limitations Bathing;Carry;Dressing;Hygiene/Grooming;Lift;Sleep;Reach Overhead    Examination-Participation Restrictions Meal Prep;Cleaning;Driving;Shop;Yard Work;Laundry    Stability/Clinical Decision Making --    Clinical Decision Making --    Rehab Potential --    PT Frequency 1x / week    PT Duration  6 weeks    PT Treatment/Interventions ADLs/Self Care Home Management;Aquatic Therapy;Cryotherapy;Electrical Stimulation;Iontophoresis 4mg /ml Dexamethasone;Moist Heat;Neuromuscular re-education;Therapeutic exercise;Therapeutic activities;Functional mobility training;Patient/family education;Manual techniques;Dry needling;Passive range of motion;Taping;Joint Manipulations    PT Next Visit Plan ER at 90 degrees abduction PROM, standing abduction strengthening, overhead strengthening, PROM with stretching skin over ribs below armpit (fascial restriction due to breast cancer), periscapular strengthening, R arm AROM and AAROM progression from supine to inclined supine PRN, L shoulder strengthening exercise PRN, pulleys, pec minor soft tissue PRN, periscapular strengthening, progress shoulder extension exercises    PT Home Exercise Plan [email or web address]    Consulted and Agree with Plan of Care Patient             Patient will benefit from skilled therapeutic intervention in order to improve the following deficits and impairments:  Decreased range of motion,Postural dysfunction,Decreased strength,Pain,Decreased activity tolerance,Impaired UE functional use  Visit Diagnosis: Chronic right shoulder pain  Stiffness of right shoulder, not elsewhere classified  Muscle weakness (generalized)      Problem List Patient Active Problem List   Diagnosis Date  Noted   Genetic testing 10/02/2019   Environmental and seasonal allergies 09/19/2019   Family history of colon cancer    Family history of melanoma    Family history of ovarian cancer    Family history of breast cancer    Anxiety disorder 01/09/2019   Cervical stenosis of spine 10/21/2018   Insomnia 10/21/2018   Breast tenderness in female 02/05/2014   Pes planus of both feet 01/15/2014   Low back pain 11/23/2013   Phlebitis 10/06/2013   History of depression 09/26/2013   Breast cancer of upper-outer quadrant of right female breast ([place]) 08/18/2013   ADHD (attention deficit hyperactivity disorder) 12/01/2011   Headache 04/03/2009   Hypothyroidism 05/05/2007   GERD 05/05/2007   Osteoarthritis 05/05/2007    Caleb Popp, SPT 09/30/2020, 2:18 PM  [facility] [address] [place], [place], [address] Phone: [phone]   Fax:  [phone]  Name: Kristina Pearson MRN: [id number] Date of Birth: 1956-04-20

## 2020-09-30 NOTE — Patient Instructions (Signed)
Access Code: [email or web address] URL: [email or web address] Date: 09/30/2020 Prepared by: Caleb Popp  Exercises Supine Shoulder Flexion Extension AAROM with Dowel - 2 x daily - 7 x weekly - 2 sets - 5 reps Supine Shoulder External Rotation AAROM with Dowel - 2 x daily - 7 x weekly - 10 reps - 2 sets Shoulder External Rotation with Anchored Resistance - 1 x daily - 7 x weekly - 2 sets - 10 reps Shoulder Internal Rotation with Resistance - 1 x daily - 7 x weekly - 2 sets - 10 reps Standing Row with Anchored Resistance - 1 x daily - 7 x weekly - 2 sets - 12 reps Shoulder Extension with Resistance - 1 x daily - 7 x weekly - 2 sets - 12 reps Supine Scapular Protraction in Flexion with Dumbbells - 1 x daily - 7 x weekly - 2 sets - 12 reps Sidelying Shoulder Abduction Palm Forward - 1 x daily - 7 x weekly - 2 sets - 10 reps Supine Shoulder Horizontal Abduction with Resistance - 1 x daily - 7 x weekly - 2 sets - 12 reps Standing Shoulder Extension with Dowel - 1 x daily - 7 x weekly - 10 reps Standing Shoulder Internal Rotation AAROM Behind Back with Towel - 1 x daily - 7 x weekly - 10 reps Standing Bilateral Shoulder Internal Rotation AAROM with Dowel - 1 x daily - 7 x weekly - 10 reps Supine Shoulder Flexion with Free Weight - 1 x daily - 7 x weekly - 2 sets - 12 reps Single Arm Shoulder Flexion with Dumbbell - 1 x daily - 7 x weekly - 2 sets - 10 reps

## 2020-10-03 ENCOUNTER — Encounter: Payer: 59 | Admitting: Physical Therapy

## 2020-10-09 ENCOUNTER — Ambulatory Visit
Admission: RE | Admit: 2020-10-09 | Discharge: 2020-10-09 | Disposition: A | Discharge status: Home / Self Care | Payer: 59 | Source: Ambulatory Visit | Attending: Family Medicine | Admitting: Family Medicine

## 2020-10-09 ENCOUNTER — Other Ambulatory Visit: Payer: Self-pay

## 2020-10-09 DIAGNOSIS — M4802 Spinal stenosis, cervical region: Secondary | ICD-10-CM

## 2020-10-11 ENCOUNTER — Encounter: Payer: Self-pay | Admitting: Family Medicine

## 2020-10-11 ENCOUNTER — Other Ambulatory Visit: Payer: Self-pay

## 2020-10-11 ENCOUNTER — Ambulatory Visit (INDEPENDENT_AMBULATORY_CARE_PROVIDER_SITE_OTHER): Payer: 59 | Admitting: Family Medicine

## 2020-10-11 VITALS — BP 120/76 | HR 102 | Temp 98.4°F | Wt 164.0 lb

## 2020-10-11 DIAGNOSIS — E039 Hypothyroidism, unspecified: Secondary | ICD-10-CM

## 2020-10-11 DIAGNOSIS — M4802 Spinal stenosis, cervical region: Secondary | ICD-10-CM | POA: Diagnosis not present

## 2020-10-11 DIAGNOSIS — E559 Vitamin D deficiency, unspecified: Secondary | ICD-10-CM

## 2020-10-11 DIAGNOSIS — R739 Hyperglycemia, unspecified: Secondary | ICD-10-CM

## 2020-10-11 NOTE — Progress Notes (Signed)
   Subjective:    Patient ID: Kristina Pearson, female    DOB: [birth year], 65 y.o.   MRN: [id number]  HPI Here to discuss the results of the cervical spine MRI she had done recently. This showed spinal stenosis at the C5-C6 level. She still has chronic pain in the neck and in both arms and both legs. She also has numbness and tingling in both hands and both feet. She had seen Dr. Vertell Limber in the past but she would like to have another opinion.    Review of Systems  Constitutional: Negative.   Respiratory: Negative.   Cardiovascular: Negative.   Musculoskeletal: Positive for neck pain.  Neurological: Positive for numbness.       Objective:   Physical Exam Constitutional:      Appearance: Normal appearance.  Cardiovascular:     Rate and Rhythm: Normal rate and regular rhythm.     Pulses: Normal pulses.     Heart sounds: Normal heart sounds.  Pulmonary:     Effort: Pulmonary effort is normal.     Breath sounds: Normal breath sounds.  Neurological:     Mental Status: She is alert.           Assessment & Plan:  Cervical spinal stenosis. We will refer her to see Dr. Verlin Dike for this.  [name], MD

## 2020-10-14 ENCOUNTER — Encounter: Payer: Self-pay | Admitting: Physical Therapy

## 2020-10-14 ENCOUNTER — Other Ambulatory Visit: Payer: Self-pay

## 2020-10-14 ENCOUNTER — Ambulatory Visit: Payer: 59 | Admitting: Physical Therapy

## 2020-10-14 DIAGNOSIS — M25611 Stiffness of right shoulder, not elsewhere classified: Secondary | ICD-10-CM

## 2020-10-14 DIAGNOSIS — M6281 Muscle weakness (generalized): Secondary | ICD-10-CM

## 2020-10-14 DIAGNOSIS — G8929 Other chronic pain: Secondary | ICD-10-CM

## 2020-10-14 DIAGNOSIS — M25511 Pain in right shoulder: Secondary | ICD-10-CM | POA: Diagnosis not present

## 2020-10-14 NOTE — Therapy (Addendum)
[place], [place], [address] Phone: [phone]   Fax:  [phone]  Physical Therapy Treatment  Patient Details  Name: Kristina Pearson MRN: [id number] Date of Birth: 1956-05-01 Referring Provider (PT): Sharyon Medicus, [place]   Encounter Date: 10/14/2020   PT End of Session - 10/14/20 1543     Visit Number 20    Number of Visits 27    Date for PT Re-Evaluation 11/18/20    Authorization Type AETNA    Authorization Time Period FOTO by 16th visit    PT Start Time 1541    PT Stop Time 1620    PT Time Calculation (min) 39 min    Activity Tolerance Patient tolerated treatment well    Behavior During Therapy [facility] for tasks assessed/performed             Past Medical History:  Diagnosis Date   ADHD    ADHD (attention deficit hyperactivity disorder)    ADD   Allergy    Anxiety    Arthritis    osteoarthritrs   Breast cancer ([place]) 08/15/13   right lateral upper outer   Depression    Family history of breast cancer    Family history of colon cancer    Family history of melanoma    Family history of ovarian cancer    Fibromyalgia    GERD (gastroesophageal reflux disease)    TUMS   Headache(784.0)    sees Dr. Gwendel Hanson at [facility]    History of depression    Hx of radiation therapy 10/19/13- 11/09/13   right breast 4256 cGy in 16 sessions, hypo-fractionated   Hypothyroid    Migraine    Osteopenia    Personal history of radiation therapy 2015   PONV (postoperative nausea and vomiting)    Sialoadenitis    Spinal stenosis    Urinary incontinence    Vitiligo    Wears glasses     Past Surgical History:  Procedure Laterality Date   BREAST BIOPSY Right 07/2013   BREAST LUMPECTOMY Right 08/2013   BREAST LUMPECTOMY WITH RADIOACTIVE SEED LOCALIZATION Right 08/30/2013   Procedure: RIGHT PARTIAL MASTECTOMY WITH RADIOACTIVE SEED LOCALIZATION;  Surgeon: [name], MD;  Location:  [place];  Service: General;  Laterality: Right;   [place]   lap   COLONOSCOPY  2007   per Dr. Cristina Gong, clear, repeat in 10 yrs   [place]      There were no vitals filed for this visit.   Subjective Assessment - 10/14/20 1542     Subjective Doctor is concerned about potential nerve damage. He referred me to a spine specialist. I keep waking up due to the neck pain, sometimes I wake up crying. Reports LLE numbness/tingling today that has been happening occasionally since accident.    Currently in Pain? Yes    Pain Score 2     Pain Location Shoulder    Pain Orientation Right    Multiple Pain Sites Yes    Pain Score 7    Pain Location Neck    Pain Descriptors / Indicators Dull    Pain Onset More than a month ago    Pain Frequency Constant                  [facility] PT  Assessment - 10/14/20 0001       AROM   Right Shoulder Flexion 147 Degrees   with 1# weight in hand   Right Shoulder Internal Rotation --   L2 after dowel exercises                                       [facility] Adult PT Treatment/Exercise - 10/14/20 0001       Neck Exercises: Machines for Strengthening   UBE (Upper Arm Bike) L2 5 minutes (2.5 fwd/2.5bck)      Shoulder Exercises: Supine   Protraction 10 reps    Protraction Weight (lbs) 3#    Protraction Limitations serratus punches, pillow behind scapula    Horizontal ABduction 12 reps;Both      Shoulder Exercises: Standing   External Rotation 12 reps   2 sets   Theraband Level (Shoulder External Rotation) Level 2 (Red)    Internal Rotation 12 reps   2 sets   Theraband Level (Shoulder Internal Rotation) Level 2 (Red)    Flexion 12 reps   2 sets   Shoulder Flexion Weight (lbs) 2    Extension 12 reps   2 sets   Theraband Level (Shoulder Extension) Level 2 (Red)    Row 12 reps   2 sets   Theraband Level  (Shoulder Row) Level 2 (Red)    Other Standing Exercises dowel extension, shrug,and cross body adduction with dowel x12 each                          PT Education - 10/14/20 1747     Education Details HEP update    Person(s) Educated Patient    Methods Explanation;Demonstration    Comprehension Verbalized understanding;Returned demonstration              PT Short Term Goals - 08/26/20 1404       PT SHORT TERM GOAL #1   Title Patient will be I with initial HEP to progress with PT    Time --    Period Weeks    Status Achieved    Target Date --      PT SHORT TERM GOAL #2   Title Patient will demonstrate PROM >/= 120 deg elevation to reduce shoulder stiffness and progress allowable range    Baseline 128    Time --    Period --    Status Achieved    Target Date --      PT SHORT TERM GOAL #3   Title Patient will report </= 5/10 pain with therapy to reduce limitation and allow progression within protocol    Time --    Period --    Status Achieved    Target Date --      PT SHORT TERM GOAL #4   Title PT will review FOTO with patient by 3rd visit    Status Achieved                PT Long Term Goals - 09/30/20 1321       PT LONG TERM GOAL #1   Title Patient will be I with final HEP to maintain progress from PT    Baseline final HEP not given yet    Time 7    Period Weeks    Status On-going    Target Date 11/18/20  PT LONG TERM GOAL #2   Title Patient will report improved functional status >/= 55% on FOTO    Baseline 49% functional status, predicted 55%    Time 7    Period Weeks    Status On-going    Target Date 11/18/20      PT LONG TERM GOAL #3   Title Patient will demonstrate right shoulder AROM grossly WFL to return to swimming    Baseline 127 flexion, abd 113, 44 ER    Time 7    Period Weeks    Status On-going    Target Date 11/18/20      PT LONG TERM GOAL #4   Title Patient will demonstrate right shoulder rotator cuff strength  >/= 4/5 MMT to be able to lift objects into top cabinets    Baseline 4- to 4+    Time 7    Period Weeks    Status On-going    Target Date 11/18/20                        Plan - 10/14/20 1747     Clinical Impression Statement Pt tolerated tx well with no adverse effects this session. Pt is at 147 degrees of overhead L shoulder AROM with 1# dumbell in hand and pt also has functional IR to L2. Further periscapular strenghtening performed this session with continued cueing to keep her neck/upper traps relaxed. Pt progressed from 45 degree incline to full standing shoulder flexion with no increase in pain. Continues to benefit from skilled PT in order to address further periscapular strength deficits and progress overhead mobility in order to return to PLOF and decrease pain.    PT Treatment/Interventions ADLs/Self Care Home Management;Aquatic Therapy;Cryotherapy;Electrical Stimulation;Iontophoresis 4mg /ml Dexamethasone;Moist Heat;Neuromuscular re-education;Therapeutic exercise;Therapeutic activities;Functional mobility training;Patient/family education;Manual techniques;Dry needling;Passive range of motion;Taping;Joint Manipulations    PT Next Visit Plan consolidate HEP (take out ROM exercises), ER at 90 degrees abduction PROM, standing abduction strengthening, overhead strengthening, periscapular strengthening,    PT Home Exercise Plan [email or web address]    Consulted and Agree with Plan of Care Patient             Patient will benefit from skilled therapeutic intervention in order to improve the following deficits and impairments:  Decreased range of motion,Postural dysfunction,Decreased strength,Pain,Decreased activity tolerance,Impaired UE functional use  Visit Diagnosis: Chronic right shoulder pain  Stiffness of right shoulder, not elsewhere classified  Muscle weakness (generalized)      Problem List Patient Active Problem List   Diagnosis Date Noted   Genetic testing 10/02/2019    Environmental and seasonal allergies 09/19/2019   Family history of colon cancer    Family history of melanoma    Family history of ovarian cancer    Family history of breast cancer    Anxiety disorder 01/09/2019   Cervical stenosis of spine 10/21/2018   Insomnia 10/21/2018   Breast tenderness in female 02/05/2014   Pes planus of both feet 01/15/2014   Low back pain 11/23/2013   Phlebitis 10/06/2013   History of depression 09/26/2013   Breast cancer of upper-outer quadrant of right female breast ([place]) 08/18/2013   ADHD (attention deficit hyperactivity disorder) 12/01/2011   Headache 04/03/2009   Hypothyroidism 05/05/2007   GERD 05/05/2007   Osteoarthritis 05/05/2007    Caleb Popp, SPT 10/14/2020, 5:55 PM  [facility] [address] [place], [place], [address] Phone: [phone]   Fax:  [phone]  Name: Desirey Keahey Longnecker  MRN: [id number] Date of Birth: 12-Jan-1956

## 2020-10-14 NOTE — Patient Instructions (Signed)
Access Code: [email or web address] URL: [email or web address] Date: 10/14/2020 Prepared by: Caleb Popp  Exercises Supine Shoulder Flexion Extension AAROM with Dowel - 2 x daily - 7 x weekly - 2 sets - 5 reps Supine Shoulder External Rotation AAROM with Dowel - 2 x daily - 7 x weekly - 10 reps - 2 sets Shoulder External Rotation with Anchored Resistance - 1 x daily - 7 x weekly - 2 sets - 10 reps Shoulder Internal Rotation with Resistance - 1 x daily - 7 x weekly - 2 sets - 10 reps Standing Row with Anchored Resistance - 1 x daily - 7 x weekly - 2 sets - 12 reps Shoulder Extension with Resistance - 1 x daily - 7 x weekly - 2 sets - 12 reps Supine Scapular Protraction in Flexion with Dumbbells - 1 x daily - 7 x weekly - 2 sets - 12 reps Sidelying Shoulder Abduction Palm Forward - 1 x daily - 7 x weekly - 2 sets - 10 reps Supine Shoulder Horizontal Abduction with Resistance - 1 x daily - 7 x weekly - 2 sets - 12 reps Standing Shoulder Extension with Dowel - 1 x daily - 7 x weekly - 10 reps Standing Shoulder Internal Rotation AAROM Behind Back with Towel - 1 x daily - 7 x weekly - 10 reps Standing Bilateral Shoulder Internal Rotation AAROM with Dowel - 1 x daily - 7 x weekly - 10 reps Supine Shoulder Flexion with Free Weight - 1 x daily - 7 x weekly - 2 sets - 12 reps Single Arm Shoulder Flexion with Dumbbell - 1 x daily - 7 x weekly - 2 sets - 10 reps

## 2020-10-16 ENCOUNTER — Other Ambulatory Visit: Payer: 59

## 2020-10-17 ENCOUNTER — Other Ambulatory Visit: Payer: Self-pay

## 2020-10-17 ENCOUNTER — Other Ambulatory Visit (INDEPENDENT_AMBULATORY_CARE_PROVIDER_SITE_OTHER): Payer: 59

## 2020-10-17 DIAGNOSIS — R739 Hyperglycemia, unspecified: Secondary | ICD-10-CM

## 2020-10-17 DIAGNOSIS — E039 Hypothyroidism, unspecified: Secondary | ICD-10-CM | POA: Diagnosis not present

## 2020-10-17 DIAGNOSIS — E559 Vitamin D deficiency, unspecified: Secondary | ICD-10-CM

## 2020-10-17 LAB — CBC WITH DIFFERENTIAL/PLATELET
Basophils Absolute: 0.1 10*3/uL (ref 0.0–0.1)
Basophils Relative: 1.5 % (ref 0.0–3.0)
Eosinophils Absolute: 0.1 10*3/uL (ref 0.0–0.7)
Eosinophils Relative: 3.1 % (ref 0.0–5.0)
HCT: 38.3 % (ref 36.0–46.0)
Hemoglobin: 12.9 g/dL (ref 12.0–15.0)
Lymphocytes Relative: 37.9 % (ref 12.0–46.0)
Lymphs Abs: 1.7 10*3/uL (ref 0.7–4.0)
MCHC: 33.5 g/dL (ref 30.0–36.0)
MCV: 95.3 fl (ref 78.0–100.0)
Monocytes Absolute: 0.4 10*3/uL (ref 0.1–1.0)
Monocytes Relative: 8.2 % (ref 3.0–12.0)
Neutro Abs: 2.2 10*3/uL (ref 1.4–7.7)
Neutrophils Relative %: 49.3 % (ref 43.0–77.0)
Platelets: 228 10*3/uL (ref 150.0–400.0)
RBC: 4.02 Mil/uL (ref 3.87–5.11)
RDW: 13.2 % (ref 11.5–15.5)
WBC: 4.4 10*3/uL (ref 4.0–10.5)

## 2020-10-17 LAB — BASIC METABOLIC PANEL
BUN: 17 mg/dL (ref 6–23)
CO2: 31 mEq/L (ref 19–32)
Calcium: 9.9 mg/dL (ref 8.4–10.5)
Chloride: 103 mEq/L (ref 96–112)
Creatinine, Ser: 1.04 mg/dL (ref 0.40–1.20)
GFR: 56.59 mL/min — ABNORMAL LOW (ref >60.00)
Glucose, Bld: 88 mg/dL (ref 70–99)
Potassium: 4.9 mEq/L (ref 3.5–5.1)
Sodium: 140 mEq/L (ref 135–145)

## 2020-10-17 LAB — VITAMIN D 25 HYDROXY (VIT D DEFICIENCY, FRACTURES): VITD: 38.58 ng/mL (ref 30.00–100.00)

## 2020-10-17 LAB — HEPATIC FUNCTION PANEL
ALT: 13 U/L (ref 0–35)
AST: 18 U/L (ref 0–37)
Albumin: 4.3 g/dL (ref 3.5–5.2)
Alkaline Phosphatase: 54 U/L (ref 39–117)
Bilirubin, Direct: 0.1 mg/dL (ref 0.0–0.3)
Total Bilirubin: 0.5 mg/dL (ref 0.2–1.2)
Total Protein: 6.8 g/dL (ref 6.0–8.3)

## 2020-10-17 LAB — LIPID PANEL
Cholesterol: 198 mg/dL (ref 0–200)
HDL: 86.6 mg/dL (ref >39.00)
LDL Cholesterol: 91 mg/dL (ref 0–99)
NonHDL: 111.52
Total CHOL/HDL Ratio: 2
Triglycerides: 102 mg/dL (ref 0.0–149.0)
VLDL: 20.4 mg/dL (ref 0.0–40.0)

## 2020-10-17 LAB — HEMOGLOBIN A1C: Hgb A1c MFr Bld: 5.1 % (ref 4.6–6.5)

## 2020-10-18 ENCOUNTER — Ambulatory Visit: Payer: 59 | Admitting: Physical Therapy

## 2020-10-18 ENCOUNTER — Other Ambulatory Visit: Payer: Self-pay

## 2020-10-18 ENCOUNTER — Encounter: Payer: Self-pay | Admitting: Physical Therapy

## 2020-10-18 DIAGNOSIS — M25611 Stiffness of right shoulder, not elsewhere classified: Secondary | ICD-10-CM

## 2020-10-18 DIAGNOSIS — M25511 Pain in right shoulder: Secondary | ICD-10-CM | POA: Diagnosis not present

## 2020-10-18 DIAGNOSIS — G8929 Other chronic pain: Secondary | ICD-10-CM

## 2020-10-18 DIAGNOSIS — M6281 Muscle weakness (generalized): Secondary | ICD-10-CM

## 2020-10-18 NOTE — Patient Instructions (Signed)
Access Code: [email or web address] URL: [email or web address] Date: 10/18/2020 Prepared by: Caleb Popp  Exercises  Shoulder External Rotation with Anchored Resistance - 1 x daily - 7 x weekly - 2 sets - 10 reps Shoulder Internal Rotation with Resistance - 1 x daily - 7 x weekly - 2 sets - 10 reps Standing Row with Anchored Resistance - 1 x daily - 7 x weekly - 2 sets - 12 reps Shoulder Extension with Resistance - 1 x daily - 7 x weekly - 2 sets - 12 reps Standing Shoulder Extension with Dowel - 1 x daily - 7 x weekly - 10 reps Standing Shoulder Internal Rotation AAROM Behind Back with Towel - 1 x daily - 7 x weekly - 10 reps Standing Bilateral Shoulder Internal Rotation AAROM with Dowel - 1 x daily - 7 x weekly - 10 reps Single Arm Shoulder Flexion with Dumbbell - 1 x daily - 7 x weekly - 2-3 sets - 12 reps Shoulder Abduction with Dumbbells - Thumbs Up - 1 x daily - 7 x weekly - 2-3 sets - 12 reps Wall Push Up - 1 x daily - 7 x weekly - 2 sets - 12 reps

## 2020-10-18 NOTE — Therapy (Addendum)
[place], [place], [address] Phone: [phone]   Fax:  [phone]  Physical Therapy Treatment  Patient Details  Name: Kristina Pearson MRN: [id number] Date of Birth: 24-Oct-1955 Referring Provider (PT): Sharyon Medicus, [place]   Encounter Date: 10/18/2020   PT End of Session - 10/18/20 1134     Visit Number 21    Number of Visits 27    Date for PT Re-Evaluation 11/18/20    Authorization Type AETNA    Authorization Time Period FOTO by 16th visit    PT Start Time 1138   pt late   PT Stop Time 1218    PT Time Calculation (min) 40 min    Activity Tolerance Patient tolerated treatment well    Behavior During Therapy [facility] for tasks assessed/performed             Past Medical History:  Diagnosis Date   ADHD    ADHD (attention deficit hyperactivity disorder)    ADD   Allergy    Anxiety    Arthritis    osteoarthritrs   Breast cancer ([place]) 08/15/13   right lateral upper outer   Depression    Family history of breast cancer    Family history of colon cancer    Family history of melanoma    Family history of ovarian cancer    Fibromyalgia    GERD (gastroesophageal reflux disease)    TUMS   Headache(784.0)    sees Dr. Gwendel Hanson at [facility]    History of depression    Hx of radiation therapy 10/19/13- 11/09/13   right breast 4256 cGy in 16 sessions, hypo-fractionated   Hypothyroid    Migraine    Osteopenia    Personal history of radiation therapy 2015   PONV (postoperative nausea and vomiting)    Sialoadenitis    Spinal stenosis    Urinary incontinence    Vitiligo    Wears glasses     Past Surgical History:  Procedure Laterality Date   BREAST BIOPSY Right 07/2013   BREAST LUMPECTOMY Right 08/2013   BREAST LUMPECTOMY WITH RADIOACTIVE SEED LOCALIZATION Right 08/30/2013   Procedure: RIGHT PARTIAL MASTECTOMY WITH RADIOACTIVE SEED LOCALIZATION;  Surgeon: [name], MD;   Location: [place];  Service: General;  Laterality: Right;   [place]   lap   COLONOSCOPY  2007   per Dr. Cristina Gong, clear, repeat in 10 yrs   [place]      There were no vitals filed for this visit.   Subjective Assessment - 10/18/20 1139     Subjective I had some trouble sleeping last night, I had to sleep on the couch due to my neck pain.    Currently in Pain? Yes    Pain Score 0-No pain    Pain Location Shoulder    Pain Orientation Right    Pain Score 4    Pain Location Neck                                               [facility] Adult PT Treatment/Exercise - 10/18/20 0001       Shoulder Exercises: Seated   Other Seated Exercises  double ER + retraction red 2x12      Shoulder Exercises: Standing   External Rotation 15 reps;Right    Theraband Level (Shoulder External Rotation) Level 2 (Red)    Internal Rotation 15 reps;Right    Theraband Level (Shoulder Internal Rotation) Level 2 (Red)    Flexion 12 reps;Right    Shoulder Flexion Weight (lbs) 2    ABduction Right;12 reps    ABduction Limitations 2    Extension 15 reps   2 sets   Theraband Level (Shoulder Extension) Level 2 (Red);Level 3 (Green)    Row 15 reps   2 sets   Theraband Level (Shoulder Row) Level 2 (Red)    Other Standing Exercises dowel extension, shrug,and cross body adduction with dowel x12 each    Other Standing Exercises cabinet reaches with protraction R 2x10 2# weight; chest flys yellow 2x12, wall balls 45 degree angle 2x20 counter and then clockwise, wall push ups x12      Shoulder Exercises: ROM/Strengthening   UBE (Upper Arm Bike) UBE L2 4 min (31fwd/2ck)      Manual Therapy   Manual Therapy Passive ROM;Joint mobilization    Joint Mobilization grade 2-3 joint mobs inferior and posterior    Passive ROM R PROM flexion, abduction, and ER                           PT Education - 10/18/20 1230     Education Details HEP update    Person(s) Educated Patient    Methods Explanation;Demonstration;Handout    Comprehension Verbalized understanding;Returned demonstration;Need further instruction              PT Short Term Goals - 08/26/20 1404       PT SHORT TERM GOAL #1   Title Patient will be I with initial HEP to progress with PT    Time --    Period Weeks    Status Achieved    Target Date --      PT SHORT TERM GOAL #2   Title Patient will demonstrate PROM >/= 120 deg elevation to reduce shoulder stiffness and progress allowable range    Baseline 128    Time --    Period --    Status Achieved    Target Date --      PT SHORT TERM GOAL #3   Title Patient will report </= 5/10 pain with therapy to reduce limitation and allow progression within protocol    Time --    Period --    Status Achieved    Target Date --      PT SHORT TERM GOAL #4   Title PT will review FOTO with patient by 3rd visit    Status Achieved                PT Long Term Goals - 09/30/20 1321       PT LONG TERM GOAL #1   Title Patient will be I with final HEP to maintain progress from PT    Baseline final HEP not given yet    Time 7    Period Weeks    Status On-going    Target Date 11/18/20      PT LONG TERM GOAL #2   Title Patient will report improved functional status >/= 55% on FOTO    Baseline 49% functional status, predicted 55%    Time 7    Period Weeks    Status  On-going    Target Date 11/18/20      PT LONG TERM GOAL #3   Title Patient will demonstrate right shoulder AROM grossly WFL to return to swimming    Baseline 127 flexion, abd 113, 44 ER    Time 7    Period Weeks    Status On-going    Target Date 11/18/20      PT LONG TERM GOAL #4   Title Patient will demonstrate right shoulder rotator cuff strength >/= 4/5 MMT to be able to lift objects into top cabinets    Baseline 4- to 4+    Time 7    Period Weeks     Status On-going    Target Date 11/18/20                        Plan - 10/18/20 1231     Clinical Impression Statement Pt tolerated tx well with no adverse effects this session. Progressed to all standing exercises this session. CKC stabilizatoin shoulder exercises introduced this session with good tolerance. Cabinet reaches progressed to add protraction with it. Functional horizontal abd/adduction introdcued this session with chest flys. Pt continues to benefit from skilled PT in order to progress R UE functional strength and usage in order to be able to return to water aerobics, gym, driving, and sleep without pain.    PT Treatment/Interventions ADLs/Self Care Home Management;Aquatic Therapy;Cryotherapy;Electrical Stimulation;Iontophoresis 4mg /ml Dexamethasone;Moist Heat;Neuromuscular re-education;Therapeutic exercise;Therapeutic activities;Functional mobility training;Patient/family education;Manual techniques;Dry needling;Passive range of motion;Taping;Joint Manipulations    PT Next Visit Plan ER at 90 degrees abduction PROM, standing abduction strengthening, overhead strengthening, periscapular strengthening,    PT Home Exercise Plan [email or web address]    Consulted and Agree with Plan of Care Patient             Patient will benefit from skilled therapeutic intervention in order to improve the following deficits and impairments:     Visit Diagnosis: Chronic right shoulder pain  Stiffness of right shoulder, not elsewhere classified  Muscle weakness (generalized)      Problem List Patient Active Problem List   Diagnosis Date Noted   Genetic testing 10/02/2019   Environmental and seasonal allergies 09/19/2019   Family history of colon cancer    Family history of melanoma    Family history of ovarian cancer    Family history of breast cancer    Anxiety disorder 01/09/2019   Cervical stenosis of spine 10/21/2018   Insomnia 10/21/2018   Breast tenderness in female  02/05/2014   Pes planus of both feet 01/15/2014   Low back pain 11/23/2013   Phlebitis 10/06/2013   History of depression 09/26/2013   Breast cancer of upper-outer quadrant of right female breast ([place]) 08/18/2013   ADHD (attention deficit hyperactivity disorder) 12/01/2011   Headache 04/03/2009   Hypothyroidism 05/05/2007   GERD 05/05/2007   Osteoarthritis 05/05/2007    Caleb Popp, SPT 10/18/2020, 12:35 PM  [facility] [address] [place], [place], [address] Phone: [phone]   Fax:  [phone]  Name: Kristina Pearson MRN: [id number] Date of Birth: 1955-12-05

## 2020-10-20 ENCOUNTER — Other Ambulatory Visit: Payer: Self-pay | Admitting: Family Medicine

## 2020-10-21 ENCOUNTER — Ambulatory Visit: Payer: 59 | Attending: Physician Assistant | Admitting: Physical Therapy

## 2020-10-21 ENCOUNTER — Other Ambulatory Visit: Payer: Self-pay

## 2020-10-21 ENCOUNTER — Encounter: Payer: Self-pay | Admitting: Physical Therapy

## 2020-10-21 DIAGNOSIS — M6281 Muscle weakness (generalized): Secondary | ICD-10-CM | POA: Insufficient documentation

## 2020-10-21 DIAGNOSIS — M25611 Stiffness of right shoulder, not elsewhere classified: Secondary | ICD-10-CM | POA: Diagnosis present

## 2020-10-21 DIAGNOSIS — M25511 Pain in right shoulder: Secondary | ICD-10-CM | POA: Insufficient documentation

## 2020-10-21 DIAGNOSIS — G8929 Other chronic pain: Secondary | ICD-10-CM | POA: Diagnosis present

## 2020-10-21 DIAGNOSIS — M542 Cervicalgia: Secondary | ICD-10-CM | POA: Insufficient documentation

## 2020-10-21 NOTE — Patient Instructions (Signed)
Access Code: [email or web address] URL: [email or web address] Date: 10/21/2020 Prepared by: Hilda Blades  Exercises Shoulder External Rotation with Anchored Resistance - 1 x daily - 7 x weekly - 2 sets - 10 reps Shoulder Internal Rotation with Resistance - 1 x daily - 7 x weekly - 2 sets - 10 reps Standing Row with Anchored Resistance - 1 x daily - 7 x weekly - 2 sets - 12 reps Shoulder Extension with Resistance - 1 x daily - 7 x weekly - 2 sets - 12 reps Wall Push Up - 1 x daily - 7 x weekly - 2 sets - 12 reps Scaption with Dumbbells - 1 x daily - 7 x weekly - 2 sets - 8-10 reps Seated Shoulder External Rotation in Abduction Supported with Dumbbell - 1 x daily - 7 x weekly - 2 sets - 15 reps Sleeper Stretch - 2-3 x daily - 7 x weekly - 2 reps - 30 seconds hold Standing Shoulder Internal Rotation Stretch with Towel - 2-3 x daily - 7 x weekly - 5 reps - 10-15 seconds hold Step Back Shoulder Stretch with Chair - 2-3 x daily - 7 x weekly - 5 reps - 10-15 seconds hold

## 2020-10-21 NOTE — Therapy (Signed)
[place], [place], [address] Phone: [phone]   Fax:  [phone]  Physical Therapy Treatment  Patient Details  Name: Kristina Pearson MRN: [id number] Date of Birth: 1956-02-28 Referring Provider (PT): Currence, Gasper Sells, [place]   Encounter Date: 10/21/2020   PT End of Session - 10/21/20 1009    Visit Number 22    Number of Visits 27    Date for PT Re-Evaluation 11/18/20    Authorization Type AETNA    PT Start Time 1000    PT Stop Time 1045    PT Time Calculation (min) 45 min    Activity Tolerance Patient tolerated treatment well    Behavior During Therapy [facility] for tasks assessed/performed           Past Medical History:  Diagnosis Date  . ADHD   . ADHD (attention deficit hyperactivity disorder)    ADD  . Allergy   . Anxiety   . Arthritis    osteoarthritrs  . Breast cancer ([place]) 08/15/13   right lateral upper outer  . Depression   . Family history of breast cancer   . Family history of colon cancer   . Family history of melanoma   . Family history of ovarian cancer   . Fibromyalgia   . GERD (gastroesophageal reflux disease)    TUMS  . GLOVFIEP(329.5)    sees Dr. Gwendel Hanson at [facility]   . History of depression   . Hx of radiation therapy 10/19/13- 11/09/13   right breast 4256 cGy in 16 sessions, hypo-fractionated  . Hypothyroid   . Migraine   . Osteopenia   . Personal history of radiation therapy 2015  . PONV (postoperative nausea and vomiting)   . Sialoadenitis   . Spinal stenosis   . Urinary incontinence   . Vitiligo   . Wears glasses     Past Surgical History:  Procedure Laterality Date  . BREAST BIOPSY Right 07/2013  . BREAST LUMPECTOMY Right 08/2013  . BREAST LUMPECTOMY WITH RADIOACTIVE SEED LOCALIZATION Right 08/30/2013   Procedure: RIGHT PARTIAL MASTECTOMY WITH RADIOACTIVE SEED LOCALIZATION;  Surgeon: [name], MD;  Location: [place];   Service: General;  Laterality: Right;  . BREAST SURGERY    . CHOLECYSTECTOMY  1996   lap  . COLONOSCOPY  2007   per Dr. Cristina Gong, clear, repeat in 10 yrs  . DIAGNOSTIC LAPAROSCOPY  1982   exp  . LAPAROSCOPIC OVARIAN CYSTECTOMY    . URETHRAL DILATION      There were no vitals filed for this visit.   Subjective Assessment - 10/21/20 1007    Subjective Patient reports she is doing well with no new issues.    Patient Stated Goals Get full use of right arm and recondition her body    Currently in Pain? No/denies              [facility] PT Assessment - 10/21/20 0001      AROM   Right Shoulder Flexion 155 Degrees   compensated scaption at end range   Right Shoulder Internal Rotation --   reach to L2   Right Shoulder External Rotation --   reach to T2                        [facility] Adult PT Treatment/Exercise - 10/21/20 0001      Exercises   Exercises Shoulder  Shoulder Exercises: Seated   External Rotation 12 reps   2 sets   External Rotation Weight (lbs) 3    External Rotation Limitations seated with elbow supported at 90 deg      Shoulder Exercises: Standing   External Rotation 10 reps   2 sets   Theraband Level (Shoulder External Rotation) Level 2 (Red)    Internal Rotation 10 reps   2 sets   Theraband Level (Shoulder Internal Rotation) Level 2 (Red)    Shoulder Elevation Limitations Scaption with 3# 2 x 8      Shoulder Exercises: ROM/Strengthening   UBE (Upper Arm Bike) L2 x 5 min (2.5 fwd/bwd)      Shoulder Exercises: Stretch   Internal Rotation Stretch 5 reps   10 sec hold   Internal Rotation Stretch Limitations IR behind back with towel    Table Stretch - Flexion 3 reps;10 seconds    Table Stretch -Flexion Limitations step back at counter    Other Shoulder Stretches Sleeper stretch 2 x 20 sec      Manual Therapy   Manual Therapy Joint mobilization;Passive ROM    Joint Mobilization GHJ mobs focusing inferior/posterior at various ranges     Passive ROM Shoulder all directions, focus on IR at 90 deg abd                  PT Education - 10/21/20 1008    Education Details HEP update    Person(s) Educated Patient    Methods Explanation;Demonstration;Verbal cues;Handout    Comprehension Verbalized understanding;Need further instruction;Returned demonstration;Verbal cues required            PT Short Term Goals - 08/26/20 1404      PT SHORT TERM GOAL #1   Title Patient will be I with initial HEP to progress with PT    Time --    Period Weeks    Status Achieved    Target Date --      PT SHORT TERM GOAL #2   Title Patient will demonstrate PROM >/= 120 deg elevation to reduce shoulder stiffness and progress allowable range    Baseline 128    Time --    Period --    Status Achieved    Target Date --      PT SHORT TERM GOAL #3   Title Patient will report </= 5/10 pain with therapy to reduce limitation and allow progression within protocol    Time --    Period --    Status Achieved    Target Date --      PT SHORT TERM GOAL #4   Title PT will review FOTO with patient by 3rd visit    Status Achieved             PT Long Term Goals - 10/21/20 1102      PT LONG TERM GOAL #1   Title Patient will be I with final HEP to maintain progress from PT    Baseline continuing to progress    Time 7    Period Weeks    Status On-going    Target Date 11/18/20      PT LONG TERM GOAL #2   Title Patient will report improved functional status >/= 55% on FOTO    Baseline 49% functional status, predicted 55%    Time 7    Period Weeks    Status On-going    Target Date 11/18/20      PT LONG TERM  GOAL #3   Title Patient will demonstrate right shoulder AROM grossly WFL to return to swimming    Baseline continue limitation with functional reach behind back    Time 7    Period Weeks    Status On-going    Target Date 11/18/20      PT LONG TERM GOAL #4   Title Patient will demonstrate right shoulder rotator cuff  strength >/= 4/5 MMT to be able to lift objects into top cabinets    Baseline 4- to 4+ /5    Time 7    Period Weeks    Status On-going    Target Date 11/18/20                 Plan - 10/21/20 1010    Clinical Impression Statement Patient tolerated therapy well with no adverse effects. She demonstrates continued improvement with shoulder motion but limited mainly with functional reach behind back. Therapy continued focus on progressing shoulder mobility and rotator cuff strength. No pain reported with therapy this visit and updated HEP with good tolerance. Patient would benefit from continued skilled PT in order to progress shoulder range of motion and strength to reduce pain and return to prior level of function.    PT Treatment/Interventions ADLs/Self Care Home Management;Aquatic Therapy;Cryotherapy;Electrical Stimulation;Iontophoresis 4mg /ml Dexamethasone;Moist Heat;Neuromuscular re-education;Therapeutic exercise;Therapeutic activities;Functional mobility training;Patient/family education;Manual techniques;Dry needling;Passive range of motion;Taping;Joint Manipulations    PT Next Visit Plan Review HEP and progress PRN, continue shoulder manual/stretching to improve end range motion, progress rotator cuff and periscapular strength    PT Home Exercise Plan [email or web address]    Consulted and Agree with Plan of Care Patient           Patient will benefit from skilled therapeutic intervention in order to improve the following deficits and impairments:  Decreased range of motion,Postural dysfunction,Decreased strength,Pain,Decreased activity tolerance,Impaired UE functional use  Visit Diagnosis: Chronic right shoulder pain  Stiffness of right shoulder, not elsewhere classified  Muscle weakness (generalized)     Problem List Patient Active Problem List   Diagnosis Date Noted  . Genetic testing 10/02/2019  . Environmental and seasonal allergies 09/19/2019  . Family history of colon cancer    . Family history of melanoma   . Family history of ovarian cancer   . Family history of breast cancer   . Anxiety disorder 01/09/2019  . Cervical stenosis of spine 10/21/2018  . Insomnia 10/21/2018  . Breast tenderness in female 02/05/2014  . Pes planus of both feet 01/15/2014  . Low back pain 11/23/2013  . Phlebitis 10/06/2013  . History of depression 09/26/2013  . Breast cancer of upper-outer quadrant of right female breast ([place]) 08/18/2013  . ADHD (attention deficit hyperactivity disorder) 12/01/2011  . Headache 04/03/2009  . Hypothyroidism 05/05/2007  . GERD 05/05/2007  . Osteoarthritis 05/05/2007    Hilda Blades, PT, DPT, LAT, ATC 10/21/20  11:05 AM Phone: [phone] Fax: [place] [facility] [address] [place], [place], [address] Phone: [phone]   Fax:  [phone]  Name: GEANETTE BUONOCORE MRN: [id number] Date of Birth: 03-May-1956

## 2020-10-22 ENCOUNTER — Other Ambulatory Visit: Payer: Self-pay

## 2020-10-23 ENCOUNTER — Ambulatory Visit (INDEPENDENT_AMBULATORY_CARE_PROVIDER_SITE_OTHER): Payer: 59 | Admitting: Family Medicine

## 2020-10-23 ENCOUNTER — Encounter: Payer: Self-pay | Admitting: Family Medicine

## 2020-10-23 VITALS — BP 120/88 | HR 98 | Temp 98.4°F | Ht 66.0 in | Wt 164.0 lb

## 2020-10-23 DIAGNOSIS — Z0000 Encounter for general adult medical examination without abnormal findings: Secondary | ICD-10-CM | POA: Diagnosis not present

## 2020-10-23 NOTE — Progress Notes (Signed)
   Subjective:    Patient ID: Kristina Pearson, female    DOB: 06-May-1956, 65 y.o.   MRN: [id number]  HPI Here for a well exam. She feels well except for neck pain and some arthritis. We sent her to see Dr. Othelia Pulling for cervical stenosis, and he has set her up with Dr. Jacelyn Grip for an epidural steroid injection and some PT. She recently has fasting labs that were unremarkable.    Review of Systems  Constitutional: Negative.   HENT: Negative.   Eyes: Negative.   Respiratory: Negative.   Cardiovascular: Negative.   Gastrointestinal: Negative.   Genitourinary: Negative for decreased urine volume, difficulty urinating, dyspareunia, dysuria, enuresis, flank pain, frequency, hematuria, pelvic pain and urgency.  Musculoskeletal: Negative.   Skin: Negative.   Neurological: Negative.   Psychiatric/Behavioral: Negative.        Objective:   Physical Exam Constitutional:      General: She is not in acute distress.    Appearance: Normal appearance. She is well-developed.  HENT:     Head: Normocephalic and atraumatic.     Right Ear: External ear normal.     Left Ear: External ear normal.     Nose: Nose normal.     Mouth/Throat:     Pharynx: No oropharyngeal exudate.  Eyes:     General: No scleral icterus.    Conjunctiva/sclera: Conjunctivae normal.     Pupils: Pupils are equal, round, and reactive to light.  Neck:     Thyroid: No thyromegaly.     Vascular: No JVD.  Cardiovascular:     Rate and Rhythm: Normal rate and regular rhythm.     Heart sounds: Normal heart sounds. No murmur heard. No friction rub. No gallop.   Pulmonary:     Effort: Pulmonary effort is normal. No respiratory distress.     Breath sounds: Normal breath sounds. No wheezing or rales.  Chest:     Chest wall: No tenderness.  Abdominal:     General: Bowel sounds are normal. There is no distension.     Palpations: Abdomen is soft. There is no mass.     Tenderness: There is no abdominal tenderness. There is no guarding  or rebound.  Musculoskeletal:        General: No tenderness. Normal range of motion.     Cervical back: Normal range of motion and neck supple.  Lymphadenopathy:     Cervical: No cervical adenopathy.  Skin:    General: Skin is warm and dry.     Findings: No erythema or rash.  Neurological:     Mental Status: She is alert and oriented to person, place, and time.     Cranial Nerves: No cranial nerve deficit.     Motor: No abnormal muscle tone.     Coordination: Coordination normal.     Deep Tendon Reflexes: Reflexes are normal and symmetric. Reflexes normal.  Psychiatric:        Behavior: Behavior normal.        Thought Content: Thought content normal.        Judgment: Judgment normal.           Assessment & Plan:  Well exam. We discussed diet and exercise. She will follow up with Orthopedics as above.  [name], MD

## 2020-10-28 ENCOUNTER — Ambulatory Visit: Payer: 59 | Admitting: Physical Therapy

## 2020-11-04 ENCOUNTER — Encounter: Payer: 59 | Admitting: Physical Therapy

## 2020-11-14 ENCOUNTER — Encounter: Payer: 59 | Admitting: Physical Therapy

## 2020-11-19 ENCOUNTER — Ambulatory Visit: Payer: 59 | Admitting: Physical Therapy

## 2020-11-19 ENCOUNTER — Other Ambulatory Visit: Payer: Self-pay

## 2020-11-19 ENCOUNTER — Encounter: Payer: Self-pay | Admitting: Physical Therapy

## 2020-11-19 DIAGNOSIS — G8929 Other chronic pain: Secondary | ICD-10-CM

## 2020-11-19 DIAGNOSIS — M25511 Pain in right shoulder: Secondary | ICD-10-CM | POA: Diagnosis not present

## 2020-11-19 DIAGNOSIS — M542 Cervicalgia: Secondary | ICD-10-CM

## 2020-11-19 DIAGNOSIS — M6281 Muscle weakness (generalized): Secondary | ICD-10-CM

## 2020-11-19 DIAGNOSIS — M25611 Stiffness of right shoulder, not elsewhere classified: Secondary | ICD-10-CM

## 2020-11-19 NOTE — Patient Instructions (Signed)
Access Code: [email or web address] URL: [email or web address] Date: 11/19/2020 Prepared by: Hilda Blades  Exercises Shoulder External Rotation with Anchored Resistance - 1 x daily - 7 x weekly - 2 sets - 10 reps Shoulder Internal Rotation with Resistance - 1 x daily - 7 x weekly - 2 sets - 10 reps Standing Row with Anchored Resistance - 1 x daily - 7 x weekly - 2 sets - 12 reps Shoulder Extension with Resistance - 1 x daily - 7 x weekly - 2 sets - 12 reps Wall Push Up - 1 x daily - 7 x weekly - 2 sets - 12 reps Scaption with Dumbbells - 1 x daily - 7 x weekly - 2 sets - 8-10 reps Seated Shoulder External Rotation in Abduction Supported with Dumbbell - 1 x daily - 7 x weekly - 2 sets - 15 reps Sleeper Stretch - 2-3 x daily - 7 x weekly - 2 reps - 30 seconds hold Standing Shoulder Internal Rotation Stretch with Towel - 2-3 x daily - 7 x weekly - 5 reps - 10-15 seconds hold Step Back Shoulder Stretch with Chair - 2-3 x daily - 7 x weekly - 5 reps - 10-15 seconds hold Supine Cervical Retraction with Towel - 2 x daily - 7 x weekly - 10 reps - 5 hold Doorway Pec Stretch at 90 Degrees Abduction - 2 x daily - 7 x weekly - 3 reps - 30 hold Shoulder External Rotation and Scapular Retraction with Resistance - 2 x daily - 7 x weekly - 10 reps - 3 hold

## 2020-11-20 ENCOUNTER — Encounter: Payer: Self-pay | Admitting: Physical Therapy

## 2020-11-20 NOTE — Therapy (Signed)
[place], [place], [address] Phone: [phone]   Fax:  [phone]  Physical Therapy Re-eval and Treatment  Patient Details  Name: Kristina Pearson MRN: [id number] Date of Birth: 1956-06-08 Referring Provider (PT): Currence, Gasper Sells, [place]   Encounter Date: 11/19/2020   PT End of Session - 11/19/20 1714    Visit Number 23    Number of Visits 29    Date for PT Re-Evaluation 01/07/21    Authorization Type AETNA    PT Start Time 1701    PT Stop Time 1745    PT Time Calculation (min) 44 min    Activity Tolerance Patient tolerated treatment well    Behavior During Therapy [facility] for tasks assessed/performed           Past Medical History:  Diagnosis Date  . ADHD   . ADHD (attention deficit hyperactivity disorder)    ADD  . Allergy   . Anxiety   . Arthritis    osteoarthritrs  . Breast cancer ([place]) 08/15/13   right lateral upper outer  . Depression   . Family history of breast cancer   . Family history of colon cancer   . Family history of melanoma   . Family history of ovarian cancer   . Fibromyalgia   . GERD (gastroesophageal reflux disease)    TUMS  . DTOIZTIW(580.9)    sees Dr. Gwendel Hanson at [facility]   . History of depression   . Hx of radiation therapy 10/19/13- 11/09/13   right breast 4256 cGy in 16 sessions, hypo-fractionated  . Hypothyroid   . Migraine   . Osteopenia   . Personal history of radiation therapy 2015  . PONV (postoperative nausea and vomiting)   . Sialoadenitis   . Spinal stenosis   . Urinary incontinence   . Vitiligo   . Wears glasses     Past Surgical History:  Procedure Laterality Date  . BREAST BIOPSY Right 07/2013  . BREAST LUMPECTOMY Right 08/2013  . BREAST LUMPECTOMY WITH RADIOACTIVE SEED LOCALIZATION Right 08/30/2013   Procedure: RIGHT PARTIAL MASTECTOMY WITH RADIOACTIVE SEED LOCALIZATION;  Surgeon: [name], MD;  Location: [facility];  Service: General;  Laterality: Right;  . BREAST SURGERY    . CHOLECYSTECTOMY  1996   lap  . COLONOSCOPY  01/22/2020   per Dr. Cristina Gong, benign polyps, repeat in 7 yrs   . DIAGNOSTIC LAPAROSCOPY  1982   exp  . LAPAROSCOPIC OVARIAN CYSTECTOMY    . URETHRAL DILATION      There were no vitals filed for this visit.   Subjective Assessment - 11/19/20 1701    Subjective Patient reports she had an injection in her neck about a week ago. Her doctor told her that the pain in her neck is coming from something pressing on the nerve in her neck. States her neck still hurts. Patient reports that her right shoulder is good, she feels like it is getting stronger and her motion is improving. Patient notes lift sided forehead vein pain last night    Limitations Sitting;Lifting;House hold activities    Patient Stated Goals Get full use of right arm and recondition her body    Currently in Pain? Yes    Pain Score 0-No pain    Pain Location Shoulder    Pain Orientation Right    Pain Score 8    Pain Location Neck    Pain Descriptors / Indicators Tightness;Pressure  Pain Type Chronic pain    Pain Radiating Towards numbness in bilateral hands to fingertips, changes from side to side but mostly on left    Pain Onset More than a month ago    Pain Frequency Constant    Aggravating Factors  General activity, getting comfortable for sleep    Pain Relieving Factors Medication              [facility] PT Assessment - 11/20/20 0001      Assessment   Medical Diagnosis Right rotator cuff repair, Neck pain    Referring Provider (PT) Currence, Gasper Sells, PA-C    Onset Date/Surgical Date 07/03/20    Hand Dominance Right    Next MD Visit 11/29/20   cervical spine doctor     Precautions   Precautions None      Restrictions   Weight Bearing Restrictions No      Balance Screen   Has the patient fallen in the past 6 months No    Has the patient had a decrease in activity level because of a fear  of falling?  No    Is the patient reluctant to leave their home because of a fear of falling?  No      Prior Function   Level of Independence Independent    Vocation Retired    Dealer   Overall Cognitive Status Within Functional Limits for tasks assessed      Observation/Other Assessments   Focus on Therapeutic Outcomes (FOTO)  Not assessed this visit, 49% functional status assessed 09/30/20      Sensation   Light Touch Appears Intact    Additional Comments patient reports consistent numbness/tingling bilateral (L > R) fingertips      AROM   AROM Assessment Site Cervical;Shoulder    Right/Left Shoulder Right;Left    Right Shoulder Flexion 160 Degrees    Right Shoulder ABduction 165 Degrees    Right Shoulder Internal Rotation --   reach to T10   Right Shoulder External Rotation 55 Degrees   reach to T2   Left Shoulder Flexion 160 Degrees    Left Shoulder ABduction 165 Degrees    Left Shoulder Internal Rotation --   reach to T7   Left Shoulder External Rotation 55 Degrees   reach to T2   Cervical Flexion 25    Cervical Extension 40    Cervical - Right Side Bend 10    Cervical - Left Side Bend 10    Cervical - Right Rotation 40    Cervical - Left Rotation 50      Strength   Strength Assessment Site Shoulder    Right/Left Shoulder Right;Left    Right Shoulder Flexion 4+/5    Right Shoulder Extension 5/5    Right Shoulder ABduction 4/5    Right Shoulder Internal Rotation 4+/5    Right Shoulder External Rotation 4+/5    Left Shoulder Flexion 5/5    Left Shoulder Extension 5/5    Left Shoulder ABduction 4+/5    Left Shoulder Internal Rotation 5/5    Left Shoulder External Rotation 5/5                         [facility] Adult PT Treatment/Exercise - 11/20/20 0001      Exercises   Exercises Shoulder;Neck      Neck Exercises: Machines for Strengthening   UBE (Upper Arm Bike) L1 x 4 min (2  fwd/bwd)      Neck Exercises: Theraband    Shoulder External Rotation 10 reps;Red    Shoulder External Rotation Limitations double er + scap retraction      Neck Exercises: Supine   Neck Retraction 10 reps;5 secs    Neck Retraction Limitations single pillow, cued for technique      Neck Exercises: Stretches   Chest Stretch 2 reps;30 seconds    Chest Stretch Limitations doorway at 90 deg                  PT Education - 11/19/20 1713    Education Details POC and HEP update, adding cervical spine to diagnosis list    Person(s) Educated Patient    Methods Explanation;Demonstration;Verbal cues;Handout;Tactile cues    Comprehension Verbalized understanding;Returned demonstration;Verbal cues required;Need further instruction;Tactile cues required            PT Short Term Goals - 08/26/20 1404      PT SHORT TERM GOAL #1   Title Patient will be I with initial HEP to progress with PT    Time --    Period Weeks    Status Achieved    Target Date --      PT SHORT TERM GOAL #2   Title Patient will demonstrate PROM >/= 120 deg elevation to reduce shoulder stiffness and progress allowable range    Baseline 128    Time --    Period --    Status Achieved    Target Date --      PT SHORT TERM GOAL #3   Title Patient will report </= 5/10 pain with therapy to reduce limitation and allow progression within protocol    Time --    Period --    Status Achieved    Target Date --      PT SHORT TERM GOAL #4   Title PT will review FOTO with patient by 3rd visit    Status Achieved             PT Long Term Goals - 11/20/20 7106      PT LONG TERM GOAL #1   Title Patient will be I with final HEP to maintain progress from PT    Baseline continuing to progress HEP to include cervical spine    Time 7    Period Weeks    Status On-going    Target Date 01/07/21      PT LONG TERM GOAL #2   Title Patient will report improved functional status >/= 55% on FOTO    Baseline 49% functional status assessed 09/30/20    Time 7     Period Weeks    Status On-going    Target Date 01/07/21      PT LONG TERM GOAL #3   Title Patient will demonstrate right shoulder AROM grossly WFL to return to swimming    Baseline continue limitation with functional reach behind back    Time 7    Period Weeks    Status On-going    Target Date 01/07/21      PT LONG TERM GOAL #4   Title Patient will demonstrate right shoulder rotator cuff strength >/= 5/5 MMT to be able to lift objects into top cabinets and return to swimming    Baseline grossly 4/5 - 4+/5 MMT    Time 7    Period Weeks    Status On-going    Target Date 01/07/21      PT LONG  TERM GOAL #5   Title Patient will report demonstrate >/= 55 deg cervical rotation to improve driving ability    Baseline 40-45 deg    Time 7    Period Weeks    Status New    Target Date 01/07/21      Additional Long Term Goals   Additional Long Term Goals Yes      PT LONG TERM GOAL #6   Title Patient will report </= 2/10 cervical pain and resoluton of numbness/tingling with activity to reduce functional limitations    Baseline 8/10 with activity    Time 7    Period Weeks    Status New    Target Date 01/07/21                 Plan - 11/20/20 0818    Clinical Impression Statement Patient tolerated therapy well with no adverse effects. Patient reporting new referral for cervical spine so performed reassessment to include cervical spine in diagnosis and treatment. Patient does exhibit limitations in cervical motion and decreased DNF endurance, with radicular symptoms including pain and numbness/tingling into fingertips. Patient's right shoulder demonstrates much improved range of motion and strength this visit, and she reports minimal pain with activity. Patient would benefit from continued skilled PT to progress shoulder strength and initiate formal treatment to improve cervical mobility and strength/endurance in order to reduce pain and maximize functional ability.    Personal Factors  and Comorbidities Past/Current Experience;Time since onset of injury/illness/exacerbation;Comorbidity 3+    Comorbidities History of chronic neck pain, anxiety, depression, breast cancer    Examination-Activity Limitations Reach Overhead;Sit;Sleep;Stand;Lift;Hygiene/Grooming;Dressing;Carry;Bathing    Examination-Participation Restrictions Meal Prep;Cleaning;Community Activity;Driving;Shop;Laundry;Yard Work    PT Frequency 1x / week    PT Duration 6 weeks    PT Treatment/Interventions ADLs/Self Care Home Management;Aquatic Therapy;Cryotherapy;Electrical Stimulation;Iontophoresis 4mg /ml Dexamethasone;Moist Heat;Neuromuscular re-education;Therapeutic exercise;Therapeutic activities;Functional mobility training;Patient/family education;Manual techniques;Dry needling;Passive range of motion;Taping;Joint Manipulations;Traction;Ultrasound;Balance training;Spinal Manipulations    PT Next Visit Plan Review HEP and progress PRN, manual for cervical mobility, DNF endurance training, neurodynamic PRN, progress rotator cuff and periscapular strength    PT Home Exercise Plan [email or web address]    Consulted and Agree with Plan of Care Patient           Patient will benefit from skilled therapeutic intervention in order to improve the following deficits and impairments:  Decreased range of motion,Postural dysfunction,Decreased strength,Pain,Decreased activity tolerance,Impaired UE functional use,Impaired sensation,Increased muscle spasms,Impaired flexibility,Decreased endurance  Visit Diagnosis: Cervicalgia  Chronic right shoulder pain  Stiffness of right shoulder, not elsewhere classified  Muscle weakness (generalized)     Problem List Patient Active Problem List   Diagnosis Date Noted  . Genetic testing 10/02/2019  . Environmental and seasonal allergies 09/19/2019  . Family history of colon cancer   . Family history of melanoma   . Family history of ovarian cancer   . Family history of breast cancer    . Anxiety disorder 01/09/2019  . Cervical stenosis of spine 10/21/2018  . Insomnia 10/21/2018  . Breast tenderness in female 02/05/2014  . Pes planus of both feet 01/15/2014  . Low back pain 11/23/2013  . Phlebitis 10/06/2013  . History of depression 09/26/2013  . Breast cancer of upper-outer quadrant of right female breast ([place]) 08/18/2013  . ADHD (attention deficit hyperactivity disorder) 12/01/2011  . Headache 04/03/2009  . Hypothyroidism 05/05/2007  . GERD 05/05/2007  . Osteoarthritis 05/05/2007    Hilda Blades, PT, DPT, LAT, ATC 11/20/20  8:30 AM Phone: [phone] Fax: [phone]  [place], [place], [address] Phone: [phone]   Fax:  [phone]  Name: ZYASIA HALBLEIB MRN: [id number] Date of Birth: November 25, 1955

## 2020-11-26 ENCOUNTER — Ambulatory Visit: Payer: 59 | Attending: Physician Assistant | Admitting: Physical Therapy

## 2020-11-26 ENCOUNTER — Other Ambulatory Visit: Payer: Self-pay

## 2020-11-26 ENCOUNTER — Encounter: Payer: Self-pay | Admitting: Physical Therapy

## 2020-11-26 DIAGNOSIS — M545 Low back pain, unspecified: Secondary | ICD-10-CM | POA: Insufficient documentation

## 2020-11-26 DIAGNOSIS — G8929 Other chronic pain: Secondary | ICD-10-CM | POA: Insufficient documentation

## 2020-11-26 DIAGNOSIS — M25611 Stiffness of right shoulder, not elsewhere classified: Secondary | ICD-10-CM | POA: Diagnosis present

## 2020-11-26 DIAGNOSIS — M25511 Pain in right shoulder: Secondary | ICD-10-CM | POA: Diagnosis present

## 2020-11-26 DIAGNOSIS — M6281 Muscle weakness (generalized): Secondary | ICD-10-CM | POA: Insufficient documentation

## 2020-11-26 DIAGNOSIS — M542 Cervicalgia: Secondary | ICD-10-CM | POA: Diagnosis not present

## 2020-11-26 NOTE — Therapy (Signed)
[place], [place], [address] Phone: [phone]   Fax:  [phone]  Physical Therapy Treatment  Patient Details  Name: Kristina Pearson MRN: [id number] Date of Birth: 1955/10/31 Referring Provider (PT): Clance Boll, Gasper Sells, [place]   Encounter Date: 11/26/2020   PT End of Session - 11/26/20 1222    Visit Number 24    Number of Visits 29    Date for PT Re-Evaluation 01/07/21    Authorization Type AETNA    PT Start Time 1136    PT Stop Time 1215    PT Time Calculation (min) 39 min    Activity Tolerance Patient tolerated treatment well    Behavior During Therapy [facility] for tasks assessed/performed           Past Medical History:  Diagnosis Date  . ADHD   . ADHD (attention deficit hyperactivity disorder)    ADD  . Allergy   . Anxiety   . Arthritis    osteoarthritrs  . Breast cancer ([place]) 08/15/13   right lateral upper outer  . Depression   . Family history of breast cancer   . Family history of colon cancer   . Family history of melanoma   . Family history of ovarian cancer   . Fibromyalgia   . GERD (gastroesophageal reflux disease)    TUMS  . YFVCBSWH(675.9)    sees Dr. Gwendel Hanson at [facility]   . History of depression   . Hx of radiation therapy 10/19/13- 11/09/13   right breast 4256 cGy in 16 sessions, hypo-fractionated  . Hypothyroid   . Migraine   . Osteopenia   . Personal history of radiation therapy 2015  . PONV (postoperative nausea and vomiting)   . Sialoadenitis   . Spinal stenosis   . Urinary incontinence   . Vitiligo   . Wears glasses     Past Surgical History:  Procedure Laterality Date  . BREAST BIOPSY Right 07/2013  . BREAST LUMPECTOMY Right 08/2013  . BREAST LUMPECTOMY WITH RADIOACTIVE SEED LOCALIZATION Right 08/30/2013   Procedure: RIGHT PARTIAL MASTECTOMY WITH RADIOACTIVE SEED LOCALIZATION;  Surgeon: [name], MD;  Location: [facility];   Service: General;  Laterality: Right;  . BREAST SURGERY    . CHOLECYSTECTOMY  1996   lap  . COLONOSCOPY  01/22/2020   per Dr. Cristina Gong, benign polyps, repeat in 7 yrs   . DIAGNOSTIC LAPAROSCOPY  1982   exp  . LAPAROSCOPIC OVARIAN CYSTECTOMY    . URETHRAL DILATION      There were no vitals filed for this visit.   Subjective Assessment - 11/26/20 1139    Subjective Patient reports she is feeling better, she feels like the combination of exercise and steroid injection have helped her neck pain. She still takes muscle relaxers for sleeping but not needing them during the day.    Patient Stated Goals Get full use of right arm and recondition her body    Currently in Pain? No/denies    Pain Score 0-No pain    Pain Location Shoulder    Pain Score 0    Pain Location Neck    Pain Type Chronic pain    Pain Radiating Towards numbness in bilateral hands to fingertips, changes from side to side but mostly on left    Pain Onset More than a month ago    Pain Frequency Intermittent  [facility] PT Assessment - 11/26/20 0001      AROM   Right Shoulder Internal Rotation --   reach to T8   Cervical - Right Side Bend 15    Cervical - Left Side Bend 15                         [facility] Adult PT Treatment/Exercise - 11/26/20 0001      Exercises   Exercises Neck      Neck Exercises: Machines for Strengthening   UBE (Upper Arm Bike) L1 x 4 min (2 fwd/bwd)      Neck Exercises: Theraband   Shoulder Extension 10 reps;Red   10 sec hold   Rows 10 reps;Green   10 sec hold     Manual Therapy   Manual Therapy Joint mobilization;Soft tissue mobilization;Myofascial release;Passive ROM    Joint Mobilization Mid-lower cervical PA mobs in prone    Soft tissue mobilization Upper trap and levator region in prone    Myofascial Release Suboccipital release with gentle cervical traction in supine, left upper trap and levator region    Passive ROM Upper trap and levator manual  stretching in supine      Neck Exercises: Stretches   Chest Stretch 2 reps;30 seconds    Chest Stretch Limitations doorway at 90 deg    Other Neck Stretches Sidelying thoracic rotation 5 x 5 sec each    Other Neck Stretches Standing step back L stretch 5 x 5 sec                  PT Education - 11/26/20 1222    Education Details HEP update for cervical and thoracic mobility, possibly using dry needling in future sessions    Person(s) Educated Patient    Methods Explanation;Demonstration;Verbal cues;Handout;Tactile cues    Comprehension Verbalized understanding;Returned demonstration;Verbal cues required;Need further instruction;Tactile cues required            PT Short Term Goals - 08/26/20 1404      PT SHORT TERM GOAL #1   Title Patient will be I with initial HEP to progress with PT    Time --    Period Weeks    Status Achieved    Target Date --      PT SHORT TERM GOAL #2   Title Patient will demonstrate PROM >/= 120 deg elevation to reduce shoulder stiffness and progress allowable range    Baseline 128    Time --    Period --    Status Achieved    Target Date --      PT SHORT TERM GOAL #3   Title Patient will report </= 5/10 pain with therapy to reduce limitation and allow progression within protocol    Time --    Period --    Status Achieved    Target Date --      PT SHORT TERM GOAL #4   Title PT will review FOTO with patient by 3rd visit    Status Achieved             PT Long Term Goals - 11/20/20 3474      PT LONG TERM GOAL #1   Title Patient will be I with final HEP to maintain progress from PT    Baseline continuing to progress HEP to include cervical spine    Time 7    Period Weeks    Status On-going    Target Date 01/07/21  PT LONG TERM GOAL #2   Title Patient will report improved functional status >/= 55% on FOTO    Baseline 49% functional status assessed 09/30/20    Time 7    Period Weeks    Status On-going    Target Date  01/07/21      PT LONG TERM GOAL #3   Title Patient will demonstrate right shoulder AROM grossly WFL to return to swimming    Baseline continue limitation with functional reach behind back    Time 7    Period Weeks    Status On-going    Target Date 01/07/21      PT LONG TERM GOAL #4   Title Patient will demonstrate right shoulder rotator cuff strength >/= 5/5 MMT to be able to lift objects into top cabinets and return to swimming    Baseline grossly 4/5 - 4+/5 MMT    Time 7    Period Weeks    Status On-going    Target Date 01/07/21      PT LONG TERM GOAL #5   Title Patient will report demonstrate >/= 55 deg cervical rotation to improve driving ability    Baseline 40-45 deg    Time 7    Period Weeks    Status New    Target Date 01/07/21      Additional Long Term Goals   Additional Long Term Goals Yes      PT LONG TERM GOAL #6   Title Patient will report </= 2/10 cervical pain and resoluton of numbness/tingling with activity to reduce functional limitations    Baseline 8/10 with activity    Time 7    Period Weeks    Status New    Target Date 01/07/21                 Plan - 11/26/20 1223    Clinical Impression Statement Patient tolerated therapy well with no adverse effects. She arrived reporting improved symptoms and was able to tolerate progressions in therapy. Patient reported improvement in neck tightness following manual, she did exhibit increased muscular tension left upper trap and levator region so possibly use dry needling in future sessions. She also demonstrates greater shrug on left with banded exercises so requires tactile cueing to avoid upper trap dominant movement. Patient would benefit from continued skilled PT to improve cervical mobility and strength/endurance in order to reduce pain and maximize functional ability    PT Treatment/Interventions ADLs/Self Care Home Management;Aquatic Therapy;Cryotherapy;Electrical Stimulation;Iontophoresis 4mg /ml  Dexamethasone;Moist Heat;Neuromuscular re-education;Therapeutic exercise;Therapeutic activities;Functional mobility training;Patient/family education;Manual techniques;Dry needling;Passive range of motion;Taping;Joint Manipulations;Traction;Ultrasound;Balance training;Spinal Manipulations    PT Next Visit Plan Review HEP and progress PRN, manual for cervical/thoracic mobility and muscular tension, DNF endurance training, neurodynamic PRN, progress rotator cuff and periscapular strength    PT Home Exercise Plan [email or web address]    Consulted and Agree with Plan of Care Patient           Patient will benefit from skilled therapeutic intervention in order to improve the following deficits and impairments:  Decreased range of motion,Postural dysfunction,Decreased strength,Pain,Decreased activity tolerance,Impaired UE functional use,Impaired sensation,Increased muscle spasms,Impaired flexibility,Decreased endurance  Visit Diagnosis: Cervicalgia  Chronic right shoulder pain  Stiffness of right shoulder, not elsewhere classified  Muscle weakness (generalized)     Problem List Patient Active Problem List   Diagnosis Date Noted  . Genetic testing 10/02/2019  . Environmental and seasonal allergies 09/19/2019  . Family history of colon cancer   . Family history of melanoma   .  Family history of ovarian cancer   . Family history of breast cancer   . Anxiety disorder 01/09/2019  . Cervical stenosis of spine 10/21/2018  . Insomnia 10/21/2018  . Breast tenderness in female 02/05/2014  . Pes planus of both feet 01/15/2014  . Low back pain 11/23/2013  . Phlebitis 10/06/2013  . History of depression 09/26/2013  . Breast cancer of upper-outer quadrant of right female breast ([place]) 08/18/2013  . ADHD (attention deficit hyperactivity disorder) 12/01/2011  . Headache 04/03/2009  . Hypothyroidism 05/05/2007  . GERD 05/05/2007  . Osteoarthritis 05/05/2007    Hilda Blades, PT, DPT, LAT, ATC 11/26/20   12:32 PM Phone: [phone] Fax: [facility] [address] [place], [place], [address] Phone: [phone]   Fax:  [phone]  Name: Kristina Pearson MRN: [id number] Date of Birth: January 20, 1956

## 2020-11-26 NOTE — Patient Instructions (Signed)
Access Code: [email or web address] URL: [email or web address] Date: 11/26/2020 Prepared by: Hilda Blades  Exercises Shoulder External Rotation with Anchored Resistance - 1 x daily - 7 x weekly - 2 sets - 10 reps Shoulder Internal Rotation with Resistance - 1 x daily - 7 x weekly - 2 sets - 10 reps Standing Row with Anchored Resistance - 1 x daily - 7 x weekly - 2 sets - 12 reps Shoulder Extension with Resistance - 1 x daily - 7 x weekly - 2 sets - 12 reps Wall Push Up - 1 x daily - 7 x weekly - 2 sets - 12 reps Scaption with Dumbbells - 1 x daily - 7 x weekly - 2 sets - 8-10 reps Seated Shoulder External Rotation in Abduction Supported with Dumbbell - 1 x daily - 7 x weekly - 2 sets - 15 reps Sleeper Stretch - 2-3 x daily - 7 x weekly - 2 reps - 30 seconds hold Standing Shoulder Internal Rotation Stretch with Towel - 2-3 x daily - 7 x weekly - 5 reps - 10-15 seconds hold Supine Cervical Retraction with Towel - 2 x daily - 7 x weekly - 10 reps - 5 hold Doorway Pec Stretch at 90 Degrees Abduction - 2 x daily - 7 x weekly - 3 reps - 30 hold Shoulder External Rotation and Scapular Retraction with Resistance - 2 x daily - 7 x weekly - 10 reps - 3 hold Step Back Shoulder Stretch with Chair - 2 x daily - 7 x weekly - 5 reps - 5 hold - 2 sets Sidelying Thoracic Lumbar Rotation - 2 x daily - 7 x weekly - 2 sets - 5 reps - 5 hold

## 2020-12-04 ENCOUNTER — Other Ambulatory Visit: Payer: Self-pay

## 2020-12-04 ENCOUNTER — Encounter: Payer: Self-pay | Admitting: Physical Therapy

## 2020-12-04 ENCOUNTER — Ambulatory Visit: Payer: 59 | Admitting: Physical Therapy

## 2020-12-04 DIAGNOSIS — M6281 Muscle weakness (generalized): Secondary | ICD-10-CM

## 2020-12-04 DIAGNOSIS — M25611 Stiffness of right shoulder, not elsewhere classified: Secondary | ICD-10-CM

## 2020-12-04 DIAGNOSIS — M542 Cervicalgia: Secondary | ICD-10-CM

## 2020-12-04 DIAGNOSIS — M25511 Pain in right shoulder: Secondary | ICD-10-CM

## 2020-12-04 NOTE — Therapy (Signed)
[place], [place], [address] Phone: [phone]   Fax:  [phone]  Physical Therapy Treatment  Patient Details  Name: CHRISTELLE IGOE MRN: [id number] Date of Birth: [birth year] Referring Provider (PT): Currence, Gasper Sells, [place]   Encounter Date: 12/04/2020   PT End of Session - 12/04/20 1225     Visit Number 25    Number of Visits 29    Date for PT Re-Evaluation 01/07/21    Authorization Type AETNA    PT Start Time 1146   patient arrived late   PT Stop Time 1217    PT Time Calculation (min) 31 min    Activity Tolerance Patient tolerated treatment well    Behavior During Therapy [facility] for tasks assessed/performed             Past Medical History:  Diagnosis Date   ADHD    ADHD (attention deficit hyperactivity disorder)    ADD   Allergy    Anxiety    Arthritis    osteoarthritrs   Breast cancer ([place]) 08/15/13   right lateral upper outer   Depression    Family history of breast cancer    Family history of colon cancer    Family history of melanoma    Family history of ovarian cancer    Fibromyalgia    GERD (gastroesophageal reflux disease)    TUMS   Headache(784.0)    sees Dr. Gwendel Hanson at [facility]    History of depression    Hx of radiation therapy 10/19/13- 11/09/13   right breast 4256 cGy in 16 sessions, hypo-fractionated   Hypothyroid    Migraine    Osteopenia    Personal history of radiation therapy 2015   PONV (postoperative nausea and vomiting)    Sialoadenitis    Spinal stenosis    Urinary incontinence    Vitiligo    Wears glasses     Past Surgical History:  Procedure Laterality Date   BREAST BIOPSY Right 07/2013   BREAST LUMPECTOMY Right 08/2013   BREAST LUMPECTOMY WITH RADIOACTIVE SEED LOCALIZATION Right 08/30/2013   Procedure: RIGHT PARTIAL MASTECTOMY WITH RADIOACTIVE SEED LOCALIZATION;  Surgeon: [name], MD;  Location: [place];   Service: General;  Laterality: Right;   [place]   lap   COLONOSCOPY  01/22/2020   per Dr. Cristina Gong, benign polyps, repeat in 7 yrs    [place]      There were no vitals filed for this visit.   Subjective Assessment - 12/04/20 1223     Subjective Patient reports her neck is still feeling better. She does continue to having trouble looking behind her to the left when she is driving. She is scheduled to have MRI for lumbar region on 6/21 and will likely be referred to PT following that.    Patient Stated Goals Get full use of right arm and recondition her body    Currently in Pain? No/denies                               [facility] Adult PT Treatment/Exercise - 12/04/20 0001       Exercises   Exercises Neck      Neck Exercises: Supine   Neck Retraction 10 reps;5 secs  2 sets   Neck Retraction Limitations 1 set single pillow, 2nd set with "peanut" at suboccipital region      Manual Therapy   Manual Therapy Soft tissue mobilization;Myofascial release;Passive ROM    Soft tissue mobilization Upper trap and levator region in prone    Myofascial Release Suboccipital release with gentle cervical traction in supine, left upper trap and levator region    Passive ROM Upper trap and levator manual stretching in supine, manual cervical retraction in supine                    PT Education - 12/04/20 1224     Education Details HEP, using "peanut" for SMFR to suboccipital region and neck    Person(s) Educated Patient    Methods Explanation;Demonstration;Verbal cues    Comprehension Verbalized understanding;Need further instruction;Returned demonstration;Verbal cues required              PT Short Term Goals - 08/26/20 1404       PT SHORT TERM GOAL #1   Title Patient will be I with initial HEP to progress with PT    Time --    Period Weeks     Status Achieved    Target Date --      PT SHORT TERM GOAL #2   Title Patient will demonstrate PROM >/= 120 deg elevation to reduce shoulder stiffness and progress allowable range    Baseline 128    Time --    Period --    Status Achieved    Target Date --      PT SHORT TERM GOAL #3   Title Patient will report </= 5/10 pain with therapy to reduce limitation and allow progression within protocol    Time --    Period --    Status Achieved    Target Date --      PT SHORT TERM GOAL #4   Title PT will review FOTO with patient by 3rd visit    Status Achieved               PT Long Term Goals - 11/20/20 5885       PT LONG TERM GOAL #1   Title Patient will be I with final HEP to maintain progress from PT    Baseline continuing to progress HEP to include cervical spine    Time 7    Period Weeks    Status On-going    Target Date 01/07/21      PT LONG TERM GOAL #2   Title Patient will report improved functional status >/= 55% on FOTO    Baseline 49% functional status assessed 09/30/20    Time 7    Period Weeks    Status On-going    Target Date 01/07/21      PT LONG TERM GOAL #3   Title Patient will demonstrate right shoulder AROM grossly WFL to return to swimming    Baseline continue limitation with functional reach behind back    Time 7    Period Weeks    Status On-going    Target Date 01/07/21      PT LONG TERM GOAL #4   Title Patient will demonstrate right shoulder rotator cuff strength >/= 5/5 MMT to be able to lift objects into top cabinets and return to swimming    Baseline grossly 4/5 - 4+/5 MMT    Time 7    Period Weeks    Status On-going  Target Date 01/07/21      PT LONG TERM GOAL #5   Title Patient will report demonstrate >/= 55 deg cervical rotation to improve driving ability    Baseline 40-45 deg    Time 7    Period Weeks    Status New    Target Date 01/07/21      Additional Long Term Goals   Additional Long Term Goals Yes      PT LONG  TERM GOAL #6   Title Patient will report </= 2/10 cervical pain and resoluton of numbness/tingling with activity to reduce functional limitations    Baseline 8/10 with activity    Time 7    Period Weeks    Status New    Target Date 01/07/21                   Plan - 12/04/20 1321     Clinical Impression Statement Patient tolerated therapy well with no adverse effects. Therapy focused majority on manual therapy to reduce muscular tension and improve motion due to time restraint. Patient does report improvement in neck flexibility/tightness following therapy and was provided tools to use at home to perform self muscular massage/release. Patient would benefit from continued skilled PT to improve cervical mobility and strength/endurance in order to reduce pain and maximize functional ability    PT Treatment/Interventions ADLs/Self Care Home Management;Aquatic Therapy;Cryotherapy;Electrical Stimulation;Iontophoresis 4mg /ml Dexamethasone;Moist Heat;Neuromuscular re-education;Therapeutic exercise;Therapeutic activities;Functional mobility training;Patient/family education;Manual techniques;Dry needling;Passive range of motion;Taping;Joint Manipulations;Traction;Ultrasound;Balance training;Spinal Manipulations    PT Next Visit Plan Review HEP and progress PRN, manual for cervical/thoracic mobility and muscular tension, DNF endurance training, neurodynamic PRN, progress rotator cuff and periscapular strength    PT Home Exercise Plan [id number]    Consulted and Agree with Plan of Care Patient             Patient will benefit from skilled therapeutic intervention in order to improve the following deficits and impairments:  Decreased range of motion, Postural dysfunction, Decreased strength, Pain, Decreased activity tolerance, Impaired UE functional use, Impaired sensation, Increased muscle spasms, Impaired flexibility, Decreased endurance  Visit Diagnosis: Cervicalgia  Chronic right shoulder  pain  Stiffness of right shoulder, not elsewhere classified  Muscle weakness (generalized)     Problem List Patient Active Problem List   Diagnosis Date Noted   Genetic testing 10/02/2019   Environmental and seasonal allergies 09/19/2019   Family history of colon cancer    Family history of melanoma    Family history of ovarian cancer    Family history of breast cancer    Anxiety disorder 01/09/2019   Cervical stenosis of spine 10/21/2018   Insomnia 10/21/2018   Breast tenderness in female 02/05/2014   Pes planus of both feet 01/15/2014   Low back pain 11/23/2013   Phlebitis 10/06/2013   History of depression 09/26/2013   Breast cancer of upper-outer quadrant of right female breast ([place]) 08/18/2013   ADHD (attention deficit hyperactivity disorder) 12/01/2011   Headache 04/03/2009   Hypothyroidism 05/05/2007   GERD 05/05/2007   Osteoarthritis 05/05/2007    Hilda Blades, PT, DPT, LAT, ATC 12/04/20  1:25 PM Phone: [phone] Fax: [place] [facility] [address] [place], [place], [address] Phone: [phone]   Fax:  [phone]  Name: REILYNN LAURO MRN: [id number] Date of Birth: 05/06/1956

## 2020-12-06 ENCOUNTER — Ambulatory Visit (INDEPENDENT_AMBULATORY_CARE_PROVIDER_SITE_OTHER): Payer: 59 | Admitting: Family Medicine

## 2020-12-06 ENCOUNTER — Other Ambulatory Visit: Payer: Self-pay

## 2020-12-06 ENCOUNTER — Encounter: Payer: Self-pay | Admitting: Family Medicine

## 2020-12-06 VITALS — BP 124/80 | HR 96 | Temp 98.5°F | Wt 168.1 lb

## 2020-12-06 DIAGNOSIS — M5442 Lumbago with sciatica, left side: Secondary | ICD-10-CM

## 2020-12-06 DIAGNOSIS — R339 Retention of urine, unspecified: Secondary | ICD-10-CM

## 2020-12-06 DIAGNOSIS — G8929 Other chronic pain: Secondary | ICD-10-CM

## 2020-12-06 DIAGNOSIS — F900 Attention-deficit hyperactivity disorder, predominantly inattentive type: Secondary | ICD-10-CM | POA: Diagnosis not present

## 2020-12-06 DIAGNOSIS — M5441 Lumbago with sciatica, right side: Secondary | ICD-10-CM | POA: Diagnosis not present

## 2020-12-06 LAB — POC URINALSYSI DIPSTICK (AUTOMATED)
Bilirubin, UA: NEGATIVE
Blood, UA: NEGATIVE
Glucose, UA: NEGATIVE
Ketones, UA: NEGATIVE
Leukocytes, UA: NEGATIVE
Nitrite, UA: NEGATIVE
Protein, UA: NEGATIVE
Spec Grav, UA: 1.01 (ref 1.010–1.025)
Urobilinogen, UA: 0.2 E.U./dL
pH, UA: 7 (ref 5.0–8.0)

## 2020-12-06 MED ORDER — LISDEXAMFETAMINE DIMESYLATE 40 MG PO CAPS: 40.0000 mg | ORAL_CAPSULE | ORAL | 0 refills | Status: DC

## 2020-12-06 NOTE — Progress Notes (Signed)
   Subjective:    Patient ID: Kristina Pearson, female    DOB: 17-Oct-1955, 65 y.o.   MRN: [id number]  HPI Here to discuss difficulty urinating. This started a few months ago. She describes some increased frequency of urination, but there is no urgency or discomfort. She says she will urinate and then feel the need to urinate again in an hour or so. It does not wake her up at night. No fevers. Her BMs are regular. She is seeing Dr. Othelia Pulling for severe low back pains that radiate into both legs, and she is scheduled for an MRI next week. She asks if the back problem could be affecting her bladder. About 3 months ago we also increased her Vyvanse to 50 mg daily, and she thinks this may be affecting her bladder. Her UA today is clear.    Review of Systems  Constitutional: Negative.   Respiratory: Negative.    Cardiovascular: Negative.   Gastrointestinal: Negative.   Genitourinary:  Positive for difficulty urinating and frequency. Negative for dysuria, flank pain, hematuria, pelvic pain and urgency.  Musculoskeletal:  Positive for back pain.      Objective:   Physical Exam Constitutional:      General: She is not in acute distress.    Appearance: Normal appearance.  Cardiovascular:     Rate and Rhythm: Normal rate and regular rhythm.     Pulses: Normal pulses.     Heart sounds: Normal heart sounds.  Pulmonary:     Effort: Pulmonary effort is normal.     Breath sounds: Normal breath sounds.  Abdominal:     General: Abdomen is flat. Bowel sounds are normal. There is no distension.     Palpations: Abdomen is soft. There is no mass.     Tenderness: There is no abdominal tenderness. There is no right CVA tenderness, left CVA tenderness, guarding or rebound.     Hernia: No hernia is present.  Neurological:     Mental Status: She is alert.          Assessment & Plan:  Urinary frequency and possible retention. I agreed that either the back pain or the Vyvanse could be the source of her  problem. She will follow up with Dr. Othelia Pulling, and hopefully a reduction in her back pain will help her bladder function. We will also decrease the Vyvanse dose back to 40 mg daily. Recheck as needed.  [name], MD

## 2020-12-11 ENCOUNTER — Other Ambulatory Visit: Payer: Self-pay

## 2020-12-11 ENCOUNTER — Encounter: Payer: Self-pay | Admitting: Physical Therapy

## 2020-12-11 ENCOUNTER — Ambulatory Visit: Payer: 59 | Admitting: Physical Therapy

## 2020-12-11 DIAGNOSIS — M6281 Muscle weakness (generalized): Secondary | ICD-10-CM

## 2020-12-11 DIAGNOSIS — M25511 Pain in right shoulder: Secondary | ICD-10-CM

## 2020-12-11 DIAGNOSIS — M542 Cervicalgia: Secondary | ICD-10-CM

## 2020-12-11 DIAGNOSIS — M25611 Stiffness of right shoulder, not elsewhere classified: Secondary | ICD-10-CM

## 2020-12-11 NOTE — Therapy (Signed)
[place], [place], [address] Phone: [phone]   Fax:  [phone]  Physical Therapy Treatment  Patient Details  Name: Kristina Pearson MRN: [id number] Date of Birth: Sep 24, 1955 Referring Provider (PT): Clance Boll, Gasper Sells, [place]   Encounter Date: 12/11/2020   PT End of Session - 12/11/20 1224     Visit Number 26    Number of Visits 29    Date for PT Re-Evaluation 01/07/21    Authorization Type AETNA    PT Start Time 1216    PT Stop Time 6962    PT Time Calculation (min) 42 min    Activity Tolerance Patient tolerated treatment well    Behavior During Therapy [facility] for tasks assessed/performed             Past Medical History:  Diagnosis Date   ADHD    ADHD (attention deficit hyperactivity disorder)    ADD   Allergy    Anxiety    Arthritis    osteoarthritrs   Breast cancer ([place]) 08/15/13   right lateral upper outer   Depression    Family history of breast cancer    Family history of colon cancer    Family history of melanoma    Family history of ovarian cancer    Fibromyalgia    GERD (gastroesophageal reflux disease)    TUMS   Headache(784.0)    sees Dr. Gwendel Pearson at [facility]    History of depression    Hx of radiation therapy 10/19/13- 11/09/13   right breast 4256 cGy in 16 sessions, hypo-fractionated   Hypothyroid    Migraine    Osteopenia    Personal history of radiation therapy 2015   PONV (postoperative nausea and vomiting)    Sialoadenitis    Spinal stenosis    Urinary incontinence    Vitiligo    Wears glasses     Past Surgical History:  Procedure Laterality Date   BREAST BIOPSY Right 07/2013   BREAST LUMPECTOMY Right 08/2013   BREAST LUMPECTOMY WITH RADIOACTIVE SEED LOCALIZATION Right 08/30/2013   Procedure: RIGHT PARTIAL MASTECTOMY WITH RADIOACTIVE SEED LOCALIZATION;  Surgeon: [name], MD;  Location: [place];  Service: General;   Laterality: Right;   [place]   lap   COLONOSCOPY  01/22/2020   per Dr. Cristina Gong, benign polyps, repeat in 7 yrs    [name]      There were no vitals filed for this visit.   Subjective Assessment - 12/11/20 1222     Subjective Patient reports her neck is doing better and shoulder is doing well, she does has a spot on her shoulder that is tender and occasionally pops. Her lower back is giving her the most trouble, she had an MRI yesterday and is scheduled to see the spine doctor next week.    Patient Stated Goals Get full use of right arm and recondition her body    Currently in Pain? No/denies                [facility] PT Assessment - 12/11/20 0001       AROM   Cervical - Right Rotation 58    Cervical - Left Rotation 55  [place] Adult PT Treatment/Exercise - 12/11/20 0001       Exercises   Exercises Neck      Neck Exercises: Machines for Strengthening   UBE (Upper Arm Bike) L1 x 4 min (2 fwd/bwd)      Neck Exercises: Supine   Neck Retraction 10 reps;5 secs    Neck Retraction Limitations 1 set single pillow      Shoulder Exercises: Supine   Protraction 10 reps   combined with retraction   Protraction Limitations lying on FR    Horizontal ABduction 10 reps    Theraband Level (Shoulder Horizontal ABduction) Level 3 (Green)    Horizontal ABduction Limitations lying on FR    Diagonals 10 reps    Theraband Level (Shoulder Diagonals) Level 3 (Green)    Diagonals Limitations lying on FR      Manual Therapy   Manual Therapy Myofascial release;Passive ROM    Myofascial Release Suboccipital release with gentle cervical traction in supine, left upper trap and levator region    Passive ROM Upper trap and levator manual stretching in supine, manual cervical retraction in supine      Neck Exercises: Stretches   Chest  Stretch 2 reps;20 seconds    Chest Stretch Limitations supine on FR    Other Neck Stretches Sidelying thoracic rotation 10 x 5 sec each    Other Neck Stretches Seated physioball roll out 5 x 10 sec forward and side-to-side                    PT Education - 12/11/20 1224     Education Details HEP    Person(s) Educated Patient    Methods Explanation    Comprehension Verbalized understanding;Need further instruction              PT Short Term Goals - 08/26/20 1404       PT SHORT TERM GOAL #1   Title Patient will be I with initial HEP to progress with PT    Time --    Period Weeks    Status Achieved    Target Date --      PT SHORT TERM GOAL #2   Title Patient will demonstrate PROM >/= 120 deg elevation to reduce shoulder stiffness and progress allowable range    Baseline 128    Time --    Period --    Status Achieved    Target Date --      PT SHORT TERM GOAL #3   Title Patient will report </= 5/10 pain with therapy to reduce limitation and allow progression within protocol    Time --    Period --    Status Achieved    Target Date --      PT SHORT TERM GOAL #4   Title PT will review FOTO with patient by 3rd visit    Status Achieved               PT Long Term Goals - 11/20/20 4270       PT LONG TERM GOAL #1   Title Patient will be I with final HEP to maintain progress from PT    Baseline continuing to progress HEP to include cervical spine    Time 7    Period Weeks    Status On-going    Target Date 01/07/21      PT LONG TERM GOAL #2   Title Patient will report improved functional status >/= 55% on FOTO  Baseline 49% functional status assessed 09/30/20    Time 7    Period Weeks    Status On-going    Target Date 01/07/21      PT LONG TERM GOAL #3   Title Patient will demonstrate right shoulder AROM grossly WFL to return to swimming    Baseline continue limitation with functional reach behind back    Time 7    Period Weeks    Status  On-going    Target Date 01/07/21      PT LONG TERM GOAL #4   Title Patient will demonstrate right shoulder rotator cuff strength >/= 5/5 MMT to be able to lift objects into top cabinets and return to swimming    Baseline grossly 4/5 - 4+/5 MMT    Time 7    Period Weeks    Status On-going    Target Date 01/07/21      PT LONG TERM GOAL #5   Title Patient will report demonstrate >/= 55 deg cervical rotation to improve driving ability    Baseline 40-45 deg    Time 7    Period Weeks    Status New    Target Date 01/07/21      Additional Long Term Goals   Additional Long Term Goals Yes      PT LONG TERM GOAL #6   Title Patient will report </= 2/10 cervical pain and resoluton of numbness/tingling with activity to reduce functional limitations    Baseline 8/10 with activity    Time 7    Period Weeks    Status New    Target Date 01/07/21                   Plan - 12/11/20 1224     Clinical Impression Statement Patient tolerated therapy well with no adverse effects. Continued focus on manual and postural strengthening for neck and shoulder. Incorpoated foam roller for supine exercises to further engage core and postural muscles while reduce neck tension. No changes made to HEP this visit. She does exhibit improvement in neck rotation this visit. Patient would benefit from continued skilled PT to improve cervical mobility and strength/endurance in order to reduce pain and maximize functional ability    PT Treatment/Interventions ADLs/Self Care Home Management;Aquatic Therapy;Cryotherapy;Electrical Stimulation;Iontophoresis 4mg /ml Dexamethasone;Moist Heat;Neuromuscular re-education;Therapeutic exercise;Therapeutic activities;Functional mobility training;Patient/family education;Manual techniques;Dry needling;Passive range of motion;Taping;Joint Manipulations;Traction;Ultrasound;Balance training;Spinal Manipulations    PT Next Visit Plan Review HEP and progress PRN, manual for  cervical/thoracic mobility and muscular tension, DNF endurance training, neurodynamic PRN, progress rotator cuff and periscapular strength    PT Home Exercise Plan [id number]    Consulted and Agree with Plan of Care Patient             Patient will benefit from skilled therapeutic intervention in order to improve the following deficits and impairments:  Decreased range of motion, Postural dysfunction, Decreased strength, Pain, Decreased activity tolerance, Impaired UE functional use, Impaired sensation, Increased muscle spasms, Impaired flexibility, Decreased endurance  Visit Diagnosis: Cervicalgia  Muscle weakness (generalized)  Chronic right shoulder pain  Stiffness of right shoulder, not elsewhere classified     Problem List Patient Active Problem List   Diagnosis Date Noted   Genetic testing 10/02/2019   Environmental and seasonal allergies 09/19/2019   Family history of colon cancer    Family history of melanoma    Family history of ovarian cancer    Family history of breast cancer    Anxiety disorder 01/09/2019  Cervical stenosis of spine 10/21/2018   Insomnia 10/21/2018   Breast tenderness in female 02/05/2014   Pes planus of both feet 01/15/2014   Low back pain 11/23/2013   Phlebitis 10/06/2013   History of depression 09/26/2013   Breast cancer of upper-outer quadrant of right female breast ([place]) 08/18/2013   ADHD (attention deficit hyperactivity disorder) 12/01/2011   Headache 04/03/2009   Hypothyroidism 05/05/2007   GERD 05/05/2007   Osteoarthritis 05/05/2007    Hilda Blades, PT, DPT, LAT, ATC 12/11/20  1:13 PM Phone: [phone] Fax: [place] [facility] [address] [place], [place], [address] Phone: [phone]   Fax:  [phone]  Name: ASHLIEGH PAREKH MRN: [id number] Date of Birth: February 22, 1956

## 2020-12-17 ENCOUNTER — Other Ambulatory Visit: Payer: Self-pay

## 2020-12-18 ENCOUNTER — Other Ambulatory Visit: Payer: Self-pay | Admitting: Family Medicine

## 2020-12-18 ENCOUNTER — Ambulatory Visit (INDEPENDENT_AMBULATORY_CARE_PROVIDER_SITE_OTHER): Payer: 59 | Admitting: Family Medicine

## 2020-12-18 ENCOUNTER — Encounter: Payer: Self-pay | Admitting: Family Medicine

## 2020-12-18 ENCOUNTER — Ambulatory Visit: Payer: 59 | Admitting: Physical Therapy

## 2020-12-18 ENCOUNTER — Encounter: Payer: Self-pay | Admitting: Physical Therapy

## 2020-12-18 ENCOUNTER — Other Ambulatory Visit: Payer: Self-pay

## 2020-12-18 VITALS — BP 122/78 | HR 88 | Temp 98.8°F | Wt 169.0 lb

## 2020-12-18 DIAGNOSIS — M5441 Lumbago with sciatica, right side: Secondary | ICD-10-CM

## 2020-12-18 DIAGNOSIS — M6281 Muscle weakness (generalized): Secondary | ICD-10-CM

## 2020-12-18 DIAGNOSIS — M542 Cervicalgia: Secondary | ICD-10-CM | POA: Diagnosis not present

## 2020-12-18 DIAGNOSIS — M545 Low back pain, unspecified: Secondary | ICD-10-CM

## 2020-12-18 DIAGNOSIS — M25611 Stiffness of right shoulder, not elsewhere classified: Secondary | ICD-10-CM

## 2020-12-18 DIAGNOSIS — G8929 Other chronic pain: Secondary | ICD-10-CM | POA: Diagnosis not present

## 2020-12-18 DIAGNOSIS — M25511 Pain in right shoulder: Secondary | ICD-10-CM

## 2020-12-18 MED ORDER — CELECOXIB 200 MG PO CAPS: 200.0000 mg | ORAL_CAPSULE | Freq: Every day | ORAL | 3 refills | Status: DC

## 2020-12-18 MED ORDER — TRAMADOL HCL 50 MG PO TABS: 100.0000 mg | ORAL_TABLET | Freq: Four times a day (QID) | ORAL | 0 refills | Status: DC | PRN

## 2020-12-18 NOTE — Therapy (Signed)
[facility] [place], [place], [address] Phone: [phone]   Fax:  [phone]  Physical Therapy Re-evaluation and Treatment  Patient Details  Name: PAIZLEIGH WILDS MRN: [id number] Date of Birth: [birth year] Referring Provider (PT): Norva Karvonen, MD   Encounter Date: 12/18/2020   PT End of Session - 12/18/20 1320     Visit Number 27    Number of Visits 33    Date for PT Re-Evaluation 01/29/21    Authorization Type AETNA    PT Start Time 1221    PT Stop Time 0938    PT Time Calculation (min) 44 min    Activity Tolerance Patient tolerated treatment well    Behavior During Therapy [facility] for tasks assessed/performed             Past Medical History:  Diagnosis Date   ADHD    ADHD (attention deficit hyperactivity disorder)    ADD   Allergy    Anxiety    Arthritis    osteoarthritrs   Breast cancer ([facility]) 08/15/13   right lateral upper outer   Depression    Family history of breast cancer    Family history of colon cancer    Family history of melanoma    Family history of ovarian cancer    Fibromyalgia    GERD (gastroesophageal reflux disease)    TUMS   Headache(784.0)    sees Dr. Gwendel Hanson at [facility]    History of depression    Hx of radiation therapy 10/19/13- 11/09/13   right breast 4256 cGy in 16 sessions, hypo-fractionated   Hypothyroid    Migraine    Osteopenia    Personal history of radiation therapy 2015   PONV (postoperative nausea and vomiting)    Sialoadenitis    Spinal stenosis    Urinary incontinence    Vitiligo    Wears glasses     Past Surgical History:  Procedure Laterality Date   BREAST BIOPSY Right 07/2013   BREAST LUMPECTOMY Right 08/2013   BREAST LUMPECTOMY WITH RADIOACTIVE SEED LOCALIZATION Right 08/30/2013   Procedure: RIGHT PARTIAL MASTECTOMY WITH RADIOACTIVE SEED LOCALIZATION;  Surgeon: [name], MD;  Location: [facility];   Service: General;  Laterality: Right;   [place]   lap   COLONOSCOPY  01/22/2020   per Dr. Cristina Gong, benign polyps, repeat in 7 yrs    [place] Switch      There were no vitals filed for this visit.   Subjective Assessment - 12/18/20 1226     Subjective Patient reports she has new referral for lower back pain so would like to include this in treatment. Patient reports pain located right lower back and hip/gluteal region, and it is also running down the back of the right leg that sometimes reach to heel and toes. She states she is waiting for insurance authorization to get injection. This has been ongoing issue and recently worsening. She had to take pain medication last night toget comfortable. Patient states bending forward helps to stretch the area and she frequently will pull her knee to her chest and sleep with both legs bent.    Limitations Sitting;Lifting;Standing;Walking;House hold activities    Diagnostic tests MRI lumbar    Patient Stated Goals Improve neck and lower back pain    Currently in Pain?  Yes    Pain Score 10-Worst pain ever    Pain Location Back    Pain Orientation Right;Lower    Pain Descriptors / Indicators Aching;Sharp;Shooting    Pain Type Chronic pain    Pain Radiating Towards right lower back, gluteal/hip region, back of right leg    Pain Onset More than a month ago    Pain Frequency Constant    Aggravating Factors  Constant pain, sitting extended periods    Pain Relieving Factors Medication                [facility] PT Assessment - 12/18/20 0001       Assessment   Medical Diagnosis Right rotator cuff repair, Neck pain, low back pain    Referring Provider (PT) Norva Karvonen, MD      Precautions   Precautions None      Restrictions   Weight Bearing Restrictions No      Balance Screen   Has the patient fallen in the past 6 months No     Has the patient had a decrease in activity level because of a fear of falling?  No    Is the patient reluctant to leave their home because of a fear of falling?  No      Prior Function   Level of Independence Independent      Cognition   Overall Cognitive Status Within Functional Limits for tasks assessed      Observation/Other Assessments   Focus on Therapeutic Outcomes (FOTO)  Will reassess next visit to include lumbar      AROM   AROM Assessment Site Lumbar    Lumbar Flexion WFL - patient reports pain reduction    Lumbar Extension 50% - patient reports pain exacerbation    Lumbar - Right Rotation 50%    Lumbar - Left Rotation 50%      PROM   Overall PROM Comments Hip PROM grossly [facility]      Strength   Strength Assessment Site Hip    Right/Left Hip Left;Right    Right Hip Flexion 4/5    Right Hip Extension 3-/5    Right Hip ABduction 3-/5    Left Hip Flexion 4/5    Left Hip Extension 3/5    Left Hip ABduction 3/5      Flexibility   Soft Tissue Assessment /Muscle Length yes    Quadriceps Hip flexor tightness on right      Palpation   Spinal mobility CPAs WFL with increased localized and right referred pain    Palpation comment TTP right lumbar paraspinals, SIJ region      Special Tests   Other special tests Long sit test positive for right anterior rotation (long to neutral)                           [facility] Adult PT Treatment/Exercise - 12/18/20 0001       Exercises   Exercises Lumbar      Lumbar Exercises: Stretches   Hip Flexor Stretch 2 reps;30 seconds    Hip Flexor Stretch Limitations right only, modified thomas knee to chest      Lumbar Exercises: Standing   Other Standing Lumbar Exercises Bent over hip extension x 5   performed as alternative to prone     Lumbar Exercises: Supine   Other Supine Lumbar Exercises Muscule energy 5 x 6 sec hold, left hip flexion and right hip extension  right leg extended cued to push down and dig heel into  table, left hip at 90 with hip flexion into hands     Lumbar Exercises: Sidelying   Clam 15 reps      Lumbar Exercises: Prone   Straight Leg Raise 10 reps;3 seconds    Straight Leg Raises Limitations 2 pillows placed under lower abdomen/pelvis                    PT Education - 12/18/20 1318     Education Details Updated POC to include lumbar, possible etiology of symptoms, HEP update    Person(s) Educated Patient    Methods Explanation;Demonstration;Tactile cues;Verbal cues;Handout    Comprehension Verbalized understanding;Returned demonstration;Verbal cues required;Tactile cues required;Need further instruction              PT Short Term Goals - 08/26/20 1404       PT SHORT TERM GOAL #1   Title Patient will be I with initial HEP to progress with PT    Time --    Period Weeks    Status Achieved    Target Date --      PT SHORT TERM GOAL #2   Title Patient will demonstrate PROM >/= 120 deg elevation to reduce shoulder stiffness and progress allowable range    Baseline 128    Time --    Period --    Status Achieved    Target Date --      PT SHORT TERM GOAL #3   Title Patient will report </= 5/10 pain with therapy to reduce limitation and allow progression within protocol    Time --    Period --    Status Achieved    Target Date --      PT SHORT TERM GOAL #4   Title PT will review FOTO with patient by 3rd visit    Status Achieved               PT Long Term Goals - 12/18/20 1329       PT LONG TERM GOAL #1   Title Patient will be I with final HEP to maintain progress from PT    Baseline continuing to progress HEP to include lumbar spine    Time 6    Period Weeks    Status On-going    Target Date 01/29/21      PT LONG TERM GOAL #2   Title Patient will report improved functional status >/= 55% on FOTO    Baseline did not reassess FOTO this visit, will reassess next visit to include lumbar spine    Time 6    Period Weeks    Status On-going     Target Date 01/29/21      PT LONG TERM GOAL #3   Title Patient will demonstrate right shoulder AROM grossly WFL to return to swimming    Baseline continue limitation with functional reach behind back,  not reassessed this visit    Time 6    Period Weeks    Status On-going    Target Date 01/29/21      PT LONG TERM GOAL #4   Title Patient will demonstrate right shoulder rotator cuff strength >/= 5/5 MMT to be able to lift objects into top cabinets and return to swimming    Baseline grossly 4/5 - 4+/5 MMT,  not reassessed this visit    Time 6    Period Weeks    Status On-going  Target Date 01/29/21      PT LONG TERM GOAL #5   Title Patient will report demonstrate >/= 55 deg cervical rotation to improve driving ability    Baseline 40-45 deg, not reassessed this visit    Time 6    Period Weeks    Status On-going    Target Date 01/29/21      Additional Long Term Goals   Additional Long Term Goals Yes      PT LONG TERM GOAL #6   Title Patient will report </= 2/10 cervical pain and resoluton of numbness/tingling with activity to reduce functional limitations    Baseline patient currently with no neck pain    Time 6    Period Weeks    Status On-going    Target Date 01/29/21      PT LONG TERM GOAL #7   Title Patient will report low back/hip pain </= 2/10 in order to improve sitting and walking tolerance    Baseline patient reports 10/10    Time 6    Period Weeks    Status New    Target Date 01/29/21      PT LONG TERM GOAL #8   Title Patient will exhibit hip strength >/= 4/5 MMT to reduce pain and lumbar compensation with household activities    Baseline 3-/5 MMT    Time 6    Period Weeks    Status New    Target Date 01/29/21                   Plan - 12/18/20 1321     Clinical Impression Statement Re-eval performed this visit to formally assess lumbar and add to POC. Patient's current symptoms seem multifactorial, consistent with lumbar stenosis and SIJ  related pain. Patient did exhibit a directional preference for flexion, and she reported she was already doing exercises to encourage flexion bias such as standing forward bend and knee to chest so she was encouraged to continue performing this exercises at home. Patient also exhibits TTP to SIJ region and anterior rotation of right pelvis with long sitting test so provided patient muscle energy technique for right extension and left flexion. She did report improvement in right lower back and hip symptoms following treatment. Patient also demonstrated other impairments including hip strength deficit and hip flexor tightness on right. She would benefit from continued skilled PT to address listed impairments in order to reduce low back pain, as well as continued treatment for neck and right shoulder to maximize functional ability.    Examination-Activity Limitations Reach Overhead;Sit;Sleep;Stand;Lift;Hygiene/Grooming;Dressing;Carry;Bathing;Locomotion Level    Examination-Participation Restrictions Meal Prep;Cleaning;Community Activity;Driving;Shop;Laundry;Yard Work    Rehab Potential Good    PT Frequency 1x / week    PT Duration 6 weeks    PT Treatment/Interventions ADLs/Self Care Home Management;Aquatic Therapy;Cryotherapy;Electrical Stimulation;Iontophoresis 4mg /ml Dexamethasone;Moist Heat;Neuromuscular re-education;Therapeutic exercise;Therapeutic activities;Functional mobility training;Patient/family education;Manual techniques;Dry needling;Passive range of motion;Taping;Joint Manipulations;Traction;Ultrasound;Balance training;Spinal Manipulations    PT Next Visit Plan Review HEP and progress PRN, continue muscle energy PRN, progress core and hip strengthening, manual for cervical/thoracic/lumbar mobility and muscular tension, DNF endurance training, neurodynamic PRN, progress rotator cuff and periscapular strength    PT Home Exercise Plan [id number]    Consulted and Agree with Plan of Care Patient              Patient will benefit from skilled therapeutic intervention in order to improve the following deficits and impairments:  Decreased range of motion, Postural dysfunction, Decreased strength, Pain, Decreased activity  tolerance, Impaired UE functional use, Impaired sensation, Increased muscle spasms, Impaired flexibility, Decreased endurance  Visit Diagnosis: Chronic bilateral low back pain, unspecified whether sciatica present  Cervicalgia  Muscle weakness (generalized)  Chronic right shoulder pain  Stiffness of right shoulder, not elsewhere classified     Problem List Patient Active Problem List   Diagnosis Date Noted   Genetic testing 10/02/2019   Environmental and seasonal allergies 09/19/2019   Family history of colon cancer    Family history of melanoma    Family history of ovarian cancer    Family history of breast cancer    Anxiety disorder 01/09/2019   Cervical stenosis of spine 10/21/2018   Insomnia 10/21/2018   Breast tenderness in female 02/05/2014   Pes planus of both feet 01/15/2014   Low back pain 11/23/2013   Phlebitis 10/06/2013   History of depression 09/26/2013   Breast cancer of upper-outer quadrant of right female breast ([place]) 08/18/2013   ADHD (attention deficit hyperactivity disorder) 12/01/2011   Headache 04/03/2009   Hypothyroidism 05/05/2007   GERD 05/05/2007   Osteoarthritis 05/05/2007    Hilda Blades, PT, DPT, LAT, ATC 12/18/20  1:46 PM Phone: [phone] Fax: [facility] [address] [place], [place], [address] Phone: [phone]   Fax:  [phone]  Name: KAHLI MAYON MRN: [id number] Date of Birth: 06-07-1956

## 2020-12-18 NOTE — Progress Notes (Signed)
   Subjective:    Patient ID: Kristina Pearson, female    DOB: 1956-04-26, 65 y.o.   MRN: [id number]  HPI Here for low back and right hip pain. She recently saw Dr. Othelia Pulling for this and shehad an MRI which showed facet arthropathy and degenerative discs. He explained that surgery was not an option for her. He did order PT, and she has had one session of this. As far as medications, he advised her to see [place]. She is currently taking Celebrex 200 mg BID but she still has a lot of pain, particularly at night. She has taken a few Oxycodone at night, but this makes her feel groggy the next day.    Review of Systems  Constitutional: Negative.   Respiratory: Negative.    Cardiovascular: Negative.   Musculoskeletal:  Positive for arthralgias and back pain.      Objective:   Physical Exam Constitutional:      Appearance: Normal appearance.  Cardiovascular:     Rate and Rhythm: Normal rate and regular rhythm.     Heart sounds: Normal heart sounds.  Pulmonary:     Effort: Pulmonary effort is normal.     Breath sounds: Normal breath sounds.  Neurological:     Mental Status: She is alert.          Assessment & Plan:  Low back pain. We will refill her Celebrex but we will decrease this once taking only one a day. She can supplement this with Tramadol 50-100 mg as needed. She will continue the PT. [name], MD

## 2020-12-30 ENCOUNTER — Ambulatory Visit: Payer: 59 | Attending: Physician Assistant | Admitting: Physical Therapy

## 2020-12-30 ENCOUNTER — Encounter: Payer: Self-pay | Admitting: Physical Therapy

## 2020-12-30 ENCOUNTER — Other Ambulatory Visit: Payer: Self-pay

## 2020-12-30 DIAGNOSIS — M542 Cervicalgia: Secondary | ICD-10-CM | POA: Insufficient documentation

## 2020-12-30 DIAGNOSIS — M545 Low back pain, unspecified: Secondary | ICD-10-CM | POA: Insufficient documentation

## 2020-12-30 DIAGNOSIS — M25511 Pain in right shoulder: Secondary | ICD-10-CM | POA: Diagnosis present

## 2020-12-30 DIAGNOSIS — M6281 Muscle weakness (generalized): Secondary | ICD-10-CM | POA: Insufficient documentation

## 2020-12-30 DIAGNOSIS — M25611 Stiffness of right shoulder, not elsewhere classified: Secondary | ICD-10-CM | POA: Insufficient documentation

## 2020-12-30 DIAGNOSIS — G8929 Other chronic pain: Secondary | ICD-10-CM | POA: Insufficient documentation

## 2020-12-30 NOTE — Therapy (Signed)
[place], [place], [address] Phone: [phone]   Fax:  [phone]  Physical Therapy Treatment  Patient Details  Name: Kristina Pearson MRN: [id number] Date of Birth: Jan 01, 1956 Referring Provider (PT): Norva Karvonen, MD   Encounter Date: 12/30/2020   PT End of Session - 12/30/20 1147     Visit Number 28    Number of Visits 33    Date for PT Re-Evaluation 01/29/21    Authorization Type AETNA    Authorization Time Period FOTO by 16th visit    PT Start Time 1141   patient arrived late   PT Stop Time 1215    PT Time Calculation (min) 34 min    Activity Tolerance Patient tolerated treatment well    Behavior During Therapy [facility] for tasks assessed/performed             Past Medical History:  Diagnosis Date   ADHD    ADHD (attention deficit hyperactivity disorder)    ADD   Allergy    Anxiety    Arthritis    osteoarthritrs   Breast cancer ([place]) 08/15/13   right lateral upper outer   Depression    Family history of breast cancer    Family history of colon cancer    Family history of melanoma    Family history of ovarian cancer    Fibromyalgia    GERD (gastroesophageal reflux disease)    TUMS   Headache(784.0)    sees Dr. Gwendel Hanson at [facility]    History of depression    Hx of radiation therapy 10/19/13- 11/09/13   right breast 4256 cGy in 16 sessions, hypo-fractionated   Hypothyroid    Migraine    Osteopenia    Personal history of radiation therapy 2015   PONV (postoperative nausea and vomiting)    Sialoadenitis    Spinal stenosis    Urinary incontinence    Vitiligo    Wears glasses     Past Surgical History:  Procedure Laterality Date   BREAST BIOPSY Right 07/2013   BREAST LUMPECTOMY Right 08/2013   BREAST LUMPECTOMY WITH RADIOACTIVE SEED LOCALIZATION Right 08/30/2013   Procedure: RIGHT PARTIAL MASTECTOMY WITH RADIOACTIVE SEED LOCALIZATION;  Surgeon: [name], MD;  Location: [place];  Service: General;  Laterality: Right;   [place]   lap   COLONOSCOPY  01/22/2020   per Dr. Cristina Gong, benign polyps, repeat in 7 yrs    DIAGNOSTIC LAPAROSCOPY  1982   exp   LAPAROSCOPIC [place]      There were no vitals filed for this visit.   Subjective Assessment - 12/30/20 1142     Subjective Patient reports she was released by her surgeon for shoulder, and she was able to go swimming recently and that really felt well. She did notive a little discomfort in her right shoulder this morning but this has resolved. She is scheduled for a lumbar injection tomorrow.    Patient Stated Goals Improve neck and lower back pain    Currently in Pain? Yes    Pain Score 5     Pain Location Back    Pain Orientation Lower;Right    Pain Descriptors / Indicators Aching;Sharp    Pain Type Chronic pain    Pain Onset More than a month ago    Pain Frequency Intermittent  [facility] PT Assessment - 12/30/20 0001       AROM   Right Shoulder Internal Rotation --   reach to T8     Flexibility   Quadriceps Hip flexor tightness on right                           [facility] Adult PT Treatment/Exercise - 12/30/20 0001       Exercises   Exercises Lumbar      Lumbar Exercises: Stretches   Single Knee to Chest Stretch 2 reps;20 seconds    Lower Trunk Rotation 3 reps;10 seconds    Hip Flexor Stretch 2 reps;30 seconds    Hip Flexor Stretch Limitations right only, modified thomas knee to chest    Piriformis Stretch 2 reps;20 seconds      Lumbar Exercises: Aerobic   Nustep L5 x 3 min with UE/LE while taking subjective      Lumbar Exercises: Supine   Bridge 5 reps;5 seconds   3 sets   Other Supine Lumbar Exercises Muscule energy 5 x 6 sec hold, left hip flexion and right hip extension   right leg extended cued to push down and dig heel into table, left hip  at 90 with hip flexion into hands     Lumbar Exercises: Sidelying   Clam 10 reps   2 sets   Clam Limitations yellow band      Shoulder Exercises: Stretch   Other Shoulder Stretches Reviewed sleeper stretch and IR behind back stretch using towel to continue improving reach behind back                    PT Education - 12/30/20 1146     Education Details HEP update    Person(s) Educated Patient    Methods Explanation;Demonstration;Verbal cues;Handout    Comprehension Verbalized understanding;Returned demonstration;Verbal cues required;Need further instruction              PT Short Term Goals - 08/26/20 1404       PT SHORT TERM GOAL #1   Title Patient will be I with initial HEP to progress with PT    Time --    Period Weeks    Status Achieved    Target Date --      PT SHORT TERM GOAL #2   Title Patient will demonstrate PROM >/= 120 deg elevation to reduce shoulder stiffness and progress allowable range    Baseline 128    Time --    Period --    Status Achieved    Target Date --      PT SHORT TERM GOAL #3   Title Patient will report </= 5/10 pain with therapy to reduce limitation and allow progression within protocol    Time --    Period --    Status Achieved    Target Date --      PT SHORT TERM GOAL #4   Title PT will review FOTO with patient by 3rd visit    Status Achieved               PT Long Term Goals - 12/18/20 1329       PT LONG TERM GOAL #1   Title Patient will be I with final HEP to maintain progress from PT    Baseline continuing to progress HEP to include lumbar spine    Time 6    Period Weeks    Status On-going  Target Date 01/29/21      PT LONG TERM GOAL #2   Title Patient will report improved functional status >/= 55% on FOTO    Baseline did not reassess FOTO this visit, will reassess next visit to include lumbar spine    Time 6    Period Weeks    Status On-going    Target Date 01/29/21      PT LONG TERM GOAL #3    Title Patient will demonstrate right shoulder AROM grossly WFL to return to swimming    Baseline continue limitation with functional reach behind back,  not reassessed this visit    Time 6    Period Weeks    Status On-going    Target Date 01/29/21      PT LONG TERM GOAL #4   Title Patient will demonstrate right shoulder rotator cuff strength >/= 5/5 MMT to be able to lift objects into top cabinets and return to swimming    Baseline grossly 4/5 - 4+/5 MMT,  not reassessed this visit    Time 6    Period Weeks    Status On-going    Target Date 01/29/21      PT LONG TERM GOAL #5   Title Patient will report demonstrate >/= 55 deg cervical rotation to improve driving ability    Baseline 40-45 deg, not reassessed this visit    Time 6    Period Weeks    Status On-going    Target Date 01/29/21      Additional Long Term Goals   Additional Long Term Goals Yes      PT LONG TERM GOAL #6   Title Patient will report </= 2/10 cervical pain and resoluton of numbness/tingling with activity to reduce functional limitations    Baseline patient currently with no neck pain    Time 6    Period Weeks    Status On-going    Target Date 01/29/21      PT LONG TERM GOAL #7   Title Patient will report low back/hip pain </= 2/10 in order to improve sitting and walking tolerance    Baseline patient reports 10/10    Time 6    Period Weeks    Status New    Target Date 01/29/21      PT LONG TERM GOAL #8   Title Patient will exhibit hip strength >/= 4/5 MMT to reduce pain and lumbar compensation with household activities    Baseline 3-/5 MMT    Time 6    Period Weeks    Status New    Target Date 01/29/21                   Plan - 12/30/20 1147     Clinical Impression Statement Patient tolerated therapy well with no adverse effects. She was reporting the exercises and MET have improved symptoms. She was able to tolerate light progression of hip/core strengthening this visit. She did require  cueing for proper lumbopelvic control with bridges. Updated HEP with good tolerance. Patient is scheduled for lumbar injection tomorrow so will assess her response next visit. She would benefit from continued skilled PT to address listed impairments in order to reduce low back pain, as well as continued treatment for neck and right shoulder to maximize functional ability.    PT Treatment/Interventions ADLs/Self Care Home Management;Aquatic Therapy;Cryotherapy;Electrical Stimulation;Iontophoresis 73m/ml Dexamethasone;Moist Heat;Neuromuscular re-education;Therapeutic exercise;Therapeutic activities;Functional mobility training;Patient/family education;Manual techniques;Dry needling;Passive range of motion;Taping;Joint Manipulations;Traction;Ultrasound;Balance training;Spinal Manipulations  PT Next Visit Plan Review HEP and progress PRN, continue muscle energy PRN, progress core and hip strengthening, manual for cervical/thoracic/lumbar mobility and muscular tension, DNF endurance training, neurodynamic PRN, progress rotator cuff and periscapular strength    PT Home Exercise Plan [email or web address]    Consulted and Agree with Plan of Care Patient             Patient will benefit from skilled therapeutic intervention in order to improve the following deficits and impairments:  Decreased range of motion, Postural dysfunction, Decreased strength, Pain, Decreased activity tolerance, Impaired UE functional use, Impaired sensation, Increased muscle spasms, Impaired flexibility, Decreased endurance  Visit Diagnosis: Chronic bilateral low back pain, unspecified whether sciatica present  Cervicalgia  Muscle weakness (generalized)  Chronic right shoulder pain  Stiffness of right shoulder, not elsewhere classified     Problem List Patient Active Problem List   Diagnosis Date Noted   Genetic testing 10/02/2019   Environmental and seasonal allergies 09/19/2019   Family history of colon cancer    Family  history of melanoma    Family history of ovarian cancer    Family history of breast cancer    Anxiety disorder 01/09/2019   Cervical stenosis of spine 10/21/2018   Insomnia 10/21/2018   Breast tenderness in female 02/05/2014   Pes planus of both feet 01/15/2014   Low back pain 11/23/2013   Phlebitis 10/06/2013   History of depression 09/26/2013   Breast cancer of upper-outer quadrant of right female breast ([place]) 08/18/2013   ADHD (attention deficit hyperactivity disorder) 12/01/2011   Headache 04/03/2009   Hypothyroidism 05/05/2007   GERD 05/05/2007   Osteoarthritis 05/05/2007    Kristina Pearson, PT, DPT, LAT, ATC 12/30/20  1:51 PM Phone: [phone] Fax: [place] [facility] [address] [place], [place], [address] Phone: [phone]   Fax:  [phone]  Name: Kristina Pearson MRN: [id number] Date of Birth: May 23, 1956

## 2020-12-30 NOTE — Patient Instructions (Signed)
Access Code: [email or web address] URL: [email or web address] Date: 12/30/2020 Prepared by: Hilda Blades  Exercises Shoulder External Rotation with Anchored Resistance - 1 x daily - 7 x weekly - 2 sets - 10 reps Shoulder Internal Rotation with Resistance - 1 x daily - 7 x weekly - 2 sets - 10 reps Standing Row with Anchored Resistance - 1 x daily - 7 x weekly - 2 sets - 12 reps Shoulder Extension with Resistance - 1 x daily - 7 x weekly - 2 sets - 12 reps Wall Push Up - 1 x daily - 7 x weekly - 2 sets - 12 reps Scaption with Dumbbells - 1 x daily - 7 x weekly - 2 sets - 8-10 reps Seated Shoulder External Rotation in Abduction Supported with Dumbbell - 1 x daily - 7 x weekly - 2 sets - 15 reps Sleeper Stretch - 2-3 x daily - 7 x weekly - 2 reps - 30 seconds hold Standing Shoulder Internal Rotation Stretch with Towel - 2-3 x daily - 7 x weekly - 5 reps - 10-15 seconds hold Supine Cervical Retraction with Towel - 2 x daily - 7 x weekly - 10 reps - 5 hold Doorway Pec Stretch at 90 Degrees Abduction - 2 x daily - 7 x weekly - 3 reps - 30 hold Shoulder External Rotation and Scapular Retraction with Resistance - 2 x daily - 7 x weekly - 10 reps - 3 hold Step Back Shoulder Stretch with Chair - 2 x daily - 7 x weekly - 5 reps - 5 hold - 2 sets Sidelying Thoracic Lumbar Rotation - 2 x daily - 7 x weekly - 2 sets - 5 reps - 5 hold Prone Hip Extension - Two Pillows - 1-2 x daily - 7 x weekly - 10 reps - 3 hold Prone Hip Extension on Table - 1-2 x daily - 7 x weekly - 10 reps - 3 hold Supine SI Joint Self-Correction - 2-3 x daily - 7 x weekly - 5-10 reps - 6 hold Hip Flexor Stretch at Edge of Bed - 1-2 x daily - 7 x weekly - 2 reps - 60 hold Bridge - 1 x daily - 7 x weekly - 2-3 sets - 5 reps - 5 hold Clam with Resistance - 1 x daily - 7 x weekly - 2-3 sets - 10 reps

## 2020-12-31 ENCOUNTER — Encounter: Payer: Self-pay | Admitting: Physical Therapy

## 2021-01-06 ENCOUNTER — Other Ambulatory Visit: Payer: Self-pay

## 2021-01-06 ENCOUNTER — Encounter: Payer: Self-pay | Admitting: Physical Therapy

## 2021-01-06 ENCOUNTER — Ambulatory Visit: Payer: 59 | Admitting: Physical Therapy

## 2021-01-06 DIAGNOSIS — M6281 Muscle weakness (generalized): Secondary | ICD-10-CM

## 2021-01-06 DIAGNOSIS — M545 Low back pain, unspecified: Secondary | ICD-10-CM

## 2021-01-06 DIAGNOSIS — G8929 Other chronic pain: Secondary | ICD-10-CM

## 2021-01-06 DIAGNOSIS — M542 Cervicalgia: Secondary | ICD-10-CM

## 2021-01-06 DIAGNOSIS — M25611 Stiffness of right shoulder, not elsewhere classified: Secondary | ICD-10-CM

## 2021-01-06 NOTE — Therapy (Signed)
[place], [place], [address] Phone: [phone]   Fax:  [phone]  Physical Therapy Treatment  Patient Details  Name: Kristina Pearson MRN: [id number] Date of Birth: 1955/07/03 Referring Provider (PT): Norva Karvonen, MD   Encounter Date: 01/06/2021   PT End of Session - 01/06/21 1410     Visit Number 29    Number of Visits 33    Date for PT Re-Evaluation 01/29/21    Authorization Type AETNA    PT Start Time 7035    PT Stop Time 0093    PT Time Calculation (min) 40 min    Activity Tolerance Patient tolerated treatment well    Behavior During Therapy [facility] for tasks assessed/performed             Past Medical History:  Diagnosis Date   ADHD    ADHD (attention deficit hyperactivity disorder)    ADD   Allergy    Anxiety    Arthritis    osteoarthritrs   Breast cancer ([place]) 08/15/13   right lateral upper outer   Depression    Family history of breast cancer    Family history of colon cancer    Family history of melanoma    Family history of ovarian cancer    Fibromyalgia    GERD (gastroesophageal reflux disease)    TUMS   Headache(784.0)    sees Dr. Gwendel Hanson at [facility]    History of depression    Hx of radiation therapy 10/19/13- 11/09/13   right breast 4256 cGy in 16 sessions, hypo-fractionated   Hypothyroid    Migraine    Osteopenia    Personal history of radiation therapy 2015   PONV (postoperative nausea and vomiting)    Sialoadenitis    Spinal stenosis    Urinary incontinence    Vitiligo    Wears glasses     Past Surgical History:  Procedure Laterality Date   BREAST BIOPSY Right 07/2013   BREAST LUMPECTOMY Right 08/2013   BREAST LUMPECTOMY WITH RADIOACTIVE SEED LOCALIZATION Right 08/30/2013   Procedure: RIGHT PARTIAL MASTECTOMY WITH RADIOACTIVE SEED LOCALIZATION;  Surgeon: [name], MD;  Location: [place];  Service: General;   Laterality: Right;   [place]   lap   COLONOSCOPY  01/22/2020   per Dr. Cristina Gong, benign polyps, repeat in 7 yrs    [place]      There were no vitals filed for this visit.   Subjective Assessment - 01/06/21 1404     Subjective Patient reports she has continued to swim, still has trouble with her back stroke on the right. States that the kicking with freestyle seems to aggravate the right hip/lower back. She did not get the injection for her lower back due to insurance denial. States the right lower back and hip sort of bother her more when she is sitting extended periods.    Patient Stated Goals Improve neck and lower back pain    Currently in Pain? Yes    Pain Score 5     Pain Location Back    Pain Orientation Lower;Right    Pain Descriptors / Indicators Aching;Sharp    Pain Type Chronic pain    Pain Onset More than a month ago    Pain Frequency Intermittent  [facility] PT Assessment - 01/06/21 0001       Strength   Right Hip Extension 3+/5    Right Hip ABduction 3+/5    Left Hip Extension 3+/5    Left Hip ABduction 3+/5                           [facility] Adult PT Treatment/Exercise - 01/06/21 0001       Lumbar Exercises: Stretches   Other Lumbar Stretch Exercise Child's pose stretch 2 x 20 sec      Lumbar Exercises: Standing   Lifting 10 reps   3 sets   Lifting Weights (lbs) 15    Lifting Limitations deadlift from 8" box      Lumbar Exercises: Supine   Dead Bug 10 reps    Bridge 5 reps   2 sets, 10 sec hold   Other Supine Lumbar Exercises Muscule energy 5 x 10 sec hold, left hip flexion and right hip extension   right leg extended cued to push down and dig heel into table, left hip at 90 with hip flexion into hands   Other Supine Lumbar Exercises 90-90 alternating leg extensions 2 x 10      Lumbar Exercises: Sidelying    Hip Abduction 10 reps   2 sets     Lumbar Exercises: Prone   Straight Leg Raise 10 reps;3 seconds    Straight Leg Raises Limitations 1 pillow under lower abdomen      Lumbar Exercises: Quadruped   Opposite Arm/Leg Raise 10 reps;3 seconds   2 sets                   PT Education - 01/06/21 1410     Education Details HEP    Person(s) Educated Patient    Methods Explanation;Demonstration;Verbal cues    Comprehension Verbalized understanding;Returned demonstration;Verbal cues required;Need further instruction              PT Short Term Goals - 08/26/20 1404       PT SHORT TERM GOAL #1   Title Patient will be I with initial HEP to progress with PT    Time --    Period Weeks    Status Achieved    Target Date --      PT SHORT TERM GOAL #2   Title Patient will demonstrate PROM >/= 120 deg elevation to reduce shoulder stiffness and progress allowable range    Baseline 128    Time --    Period --    Status Achieved    Target Date --      PT SHORT TERM GOAL #3   Title Patient will report </= 5/10 pain with therapy to reduce limitation and allow progression within protocol    Time --    Period --    Status Achieved    Target Date --      PT SHORT TERM GOAL #4   Title PT will review FOTO with patient by 3rd visit    Status Achieved               PT Long Term Goals - 12/18/20 1329       PT LONG TERM GOAL #1   Title Patient will be I with final HEP to maintain progress from PT    Baseline continuing to progress HEP to include lumbar spine    Time 6    Period Weeks    Status  On-going    Target Date 01/29/21      PT LONG TERM GOAL #2   Title Patient will report improved functional status >/= 55% on FOTO    Baseline did not reassess FOTO this visit, will reassess next visit to include lumbar spine    Time 6    Period Weeks    Status On-going    Target Date 01/29/21      PT LONG TERM GOAL #3   Title Patient will demonstrate right shoulder AROM  grossly WFL to return to swimming    Baseline continue limitation with functional reach behind back,  not reassessed this visit    Time 6    Period Weeks    Status On-going    Target Date 01/29/21      PT LONG TERM GOAL #4   Title Patient will demonstrate right shoulder rotator cuff strength >/= 5/5 MMT to be able to lift objects into top cabinets and return to swimming    Baseline grossly 4/5 - 4+/5 MMT,  not reassessed this visit    Time 6    Period Weeks    Status On-going    Target Date 01/29/21      PT LONG TERM GOAL #5   Title Patient will report demonstrate >/= 55 deg cervical rotation to improve driving ability    Baseline 40-45 deg, not reassessed this visit    Time 6    Period Weeks    Status On-going    Target Date 01/29/21      Additional Long Term Goals   Additional Long Term Goals Yes      PT LONG TERM GOAL #6   Title Patient will report </= 2/10 cervical pain and resoluton of numbness/tingling with activity to reduce functional limitations    Baseline patient currently with no neck pain    Time 6    Period Weeks    Status On-going    Target Date 01/29/21      PT LONG TERM GOAL #7   Title Patient will report low back/hip pain </= 2/10 in order to improve sitting and walking tolerance    Baseline patient reports 10/10    Time 6    Period Weeks    Status New    Target Date 01/29/21      PT LONG TERM GOAL #8   Title Patient will exhibit hip strength >/= 4/5 MMT to reduce pain and lumbar compensation with household activities    Baseline 3-/5 MMT    Time 6    Period Weeks    Status New    Target Date 01/29/21                   Plan - 01/06/21 1411     Clinical Impression Statement Patient tolerated therapy well with no adverse effects. Therapy continued to focus on progression of hip strength and core stabilization. Initiated light lifting this visit with good tolerance. She did require occasional cueing for technique and maintaining neutral  lumbar spine. Progressed HEP to include dead bug ans bird dog exercise. She would benefit from continued skilled PT to progress mobility and strength in order to reduce low back pain, as well as continued treatment for neck and right shoulder to maximize functional ability.    PT Treatment/Interventions ADLs/Self Care Home Management;Aquatic Therapy;Cryotherapy;Electrical Stimulation;Iontophoresis 4mg /ml Dexamethasone;Moist Heat;Neuromuscular re-education;Therapeutic exercise;Therapeutic activities;Functional mobility training;Patient/family education;Manual techniques;Dry needling;Passive range of motion;Taping;Joint Manipulations;Traction;Ultrasound;Balance training;Spinal Manipulations    PT Next Visit Plan  Review HEP and progress PRN, continue muscle energy PRN, progress core and hip strengthening, manual for cervical/thoracic/lumbar mobility and muscular tension, DNF endurance training, neurodynamic PRN, progress rotator cuff and periscapular strength    PT Home Exercise Plan [email or web address]    Consulted and Agree with Plan of Care Patient             Patient will benefit from skilled therapeutic intervention in order to improve the following deficits and impairments:  Decreased range of motion, Postural dysfunction, Decreased strength, Pain, Decreased activity tolerance, Impaired UE functional use, Impaired sensation, Increased muscle spasms, Impaired flexibility, Decreased endurance  Visit Diagnosis: Chronic bilateral low back pain, unspecified whether sciatica present  Cervicalgia  Muscle weakness (generalized)  Chronic right shoulder pain  Stiffness of right shoulder, not elsewhere classified     Problem List Patient Active Problem List   Diagnosis Date Noted   Genetic testing 10/02/2019   Environmental and seasonal allergies 09/19/2019   Family history of colon cancer    Family history of melanoma    Family history of ovarian cancer    Family history of breast cancer     Anxiety disorder 01/09/2019   Cervical stenosis of spine 10/21/2018   Insomnia 10/21/2018   Breast tenderness in female 02/05/2014   Pes planus of both feet 01/15/2014   Low back pain 11/23/2013   Phlebitis 10/06/2013   History of depression 09/26/2013   Breast cancer of upper-outer quadrant of right female breast ([place]) 08/18/2013   ADHD (attention deficit hyperactivity disorder) 12/01/2011   Headache 04/03/2009   Hypothyroidism 05/05/2007   GERD 05/05/2007   Osteoarthritis 05/05/2007    Hilda Blades, PT, DPT, LAT, ATC 01/06/21  2:48 PM Phone: [phone] Fax: [place] [facility] [address] [place], [place], [address] Phone: [phone]   Fax:  [phone]  Name: Kristina Pearson MRN: [id number] Date of Birth: 27-Mar-1956

## 2021-01-13 ENCOUNTER — Encounter: Payer: 59 | Admitting: Physical Therapy

## 2021-01-14 ENCOUNTER — Ambulatory Visit: Payer: 59 | Admitting: Physical Therapy

## 2021-01-14 ENCOUNTER — Other Ambulatory Visit: Payer: Self-pay

## 2021-01-14 ENCOUNTER — Encounter: Payer: Self-pay | Admitting: Physical Therapy

## 2021-01-14 DIAGNOSIS — M545 Low back pain, unspecified: Secondary | ICD-10-CM

## 2021-01-14 DIAGNOSIS — M542 Cervicalgia: Secondary | ICD-10-CM

## 2021-01-14 DIAGNOSIS — G8929 Other chronic pain: Secondary | ICD-10-CM

## 2021-01-14 DIAGNOSIS — M6281 Muscle weakness (generalized): Secondary | ICD-10-CM

## 2021-01-14 DIAGNOSIS — M25611 Stiffness of right shoulder, not elsewhere classified: Secondary | ICD-10-CM

## 2021-01-14 NOTE — Therapy (Signed)
[place], [place], [address] Phone: [phone]   Fax:  [phone]  Physical Therapy Treatment  Patient Details  Name: Kristina Pearson MRN: [id number] Date of Birth: Jun 16, 1956 Referring Provider (PT): Norva Karvonen, MD   Encounter Date: 01/14/2021   PT End of Session - 01/14/21 1448     Visit Number 30    Number of Visits 33    Date for PT Re-Evaluation 01/29/21    Authorization Type AETNA    PT Start Time 1448    PT Stop Time [id number]    PT Time Calculation (min) 42 min    Activity Tolerance Patient tolerated treatment well    Behavior During Therapy [facility] for tasks assessed/performed             Past Medical History:  Diagnosis Date   ADHD    ADHD (attention deficit hyperactivity disorder)    ADD   Allergy    Anxiety    Arthritis    osteoarthritrs   Breast cancer ([place]) 08/15/13   right lateral upper outer   Depression    Family history of breast cancer    Family history of colon cancer    Family history of melanoma    Family history of ovarian cancer    Fibromyalgia    GERD (gastroesophageal reflux disease)    TUMS   Headache(784.0)    sees Dr. Gwendel Hanson at [facility]    History of depression    Hx of radiation therapy 10/19/13- 11/09/13   right breast 4256 cGy in 16 sessions, hypo-fractionated   Hypothyroid    Migraine    Osteopenia    Personal history of radiation therapy 2015   PONV (postoperative nausea and vomiting)    Sialoadenitis    Spinal stenosis    Urinary incontinence    Vitiligo    Wears glasses     Past Surgical History:  Procedure Laterality Date   BREAST BIOPSY Right 07/2013   BREAST LUMPECTOMY Right 08/2013   BREAST LUMPECTOMY WITH RADIOACTIVE SEED LOCALIZATION Right 08/30/2013   Procedure: RIGHT PARTIAL MASTECTOMY WITH RADIOACTIVE SEED LOCALIZATION;  Surgeon: [name], MD;  Location: [place];  Service: General;   Laterality: Right;   [place]   lap   COLONOSCOPY  01/22/2020   per Dr. Cristina Gong, benign polyps, repeat in 7 yrs    [place]      There were no vitals filed for this visit.   Subjective Assessment - 01/14/21 1449     Subjective Patient reports the lower back pain still wakes her up at night and she has to take pain medication, and she does not want to take the pain medication indefinitely. She does report that she has been swimming and feels better with this, states her right arm is feeling stronger with the back stroke.    Patient Stated Goals Improve neck and lower back pain    Currently in Pain? Yes    Pain Score 5     Pain Location Back    Pain Orientation Lower;Right    Pain Descriptors / Indicators Aching;Sharp    Pain Type Chronic pain    Pain Onset More than a month ago    Pain Frequency Intermittent  [facility] PT Assessment - 01/14/21 0001       Strength   Right Hip Extension 3+/5    Right Hip ABduction 3+/5                           [facility] Adult PT Treatment/Exercise - 01/14/21 0001       Exercises   Exercises Lumbar      Lumbar Exercises: Stretches   Passive Hamstring Stretch 3 reps;20 seconds    Passive Hamstring Stretch Limitations supine PROM    Lower Trunk Rotation 5 reps;10 seconds    Hip Flexor Stretch 3 reps;30 seconds    Hip Flexor Stretch Limitations right only, modified thomas knee to chest    Piriformis Stretch 3 reps;20 seconds    Piriformis Stretch Limitations supine PROM      Lumbar Exercises: Aerobic   Nustep L5 x 5 min with UE/LE while taking subjective      Lumbar Exercises: Supine   Bridge 5 reps;5 seconds   2 sets     Lumbar Exercises: Sidelying   Clam 10 reps;3 seconds   2 sets   Clam Limitations red band      Lumbar Exercises: Quadruped   Opposite Arm/Leg Raise 10 reps;3 seconds    2 sets     Manual Therapy   Manual Therapy Manual Traction    Manual Traction Right LAD x 3 bouts                    PT Education - 01/14/21 1448     Education Details HEP    Person(s) Educated Patient    Methods Explanation;Demonstration;Verbal cues    Comprehension Verbalized understanding;Returned demonstration;Verbal cues required;Need further instruction              PT Short Term Goals - 08/26/20 1404       PT SHORT TERM GOAL #1   Title Patient will be I with initial HEP to progress with PT    Time --    Period Weeks    Status Achieved    Target Date --      PT SHORT TERM GOAL #2   Title Patient will demonstrate PROM >/= 120 deg elevation to reduce shoulder stiffness and progress allowable range    Baseline 128    Time --    Period --    Status Achieved    Target Date --      PT SHORT TERM GOAL #3   Title Patient will report </= 5/10 pain with therapy to reduce limitation and allow progression within protocol    Time --    Period --    Status Achieved    Target Date --      PT SHORT TERM GOAL #4   Title PT will review FOTO with patient by 3rd visit    Status Achieved               PT Long Term Goals - 12/18/20 1329       PT LONG TERM GOAL #1   Title Patient will be I with final HEP to maintain progress from PT    Baseline continuing to progress HEP to include lumbar spine    Time 6    Period Weeks    Status On-going    Target Date 01/29/21      PT LONG TERM GOAL #2   Title Patient will report improved functional status >/= 55%  on FOTO    Baseline did not reassess FOTO this visit, will reassess next visit to include lumbar spine    Time 6    Period Weeks    Status On-going    Target Date 01/29/21      PT LONG TERM GOAL #3   Title Patient will demonstrate right shoulder AROM grossly WFL to return to swimming    Baseline continue limitation with functional reach behind back,  not reassessed this visit    Time 6    Period  Weeks    Status On-going    Target Date 01/29/21      PT LONG TERM GOAL #4   Title Patient will demonstrate right shoulder rotator cuff strength >/= 5/5 MMT to be able to lift objects into top cabinets and return to swimming    Baseline grossly 4/5 - 4+/5 MMT,  not reassessed this visit    Time 6    Period Weeks    Status On-going    Target Date 01/29/21      PT LONG TERM GOAL #5   Title Patient will report demonstrate >/= 55 deg cervical rotation to improve driving ability    Baseline 40-45 deg, not reassessed this visit    Time 6    Period Weeks    Status On-going    Target Date 01/29/21      Additional Long Term Goals   Additional Long Term Goals Yes      PT LONG TERM GOAL #6   Title Patient will report </= 2/10 cervical pain and resoluton of numbness/tingling with activity to reduce functional limitations    Baseline patient currently with no neck pain    Time 6    Period Weeks    Status On-going    Target Date 01/29/21      PT LONG TERM GOAL #7   Title Patient will report low back/hip pain </= 2/10 in order to improve sitting and walking tolerance    Baseline patient reports 10/10    Time 6    Period Weeks    Status New    Target Date 01/29/21      PT LONG TERM GOAL #8   Title Patient will exhibit hip strength >/= 4/5 MMT to reduce pain and lumbar compensation with household activities    Baseline 3-/5 MMT    Time 6    Period Weeks    Status New    Target Date 01/29/21                   Plan - 01/14/21 1455     Clinical Impression Statement Patient tolerated therapy well with no adverse effects. She arrived reporting right lower back was a little more flared up this visit so incorporated more stretching and manual to reduce pain and allow for improved ability to exercise. She did report less pain and improved mobility following therapy. Patient seemed to respond best to manual LAD to the right side. No change made to HEP this visit.  She would benefit  from continued skilled PT to progress mobility and strength in order to reduce low back pain, as well as continued treatment for neck and right shoulder to maximize functional ability.    PT Treatment/Interventions ADLs/Self Care Home Management;Aquatic Therapy;Cryotherapy;Electrical Stimulation;Iontophoresis '4mg'$ /ml Dexamethasone;Moist Heat;Neuromuscular re-education;Therapeutic exercise;Therapeutic activities;Functional mobility training;Patient/family education;Manual techniques;Dry needling;Passive range of motion;Taping;Joint Manipulations;Traction;Ultrasound;Balance training;Spinal Manipulations    PT Next Visit Plan Review HEP and progress PRN, continue muscle energy PRN, progress  core and hip strengthening, manual for cervical/thoracic/lumbar mobility and muscular tension, DNF endurance training, neurodynamic PRN, progress rotator cuff and periscapular strength    PT Home Exercise Plan [id number]    Consulted and Agree with Plan of Care Patient             Patient will benefit from skilled therapeutic intervention in order to improve the following deficits and impairments:  Decreased range of motion, Postural dysfunction, Decreased strength, Pain, Decreased activity tolerance, Impaired UE functional use, Impaired sensation, Increased muscle spasms, Impaired flexibility, Decreased endurance  Visit Diagnosis: Chronic bilateral low back pain, unspecified whether sciatica present  Cervicalgia  Muscle weakness (generalized)  Chronic right shoulder pain  Stiffness of right shoulder, not elsewhere classified     Problem List Patient Active Problem List   Diagnosis Date Noted   Genetic testing 10/02/2019   Environmental and seasonal allergies 09/19/2019   Family history of colon cancer    Family history of melanoma    Family history of ovarian cancer    Family history of breast cancer    Anxiety disorder 01/09/2019   Cervical stenosis of spine 10/21/2018   Insomnia 10/21/2018    Breast tenderness in female 02/05/2014   Pes planus of both feet 01/15/2014   Low back pain 11/23/2013   Phlebitis 10/06/2013   History of depression 09/26/2013   Breast cancer of upper-outer quadrant of right female breast ([place]) 08/18/2013   ADHD (attention deficit hyperactivity disorder) 12/01/2011   Headache 04/03/2009   Hypothyroidism 05/05/2007   GERD 05/05/2007   Osteoarthritis 05/05/2007    Kristina Pearson, PT, DPT, LAT, ATC 01/14/21  4:17 PM Phone: [phone] Fax: [facility] [address] [place], [place], [address] Phone: [phone]   Fax:  [phone]  Name: Kristina Pearson MRN: [id number] Date of Birth: Jun 15, 1956

## 2021-01-20 ENCOUNTER — Encounter: Payer: Self-pay | Admitting: Physical Therapy

## 2021-01-20 ENCOUNTER — Other Ambulatory Visit: Payer: Self-pay

## 2021-01-20 ENCOUNTER — Ambulatory Visit: Payer: 59 | Attending: Physician Assistant | Admitting: Physical Therapy

## 2021-01-20 DIAGNOSIS — M545 Low back pain, unspecified: Secondary | ICD-10-CM | POA: Insufficient documentation

## 2021-01-20 DIAGNOSIS — M542 Cervicalgia: Secondary | ICD-10-CM | POA: Insufficient documentation

## 2021-01-20 DIAGNOSIS — M6281 Muscle weakness (generalized): Secondary | ICD-10-CM | POA: Insufficient documentation

## 2021-01-20 DIAGNOSIS — M25611 Stiffness of right shoulder, not elsewhere classified: Secondary | ICD-10-CM | POA: Diagnosis present

## 2021-01-20 DIAGNOSIS — G8929 Other chronic pain: Secondary | ICD-10-CM | POA: Insufficient documentation

## 2021-01-20 DIAGNOSIS — M25511 Pain in right shoulder: Secondary | ICD-10-CM | POA: Insufficient documentation

## 2021-01-20 NOTE — Therapy (Signed)
[place], [place], [address] Phone: [phone]   Fax:  [phone]  Physical Therapy Treatment  Patient Details  Name: Kristina Pearson MRN: [id number] Date of Birth: [birth year] Referring Provider (PT): Norva Karvonen, MD   Encounter Date: 01/20/2021   PT End of Session - 01/20/21 1413     Visit Number 31    Number of Visits 33    Date for PT Re-Evaluation 01/29/21    Authorization Type AETNA    PT Start Time 1409    PT Stop Time [id number]    PT Time Calculation (min) 36 min    Activity Tolerance Patient tolerated treatment well    Behavior During Therapy [facility] for tasks assessed/performed             Past Medical History:  Diagnosis Date   ADHD    ADHD (attention deficit hyperactivity disorder)    ADD   Allergy    Anxiety    Arthritis    osteoarthritrs   Breast cancer ([place]) 08/15/13   right lateral upper outer   Depression    Family history of breast cancer    Family history of colon cancer    Family history of melanoma    Family history of ovarian cancer    Fibromyalgia    GERD (gastroesophageal reflux disease)    TUMS   Headache(784.0)    sees Dr. Gwendel Hanson at [facility]    History of depression    Hx of radiation therapy 10/19/13- 11/09/13   right breast 4256 cGy in 16 sessions, hypo-fractionated   Hypothyroid    Migraine    Osteopenia    Personal history of radiation therapy 2015   PONV (postoperative nausea and vomiting)    Sialoadenitis    Spinal stenosis    Urinary incontinence    Vitiligo    Wears glasses     Past Surgical History:  Procedure Laterality Date   BREAST BIOPSY Right 07/2013   BREAST LUMPECTOMY Right 08/2013   BREAST LUMPECTOMY WITH RADIOACTIVE SEED LOCALIZATION Right 08/30/2013   Procedure: RIGHT PARTIAL MASTECTOMY WITH RADIOACTIVE SEED LOCALIZATION;  Surgeon: [name], MD;  Location: [place];  Service: General;   Laterality: Right;   [place]   lap   COLONOSCOPY  01/22/2020   per Dr. Cristina Gong, benign polyps, repeat in 7 yrs    [place]      There were no vitals filed for this visit.   Subjective Assessment - 01/20/21 1410     Subjective Patient reports she saw the hip doctor and had an injection in her right hip and that is feeling better. She continues to have some right sided lower back pain.    Patient Stated Goals Improve neck and lower back pain    Currently in Pain? Yes    Pain Score 2     Pain Location Back    Pain Orientation Lower;Right    Pain Descriptors / Indicators Aching;Sharp    Pain Type Chronic pain    Pain Onset More than a month ago    Pain Frequency Intermittent                [facility] PT Assessment - 01/20/21 0001       Strength   Right Hip Extension 4-/5  Right Hip ABduction 4-/5                           [facility] Adult PT Treatment/Exercise - 01/20/21 0001       Exercises   Exercises Lumbar      Lumbar Exercises: Stretches   Piriformis Stretch 2 reps;20 seconds    Piriformis Stretch Limitations supine    Other Lumbar Stretch Exercise Child's pose stretch 2 x 20 sec fwd and lat each      Lumbar Exercises: Aerobic   Nustep L5 x 5 min with UE/LE while taking subjective      Lumbar Exercises: Standing   Other Standing Lumbar Exercises Pallof press with green 2 x 10 each      Lumbar Exercises: Supine   Bridge 10 reps;5 seconds   2 sets   Other Supine Lumbar Exercises 90-90 alternating leg extensions 2 x 10 with physioball squeeze      Lumbar Exercises: Sidelying   Clam 15 reps;3 seconds   2 sets   Clam Limitations red band      Lumbar Exercises: Quadruped   Opposite Arm/Leg Raise 10 reps;5 seconds   2 sets                   PT Education - 01/20/21 1413     Education Details HEP    Person(s) Educated  Patient    Methods Explanation;Demonstration;Verbal cues    Comprehension Verbalized understanding;Returned demonstration;Verbal cues required;Need further instruction              PT Short Term Goals - 08/26/20 1404       PT SHORT TERM GOAL #1   Title Patient will be I with initial HEP to progress with PT    Time --    Period Weeks    Status Achieved    Target Date --      PT SHORT TERM GOAL #2   Title Patient will demonstrate PROM >/= 120 deg elevation to reduce shoulder stiffness and progress allowable range    Baseline 128    Time --    Period --    Status Achieved    Target Date --      PT SHORT TERM GOAL #3   Title Patient will report </= 5/10 pain with therapy to reduce limitation and allow progression within protocol    Time --    Period --    Status Achieved    Target Date --      PT SHORT TERM GOAL #4   Title PT will review FOTO with patient by 3rd visit    Status Achieved               PT Long Term Goals - 12/18/20 1329       PT LONG TERM GOAL #1   Title Patient will be I with final HEP to maintain progress from PT    Baseline continuing to progress HEP to include lumbar spine    Time 6    Period Weeks    Status On-going    Target Date 01/29/21      PT LONG TERM GOAL #2   Title Patient will report improved functional status >/= 55% on FOTO    Baseline did not reassess FOTO this visit, will reassess next visit to include lumbar spine    Time 6    Period Weeks    Status On-going    Target  Date 01/29/21      PT LONG TERM GOAL #3   Title Patient will demonstrate right shoulder AROM grossly WFL to return to swimming    Baseline continue limitation with functional reach behind back,  not reassessed this visit    Time 6    Period Weeks    Status On-going    Target Date 01/29/21      PT LONG TERM GOAL #4   Title Patient will demonstrate right shoulder rotator cuff strength >/= 5/5 MMT to be able to lift objects into top cabinets and return  to swimming    Baseline grossly 4/5 - 4+/5 MMT,  not reassessed this visit    Time 6    Period Weeks    Status On-going    Target Date 01/29/21      PT LONG TERM GOAL #5   Title Patient will report demonstrate >/= 55 deg cervical rotation to improve driving ability    Baseline 40-45 deg, not reassessed this visit    Time 6    Period Weeks    Status On-going    Target Date 01/29/21      Additional Long Term Goals   Additional Long Term Goals Yes      PT LONG TERM GOAL #6   Title Patient will report </= 2/10 cervical pain and resoluton of numbness/tingling with activity to reduce functional limitations    Baseline patient currently with no neck pain    Time 6    Period Weeks    Status On-going    Target Date 01/29/21      PT LONG TERM GOAL #7   Title Patient will report low back/hip pain </= 2/10 in order to improve sitting and walking tolerance    Baseline patient reports 10/10    Time 6    Period Weeks    Status New    Target Date 01/29/21      PT LONG TERM GOAL #8   Title Patient will exhibit hip strength >/= 4/5 MMT to reduce pain and lumbar compensation with household activities    Baseline 3-/5 MMT    Time 6    Period Weeks    Status New    Target Date 01/29/21                   Plan - 01/20/21 1414     Clinical Impression Statement Patient tolerated therapy well with no adverse effects. She had an injection to what seems like right greater trochanteric bursa since last week which has improved right hip symptoms. She was able to tolerate strengthening much better this visit and did not report any increase in right lower back or leg pain. No changes made to HEP this visit. She would benefit from continued skilled PT to progress mobility and strength in order to reduce low back pain, as well as continued treatment for neck and right shoulder to maximize functional ability.    PT Treatment/Interventions ADLs/Self Care Home Management;Aquatic  Therapy;Cryotherapy;Electrical Stimulation;Iontophoresis '4mg'$ /ml Dexamethasone;Moist Heat;Neuromuscular re-education;Therapeutic exercise;Therapeutic activities;Functional mobility training;Patient/family education;Manual techniques;Dry needling;Passive range of motion;Taping;Joint Manipulations;Traction;Ultrasound;Balance training;Spinal Manipulations    PT Next Visit Plan Review HEP and progress PRN, continue muscle energy PRN, progress core and hip strengthening, manual for cervical/thoracic/lumbar mobility and muscular tension, DNF endurance training, neurodynamic PRN, progress rotator cuff and periscapular strength    PT Home Exercise Plan [id number]    Consulted and Agree with Plan of Care Patient  Patient will benefit from skilled therapeutic intervention in order to improve the following deficits and impairments:  Decreased range of motion, Postural dysfunction, Decreased strength, Pain, Decreased activity tolerance, Impaired UE functional use, Impaired sensation, Increased muscle spasms, Impaired flexibility, Decreased endurance  Visit Diagnosis: Chronic bilateral low back pain, unspecified whether sciatica present  Cervicalgia  Muscle weakness (generalized)  Chronic right shoulder pain  Stiffness of right shoulder, not elsewhere classified     Problem List Patient Active Problem List   Diagnosis Date Noted   Genetic testing 10/02/2019   Environmental and seasonal allergies 09/19/2019   Family history of colon cancer    Family history of melanoma    Family history of ovarian cancer    Family history of breast cancer    Anxiety disorder 01/09/2019   Cervical stenosis of spine 10/21/2018   Insomnia 10/21/2018   Breast tenderness in female 02/05/2014   Pes planus of both feet 01/15/2014   Low back pain 11/23/2013   Phlebitis 10/06/2013   History of depression 09/26/2013   Breast cancer of upper-outer quadrant of right female breast ([place]) 08/18/2013   ADHD  (attention deficit hyperactivity disorder) 12/01/2011   Headache 04/03/2009   Hypothyroidism 05/05/2007   GERD 05/05/2007   Osteoarthritis 05/05/2007    Hilda Blades, PT, DPT, LAT, ATC 01/20/21  2:49 PM Phone: [phone] Fax: [place] [facility] [address] [place], [place], [address] Phone: [phone]   Fax:  [phone]  Name: JAELIANNA SANON MRN: [id number] Date of Birth: [birth year]

## 2021-01-27 ENCOUNTER — Other Ambulatory Visit: Payer: Self-pay

## 2021-01-27 ENCOUNTER — Encounter: Payer: Self-pay | Admitting: Physical Therapy

## 2021-01-27 ENCOUNTER — Ambulatory Visit: Payer: 59 | Admitting: Physical Therapy

## 2021-01-27 ENCOUNTER — Telehealth: Payer: Self-pay | Admitting: Family Medicine

## 2021-01-27 DIAGNOSIS — M545 Low back pain, unspecified: Secondary | ICD-10-CM

## 2021-01-27 DIAGNOSIS — M25511 Pain in right shoulder: Secondary | ICD-10-CM

## 2021-01-27 DIAGNOSIS — M6281 Muscle weakness (generalized): Secondary | ICD-10-CM

## 2021-01-27 DIAGNOSIS — M542 Cervicalgia: Secondary | ICD-10-CM

## 2021-01-27 DIAGNOSIS — M25611 Stiffness of right shoulder, not elsewhere classified: Secondary | ICD-10-CM

## 2021-01-27 NOTE — Therapy (Signed)
[place], [place], [address] Phone: [phone]   Fax:  [phone]  Physical Therapy Treatment / ERO  Patient Details  Name: Kristina Pearson MRN: [id number] Date of Birth: 1956/02/28 Referring Provider (PT): Norva Karvonen, MD   Encounter Date: 01/27/2021   PT End of Session - 01/27/21 1406     Visit Number 32    Number of Visits 38    Date for PT Re-Evaluation 03/10/21    Authorization Type AETNA    PT Start Time 1400    PT Stop Time [id number]    PT Time Calculation (min) 45 min    Activity Tolerance Patient tolerated treatment well    Behavior During Therapy [facility] for tasks assessed/performed             Past Medical History:  Diagnosis Date   ADHD    ADHD (attention deficit hyperactivity disorder)    ADD   Allergy    Anxiety    Arthritis    osteoarthritrs   Breast cancer ([place]) 08/15/13   right lateral upper outer   Depression    Family history of breast cancer    Family history of colon cancer    Family history of melanoma    Family history of ovarian cancer    Fibromyalgia    GERD (gastroesophageal reflux disease)    TUMS   Headache(784.0)    sees Dr. Gwendel Hanson at [facility]    History of depression    Hx of radiation therapy 10/19/13- 11/09/13   right breast 4256 cGy in 16 sessions, hypo-fractionated   Hypothyroid    Migraine    Osteopenia    Personal history of radiation therapy 2015   PONV (postoperative nausea and vomiting)    Sialoadenitis    Spinal stenosis    Urinary incontinence    Vitiligo    Wears glasses     Past Surgical History:  Procedure Laterality Date   BREAST BIOPSY Right 07/2013   BREAST LUMPECTOMY Right 08/2013   BREAST LUMPECTOMY WITH RADIOACTIVE SEED LOCALIZATION Right 08/30/2013   Procedure: RIGHT PARTIAL MASTECTOMY WITH RADIOACTIVE SEED LOCALIZATION;  Surgeon: [name], MD;  Location: [facility];  Service: General;   Laterality: Right;   [place]   lap   COLONOSCOPY  01/22/2020   per Dr. Cristina Gong, benign polyps, repeat in 7 yrs    [place]      There were no vitals filed for this visit.   Subjective Assessment - 01/27/21 1402     Subjective Patient reports she is doing well. She has cut her pain medication in half and is now only taking one muscle relaxer. She is planning to have an SI injection in a few weeks.    Patient Stated Goals Improve neck and lower back pain    Currently in Pain? Yes    Pain Score 4     Pain Location Back    Pain Orientation Right;Lower    Pain Descriptors / Indicators Aching;Sharp    Pain Type Chronic pain    Pain Onset More than a month ago    Pain Frequency Constant                [facility] PT Assessment - 01/27/21 0001       Assessment  Medical Diagnosis Right rotator cuff repair, Neck pain, low back pain    Referring Provider (PT) Lynann Bologna, Herminio Commons, MD      Precautions   Precautions None      Restrictions   Weight Bearing Restrictions No      Balance Screen   Has the patient fallen in the past 6 months No      Prior Function   Level of Independence Independent      Observation/Other Assessments   Focus on Therapeutic Outcomes (FOTO)  Discharged from FOTO      AROM   Cervical - Right Rotation 60    Cervical - Left Rotation 62      Strength   Right Shoulder Flexion 5/5    Right Shoulder ABduction 5/5    Right Shoulder Internal Rotation 5/5    Right Shoulder External Rotation 5/5    Right Hip Extension 4-/5    Right Hip ABduction 4-/5    Left Hip Extension 4-/5    Left Hip ABduction 4-/5                           [facility] Adult PT Treatment/Exercise - 01/27/21 0001       Self-Care   Self-Care Other Self-Care Comments    Other Self-Care Comments  POC update and progress to goals, HEP      Exercises    Exercises Lumbar      Lumbar Exercises: Stretches   Hip Flexor Stretch 2 reps;30 seconds    Hip Flexor Stretch Limitations right only, modified thomas knee to chest    Other Lumbar Stretch Exercise Child's pose stretch 2 x 20 sec fwd and lat each      Lumbar Exercises: Aerobic   Nustep L5 x 5 min with UE/LE while taking subjective      Lumbar Exercises: Standing   Lifting 10 reps   2 sets   Lifting Weights (lbs) 25    Lifting Limitations deadlift from 8" box    Shoulder Extension 20 reps   2 sets   Theraband Level (Shoulder Extension) Level 3 (Green)    Other Standing Lumbar Exercises Pallof press with green 2 x 20 each      Lumbar Exercises: Supine   Bridge 10 reps;5 seconds    Other Supine Lumbar Exercises 90-90 alternating leg extensions 2 x 20 with physioball squeeze      Lumbar Exercises: Sidelying   Clam 15 reps;3 seconds   2 sets   Clam Limitations green band      Lumbar Exercises: Quadruped   Opposite Arm/Leg Raise 10 reps;5 seconds   2 sets                   PT Education - 01/27/21 1405     Education Details POC and HEP update    Person(s) Educated Patient    Methods Explanation;Demonstration;Verbal cues;Handout    Comprehension Verbalized understanding;Returned demonstration;Verbal cues required;Need further instruction              PT Short Term Goals - 08/26/20 1404       PT SHORT TERM GOAL #1   Title Patient will be I with initial HEP to progress with PT    Time --    Period Weeks    Status Achieved    Target Date --      PT SHORT TERM GOAL #2   Title Patient will demonstrate PROM >/= 120 deg  elevation to reduce shoulder stiffness and progress allowable range    Baseline 128    Time --    Period --    Status Achieved    Target Date --      PT SHORT TERM GOAL #3   Title Patient will report </= 5/10 pain with therapy to reduce limitation and allow progression within protocol    Time --    Period --    Status Achieved    Target  Date --      PT SHORT TERM GOAL #4   Title PT will review FOTO with patient by 3rd visit    Status Achieved               PT Long Term Goals - 01/27/21 1414       PT LONG TERM GOAL #1   Title Patient will be I with final HEP to maintain progress from PT    Baseline continuing to progress HEP to include lumbar spine    Time 6    Period Weeks    Status On-going    Target Date 03/10/21      PT LONG TERM GOAL #2   Title Patient will report improved functional status >/= 55% on FOTO    Baseline FOTO for shoulder discharged - 01/27/2021    Time 6    Period Weeks    Status Deferred      PT LONG TERM GOAL #3   Title Patient will demonstrate right shoulder AROM grossly WFL to return to swimming    Baseline patient demonstrates right shoulder AROM grossly WFL    Time 6    Period Weeks    Status Achieved      PT LONG TERM GOAL #4   Title Patient will demonstrate right shoulder rotator cuff strength >/= 5/5 MMT to be able to lift objects into top cabinets and return to swimming    Baseline patient demonstrates right shoulder strength grossly 5/5 MMT    Time 6    Period Weeks    Status Achieved      PT LONG TERM GOAL #5   Title Patient will report demonstrate >/= 55 deg cervical rotation to improve driving ability    Baseline cervical rotation >/=60 deg    Time 6    Period Weeks    Status Achieved      PT LONG TERM GOAL #6   Title Patient will report </= 2/10 cervical pain and resoluton of numbness/tingling with activity to reduce functional limitations    Baseline patient currently with no neck pain    Time 6    Period Weeks    Status Achieved      PT LONG TERM GOAL #7   Title Patient will report low back/hip pain </= 2/10 in order to improve sitting and walking tolerance    Baseline patient reports 4/10 right lower back pain    Time 6    Period Weeks    Status On-going    Target Date 03/10/21      PT LONG TERM GOAL #8   Title Patient will exhibit hip strength  >/= 4/5 MMT to reduce pain and lumbar compensation with household activities    Baseline grossly 4-/5 MMT    Time 6    Period Weeks    Status On-going    Target Date 03/10/21                   Plan -  01/27/21 1406     Clinical Impression Statement Patient tolerated therapy well with no adverse effects. She continues to progress toward LTGs and demonstrates improved strength, but continues to report right sided lower back pain. Therapy focused on progressing core/hip strength with good tolerance. She has achieved all goals related to her right shoulder and neck, demonstrating improve motion and strength of each. Her only concern at this point is persistent right lower back pain but she is progressing with strength and states overall improvement. Updated HEP this visit to progress strength and lifting. She would benefit from continued skilled PT to progress mobility and strength in order to reduce low back pain, as well as continued treatment for neck and right shoulder to maximize functional ability.    PT Frequency 1x / week    PT Duration 6 weeks    PT Treatment/Interventions ADLs/Self Care Home Management;Aquatic Therapy;Cryotherapy;Electrical Stimulation;Iontophoresis '4mg'$ /ml Dexamethasone;Moist Heat;Neuromuscular re-education;Therapeutic exercise;Therapeutic activities;Functional mobility training;Patient/family education;Manual techniques;Dry needling;Passive range of motion;Taping;Joint Manipulations;Traction;Ultrasound;Balance training;Spinal Manipulations    PT Next Visit Plan Review HEP and progress PRN, continue muscle energy PRN, progress core and hip strengthening, manual for cervical/thoracic/lumbar mobility and muscular tension, DNF endurance training, neurodynamic PRN, progress rotator cuff and periscapular strength    PT Home Exercise Plan [email or web address]    Consulted and Agree with Plan of Care Patient             Patient will benefit from skilled therapeutic intervention  in order to improve the following deficits and impairments:  Decreased range of motion, Postural dysfunction, Decreased strength, Pain, Decreased activity tolerance, Impaired UE functional use, Impaired sensation, Increased muscle spasms, Impaired flexibility, Decreased endurance  Visit Diagnosis: Chronic bilateral low back pain, unspecified whether sciatica present  Cervicalgia  Muscle weakness (generalized)  Chronic right shoulder pain  Stiffness of right shoulder, not elsewhere classified     Problem List Patient Active Problem List   Diagnosis Date Noted   Genetic testing 10/02/2019   Environmental and seasonal allergies 09/19/2019   Family history of colon cancer    Family history of melanoma    Family history of ovarian cancer    Family history of breast cancer    Anxiety disorder 01/09/2019   Cervical stenosis of spine 10/21/2018   Insomnia 10/21/2018   Breast tenderness in female 02/05/2014   Pes planus of both feet 01/15/2014   Low back pain 11/23/2013   Phlebitis 10/06/2013   History of depression 09/26/2013   Breast cancer of upper-outer quadrant of right female breast ([place]) 08/18/2013   ADHD (attention deficit hyperactivity disorder) 12/01/2011   Headache 04/03/2009   Hypothyroidism 05/05/2007   GERD 05/05/2007   Osteoarthritis 05/05/2007    Hilda Blades, PT, DPT, LAT, ATC 01/27/21  2:43 PM Phone: [phone] Fax: [facility] [address] [place], [place], [address] Phone: [phone]   Fax:  [phone]  Name: BERTHE CASTRONOVO MRN: [id number] Date of Birth: [birth year]

## 2021-01-27 NOTE — Telephone Encounter (Signed)
Not able to leave a message on pt mobile number, voicemail is full, left a message on pt MyChart

## 2021-01-27 NOTE — Telephone Encounter (Signed)
Pt would like to know if she have a Pneumonia Vaccine and shingle shot

## 2021-01-27 NOTE — Patient Instructions (Signed)
Access Code: [email or web address] URL: [email or web address] Date: 01/27/2021 Prepared by: Hilda Blades  Exercises Shoulder External Rotation with Anchored Resistance - 1 x daily - 7 x weekly - 2 sets - 10 reps Shoulder Internal Rotation with Resistance - 1 x daily - 7 x weekly - 2 sets - 10 reps Standing Row with Anchored Resistance - 1 x daily - 7 x weekly - 2 sets - 12 reps Shoulder Extension with Resistance - 1 x daily - 7 x weekly - 2 sets - 12 reps Wall Push Up - 1 x daily - 7 x weekly - 2 sets - 12 reps Scaption with Dumbbells - 1 x daily - 7 x weekly - 2 sets - 8-10 reps Seated Shoulder External Rotation in Abduction Supported with Dumbbell - 1 x daily - 7 x weekly - 2 sets - 15 reps Sleeper Stretch - 2-3 x daily - 7 x weekly - 2 reps - 30 seconds hold Standing Shoulder Internal Rotation Stretch with Towel - 2-3 x daily - 7 x weekly - 5 reps - 10-15 seconds hold Supine Cervical Retraction with Towel - 2 x daily - 7 x weekly - 10 reps - 5 hold Doorway Pec Stretch at 90 Degrees Abduction - 2 x daily - 7 x weekly - 3 reps - 30 hold Shoulder External Rotation and Scapular Retraction with Resistance - 1 x daily - 7 x weekly - 10 reps - 3 hold - 2 sets Step Back Shoulder Stretch with Chair - 2 x daily - 7 x weekly - 5 reps - 5 hold - 2 sets Sidelying Thoracic Lumbar Rotation - 2 x daily - 7 x weekly - 2 sets - 5 reps - 5 hold Prone Hip Extension - Two Pillows - 1 x daily - 7 x weekly - 10 reps - 3 hold - 2 sets Prone Hip Extension on Table - 1 x daily - 7 x weekly - 10 reps - 3 hold - 2 sets Supine SI Joint Self-Correction - 2-3 x daily - 7 x weekly - 5-10 reps - 6 hold Hip Flexor Stretch at Edge of Bed - 1-2 x daily - 7 x weekly - 2 reps - 60 hold Bridge - 1 x daily - 7 x weekly - 2-3 sets - 5 reps - 5 hold Clam with Resistance - 1 x daily - 7 x weekly - 2-3 sets - 10 reps Bird Dog - 1 x daily - 7 x weekly - 2-3 sets - 10 reps - 3 hold Dead Bug with Swiss Ball - 1 x daily - 7 x  weekly - 2-3 sets - 20 reps Standing Anti-Rotation Press with Anchored Resistance - 1 x daily - 7 x weekly - 2-3 sets - 20 reps - 3 hold Half Deadlift with Kettlebell - 1 x daily - 7 x weekly - 3 sets - 10 reps

## 2021-01-27 NOTE — Telephone Encounter (Signed)
I do recommend she get both. She can get the pneumonia shots here but she should get the shingles shots at her pharmacy (it's cheaper there)

## 2021-02-03 ENCOUNTER — Ambulatory Visit: Payer: 59 | Admitting: Physical Therapy

## 2021-02-10 ENCOUNTER — Ambulatory Visit: Payer: 59 | Admitting: Physical Therapy

## 2021-02-10 ENCOUNTER — Other Ambulatory Visit: Payer: Self-pay

## 2021-02-10 ENCOUNTER — Encounter: Payer: Self-pay | Admitting: Physical Therapy

## 2021-02-10 DIAGNOSIS — M542 Cervicalgia: Secondary | ICD-10-CM

## 2021-02-10 DIAGNOSIS — G8929 Other chronic pain: Secondary | ICD-10-CM

## 2021-02-10 DIAGNOSIS — M545 Low back pain, unspecified: Secondary | ICD-10-CM | POA: Diagnosis not present

## 2021-02-10 DIAGNOSIS — M6281 Muscle weakness (generalized): Secondary | ICD-10-CM

## 2021-02-10 DIAGNOSIS — M25611 Stiffness of right shoulder, not elsewhere classified: Secondary | ICD-10-CM

## 2021-02-10 NOTE — Patient Instructions (Signed)
Access Code: [email or web address] URL: [email or web address] Date: 02/10/2021 Prepared by: Hilda Blades  Exercises Shoulder External Rotation with Anchored Resistance - 1 x daily - 7 x weekly - 2 sets - 10 reps Shoulder Internal Rotation with Resistance - 1 x daily - 7 x weekly - 2 sets - 10 reps Standing Row with Anchored Resistance - 1 x daily - 7 x weekly - 2 sets - 12 reps Shoulder Extension with Resistance - 1 x daily - 7 x weekly - 2 sets - 12 reps Wall Push Up - 1 x daily - 7 x weekly - 2 sets - 12 reps Scaption with Dumbbells - 1 x daily - 7 x weekly - 2 sets - 8-10 reps Seated Shoulder External Rotation in Abduction Supported with Dumbbell - 1 x daily - 7 x weekly - 2 sets - 15 reps Sleeper Stretch - 2-3 x daily - 7 x weekly - 2 reps - 30 seconds hold Standing Shoulder Internal Rotation Stretch with Towel - 2-3 x daily - 7 x weekly - 5 reps - 10-15 seconds hold Supine Cervical Retraction with Towel - 2 x daily - 7 x weekly - 10 reps - 5 hold Doorway Pec Stretch at 90 Degrees Abduction - 2 x daily - 7 x weekly - 3 reps - 30 hold Shoulder External Rotation and Scapular Retraction with Resistance - 1 x daily - 7 x weekly - 10 reps - 3 hold - 2 sets Step Back Shoulder Stretch with Chair - 2 x daily - 7 x weekly - 5 reps - 5 hold - 2 sets Sidelying Thoracic Lumbar Rotation - 2 x daily - 7 x weekly - 2 sets - 5 reps - 5 hold Prone Hip Extension - Two Pillows - 1 x daily - 7 x weekly - 10 reps - 3 hold - 2 sets Prone Hip Extension on Table - 1 x daily - 7 x weekly - 10 reps - 3 hold - 2 sets Supine SI Joint Self-Correction - 2-3 x daily - 7 x weekly - 5-10 reps - 6 hold Hip Flexor Stretch at Edge of Bed - 1-2 x daily - 7 x weekly - 2 reps - 60 hold Bridge - 1 x daily - 7 x weekly - 2-3 sets - 5 reps - 5 hold Clam with Resistance - 1 x daily - 7 x weekly - 2-3 sets - 10 reps Bird Dog - 1 x daily - 7 x weekly - 2-3 sets - 10 reps - 3 hold Dead Bug with Swiss Ball - 1 x daily - 7 x  weekly - 2-3 sets - 20 reps Standing Anti-Rotation Press with Anchored Resistance - 1 x daily - 7 x weekly - 2-3 sets - 20 reps - 3 hold Half Deadlift with Kettlebell - 1 x daily - 7 x weekly - 3 sets - 10 reps Plank on Knees - 1 x daily - 7 x weekly - 3 sets - 20 hold

## 2021-02-10 NOTE — Therapy (Signed)
[place], [place], [address] Phone: [phone]   Fax:  [phone]  Physical Therapy Treatment  Patient Details  Name: Kristina Pearson MRN: [id number] Date of Birth: May 29, 1956 Referring Provider (PT): Norva Karvonen, MD   Encounter Date: 02/10/2021   PT End of Session - 02/10/21 1412     Visit Number 33    Number of Visits 32    Date for PT Re-Evaluation 03/10/21    Authorization Type AETNA    PT Start Time [id number]    PT Stop Time [id number]    PT Time Calculation (min) 42 min    Activity Tolerance Patient tolerated treatment well    Behavior During Therapy [facility] for tasks assessed/performed             Past Medical History:  Diagnosis Date   ADHD    ADHD (attention deficit hyperactivity disorder)    ADD   Allergy    Anxiety    Arthritis    osteoarthritrs   Breast cancer ([place]) 08/15/13   right lateral upper outer   Depression    Family history of breast cancer    Family history of colon cancer    Family history of melanoma    Family history of ovarian cancer    Fibromyalgia    GERD (gastroesophageal reflux disease)    TUMS   Headache(784.0)    sees Dr. Gwendel Hanson at [facility]    History of depression    Hx of radiation therapy 10/19/13- 11/09/13   right breast 4256 cGy in 16 sessions, hypo-fractionated   Hypothyroid    Migraine    Osteopenia    Personal history of radiation therapy 2015   PONV (postoperative nausea and vomiting)    Sialoadenitis    Spinal stenosis    Urinary incontinence    Vitiligo    Wears glasses     Past Surgical History:  Procedure Laterality Date   BREAST BIOPSY Right 07/2013   BREAST LUMPECTOMY Right 08/2013   BREAST LUMPECTOMY WITH RADIOACTIVE SEED LOCALIZATION Right 08/30/2013   Procedure: RIGHT PARTIAL MASTECTOMY WITH RADIOACTIVE SEED LOCALIZATION;  Surgeon: [name], MD;  Location: [facility];  Service: General;   Laterality: Right;   [place]   lap   COLONOSCOPY  01/22/2020   per Dr. Cristina Gong, benign polyps, repeat in 7 yrs    [place]      There were no vitals filed for this visit.   Subjective Assessment - 02/10/21 1408     Subjective Patient reports she had to miss last visit due to a migraine. Currently she is feeling better, states her hip and lower back are bothering her but are tolerable. She notes that she has started taking the full dose of pain medication at night so she can sleep better. She reports that she is getting an SI injection at some point this month.    Patient Stated Goals Improve neck and lower back pain    Currently in Pain? Yes    Pain Score 5     Pain Location Back    Pain Orientation Right;Lower    Pain Descriptors / Indicators Aching;Sore    Pain Type Chronic pain    Pain Onset More than a month ago    Pain Frequency Constant  Aggravating Factors  Constant pain, activity, sitting extended periods                [facility] PT Assessment - 02/10/21 0001       Strength   Right Hip Extension 4-/5    Right Hip ABduction 4-/5    Left Hip Extension 4-/5    Left Hip ABduction 4-/5                           [facility] Adult PT Treatment/Exercise - 02/10/21 0001       Exercises   Exercises Lumbar      Lumbar Exercises: Stretches   Lower Trunk Rotation 5 reps;10 seconds    Other Lumbar Stretch Exercise Child's pose stretch 2 x 20 sec fwd and lat each      Lumbar Exercises: Supine   Clam 10 reps   2 sets   Clam Limitations unilateral with green    Single Leg Bridge 10 reps;3 seconds   2 sets   Other Supine Lumbar Exercises 90-90 alternating leg extensions 2 x 20      Lumbar Exercises: Sidelying   Hip Abduction 15 reps   2 sets   Other Sidelying Lumbar Exercises Modified side plank 2 x 20"      Lumbar Exercises: Prone   Other  Prone Lumbar Exercises Modified front plank 3 x 20"      Lumbar Exercises: Quadruped   Opposite Arm/Leg Raise 10 reps;5 seconds   2 sets                   PT Education - 02/10/21 1411     Education Details HEP update    Person(s) Educated Patient    Methods Explanation;Demonstration;Verbal cues;Handout    Comprehension Verbalized understanding;Returned demonstration;Verbal cues required;Need further instruction              PT Short Term Goals - 08/26/20 1404       PT SHORT TERM GOAL #1   Title Patient will be I with initial HEP to progress with PT    Time --    Period Weeks    Status Achieved    Target Date --      PT SHORT TERM GOAL #2   Title Patient will demonstrate PROM >/= 120 deg elevation to reduce shoulder stiffness and progress allowable range    Baseline 128    Time --    Period --    Status Achieved    Target Date --      PT SHORT TERM GOAL #3   Title Patient will report </= 5/10 pain with therapy to reduce limitation and allow progression within protocol    Time --    Period --    Status Achieved    Target Date --      PT SHORT TERM GOAL #4   Title PT will review FOTO with patient by 3rd visit    Status Achieved               PT Long Term Goals - 01/27/21 1414       PT LONG TERM GOAL #1   Title Patient will be I with final HEP to maintain progress from PT    Baseline continuing to progress HEP to include lumbar spine    Time 6    Period Weeks    Status On-going    Target Date 03/10/21  PT LONG TERM GOAL #2   Title Patient will report improved functional status >/= 55% on FOTO    Baseline FOTO for shoulder discharged - 01/27/2021    Time 6    Period Weeks    Status Deferred      PT LONG TERM GOAL #3   Title Patient will demonstrate right shoulder AROM grossly WFL to return to swimming    Baseline patient demonstrates right shoulder AROM grossly WFL    Time 6    Period Weeks    Status Achieved      PT LONG TERM  GOAL #4   Title Patient will demonstrate right shoulder rotator cuff strength >/= 5/5 MMT to be able to lift objects into top cabinets and return to swimming    Baseline patient demonstrates right shoulder strength grossly 5/5 MMT    Time 6    Period Weeks    Status Achieved      PT LONG TERM GOAL #5   Title Patient will report demonstrate >/= 55 deg cervical rotation to improve driving ability    Baseline cervical rotation >/=60 deg    Time 6    Period Weeks    Status Achieved      PT LONG TERM GOAL #6   Title Patient will report </= 2/10 cervical pain and resoluton of numbness/tingling with activity to reduce functional limitations    Baseline patient currently with no neck pain    Time 6    Period Weeks    Status Achieved      PT LONG TERM GOAL #7   Title Patient will report low back/hip pain </= 2/10 in order to improve sitting and walking tolerance    Baseline patient reports 4/10 right lower back pain    Time 6    Period Weeks    Status On-going    Target Date 03/10/21      PT LONG TERM GOAL #8   Title Patient will exhibit hip strength >/= 4/5 MMT to reduce pain and lumbar compensation with household activities    Baseline grossly 4-/5 MMT    Time 6    Period Weeks    Status On-going    Target Date 03/10/21                   Plan - 02/10/21 1412     Clinical Impression Statement Patient tolerated therapy well with no adverse effects. Therapy continued focus on core and hip strengthening with good tolerance. Patient is progressing well with her exercises and did not report any increase in pain this visit. She continues to report persistent right hip/lower back pain and she is scheduled to have an SIJ injection in the next few weeks. She reports her neck and shoulder remain non-painful and without any issues. She would benefit from continued skilled PT to progress mobility and strength in order to reduce low back pain, as well as continued treatment for neck and  right shoulder to maximize functional ability.    PT Treatment/Interventions ADLs/Self Care Home Management;Aquatic Therapy;Cryotherapy;Electrical Stimulation;Iontophoresis '4mg'$ /ml Dexamethasone;Moist Heat;Neuromuscular re-education;Therapeutic exercise;Therapeutic activities;Functional mobility training;Patient/family education;Manual techniques;Dry needling;Passive range of motion;Taping;Joint Manipulations;Traction;Ultrasound;Balance training;Spinal Manipulations    PT Next Visit Plan Review HEP and progress PRN, continue muscle energy PRN, progress core and hip strengthening, manual for cervical/thoracic/lumbar mobility and muscular tension, DNF endurance training, neurodynamic PRN, progress rotator cuff and periscapular strength    PT Home Exercise Plan [id number]    Consulted and Agree with Plan of  Care Patient             Patient will benefit from skilled therapeutic intervention in order to improve the following deficits and impairments:  Decreased range of motion, Postural dysfunction, Decreased strength, Pain, Decreased activity tolerance, Impaired UE functional use, Impaired sensation, Increased muscle spasms, Impaired flexibility, Decreased endurance  Visit Diagnosis: Chronic bilateral low back pain, unspecified whether sciatica present  Cervicalgia  Muscle weakness (generalized)  Chronic right shoulder pain  Stiffness of right shoulder, not elsewhere classified     Problem List Patient Active Problem List   Diagnosis Date Noted   Genetic testing 10/02/2019   Environmental and seasonal allergies 09/19/2019   Family history of colon cancer    Family history of melanoma    Family history of ovarian cancer    Family history of breast cancer    Anxiety disorder 01/09/2019   Cervical stenosis of spine 10/21/2018   Insomnia 10/21/2018   Breast tenderness in female 02/05/2014   Pes planus of both feet 01/15/2014   Low back pain 11/23/2013   Phlebitis 10/06/2013   History  of depression 09/26/2013   Breast cancer of upper-outer quadrant of right female breast ([place]) 08/18/2013   ADHD (attention deficit hyperactivity disorder) 12/01/2011   Headache 04/03/2009   Hypothyroidism 05/05/2007   GERD 05/05/2007   Osteoarthritis 05/05/2007    Hilda Blades, PT, DPT, LAT, ATC 02/10/21  2:46 PM Phone: [phone] Fax: [place] [facility] [address] [place], [place], [address] Phone: [phone]   Fax:  [phone]  Name: Kristina Pearson MRN: [id number] Date of Birth: 08/05/1955

## 2021-02-14 ENCOUNTER — Telehealth: Payer: Self-pay | Admitting: *Deleted

## 2021-02-14 NOTE — Telephone Encounter (Signed)
error 

## 2021-02-17 ENCOUNTER — Ambulatory Visit: Payer: 59 | Admitting: Physical Therapy

## 2021-02-17 ENCOUNTER — Other Ambulatory Visit: Payer: Self-pay | Admitting: Family Medicine

## 2021-02-17 ENCOUNTER — Other Ambulatory Visit: Payer: Self-pay

## 2021-02-17 ENCOUNTER — Encounter: Payer: Self-pay | Admitting: Physical Therapy

## 2021-02-17 DIAGNOSIS — M545 Low back pain, unspecified: Secondary | ICD-10-CM

## 2021-02-17 DIAGNOSIS — G8929 Other chronic pain: Secondary | ICD-10-CM

## 2021-02-17 DIAGNOSIS — M6281 Muscle weakness (generalized): Secondary | ICD-10-CM

## 2021-02-17 DIAGNOSIS — M25511 Pain in right shoulder: Secondary | ICD-10-CM

## 2021-02-17 DIAGNOSIS — M542 Cervicalgia: Secondary | ICD-10-CM

## 2021-02-17 DIAGNOSIS — M25611 Stiffness of right shoulder, not elsewhere classified: Secondary | ICD-10-CM

## 2021-02-17 NOTE — Therapy (Signed)
[place], [place], [address] Phone: [phone]   Fax:  [phone]  Physical Therapy Treatment  Patient Details  Name: Kristina Pearson MRN: [id number] Date of Birth: 01-22-1956 Referring Provider (PT): Norva Karvonen, MD   Encounter Date: 02/17/2021   PT End of Session - 02/17/21 1408     Visit Number 34    Number of Visits 38    Date for PT Re-Evaluation 03/10/21    Authorization Type AETNA    PT Start Time [id number]    PT Stop Time 1442    PT Time Calculation (min) 39 min    Activity Tolerance Patient tolerated treatment well    Behavior During Therapy [facility] for tasks assessed/performed             Past Medical History:  Diagnosis Date   ADHD    ADHD (attention deficit hyperactivity disorder)    ADD   Allergy    Anxiety    Arthritis    osteoarthritrs   Breast cancer ([place]) 08/15/13   right lateral upper outer   Depression    Family history of breast cancer    Family history of colon cancer    Family history of melanoma    Family history of ovarian cancer    Fibromyalgia    GERD (gastroesophageal reflux disease)    TUMS   Headache(784.0)    sees Dr. Gwendel Hanson at [facility]    History of depression    Hx of radiation therapy 10/19/13- 11/09/13   right breast 4256 cGy in 16 sessions, hypo-fractionated   Hypothyroid    Migraine    Osteopenia    Personal history of radiation therapy 2015   PONV (postoperative nausea and vomiting)    Sialoadenitis    Spinal stenosis    Urinary incontinence    Vitiligo    Wears glasses     Past Surgical History:  Procedure Laterality Date   BREAST BIOPSY Right 07/2013   BREAST LUMPECTOMY Right 08/2013   BREAST LUMPECTOMY WITH RADIOACTIVE SEED LOCALIZATION Right 08/30/2013   Procedure: RIGHT PARTIAL MASTECTOMY WITH RADIOACTIVE SEED LOCALIZATION;  Surgeon: [name], MD;  Location: [facility];  Service: General;   Laterality: Right;   [place]   lap   COLONOSCOPY  01/22/2020   per Dr. Cristina Gong, benign polyps, repeat in 7 yrs    [place]      There were no vitals filed for this visit.   Subjective Assessment - 02/17/21 1406     Subjective Patient reports she is doing well. She did have a hip MRI since last visit and has not gotten the results. She is supposed to get the lumbaror SIJ injection in the next few days.    Patient Stated Goals Improve neck and lower back pain    Currently in Pain? Yes    Pain Score 4     Pain Location Back    Pain Orientation Right;Lower    Pain Descriptors / Indicators Aching;Sore    Pain Type Chronic pain    Pain Onset More than a month ago    Pain Frequency Constant    Aggravating Factors  Constant pain, activity, sitting extended periods                [facility] PT Assessment -  02/17/21 0001       Strength   Right Hip Extension 4-/5    Right Hip ABduction 4/5    Left Hip Extension 4-/5    Left Hip ABduction 4/5                           [facility] Adult PT Treatment/Exercise - 02/17/21 0001       Exercises   Exercises Lumbar      Lumbar Exercises: Stretches   Lower Trunk Rotation Limitations x 10 each in figure-4 position      Lumbar Exercises: Aerobic   Nustep L5 x 5 min with UE/LE while taking subjective      Lumbar Exercises: Standing   Lifting 10 reps   3 sets   Lifting Weights (lbs) 25    Lifting Limitations deadlift from 8" box    Other Standing Lumbar Exercises Pallof press with red power band 3 x 15 each      Lumbar Exercises: Supine   Dead Bug 10 reps   2 sets   Dead Bug Limitations physioball    Single Leg Bridge 10 reps;3 seconds   2 sets, figure-4 position     Lumbar Exercises: Sidelying   Hip Abduction 15 reps   2 sets     Lumbar Exercises: Prone   Other Prone Lumbar Exercises Modified front  plank 3 x 30"      Lumbar Exercises: Quadruped   Other Quadruped Lumbar Exercises Hydrant with red 2 x 10    Other Quadruped Lumbar Exercises Donkey kick with red 2 x 10                    PT Education - 02/17/21 1407     Education Details HEP    Person(s) Educated Patient    Methods Explanation;Demonstration;Verbal cues    Comprehension Verbalized understanding;Returned demonstration;Verbal cues required;Need further instruction              PT Short Term Goals - 08/26/20 1404       PT SHORT TERM GOAL #1   Title Patient will be I with initial HEP to progress with PT    Time --    Period Weeks    Status Achieved    Target Date --      PT SHORT TERM GOAL #2   Title Patient will demonstrate PROM >/= 120 deg elevation to reduce shoulder stiffness and progress allowable range    Baseline 128    Time --    Period --    Status Achieved    Target Date --      PT SHORT TERM GOAL #3   Title Patient will report </= 5/10 pain with therapy to reduce limitation and allow progression within protocol    Time --    Period --    Status Achieved    Target Date --      PT SHORT TERM GOAL #4   Title PT will review FOTO with patient by 3rd visit    Status Achieved               PT Long Term Goals - 01/27/21 1414       PT LONG TERM GOAL #1   Title Patient will be I with final HEP to maintain progress from PT    Baseline continuing to progress HEP to include lumbar spine    Time 6    Period Weeks  Status On-going    Target Date 03/10/21      PT LONG TERM GOAL #2   Title Patient will report improved functional status >/= 55% on FOTO    Baseline FOTO for shoulder discharged - 01/27/2021    Time 6    Period Weeks    Status Deferred      PT LONG TERM GOAL #3   Title Patient will demonstrate right shoulder AROM grossly WFL to return to swimming    Baseline patient demonstrates right shoulder AROM grossly WFL    Time 6    Period Weeks    Status Achieved       PT LONG TERM GOAL #4   Title Patient will demonstrate right shoulder rotator cuff strength >/= 5/5 MMT to be able to lift objects into top cabinets and return to swimming    Baseline patient demonstrates right shoulder strength grossly 5/5 MMT    Time 6    Period Weeks    Status Achieved      PT LONG TERM GOAL #5   Title Patient will report demonstrate >/= 55 deg cervical rotation to improve driving ability    Baseline cervical rotation >/=60 deg    Time 6    Period Weeks    Status Achieved      PT LONG TERM GOAL #6   Title Patient will report </= 2/10 cervical pain and resoluton of numbness/tingling with activity to reduce functional limitations    Baseline patient currently with no neck pain    Time 6    Period Weeks    Status Achieved      PT LONG TERM GOAL #7   Title Patient will report low back/hip pain </= 2/10 in order to improve sitting and walking tolerance    Baseline patient reports 4/10 right lower back pain    Time 6    Period Weeks    Status On-going    Target Date 03/10/21      PT LONG TERM GOAL #8   Title Patient will exhibit hip strength >/= 4/5 MMT to reduce pain and lumbar compensation with household activities    Baseline grossly 4-/5 MMT    Time 6    Period Weeks    Status On-going    Target Date 03/10/21                   Plan - 02/17/21 1408     Clinical Impression Statement Patient tolerated therapy well with no adverse effects. Therapy focused on continued progression of core and hip strength with good tolerance. Patient did report right shoulder soreness this visit so held off on performing side planks, but she was able to quadruped strengthening. Patient seems to be continuing to improve with her strength but notes continued right sided low back and hip discomfort. No increase in pain noted this visit. She is scheduled to have lumbar injection in upcoming week. She would benefit from continued skilled PT to progress mobility and  strength in order to reduce low back pain, as well as continued treatment for neck and right shoulder to maximize functional ability.    PT Treatment/Interventions ADLs/Self Care Home Management;Aquatic Therapy;Cryotherapy;Electrical Stimulation;Iontophoresis '4mg'$ /ml Dexamethasone;Moist Heat;Neuromuscular re-education;Therapeutic exercise;Therapeutic activities;Functional mobility training;Patient/family education;Manual techniques;Dry needling;Passive range of motion;Taping;Joint Manipulations;Traction;Ultrasound;Balance training;Spinal Manipulations    PT Next Visit Plan Review HEP and progress PRN, continue muscle energy PRN, progress core and hip strengthening, manual for cervical/thoracic/lumbar mobility and muscular tension, DNF endurance training, neurodynamic PRN, progress  rotator cuff and periscapular strength    PT Home Exercise Plan [id number]    Consulted and Agree with Plan of Care Patient             Patient will benefit from skilled therapeutic intervention in order to improve the following deficits and impairments:  Decreased range of motion, Postural dysfunction, Decreased strength, Pain, Decreased activity tolerance, Impaired UE functional use, Impaired sensation, Increased muscle spasms, Impaired flexibility, Decreased endurance  Visit Diagnosis: Chronic bilateral low back pain, unspecified whether sciatica present  Cervicalgia  Muscle weakness (generalized)  Chronic right shoulder pain  Stiffness of right shoulder, not elsewhere classified     Problem List Patient Active Problem List   Diagnosis Date Noted   Genetic testing 10/02/2019   Environmental and seasonal allergies 09/19/2019   Family history of colon cancer    Family history of melanoma    Family history of ovarian cancer    Family history of breast cancer    Anxiety disorder 01/09/2019   Cervical stenosis of spine 10/21/2018   Insomnia 10/21/2018   Breast tenderness in female 02/05/2014   Pes planus  of both feet 01/15/2014   Low back pain 11/23/2013   Phlebitis 10/06/2013   History of depression 09/26/2013   Breast cancer of upper-outer quadrant of right female breast ([place]) 08/18/2013   ADHD (attention deficit hyperactivity disorder) 12/01/2011   Headache 04/03/2009   Hypothyroidism 05/05/2007   GERD 05/05/2007   Osteoarthritis 05/05/2007    Kristina Pearson, PT, DPT, LAT, ATC 02/17/21  2:48 PM Phone: [phone] Fax: [facility] [address] [place], [place], [address] Phone: [phone]   Fax:  [phone]  Name: Kristina Pearson MRN: [id number] Date of Birth: 1956-02-19

## 2021-02-17 NOTE — Telephone Encounter (Signed)
Last refill- 08/19/20--30 capsules, 5 refills. Last office- 12/18/20  No future visit is schedule

## 2021-03-10 ENCOUNTER — Encounter: Payer: Self-pay | Admitting: Physical Therapy

## 2021-03-11 ENCOUNTER — Telehealth: Payer: Self-pay | Admitting: *Deleted

## 2021-03-11 NOTE — Telephone Encounter (Signed)
Error message

## 2021-03-14 ENCOUNTER — Other Ambulatory Visit: Payer: Self-pay | Admitting: Family Medicine

## 2021-03-17 ENCOUNTER — Ambulatory Visit: Payer: 59 | Admitting: Physical Therapy

## 2021-03-17 NOTE — Telephone Encounter (Signed)
Last office visit- 12/18/2020 Last refills 09/03/20--60 tabs with 5 refills  No future office visit scheduled.  Can this patient receive a refill?

## 2021-03-18 ENCOUNTER — Telehealth: Payer: Self-pay | Admitting: Family Medicine

## 2021-03-18 ENCOUNTER — Telehealth: Payer: Self-pay

## 2021-03-18 NOTE — Telephone Encounter (Signed)
Patient called requesting Rx refill  ALPRAZolam (XANAX) 0.5 MG tablet

## 2021-03-18 NOTE — Telephone Encounter (Signed)
Pt call again and stated she want  a call back before today is over.

## 2021-03-18 NOTE — Telephone Encounter (Signed)
See previous message

## 2021-03-18 NOTE — Telephone Encounter (Signed)
Last refill- 09/03/20--60 tabs with 5 refills Last office visit- 12/18/20  No future office visit scheduled. Can this patient receive a refill?

## 2021-03-18 NOTE — Telephone Encounter (Signed)
Spoke with patient.  Last prescription that was on file for Alprazolam 0.5mg  has expired.     Please send refill to [facility] on [address].

## 2021-03-18 NOTE — Telephone Encounter (Signed)
Pt LOV was on 12/18/2020 Last refill done on 09/03/2020 Please advise

## 2021-03-19 NOTE — Telephone Encounter (Signed)
I just did this.

## 2021-03-19 NOTE — Telephone Encounter (Signed)
Noted, [facility] will notify patient when refill is ready for pickup.

## 2021-03-20 ENCOUNTER — Other Ambulatory Visit: Payer: Self-pay | Admitting: Family Medicine

## 2021-03-21 NOTE — Telephone Encounter (Signed)
Last refill- 10/21/2018--90 tabs, 5 refills Last OV- 11/21/20  No future office visit scheduled.  Can this patient receive a refill on this medication?

## 2021-03-31 ENCOUNTER — Ambulatory Visit: Payer: 59 | Admitting: Physical Therapy

## 2021-04-01 ENCOUNTER — Other Ambulatory Visit: Payer: Self-pay

## 2021-04-01 NOTE — Telephone Encounter (Signed)
Patient called requesting Rx refill sent to  [facility]  [address] [place], [place] [address]  lisdexamfetamine (VYVANSE) 40 MG

## 2021-04-01 NOTE — Addendum Note (Signed)
Addended by: Otilio Miu on: 04/01/2021 12:44 PM   Modules accepted: Orders

## 2021-04-01 NOTE — Telephone Encounter (Signed)
Last refill- 02/05/21 Last office visit- 12/18/20  No future office visit scheduled

## 2021-04-02 MED ORDER — LISDEXAMFETAMINE DIMESYLATE 40 MG PO CAPS: 40.0000 mg | ORAL_CAPSULE | ORAL | 0 refills | Status: DC

## 2021-04-02 MED ORDER — LISDEXAMFETAMINE DIMESYLATE 40 MG PO CAPS
40.0000 mg | ORAL_CAPSULE | ORAL | 0 refills | Status: DC
Start: 2021-06-02 — End: 2021-05-22

## 2021-04-02 MED ORDER — LISDEXAMFETAMINE DIMESYLATE 40 MG PO CAPS
40.0000 mg | ORAL_CAPSULE | ORAL | 0 refills | Status: DC
Start: 2021-05-03 — End: 2021-04-02

## 2021-04-02 NOTE — Addendum Note (Signed)
Addended by: [name] A on: 04/02/2021 07:41 AM   Modules accepted: Orders

## 2021-04-02 NOTE — Telephone Encounter (Signed)
Done

## 2021-04-07 ENCOUNTER — Encounter: Payer: 59 | Admitting: Physical Therapy

## 2021-04-14 ENCOUNTER — Encounter: Payer: 59 | Admitting: Physical Therapy

## 2021-04-15 ENCOUNTER — Telehealth: Payer: 59 | Admitting: Family Medicine

## 2021-04-16 ENCOUNTER — Other Ambulatory Visit: Payer: Self-pay | Admitting: Family Medicine

## 2021-04-20 ENCOUNTER — Other Ambulatory Visit: Payer: Self-pay | Admitting: Family Medicine

## 2021-04-21 ENCOUNTER — Telehealth: Payer: Self-pay

## 2021-04-21 NOTE — Telephone Encounter (Signed)
Caller states she is having vertigo. She said it is more severe in the morning. Previously has a little ear pain but not currently. -Caller states she is having vertigo. Ear pain 1 wk ago but none now. 04/21/2021 12:17:34 PM Go to ED Now (or PCP triage) Yes Carmon, RN, [place] Understands Yes  Pt called at 1326; states she was told to go to the ED IF she could not get an appt in the office. Pt states she was then transferred back to our office & appt made with Dr Elease Hashimoto for tomorrow. Pt admits that she was hesitant to go to ED as this appears to be a chronic problem (x1wk); states she was able to get Meclizine prescription filled & took that - too soon to determine if it's working. Pt advised to go to ED if symptoms worsen; pt verb understanding.

## 2021-04-22 ENCOUNTER — Ambulatory Visit (INDEPENDENT_AMBULATORY_CARE_PROVIDER_SITE_OTHER): Payer: 59 | Admitting: Family Medicine

## 2021-04-22 ENCOUNTER — Other Ambulatory Visit: Payer: Self-pay

## 2021-04-22 ENCOUNTER — Ambulatory Visit: Payer: 59 | Admitting: Physical Therapy

## 2021-04-22 VITALS — BP 120/80 | HR 120 | Temp 98.0°F | Wt 165.1 lb

## 2021-04-22 DIAGNOSIS — R42 Dizziness and giddiness: Secondary | ICD-10-CM

## 2021-04-22 NOTE — Progress Notes (Signed)
Established Patient Office Visit  Subjective:  Patient ID: Kristina Pearson, female    DOB: 1956-05-27  Age: 65 y.o. MRN: [id number]  CC:  Chief Complaint  Patient presents with   Dizziness    X 1 week, constant vertigo, feels like she is walking lop sided to the rigth    HPI Kristina Pearson presents for 1 week history of vertigo symptoms.  Symptoms seem to be worse when turning to the left side.  Symptoms are actually somewhat improved today but still having some vertigo symptoms with movement and ambulation.  No syncopal or presyncopal symptoms.  She is had some meclizine which helped slightly.  Mild nausea on 1 occasion but no vomiting.  No persistent headache symptoms.  No slurred speech.  No focal weakness.  No ataxia.  She had similar episode of vertigo about 26 years ago but none since.  No recent sudden hearing changes.  No tinnitus.  She does think that her symptoms were worse with turning to the left side especially earlier in the week  Past Medical History:  Diagnosis Date   ADHD    ADHD (attention deficit hyperactivity disorder)    ADD   Allergy    Anxiety    Arthritis    osteoarthritrs   Breast cancer ([place]) 08/15/13   right lateral upper outer   Depression    Family history of breast cancer    Family history of colon cancer    Family history of melanoma    Family history of ovarian cancer    Fibromyalgia    GERD (gastroesophageal reflux disease)    TUMS   Headache(784.0)    sees Dr. Gwendel Hanson at [facility]    History of depression    Hx of radiation therapy 10/19/13- 11/09/13   right breast 4256 cGy in 16 sessions, hypo-fractionated   Hypothyroid    Migraine    Osteopenia    Personal history of radiation therapy 2015   PONV (postoperative nausea and vomiting)    Sialoadenitis    Spinal stenosis    Urinary incontinence    Vitiligo    Wears glasses     Past Surgical History:  Procedure Laterality Date   BREAST BIOPSY Right 07/2013   BREAST  LUMPECTOMY Right 08/2013   BREAST LUMPECTOMY WITH RADIOACTIVE SEED LOCALIZATION Right 08/30/2013   Procedure: RIGHT PARTIAL MASTECTOMY WITH RADIOACTIVE SEED LOCALIZATION;  Surgeon: [name], MD;  Location: [facility];  Service: General;  Laterality: Right;   [place]   lap   COLONOSCOPY  01/22/2020   per Dr. Cristina Gong, benign polyps, repeat in 7 yrs    DIAGNOSTIC LAPAROSCOPY  1982   exp   [place]      Family History  Problem Relation Age of Onset   Arthritis Mother    Diabetes Mother    Osteoporosis Mother    Polymyalgia rheumatica Mother        on prednisone   Heart disease Mother    Hyperlipidemia Mother    Hypertension Mother    Stroke Mother    Heart attack Father    Heart disease Father    Melanoma Father    Colon cancer Maternal Uncle        dx in his 88s   Colon cancer Cousin 36       mother's maternal first cousin   Breast cancer Cousin  49       BRCA negative; father's maternal first cousin   Leukemia Maternal Uncle 81       AML   Melanoma Maternal Uncle 73   Leukemia Maternal Uncle 76   Ovarian cancer Cousin 26       maternal cousin   Ovarian cancer Cousin 24       maternal cousin   Leukemia Cousin        dx in his 40s; maternal cousin; thought to be the result of taking Humera   Heart attack Paternal Uncle    Melanoma Paternal Uncle    Colon cancer Cousin 49       maternal second cousin   Breast cancer Other        maternal great aunt dx in her 60s   Ovarian cancer Other        maternal grandfather's sister dx in her 50s   Colon cancer Other        maternal grandfather's sister dx in her 80s   Colon cancer Other        maternal grandfather's sister dx in her 80s   Other Daughter        dermoid tumor   Diabetes Paternal Grandmother    Skin cancer Paternal Grandmother    Hypertension Paternal Grandmother    Melanoma Paternal Grandmother    Congestive  Heart Failure Maternal Grandmother    Diabetes Maternal Grandmother    Other Maternal Aunt        COVID    Social History   Socioeconomic History   Marital status: Married    Spouse name: Not on file   Number of children: 1   Years of education: Not on file   Highest education level: Not on file  Occupational History   Not on file  Tobacco Use   Smoking status: Former    Types: Cigarettes    Quit date: 09/14/1978    Years since quitting: 42.6   Smokeless tobacco: Never   Tobacco comments:    short time in college  Vaping Use   Vaping Use: Never used  Substance and Sexual Activity   Alcohol use: Yes    Alcohol/week: 1.0 - 2.0 standard drink    Types: 1 - 2 Standard drinks or equivalent per week    Comment: social   Drug use: No   Sexual activity: Yes    Partners: Male    Birth control/protection: Post-menopausal    Comment: menarche age 11, first live birth age 37, menopause age 50, no HRT  Other Topics Concern   Not on file  Social History Narrative   Lives with husband, daughter and mother in a 2 story home.  Has one child.  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  Education: college degree   Social Determinants of Health   Financial Resource Strain: Not on file  Food Insecurity: Not on file  Transportation Needs: Not on file  Physical Activity: Not on file  Stress: Not on file  Social Connections: Not on file  Intimate Partner Violence: Not on file    Outpatient Medications Prior to Visit  Medication Sig Dispense Refill   albuterol (VENTOLIN HFA) 108 (90 Base) MCG/ACT inhaler Inhale 2 puffs into the lungs every 4 (four) hours as needed.     ALPRAZolam (XANAX) 0.5 MG tablet TAKE 1 TABLET BY MOUTH 2 TIMES DAILY AS NEEDED FOR ANXIETY. 60 tablet 5   aspirin EC 81 MG tablet   Take 1 tablet (81 mg total) by mouth daily. 1 tablet 0   Boswellia Serrata (BOSWELLIA PO) Take by mouth.     celecoxib (CELEBREX) 200 MG capsule Take 1 capsule (200 mg total) by  mouth daily. 90 capsule 3   cyclobenzaprine (FLEXERIL) 10 MG tablet TAKE 1 TABLET BY MOUTH THREE TIMES A DAY AS NEEDED FOR MUSCLE SPASMS 90 tablet 0   cycloSPORINE (RESTASIS) 0.05 % ophthalmic emulsion Place 1 drop into both eyes 2 (two) times daily.     estradiol (ESTRACE VAGINAL) 0.1 MG/GM vaginal cream Place 1 Applicatorful vaginally 3 (three) times a week. 42.5 g 12   gabapentin (NEURONTIN) 100 MG capsule TAKE 1 CAPSULE BY MOUTH 3 TIMES DAILY AS NEEDED (HEADACHES). 90 capsule 5   gabapentin (NEURONTIN) 300 MG capsule TAKE 1 CAPSULE BY MOUTH EVERYDAY AT BEDTIME 30 capsule 3   levothyroxine (SYNTHROID, LEVOTHROID) 112 MCG tablet Take 112 mcg by mouth daily before breakfast.     [START ON 06/02/2021] lisdexamfetamine (VYVANSE) 40 MG capsule Take 1 capsule (40 mg total) by mouth every morning. 30 capsule 0   Magnesium 400 MG CAPS Take by mouth daily.     meclizine (ANTIVERT) 25 MG tablet Take 1 tablet (25 mg total) by mouth every 4 (four) hours as needed for dizziness or nausea. 60 tablet 3   Multiple Vitamin (MULTIVITAMIN) tablet Take 1 tablet by mouth daily. One a day for over age 98     mupirocin cream (BACTROBAN) 2 % Apply 1 application topically 2 (two) times daily. 30 g 0   oxyCODONE (OXY IR/ROXICODONE) 5 MG immediate release tablet Take 5 mg by mouth 4 (four) times daily as needed.     temazepam (RESTORIL) 30 MG capsule TAKE 1 CAPSULE (30 MG TOTAL) BY MOUTH EVERY DAY AT BEDTIME AS NEEDED FOR SLEEP. 30 capsule 5   traMADol (ULTRAM) 50 MG tablet TAKE 2 TABLETS (100 MG TOTAL) BY MOUTH EVERY 6 (SIX) HOURS AS NEEDED FOR MODERATE PAIN. 120 tablet 5   UNABLE TO FIND CBD oil 20 mg - half a dropper at bedtime for pain or scalp psoriases     No facility-administered medications prior to visit.    No Known Allergies  ROS Review of Systems  Constitutional:  Negative for appetite change, chills and fever.  HENT:  Negative for trouble swallowing.   Eyes:  Negative for visual disturbance.   Respiratory:  Negative for cough and shortness of breath.   Cardiovascular:  Negative for chest pain.  Neurological:  Positive for dizziness. Negative for seizures, syncope, speech difficulty and weakness.     Objective:    Physical Exam Vitals reviewed.  Cardiovascular:     Pulses: Normal pulses.     Heart sounds: Normal heart sounds.  Pulmonary:     Effort: Pulmonary effort is normal.     Breath sounds: Normal breath sounds.  Musculoskeletal:     Cervical back: Neck supple.  Neurological:     General: No focal deficit present.     Mental Status: She is alert and oriented to person, place, and time.     Cranial Nerves: No cranial nerve deficit.     Motor: No weakness.     Coordination: Coordination normal.     Gait: Gait normal.     Comments: She does have reproducible vertigo symptoms when turning head to the left 45 degrees and lying supine.  These were not reproduced with turning head to the right    BP 120/80 (BP Location:  Left Arm, Patient Position: Sitting, Cuff Size: Normal)   Pulse (!) 120   Temp 98 F (36.7 C) (Oral)   Wt 165 lb 1.6 oz (74.9 kg)   LMP 09/21/2006   SpO2 99%   BMI 26.65 kg/m  Wt Readings from Last 3 Encounters:  04/22/21 165 lb 1.6 oz (74.9 kg)  12/18/20 169 lb (76.7 kg)  12/06/20 168 lb 2 oz (76.3 kg)     Health Maintenance Due  Topic Date Due   Pneumonia Vaccine 6+ Years old (1 - PCV) Never done   HIV Screening  Never done   Zoster Vaccines- Shingrix (1 of 2) Never done   COVID-19 Vaccine (5 - Booster for Pfizer series) 12/05/2020    There are no preventive care reminders to display for this patient.  Lab Results  Component Value Date   TSH 0.673 09/18/2019   Lab Results  Component Value Date   WBC 4.4 10/17/2020   HGB 12.9 10/17/2020   HCT 38.3 10/17/2020   MCV 95.3 10/17/2020   PLT 228.0 10/17/2020   Lab Results  Component Value Date   NA 140 10/17/2020   K 4.9 10/17/2020   CHLORIDE 105 02/21/2014   CO2 31  10/17/2020   GLUCOSE 88 10/17/2020   BUN 17 10/17/2020   CREATININE 1.04 10/17/2020   BILITOT 0.5 10/17/2020   ALKPHOS 54 10/17/2020   AST 18 10/17/2020   ALT 13 10/17/2020   PROT 6.8 10/17/2020   ALBUMIN 4.3 10/17/2020   CALCIUM 9.9 10/17/2020   ANIONGAP 8 09/30/2014   GFR 56.59 (L) 10/17/2020   Lab Results  Component Value Date   CHOL 198 10/17/2020   Lab Results  Component Value Date   HDL 86.60 10/17/2020   Lab Results  Component Value Date   LDLCALC 91 10/17/2020   Lab Results  Component Value Date   TRIG 102.0 10/17/2020   Lab Results  Component Value Date   CHOLHDL 2 10/17/2020   Lab Results  Component Value Date   HGBA1C 5.1 10/17/2020      Assessment & Plan:   Probable benign peripheral positional vertigo.  Symptoms reproduced in office with turning head to the left but not the right.  She has some mild horizontal nystagmus at this time.  No vertical nystagmus.  No ataxia.  -Recommend trial of Epley maneuvers with handout given -Continue meclizine as needed -If symptoms not improving next couple days consider referral for vestibular rehab. -Follow-up immediately for any new neurologic symptoms such as slurred speech, focal weakness, etc. -  No orders of the defined types were placed in this encounter.   Follow-up: No follow-ups on file.    Carolann Littler, MD

## 2021-04-25 ENCOUNTER — Other Ambulatory Visit: Payer: Self-pay | Admitting: Adult Health

## 2021-04-25 DIAGNOSIS — Z1231 Encounter for screening mammogram for malignant neoplasm of breast: Secondary | ICD-10-CM

## 2021-04-28 ENCOUNTER — Other Ambulatory Visit: Payer: Self-pay

## 2021-04-28 ENCOUNTER — Encounter: Payer: Self-pay | Admitting: Physical Therapy

## 2021-04-28 ENCOUNTER — Ambulatory Visit: Payer: 59 | Attending: Physician Assistant | Admitting: Physical Therapy

## 2021-04-28 DIAGNOSIS — M6281 Muscle weakness (generalized): Secondary | ICD-10-CM | POA: Diagnosis present

## 2021-04-28 DIAGNOSIS — M545 Low back pain, unspecified: Secondary | ICD-10-CM | POA: Diagnosis present

## 2021-04-28 DIAGNOSIS — M542 Cervicalgia: Secondary | ICD-10-CM | POA: Diagnosis present

## 2021-04-28 DIAGNOSIS — M25611 Stiffness of right shoulder, not elsewhere classified: Secondary | ICD-10-CM | POA: Diagnosis present

## 2021-04-28 DIAGNOSIS — M25511 Pain in right shoulder: Secondary | ICD-10-CM | POA: Diagnosis present

## 2021-04-28 DIAGNOSIS — G8929 Other chronic pain: Secondary | ICD-10-CM | POA: Diagnosis present

## 2021-04-28 NOTE — Patient Instructions (Signed)
Access Code: [email or web address] URL: [email or web address] Date: 04/28/2021 Prepared by: Hilda Blades  Exercises Supine Cervical Retraction with Towel - 1-2 x daily - 7 x weekly - 10 reps - 5 hold Supine Lower Trunk Rotation - 1-2 x daily - 7 x weekly - 5 reps - 10 hold Supine Piriformis Stretch with Foot on Ground - 1-2 x daily - 7 x weekly - 3 reps - 30 hold Supine Posterior Pelvic Tilt - 1-2 x daily - 7 x weekly - 10 reps - 5 hold Sidelying Shoulder External Rotation - 1-2 x daily - 7 x weekly - 2 sets - 15 reps Clamshell - 1-2 x daily - 7 x weekly - 2 sets - 15 reps Sidelying Thoracic Lumbar Rotation - 1-2 x daily - 7 x weekly - 5 reps - 10 hold Gentle Upper Trap Stretch - 1-2 x daily - 7 x weekly - 3 reps - 30 hold

## 2021-04-28 NOTE — Therapy (Signed)
[place], [place], [address] Phone: [phone]   Fax:  [phone]  Physical Therapy Treatment / ERO  Patient Details  Name: Kristina Pearson MRN: [id number] Date of Birth: 12/19/1955 Referring Provider (PT): Norva Karvonen, MD   Encounter Date: 04/28/2021   PT End of Session - 04/28/21 1415     Visit Number 35    Number of Visits 43    Date for PT Re-Evaluation 06/23/21    Authorization Type AETNA    PT Start Time 1400    PT Stop Time 2683    PT Time Calculation (min) 45 min    Activity Tolerance Patient tolerated treatment well    Behavior During Therapy [facility] for tasks assessed/performed             Past Medical History:  Diagnosis Date   ADHD    ADHD (attention deficit hyperactivity disorder)    ADD   Allergy    Anxiety    Arthritis    osteoarthritrs   Breast cancer ([place]) 08/15/13   right lateral upper outer   Depression    Family history of breast cancer    Family history of colon cancer    Family history of melanoma    Family history of ovarian cancer    Fibromyalgia    GERD (gastroesophageal reflux disease)    TUMS   Headache(784.0)    sees Dr. Gwendel Hanson at [facility]    History of depression    Hx of radiation therapy 10/19/13- 11/09/13   right breast 4256 cGy in 16 sessions, hypo-fractionated   Hypothyroid    Migraine    Osteopenia    Personal history of radiation therapy 2015   PONV (postoperative nausea and vomiting)    Sialoadenitis    Spinal stenosis    Urinary incontinence    Vitiligo    Wears glasses     Past Surgical History:  Procedure Laterality Date   BREAST BIOPSY Right 07/2013   BREAST LUMPECTOMY Right 08/2013   BREAST LUMPECTOMY WITH RADIOACTIVE SEED LOCALIZATION Right 08/30/2013   Procedure: RIGHT PARTIAL MASTECTOMY WITH RADIOACTIVE SEED LOCALIZATION;  Surgeon: [name], MD;  Location: [facility];  Service: General;   Laterality: Right;   [place]   lap   COLONOSCOPY  01/22/2020   per Dr. Cristina Gong, benign polyps, repeat in 7 yrs    [place]      There were no vitals filed for this visit.   Subjective Assessment - 04/28/21 1404     Subjective Patient reports she has been dealing with vertigo for 3 weeks, currently it comes and goes. She has not been able to get in her exercises due to the vertigo and has not been able to go on her walks. Patient states that her neck and shoulders have been bothering her more. She states within this past weekend her neck pain has started to return since she had the initial injection. She did have an injection in her low back and right hip which helped her pain significantly.    Patient Stated Goals Improve neck and lower back pain    Currently in Pain? Yes    Pain Score 6     Pain Location Neck    Pain Orientation Left;Right;Lower    Pain Descriptors / Indicators  Tightness;Aching    Pain Type Chronic pain    Pain Radiating Towards bilateral upper trap region    Pain Onset More than a month ago    Pain Frequency Constant    Aggravating Factors  Activity, sitting extended periods in poor posture    Multiple Pain Sites Yes    Pain Score 3    Pain Location Back    Pain Orientation Right;Lower    Pain Descriptors / Indicators Tightness;Sore;Aching    Pain Type Chronic pain    Pain Radiating Towards right hip region    Pain Onset More than a month ago    Pain Frequency Intermittent    Aggravating Factors  Sitting extended periods in poor posture    Pain Score 5    Pain Location Shoulder    Pain Orientation Right    Pain Descriptors / Indicators Aching;Sore    Pain Type Chronic pain    Pain Onset More than a month ago    Pain Frequency Intermittent    Aggravating Factors  Lifting, increase in activity level.    Pain Relieving Factors Medication                 [facility] PT Assessment - 04/28/21 0001       Assessment   Medical Diagnosis Right rotator cuff repair, Neck pain, Low back pain    Referring Provider (PT) Norva Karvonen, MD    Onset Date/Surgical Date 07/03/20      Precautions   Precautions None      Restrictions   Weight Bearing Restrictions No      Balance Screen   Has the patient fallen in the past 6 months No      Prior Function   Level of Independence Independent      Cognition   Overall Cognitive Status Within Functional Limits for tasks assessed      Observation/Other Assessments   Observations Patient appears in no apparent distress    Focus on Therapeutic Outcomes (FOTO)  Discharged from FOTO      Sensation   Light Touch Appears Intact      Coordination   Gross Motor Movements are Fluid and Coordinated Yes      AROM   Overall AROM Comments Right shoulder motion grossly WFL and equal to opposite side      Strength   Overall Strength Comments Core and periscapular strength grossly 4-/5 MMT    Right Hip Extension 4-/5    Right Hip ABduction 4/5    Left Hip Extension 4-/5    Left Hip ABduction 4/5      Palpation   Palpation comment TTP bilat upper trap regions, right lumbar paraspinals and gluteal region                                    PT Education - 04/28/21 1414     Education Details POC update, exam findings and progress toward goals, HEP update, obtaining vestibular PT referral    Person(s) Educated Patient    Methods Explanation;Demonstration;Verbal cues;Handout    Comprehension Verbalized understanding;Returned demonstration;Verbal cues required;Need further instruction              PT Short Term Goals - 08/26/20 1404       PT SHORT TERM GOAL #1   Title Patient will be I with initial HEP to progress with PT    Time --  Period Weeks    Status Achieved    Target Date --      PT SHORT TERM GOAL #2   Title Patient will demonstrate PROM  >/= 120 deg elevation to reduce shoulder stiffness and progress allowable range    Baseline 128    Time --    Period --    Status Achieved    Target Date --      PT SHORT TERM GOAL #3   Title Patient will report </= 5/10 pain with therapy to reduce limitation and allow progression within protocol    Time --    Period --    Status Achieved    Target Date --      PT SHORT TERM GOAL #4   Title PT will review FOTO with patient by 3rd visit    Status Achieved               PT Long Term Goals - 04/28/21 1529       PT LONG TERM GOAL #1   Title Patient will be I with final HEP to maintain progress from PT    Baseline updated HEP this visit    Time 8    Period Weeks    Status On-going    Target Date 06/23/21      PT LONG TERM GOAL #2   Title Patient will report improved functional status >/= 55% on FOTO    Baseline FOTO for shoulder discharged - 01/27/2021    Time 6    Period Weeks    Status Deferred      PT LONG TERM GOAL #3   Title Patient will demonstrate right shoulder AROM grossly WFL to return to swimming    Baseline patient demonstrates right shoulder AROM grossly WFL    Time 6    Period Weeks    Status Achieved      PT LONG TERM GOAL #4   Title Patient will demonstrate right shoulder rotator cuff strength >/= 5/5 MMT to be able to lift objects into top cabinets and return to swimming    Baseline patient demonstrates right shoulder strength grossly 5/5 MMT    Time 6    Period Weeks    Status Achieved      PT LONG TERM GOAL #5   Title Patient will report demonstrate >/= 55 deg cervical rotation to improve driving ability    Baseline cervical rotation >/=60 deg    Time 6    Period Weeks    Status Achieved      PT LONG TERM GOAL #6   Title Patient will report </= 2/10 cervical pain and resoluton of numbness/tingling with activity to reduce functional limitations    Baseline patient reports pain has returned in lower neck region    Time 8    Period Weeks     Status On-going    Target Date 06/23/21      PT LONG TERM GOAL #7   Title Patient will report low back/hip pain </= 2/10 in order to improve sitting and walking tolerance    Baseline patient reports 3/10 right lower back/right hip pain    Time 8    Period Weeks    Status On-going    Target Date 06/23/21      PT LONG TERM GOAL #8   Title Patient will exhibit hip strength >/= 4/5 MMT to reduce pain and lumbar compensation with household activities    Baseline grossly 4-/5 MMT  Time 8    Period Weeks    Status On-going    Target Date 06/23/21                   Plan - 04/28/21 1533     Clinical Impression Statement Patient tolerated therapy well with no adverse effects. This visit she notes regressing in her symptoms due to return or cervical pain and worsening of low back/right hip, right shoulder pain due to recent onset of vertigo that has limited her ability to perform exercise and has caused general exacerbation of symptoms. She does demonstrate continue good range of motion of the right shoulder but with increase pain level, continued strength deficit of postural and core musculature, tightness reported of cervical, lumbar, and right hip musculature. She did tolerate exercise this visit focused on gentle stretching and mobility, and core/postural activition in positions that did not exacerbate her dizziness. Updated HEP this visit and patient encouraged to seek vestibular therapy referral to address vertigo symptoms as these seem to be exacerbating other pain and limitations. She would benefit from continued skilled PT to progress mobility and strength in order to reduce low back, neck, and shoulder pain, and maximize her functional ability.    Personal Factors and Comorbidities Past/Current Experience;Time since onset of injury/illness/exacerbation;Comorbidity 3+    Comorbidities History of chronic neck pain, anxiety, depression, breast cancer    Examination-Activity  Limitations Sit;Sleep;Stand;Lift;Carry;Locomotion Level;Bend    Examination-Participation Restrictions Meal Prep;Cleaning;Community Activity;Driving;Shop;Laundry;Yard Work    Rehab Potential Good    PT Frequency 1x / week    PT Duration 8 weeks    PT Treatment/Interventions ADLs/Self Care Home Management;Aquatic Therapy;Cryotherapy;Electrical Stimulation;Iontophoresis 4mg /ml Dexamethasone;Moist Heat;Neuromuscular re-education;Therapeutic exercise;Therapeutic activities;Functional mobility training;Patient/family education;Manual techniques;Dry needling;Passive range of motion;Taping;Joint Manipulations;Traction;Ultrasound;Balance training;Spinal Manipulations;Vestibular    PT Next Visit Plan Review HEP and progress PRN, dry needling, continue muscle energy PRN, progress core and hip strengthening, manual for cervical/thoracic/lumbar mobility and muscular tension, DNF endurance training, neurodynamic PRN, progress rotator cuff and periscapular strength    PT Home Exercise Plan [email or web address]    Consulted and Agree with Plan of Care Patient             Patient will benefit from skilled therapeutic intervention in order to improve the following deficits and impairments:  Decreased range of motion, Postural dysfunction, Decreased strength, Pain, Decreased activity tolerance, Impaired UE functional use, Impaired sensation, Increased muscle spasms, Impaired flexibility, Decreased endurance  Visit Diagnosis: Cervicalgia  Chronic bilateral low back pain, unspecified whether sciatica present  Chronic right shoulder pain  Stiffness of right shoulder, not elsewhere classified  Muscle weakness (generalized)     Problem List Patient Active Problem List   Diagnosis Date Noted   Genetic testing 10/02/2019   Environmental and seasonal allergies 09/19/2019   Family history of colon cancer    Family history of melanoma    Family history of ovarian cancer    Family history of breast cancer    Anxiety  disorder 01/09/2019   Cervical stenosis of spine 10/21/2018   Insomnia 10/21/2018   Breast tenderness in female 02/05/2014   Pes planus of both feet 01/15/2014   Low back pain 11/23/2013   Phlebitis 10/06/2013   History of depression 09/26/2013   Breast cancer of upper-outer quadrant of right female breast ([place]) 08/18/2013   ADHD (attention deficit hyperactivity disorder) 12/01/2011   Headache 04/03/2009   Hypothyroidism 05/05/2007   GERD 05/05/2007   Osteoarthritis 05/05/2007    Hilda Blades, PT, DPT, LAT, ATC 04/28/21  3:50 PM Phone: [phone] Fax: [facility] [address] [place], [place], [address] Phone: [phone]   Fax:  [phone]  Name: Kristina Pearson MRN: [id number] Date of Birth: 07/02/1955

## 2021-05-05 ENCOUNTER — Encounter: Payer: Self-pay | Admitting: Physical Therapy

## 2021-05-05 ENCOUNTER — Other Ambulatory Visit: Payer: Self-pay

## 2021-05-05 ENCOUNTER — Ambulatory Visit: Payer: 59 | Admitting: Physical Therapy

## 2021-05-05 DIAGNOSIS — M545 Low back pain, unspecified: Secondary | ICD-10-CM

## 2021-05-05 DIAGNOSIS — M25611 Stiffness of right shoulder, not elsewhere classified: Secondary | ICD-10-CM

## 2021-05-05 DIAGNOSIS — G8929 Other chronic pain: Secondary | ICD-10-CM

## 2021-05-05 DIAGNOSIS — M6281 Muscle weakness (generalized): Secondary | ICD-10-CM

## 2021-05-05 DIAGNOSIS — M542 Cervicalgia: Secondary | ICD-10-CM | POA: Diagnosis not present

## 2021-05-05 NOTE — Therapy (Signed)
[place], [place], [address] Phone: [phone]   Fax:  [phone]  Physical Therapy Treatment  Patient Details  Name: Kristina Pearson MRN: [id number] Date of Birth: 06-Nov-1955 Referring Provider (PT): Norva Karvonen, MD   Encounter Date: 05/05/2021   PT End of Session - 05/05/21 1411     Visit Number 36    Number of Visits 43    Date for PT Re-Evaluation 06/23/21    Authorization Type AETNA    PT Start Time 1401    PT Stop Time 1450    PT Time Calculation (min) 49 min    Activity Tolerance Patient tolerated treatment well    Behavior During Therapy [facility] for tasks assessed/performed             Past Medical History:  Diagnosis Date   ADHD    ADHD (attention deficit hyperactivity disorder)    ADD   Allergy    Anxiety    Arthritis    osteoarthritrs   Breast cancer ([place]) 08/15/13   right lateral upper outer   Depression    Family history of breast cancer    Family history of colon cancer    Family history of melanoma    Family history of ovarian cancer    Fibromyalgia    GERD (gastroesophageal reflux disease)    TUMS   Headache(784.0)    sees Dr. Gwendel Hanson at [facility]    History of depression    Hx of radiation therapy 10/19/13- 11/09/13   right breast 4256 cGy in 16 sessions, hypo-fractionated   Hypothyroid    Migraine    Osteopenia    Personal history of radiation therapy 2015   PONV (postoperative nausea and vomiting)    Sialoadenitis    Spinal stenosis    Urinary incontinence    Vitiligo    Wears glasses     Past Surgical History:  Procedure Laterality Date   BREAST BIOPSY Right 07/2013   BREAST LUMPECTOMY Right 08/2013   BREAST LUMPECTOMY WITH RADIOACTIVE SEED LOCALIZATION Right 08/30/2013   Procedure: RIGHT PARTIAL MASTECTOMY WITH RADIOACTIVE SEED LOCALIZATION;  Surgeon: [name], MD;  Location: [facility];  Service: General;   Laterality: Right;   [place]   lap   COLONOSCOPY  01/22/2020   per Dr. Cristina Gong, benign polyps, repeat in 7 yrs    [place]      There were no vitals filed for this visit.   Subjective Assessment - 05/05/21 1402     Subjective Patient reports she is feeling better today. She went out and walked around the block on Saturday and states she did not feel any dizziness. She reports the last time she felt the vertigo was last Wednesday so has been feeling better. She does report that she woke up last night with right shoulder pain and needed to take a pain pill.    Patient Stated Goals Improve neck and lower back pain    Currently in Pain? Yes    Pain Score 5     Pain Location Neck    Pain Orientation Right;Left;Lower    Pain Descriptors / Indicators Tightness;Aching    Pain Type Chronic pain    Pain Onset More than a month ago    Pain Frequency Intermittent    Pain  Score 1    Pain Location Back    Pain Orientation Right;Left    Pain Descriptors / Indicators Tightness;Sore    Pain Type Chronic pain    Pain Onset More than a month ago    Pain Frequency Intermittent    Pain Score 4    Pain Location Shoulder    Pain Orientation Right    Pain Descriptors / Indicators Aching;Sore    Pain Type Chronic pain    Pain Onset More than a month ago    Pain Frequency Intermittent                [facility] PT Assessment - 05/05/21 0001       AROM   Cervical - Right Rotation 58    Cervical - Left Rotation 62                    [facility] Adult PT Treatment/Exercise:  Therapeutic Exercise: UBE L3 x 4 min (2 fwd/bwd) while taking subjective and planning session Upper trap stretch 2 x 20 sec Levator scap stretch 2 x 20 sec Sleep stretch 2 x 20 sec Row with green x 15 ER with green x 15 - towel roll under arm Extension + scap retraction with red x 15 Supine scap  protraction / retraction with shoulder at 9 deg flex x 10 - lying on FR Supine pec stretch at 90 deg 5 x 10 sec - lying on FR Supine horiz abduction with red x 15 - lying on FR  Manual Therapy: Skilled palpation and monitoring of soft tissue throughout process of TPDN Suboccipital release with gentle manual traction STM to right upper trap, levator, infraspinatus  Neuromuscular re-ed: N/A  Therapeutic Activity: N/A  Modalities: N/A  Self Care: N/A  Trigger Point Dry Needling Treatment: Pre-treatment instruction: Patient instructed on dry needling rationale, procedures, and possible side effects including pain during treatment (achy,cramping feeling), bruising, drop of blood, lightheadedness, nausea, sweating. Patient Consent Given: Yes Education handout provided: No Muscles treated: right levator scapulae, upper trap, infraspinatus, suboccipital muscles  Needle size and number: .30x55mm x 4 and .30x50mm x 2 Electrical stimulation performed: Yes Parameters: N/A Treatment response/outcome: Twitch response elicited, Palpable decrease in muscle tension, and patient reports improvement in tightness and pain level Post-treatment instructions: Patient instructed to expect possible mild to moderate muscle soreness later today and/or tomorrow. Patient instructed in methods to reduce muscle soreness and to continue prescribed HEP. If patient was dry needled over the lung field, patient was instructed on signs and symptoms of pneumothorax and, however unlikely, to see immediate medical attention should they occur. Patient was also educated on signs and symptoms of infection and to seek medical attention should they occur. Patient verbalized understanding of these instructions and education.                PT Education - 05/05/21 1411     Education Details HEP, dry needling    Person(s) Educated Patient    Methods Explanation;Demonstration;Verbal cues    Comprehension Verbalized  understanding;Returned demonstration;Verbal cues required;Need further instruction              PT Short Term Goals - 08/26/20 1404       PT SHORT TERM GOAL #1   Title Patient will be I with initial HEP to progress with PT    Time --    Period Weeks    Status Achieved    Target Date --      PT  SHORT TERM GOAL #2   Title Patient will demonstrate PROM >/= 120 deg elevation to reduce shoulder stiffness and progress allowable range    Baseline 128    Time --    Period --    Status Achieved    Target Date --      PT SHORT TERM GOAL #3   Title Patient will report </= 5/10 pain with therapy to reduce limitation and allow progression within protocol    Time --    Period --    Status Achieved    Target Date --      PT SHORT TERM GOAL #4   Title PT will review FOTO with patient by 3rd visit    Status Achieved               PT Long Term Goals - 04/28/21 1529       PT LONG TERM GOAL #1   Title Patient will be I with final HEP to maintain progress from PT    Baseline updated HEP this visit    Time 8    Period Weeks    Status On-going    Target Date 06/23/21      PT LONG TERM GOAL #2   Title Patient will report improved functional status >/= 55% on FOTO    Baseline FOTO for shoulder discharged - 01/27/2021    Time 6    Period Weeks    Status Deferred      PT LONG TERM GOAL #3   Title Patient will demonstrate right shoulder AROM grossly WFL to return to swimming    Baseline patient demonstrates right shoulder AROM grossly WFL    Time 6    Period Weeks    Status Achieved      PT LONG TERM GOAL #4   Title Patient will demonstrate right shoulder rotator cuff strength >/= 5/5 MMT to be able to lift objects into top cabinets and return to swimming    Baseline patient demonstrates right shoulder strength grossly 5/5 MMT    Time 6    Period Weeks    Status Achieved      PT LONG TERM GOAL #5   Title Patient will report demonstrate >/= 55 deg cervical rotation to  improve driving ability    Baseline cervical rotation >/=60 deg    Time 6    Period Weeks    Status Achieved      PT LONG TERM GOAL #6   Title Patient will report </= 2/10 cervical pain and resoluton of numbness/tingling with activity to reduce functional limitations    Baseline patient reports pain has returned in lower neck region    Time 8    Period Weeks    Status On-going    Target Date 06/23/21      PT LONG TERM GOAL #7   Title Patient will report low back/hip pain </= 2/10 in order to improve sitting and walking tolerance    Baseline patient reports 3/10 right lower back/right hip pain    Time 8    Period Weeks    Status On-going    Target Date 06/23/21      PT LONG TERM GOAL #8   Title Patient will exhibit hip strength >/= 4/5 MMT to reduce pain and lumbar compensation with household activities    Baseline grossly 4-/5 MMT    Time 8    Period Weeks    Status On-going    Target Date 06/23/21  Plan - 05/05/21 1437     Clinical Impression Statement Patient tolerated therapy well with no adverse effects. Utilized trigger point dry needling this visit with good response with patient reporting improvement in symptoms. Patient reporting trigger points of infraspinatus and levator scap recreated right shoulder pain. Continued progression of postural exercises with good tolerance and without any increase in pain. No changes to HEP but will assess her response to TPDN. She would benefit from continued skilled PT to progress mobility and strength in order to reduce low back, neck, and shoulder pain, and maximize her functional ability.    PT Treatment/Interventions ADLs/Self Care Home Management;Aquatic Therapy;Cryotherapy;Electrical Stimulation;Iontophoresis 4mg /ml Dexamethasone;Moist Heat;Neuromuscular re-education;Therapeutic exercise;Therapeutic activities;Functional mobility training;Patient/family education;Manual techniques;Dry needling;Passive range  of motion;Taping;Joint Manipulations;Traction;Ultrasound;Balance training;Spinal Manipulations;Vestibular    PT Next Visit Plan Review HEP and progress PRN, dry needling, continue muscle energy PRN, progress core and hip strengthening, manual for cervical/thoracic/lumbar mobility and muscular tension, DNF endurance training, neurodynamic PRN, progress rotator cuff and periscapular strength    PT Home Exercise Plan [id number]    Consulted and Agree with Plan of Care Patient             Patient will benefit from skilled therapeutic intervention in order to improve the following deficits and impairments:  Decreased range of motion, Postural dysfunction, Decreased strength, Pain, Decreased activity tolerance, Impaired UE functional use, Impaired sensation, Increased muscle spasms, Impaired flexibility, Decreased endurance  Visit Diagnosis: Cervicalgia  Chronic right shoulder pain  Chronic bilateral low back pain, unspecified whether sciatica present  Stiffness of right shoulder, not elsewhere classified  Muscle weakness (generalized)     Problem List Patient Active Problem List   Diagnosis Date Noted   Genetic testing 10/02/2019   Environmental and seasonal allergies 09/19/2019   Family history of colon cancer    Family history of melanoma    Family history of ovarian cancer    Family history of breast cancer    Anxiety disorder 01/09/2019   Cervical stenosis of spine 10/21/2018   Insomnia 10/21/2018   Breast tenderness in female 02/05/2014   Pes planus of both feet 01/15/2014   Low back pain 11/23/2013   Phlebitis 10/06/2013   History of depression 09/26/2013   Breast cancer of upper-outer quadrant of right female breast ([place]) 08/18/2013   ADHD (attention deficit hyperactivity disorder) 12/01/2011   Headache 04/03/2009   Hypothyroidism 05/05/2007   GERD 05/05/2007   Osteoarthritis 05/05/2007    Hilda Blades, PT, DPT, LAT, ATC 05/05/21  3:08 PM Phone:  [phone] Fax: [facility] [address] [place], [place], [address] Phone: [phone]   Fax:  [phone]  Name: Kristina Pearson MRN: [id number] Date of Birth: Nov 25, 1955

## 2021-05-20 ENCOUNTER — Other Ambulatory Visit: Payer: Self-pay

## 2021-05-20 ENCOUNTER — Encounter: Payer: Self-pay | Admitting: Physical Therapy

## 2021-05-20 ENCOUNTER — Ambulatory Visit: Payer: 59 | Admitting: Physical Therapy

## 2021-05-20 ENCOUNTER — Telehealth: Payer: Self-pay | Admitting: Family Medicine

## 2021-05-20 DIAGNOSIS — M6281 Muscle weakness (generalized): Secondary | ICD-10-CM

## 2021-05-20 DIAGNOSIS — M25611 Stiffness of right shoulder, not elsewhere classified: Secondary | ICD-10-CM

## 2021-05-20 DIAGNOSIS — M542 Cervicalgia: Secondary | ICD-10-CM

## 2021-05-20 DIAGNOSIS — G8929 Other chronic pain: Secondary | ICD-10-CM

## 2021-05-20 NOTE — Therapy (Signed)
[place], [place], [address] Phone: [phone]   Fax:  [phone]  Physical Therapy Treatment  Patient Details  Name: Kristina Pearson MRN: [id number] Date of Birth: [birth year] Referring Provider (PT): Norva Karvonen, MD   Encounter Date: 05/20/2021   PT End of Session - 05/20/21 1424     Visit Number 37    Number of Visits 43    Date for PT Re-Evaluation 06/23/21    Authorization Type AETNA    PT Start Time 1404    PT Stop Time 7425    PT Time Calculation (min) 41 min    Activity Tolerance Patient tolerated treatment well    Behavior During Therapy [facility] for tasks assessed/performed             Past Medical History:  Diagnosis Date   ADHD    ADHD (attention deficit hyperactivity disorder)    ADD   Allergy    Anxiety    Arthritis    osteoarthritrs   Breast cancer ([place]) 08/15/13   right lateral upper outer   Depression    Family history of breast cancer    Family history of colon cancer    Family history of melanoma    Family history of ovarian cancer    Fibromyalgia    GERD (gastroesophageal reflux disease)    TUMS   Headache(784.0)    sees Dr. Gwendel Hanson at [facility]    History of depression    Hx of radiation therapy 10/19/13- 11/09/13   right breast 4256 cGy in 16 sessions, hypo-fractionated   Hypothyroid    Migraine    Osteopenia    Personal history of radiation therapy 2015   PONV (postoperative nausea and vomiting)    Sialoadenitis    Spinal stenosis    Urinary incontinence    Vitiligo    Wears glasses     Past Surgical History:  Procedure Laterality Date   BREAST BIOPSY Right 07/2013   BREAST LUMPECTOMY Right 08/2013   BREAST LUMPECTOMY WITH RADIOACTIVE SEED LOCALIZATION Right 08/30/2013   Procedure: RIGHT PARTIAL MASTECTOMY WITH RADIOACTIVE SEED LOCALIZATION;  Surgeon: [name], MD;  Location: [facility];  Service: General;   Laterality: Right;   [place]   lap   COLONOSCOPY  01/22/2020   per Dr. Cristina Gong, benign polyps, repeat in 7 yrs    [place]      There were no vitals filed for this visit.   Subjective Assessment - 05/20/21 1406     Subjective Patient reports she is doing ok, she is having a little bit of a headache today. She reports the dizziness is pretty much fully gone. She reports the dry needling last visit helped her shoulder, it didn't feel as tight afterward. Patient states she is have a little pain today, more in the neck and headache, and then last night she was having some discomfort in her hips and low back, and also having pain fron big toe to knee.    Patient Stated Goals Improve neck and lower back pain    Currently in Pain? Yes    Pain Score 4     Pain Location Neck    Pain Orientation Right;Left    Pain Descriptors / Indicators Tightness;Aching;Headache    Pain Type Chronic pain  Pain Onset More than a month ago    Pain Frequency Constant    Aggravating Factors  Activity, sitting extended periods in poor posture    Pain Relieving Factors Medication                        [facility] Adult PT Treatment/Exercise:  Therapeutic Exercise: UBE L3 x 4 min (2 fwd/bwd) Supine chin tuck 10 x 5 sec Sidelying thoracic rotation 5 x 5 sec each Cat camel x 10 Child's pose stretch 2 x 30 sec Quadruped thoracic extension with physioball 5 x 5 sec Supine thoracic extension with elbows in 5 x 5 sec, with elbows flared out 5 x 5 sec  Doorway pec stretch 3 x 15 sec  Manual Therapy: Suboccipital release with gentle cervical traction x 5 bouts Passive cervical retractions Passive upper trap and levator stretch  Neuromuscular re-ed: N/A  Therapeutic Activity: N/A  Modalities: N/A  Self Care: N/A                 PT Education - 05/20/21 1406      Education Details HEP update    Person(s) Educated Patient    Methods Explanation;Demonstration;Verbal cues;Handout    Comprehension Verbalized understanding;Returned demonstration;Verbal cues required;Need further instruction              PT Short Term Goals - 08/26/20 1404       PT SHORT TERM GOAL #1   Title Patient will be I with initial HEP to progress with PT    Time --    Period Weeks    Status Achieved    Target Date --      PT SHORT TERM GOAL #2   Title Patient will demonstrate PROM >/= 120 deg elevation to reduce shoulder stiffness and progress allowable range    Baseline 128    Time --    Period --    Status Achieved    Target Date --      PT SHORT TERM GOAL #3   Title Patient will report </= 5/10 pain with therapy to reduce limitation and allow progression within protocol    Time --    Period --    Status Achieved    Target Date --      PT SHORT TERM GOAL #4   Title PT will review FOTO with patient by 3rd visit    Status Achieved               PT Long Term Goals - 04/28/21 1529       PT LONG TERM GOAL #1   Title Patient will be I with final HEP to maintain progress from PT    Baseline updated HEP this visit    Time 8    Period Weeks    Status On-going    Target Date 06/23/21      PT LONG TERM GOAL #2   Title Patient will report improved functional status >/= 55% on FOTO    Baseline FOTO for shoulder discharged - 01/27/2021    Time 6    Period Weeks    Status Deferred      PT LONG TERM GOAL #3   Title Patient will demonstrate right shoulder AROM grossly WFL to return to swimming    Baseline patient demonstrates right shoulder AROM grossly WFL    Time 6    Period Weeks    Status Achieved      PT LONG  TERM GOAL #4   Title Patient will demonstrate right shoulder rotator cuff strength >/= 5/5 MMT to be able to lift objects into top cabinets and return to swimming    Baseline patient demonstrates right shoulder strength grossly 5/5 MMT     Time 6    Period Weeks    Status Achieved      PT LONG TERM GOAL #5   Title Patient will report demonstrate >/= 55 deg cervical rotation to improve driving ability    Baseline cervical rotation >/=60 deg    Time 6    Period Weeks    Status Achieved      PT LONG TERM GOAL #6   Title Patient will report </= 2/10 cervical pain and resoluton of numbness/tingling with activity to reduce functional limitations    Baseline patient reports pain has returned in lower neck region    Time 8    Period Weeks    Status On-going    Target Date 06/23/21      PT LONG TERM GOAL #7   Title Patient will report low back/hip pain </= 2/10 in order to improve sitting and walking tolerance    Baseline patient reports 3/10 right lower back/right hip pain    Time 8    Period Weeks    Status On-going    Target Date 06/23/21      PT LONG TERM GOAL #8   Title Patient will exhibit hip strength >/= 4/5 MMT to reduce pain and lumbar compensation with household activities    Baseline grossly 4-/5 MMT    Time 8    Period Weeks    Status On-going    Target Date 06/23/21                   Plan - 05/20/21 1424     Clinical Impression Statement Patient tolerated therapy well with no adverse effects. She continues to report persistent neck and shoulder tightness and this visit with left sided frontal headache so therapy focused primarily on manual and stretching for mobility and reducing pain. Patient did report improvement in her symptoms post therapy. She was provided update to HEP for thoracic extension exercises with good tolerance. She would benefit from continued skilled PT to progress mobility and strength in order to reduce low back, neck, and shoulder pain, and maximize her functional ability.    PT Treatment/Interventions ADLs/Self Care Home Management;Aquatic Therapy;Cryotherapy;Electrical Stimulation;Iontophoresis 4mg /ml Dexamethasone;Moist Heat;Neuromuscular re-education;Therapeutic  exercise;Therapeutic activities;Functional mobility training;Patient/family education;Manual techniques;Dry needling;Passive range of motion;Taping;Joint Manipulations;Traction;Ultrasound;Balance training;Spinal Manipulations;Vestibular    PT Next Visit Plan Review HEP and progress PRN, dry needling, continue muscle energy PRN, progress core and hip strengthening, manual for cervical/thoracic/lumbar mobility and muscular tension, DNF endurance training, neurodynamic PRN, progress rotator cuff and periscapular strength    PT Home Exercise Plan [id number]    Consulted and Agree with Plan of Care Patient             Patient will benefit from skilled therapeutic intervention in order to improve the following deficits and impairments:  Decreased range of motion, Postural dysfunction, Decreased strength, Pain, Decreased activity tolerance, Impaired UE functional use, Impaired sensation, Increased muscle spasms, Impaired flexibility, Decreased endurance  Visit Diagnosis: Cervicalgia  Chronic bilateral low back pain, unspecified whether sciatica present  Stiffness of right shoulder, not elsewhere classified  Chronic right shoulder pain  Muscle weakness (generalized)     Problem List Patient Active Problem List   Diagnosis Date Noted   Genetic testing  10/02/2019   Environmental and seasonal allergies 09/19/2019   Family history of colon cancer    Family history of melanoma    Family history of ovarian cancer    Family history of breast cancer    Anxiety disorder 01/09/2019   Cervical stenosis of spine 10/21/2018   Insomnia 10/21/2018   Breast tenderness in female 02/05/2014   Pes planus of both feet 01/15/2014   Low back pain 11/23/2013   Phlebitis 10/06/2013   History of depression 09/26/2013   Breast cancer of upper-outer quadrant of right female breast ([place]) 08/18/2013   ADHD (attention deficit hyperactivity disorder) 12/01/2011   Headache 04/03/2009   Hypothyroidism 05/05/2007    GERD 05/05/2007   Osteoarthritis 05/05/2007    Hilda Blades, PT, DPT, LAT, ATC 05/20/21  3:01 PM Phone: [phone] Fax: [facility] [address] [place], [place], [address] Phone: [phone]   Fax:  [phone]  Name: Kristina Pearson MRN: [id number] Date of Birth: 03/30/1956

## 2021-05-20 NOTE — Telephone Encounter (Signed)
She already has refills until 07-02-21

## 2021-05-20 NOTE — Telephone Encounter (Signed)
Patient called to get refill on lisdexamfetamine (VYVANSE) 40 MG capsule      Please send to  [id number] [place] Phone:  [phone]  Fax:  [phone]       Good callback number is [phone]      Please advise

## 2021-05-22 MED ORDER — LISDEXAMFETAMINE DIMESYLATE 40 MG PO CAPS: 40.0000 mg | ORAL_CAPSULE | ORAL | 0 refills | Status: DC

## 2021-05-22 NOTE — Telephone Encounter (Signed)
Done

## 2021-05-22 NOTE — Telephone Encounter (Signed)
Spoke with pt state that she is low on medication and that last refill was sent to [place] but she is in [place], requests a refill sent to  [facility] [address]

## 2021-05-22 NOTE — Telephone Encounter (Signed)
Pt notified via My Chart

## 2021-05-23 ENCOUNTER — Ambulatory Visit (INDEPENDENT_AMBULATORY_CARE_PROVIDER_SITE_OTHER): Payer: 59 | Admitting: Family Medicine

## 2021-05-23 ENCOUNTER — Encounter: Payer: Self-pay | Admitting: Family Medicine

## 2021-05-23 VITALS — BP 134/86 | HR 110 | Temp 98.2°F | Ht 66.0 in | Wt 166.3 lb

## 2021-05-23 DIAGNOSIS — R202 Paresthesia of skin: Secondary | ICD-10-CM | POA: Diagnosis not present

## 2021-05-23 DIAGNOSIS — R519 Headache, unspecified: Secondary | ICD-10-CM

## 2021-05-23 DIAGNOSIS — Z23 Encounter for immunization: Secondary | ICD-10-CM | POA: Diagnosis not present

## 2021-05-23 NOTE — Progress Notes (Signed)
Established Patient Office Visit  Subjective:  Patient ID: Kristina Pearson, female    DOB: [birth year]  Age: 65 y.o. MRN: [id number]  CC:  Chief Complaint  Patient presents with   Trigeminal Neuralgia    HPI Kristina Pearson presents for evaluation for possible "trigeminal neuralgia ".  She has not formally been diagnosed with this in the past.  She relates some recent left facial pain mostly in the forehead region.  No associated rash.  She has had some intermittent numbness left cheek region.  No definite facial weakness.  She has had atypical headaches in the past and went to neurologist at [facility].  She initially was thought to have trigeminal neuralgia but diagnosed with cluster headaches.  She has occasional one-sided headaches but is having really more facial pain and paresthesias and headache at this time.  She already has appointment with [place] for next week and basically is requesting referral.  She apparently has been on gabapentin for some time and she thinks this helps her headaches and facial pain somewhat.   No recent clear triggers for pain.  Does not describe any pain in the second or third branch of the trigeminal nerve.  No recent visual changes.  No slurred speech.  No focal extremity weakness.  Past Medical History:  Diagnosis Date   ADHD    ADHD (attention deficit hyperactivity disorder)    ADD   Allergy    Anxiety    Arthritis    osteoarthritrs   Breast cancer ([place]) 08/15/13   right lateral upper outer   Depression    Family history of breast cancer    Family history of colon cancer    Family history of melanoma    Family history of ovarian cancer    Fibromyalgia    GERD (gastroesophageal reflux disease)    TUMS   Headache(784.0)    sees Dr. Gwendel Hanson at [facility]    History of depression    Hx of radiation therapy 10/19/13- 11/09/13   right breast 4256 cGy in 16 sessions, hypo-fractionated   Hypothyroid    Migraine    Osteopenia     Personal history of radiation therapy 2015   PONV (postoperative nausea and vomiting)    Sialoadenitis    Spinal stenosis    Urinary incontinence    Vitiligo    Wears glasses     Past Surgical History:  Procedure Laterality Date   BREAST BIOPSY Right 07/2013   BREAST LUMPECTOMY Right 08/2013   BREAST LUMPECTOMY WITH RADIOACTIVE SEED LOCALIZATION Right 08/30/2013   Procedure: RIGHT PARTIAL MASTECTOMY WITH RADIOACTIVE SEED LOCALIZATION;  Surgeon: [name], MD;  Location: [place];  Service: General;  Laterality: Right;   [place]   lap   COLONOSCOPY  01/22/2020   per Dr. Cristina Gong, benign polyps, repeat in 7 yrs    DIAGNOSTIC LAPAROSCOPY  1982   exp   LAPAROSCOPIC [place]      Family History  Problem Relation Age of Onset   Arthritis Mother    Diabetes Mother    Osteoporosis Mother    Polymyalgia rheumatica Mother        on prednisone   Heart disease Mother    Hyperlipidemia Mother    Hypertension Mother    Stroke Mother    Heart attack Father    Heart disease Father    Melanoma  Father    Colon cancer Maternal Uncle        dx in his 64s   Colon cancer Cousin 77       mother's maternal first cousin   Breast cancer Cousin 65       BRCA negative; father's maternal first cousin   Leukemia Maternal Uncle 48       AML   Melanoma Maternal Uncle 73   Leukemia Maternal Uncle 76   Ovarian cancer Cousin 36       maternal cousin   Ovarian cancer Cousin 43       maternal cousin   Leukemia Cousin        dx in his 32s; maternal cousin; thought to be the result of taking [place] attack Paternal Uncle    Melanoma Paternal Uncle    Colon cancer Cousin 23       maternal second cousin   Breast cancer Other        maternal great aunt dx in her 13s   Ovarian cancer Other        maternal grandfather's sister dx in her 35s   Colon cancer Other        maternal grandfather's sister dx  in her 52s   Colon cancer Other        maternal grandfather's sister dx in her 43s   Other Daughter        dermoid tumor   Diabetes Paternal Grandmother    Skin cancer Paternal Grandmother    Hypertension Paternal Grandmother    Melanoma Paternal Grandmother    Congestive Heart Failure Maternal Grandmother    Diabetes Maternal Grandmother    Other Maternal Aunt        COVID    Social History   Socioeconomic History   Marital status: Married    Spouse name: Not on file   Number of children: 1   Years of education: Not on file   Highest education level: Not on file  Occupational History   Not on file  Tobacco Use   Smoking status: Former    Types: Cigarettes    Quit date: 09/14/1978    Years since quitting: 42.7   Smokeless tobacco: Never   Tobacco comments:    short time in college  Vaping Use   Vaping Use: Never used  Substance and Sexual Activity   Alcohol use: Yes    Alcohol/week: 1.0 - 2.0 standard drink    Types: 1 - 2 Standard drinks or equivalent per week    Comment: social   Drug use: No   Sexual activity: Yes    Partners: Male    Birth control/protection: Post-menopausal    Comment: menarche age 68, first live birth age 64, menopause age 81, no HRT  Other Topics Concern   Not on file  Social History Narrative   Lives with husband, daughter and mother in a 2 story home.  Has one child.  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  Education: college degree   Social Determinants of Radio broadcast assistant Strain: Not on file  Food Insecurity: Not on file  Transportation Needs: Not on file  Physical Activity: Not on file  Stress: Not on file  Social Connections: Not on file  Intimate Partner Violence: Not on file    Outpatient Medications Prior to Visit  Medication Sig Dispense Refill   albuterol (VENTOLIN HFA) 108 (90 Base) MCG/ACT inhaler Inhale 2 puffs  into the lungs every 4 (four) hours as needed.     ALPRAZolam (XANAX) 0.5 MG tablet  TAKE 1 TABLET BY MOUTH 2 TIMES DAILY AS NEEDED FOR ANXIETY. 60 tablet 5   aspirin EC 81 MG tablet Take 1 tablet (81 mg total) by mouth daily. 1 tablet 0   Boswellia Serrata (BOSWELLIA PO) Take by mouth.     celecoxib (CELEBREX) 200 MG capsule Take 1 capsule (200 mg total) by mouth daily. 90 capsule 3   cyclobenzaprine (FLEXERIL) 10 MG tablet TAKE 1 TABLET BY MOUTH THREE TIMES A DAY AS NEEDED FOR MUSCLE SPASMS 90 tablet 0   cycloSPORINE (RESTASIS) 0.05 % ophthalmic emulsion Place 1 drop into both eyes 2 (two) times daily.     estradiol (ESTRACE VAGINAL) 0.1 MG/GM vaginal cream Place 1 Applicatorful vaginally 3 (three) times a week. 42.5 g 12   gabapentin (NEURONTIN) 100 MG capsule TAKE 1 CAPSULE BY MOUTH 3 TIMES DAILY AS NEEDED (HEADACHES). 90 capsule 5   gabapentin (NEURONTIN) 300 MG capsule TAKE 1 CAPSULE BY MOUTH EVERYDAY AT BEDTIME 30 capsule 3   levothyroxine (SYNTHROID, LEVOTHROID) 112 MCG tablet Take 112 mcg by mouth daily before breakfast.     [START ON 07/23/2021] lisdexamfetamine (VYVANSE) 40 MG capsule Take 1 capsule (40 mg total) by mouth every morning. 30 capsule 0   Magnesium 400 MG CAPS Take by mouth daily.     meclizine (ANTIVERT) 25 MG tablet Take 1 tablet (25 mg total) by mouth every 4 (four) hours as needed for dizziness or nausea. 60 tablet 3   Multiple Vitamin (MULTIVITAMIN) tablet Take 1 tablet by mouth daily. One a day for over age 12     mupirocin cream (BACTROBAN) 2 % Apply 1 application topically 2 (two) times daily. 30 g 0   temazepam (RESTORIL) 30 MG capsule TAKE 1 CAPSULE (30 MG TOTAL) BY MOUTH EVERY DAY AT BEDTIME AS NEEDED FOR SLEEP. 30 capsule 5   traMADol (ULTRAM) 50 MG tablet TAKE 2 TABLETS (100 MG TOTAL) BY MOUTH EVERY 6 (SIX) HOURS AS NEEDED FOR MODERATE PAIN. 120 tablet 5   UNABLE TO FIND CBD oil 20 mg - half a dropper at bedtime for pain or scalp psoriases     oxyCODONE (OXY IR/ROXICODONE) 5 MG immediate release tablet Take 5 mg by mouth 4 (four) times daily as  needed.     No facility-administered medications prior to visit.    No Known Allergies  ROS Review of Systems  Constitutional:  Negative for chills and fever.  Eyes:  Negative for visual disturbance.  Respiratory:  Negative for shortness of breath.   Cardiovascular:  Negative for chest pain.  Neurological:  Positive for numbness and headaches. Negative for dizziness, tremors, seizures, syncope, facial asymmetry, speech difficulty and weakness.  Hematological:  Negative for adenopathy.  Psychiatric/Behavioral:  Negative for confusion.      Objective:    Physical Exam Vitals reviewed.  Constitutional:      Appearance: Normal appearance.  HENT:     Head: Normocephalic and atraumatic.  Eyes:     Extraocular Movements: Extraocular movements intact.     Pupils: Pupils are equal, round, and reactive to light.  Cardiovascular:     Rate and Rhythm: Normal rate and regular rhythm.  Pulmonary:     Effort: Pulmonary effort is normal.     Breath sounds: Normal breath sounds.  Musculoskeletal:     Cervical back: Neck supple.  Lymphadenopathy:     Cervical: No cervical adenopathy.  Skin:    Findings: No rash.  Neurological:     General: No focal deficit present.     Mental Status: She is alert.     Cranial Nerves: No cranial nerve deficit.     Motor: No weakness.     Coordination: Coordination normal.     Gait: Gait normal.     Comments: No focal weakness    BP 134/86 (BP Location: Left Arm, Patient Position: Sitting, Cuff Size: Normal)   Pulse (!) 110   Temp 98.2 F (36.8 C) (Oral)   Ht 5' 6"  (1.676 m)   Wt 166 lb 4.8 oz (75.4 kg)   LMP 09/21/2006   SpO2 98%   BMI 26.84 kg/m  Wt Readings from Last 3 Encounters:  05/23/21 166 lb 4.8 oz (75.4 kg)  04/22/21 165 lb 1.6 oz (74.9 kg)  12/18/20 169 lb (76.7 kg)     Health Maintenance Due  Topic Date Due   HIV Screening  Never done   Zoster Vaccines- Shingrix (1 of 2) Never done   COVID-19 Vaccine (5 - Booster for  Pfizer series) 12/05/2020    There are no preventive care reminders to display for this patient.  Lab Results  Component Value Date   TSH 0.673 09/18/2019   Lab Results  Component Value Date   WBC 4.4 10/17/2020   HGB 12.9 10/17/2020   HCT 38.3 10/17/2020   MCV 95.3 10/17/2020   PLT 228.0 10/17/2020   Lab Results  Component Value Date   NA 140 10/17/2020   K 4.9 10/17/2020   CHLORIDE 105 02/21/2014   CO2 31 10/17/2020   GLUCOSE 88 10/17/2020   BUN 17 10/17/2020   CREATININE 1.04 10/17/2020   BILITOT 0.5 10/17/2020   ALKPHOS 54 10/17/2020   AST 18 10/17/2020   ALT 13 10/17/2020   PROT 6.8 10/17/2020   ALBUMIN 4.3 10/17/2020   CALCIUM 9.9 10/17/2020   ANIONGAP 8 09/30/2014   GFR 56.59 (L) 10/17/2020   Lab Results  Component Value Date   CHOL 198 10/17/2020   Lab Results  Component Value Date   HDL 86.60 10/17/2020   Lab Results  Component Value Date   LDLCALC 91 10/17/2020   Lab Results  Component Value Date   TRIG 102.0 10/17/2020   Lab Results  Component Value Date   CHOLHDL 2 10/17/2020   Lab Results  Component Value Date   HGBA1C 5.1 10/17/2020      Assessment & Plan:   Patient presents with some recent atypical pains left side of face and intermittent paresthesias and numbness left cheek region.  No evidence for any weakness on exam at this time.  No skin rashes.  Doubt trigeminal neuralgia based on her recent symptoms.  She already has pending appointment with [facility] next week and we will go ahead with referral.  Patient requesting pneumonia vaccine.  Has not had this previously.  Prevnar 20 given.   No orders of the defined types were placed in this encounter.   Follow-up: No follow-ups on file.    Carolann Littler, MD

## 2021-05-28 ENCOUNTER — Encounter: Payer: Self-pay | Admitting: Physical Therapy

## 2021-05-28 ENCOUNTER — Ambulatory Visit: Payer: 59 | Attending: Physician Assistant | Admitting: Physical Therapy

## 2021-05-28 ENCOUNTER — Other Ambulatory Visit: Payer: Self-pay

## 2021-05-28 DIAGNOSIS — M25511 Pain in right shoulder: Secondary | ICD-10-CM | POA: Diagnosis present

## 2021-05-28 DIAGNOSIS — M545 Low back pain, unspecified: Secondary | ICD-10-CM | POA: Insufficient documentation

## 2021-05-28 DIAGNOSIS — M6281 Muscle weakness (generalized): Secondary | ICD-10-CM

## 2021-05-28 DIAGNOSIS — M542 Cervicalgia: Secondary | ICD-10-CM | POA: Diagnosis present

## 2021-05-28 DIAGNOSIS — G8929 Other chronic pain: Secondary | ICD-10-CM | POA: Diagnosis present

## 2021-05-28 DIAGNOSIS — M25611 Stiffness of right shoulder, not elsewhere classified: Secondary | ICD-10-CM | POA: Diagnosis present

## 2021-05-28 NOTE — Therapy (Addendum)
Kristina Pearson, [place], [address] Phone: [phone]   Fax:  [phone]  Physical Therapy Treatment / Discharge  Patient Details  Name: [name] MRN: [id number] Date of Birth: [birth year] Referring Provider (PT): Norva Karvonen, MD   Encounter Date: 05/28/2021   PT End of Session - 05/28/21 1441     Visit Number 38    Number of Visits 43    Date for PT Re-Evaluation 06/23/21    Authorization Type AETNA    PT Start Time 1410   patient arrived late   PT Stop Time 1454   6 min TPDN performed   PT Time Calculation (min) 44 min    Activity Tolerance Patient tolerated treatment well    Behavior During Therapy [facility] for tasks assessed/performed             Past Medical History:  Diagnosis Date   ADHD    ADHD (attention deficit hyperactivity disorder)    ADD   Allergy    Anxiety    Arthritis    osteoarthritrs   Breast cancer ([place]) 08/15/13   right lateral upper outer   Depression    Family history of breast cancer    Family history of colon cancer    Family history of melanoma    Family history of ovarian cancer    Fibromyalgia    GERD (gastroesophageal reflux disease)    TUMS   Headache(784.0)    sees Dr[facility] Gwendel Hanson at [facility]    History of depression    Hx of radiation therapy 10/19/13- 11/09/13   right breast 4256 cGy in 16 sessions, hypo-fractionated   Hypothyroid    Migraine    Osteopenia    Personal history of radiation therapy 2015   PONV (postoperative nausea and vomiting)    Sialoadenitis    Spinal stenosis    Urinary incontinence    Vitiligo    Wears glasses     Past Surgical History:  Procedure Laterality Date   BREAST BIOPSY Right 07/2013   BREAST LUMPECTOMY Right 08/2013   BREAST LUMPECTOMY WITH RADIOACTIVE SEED LOCALIZATION Right 08/30/2013   Procedure: RIGHT PARTIAL MASTECTOMY WITH RADIOACTIVE SEED LOCALIZATION;  Surgeon: [name], MD;   Location: [place];  Service: General;  Laterality: Right;   [place]   lap   COLONOSCOPY  01/22/2020   per Dr. Cristina Gong, benign polyps, repeat in 7 yrs    [place]      There were no vitals filed for this visit.   Subjective Assessment - 05/28/21 1412     Subjective Patient reports she has been doing ok, she feels like the pain/pressure from the left forehead last visit was coming from the neck due to one position relieving the pain. She did report that the pressure lifted on the left and then came back on the right side but less severe. She reports her neck is a little stifff but not having much pain at the moment.    Patient Stated Goals Improve neck and lower back pain    Currently in Pain? No/denies    Pain Score 2     Pain Location Neck    Pain Descriptors / Indicators Tightness    Pain Type Chronic pain    Pain Onset More than a month ago  Pain Frequency Constant    Aggravating Factors  Activity, headaches, poor posture, weather                [facility] PT Assessment - 05/28/21 0001       AROM   Cervical Flexion 40    Cervical Extension 45    Cervical - Right Side Bend 20    Cervical - Left Side Bend 25    Cervical - Right Rotation 60    Cervical - Left Rotation 68                  [facility] Adult PT Treatment/Exercise:   Therapeutic Exercise: UBE L3 x 4 min (2 fwd/bwd) Sidelying thoracic rotation 5 x 5 sec each Child's pose stretch 2 x 30 sec fwd, x 30 sec lateral each Cat camel x 10 Quadruped chin tuck 10 x 5 sec Doorway pec stretch 3 x 15 sec   Manual Therapy: Skilled palpation and monitoring of muscular tension throughout TPDN treatment STM for right upper trap Suboccipital release with gentle cervical traction x 5 bouts Passive cervical retractions Passive upper trap and levator stretch   Neuromuscular  re-ed: N/A   Therapeutic Activity: N/A   Modalities: N/A   Self Care: N/A  Trigger Point Dry Needling Treatment: Pre-treatment instruction: Patient instructed on dry needling rationale, procedures, and possible side effects including pain during treatment (achy,cramping feeling), bruising, drop of blood, lightheadedness, nausea, sweating. Patient Consent Given: Yes Education handout provided: No Muscles treated: right upper trap, levator scap, suboccipitals  Needle size and number: .30x1m x 4 Electrical stimulation performed: No Parameters: N/A Treatment response/outcome: Twitch response elicited, Palpable decrease in muscle tension, and patient reported reduced tightness with improved cervical motion Post-treatment instructions: Patient instructed to expect possible mild to moderate muscle soreness later today and/or tomorrow. Patient instructed in methods to reduce muscle soreness and to continue prescribed HEP. If patient was dry needled over the lung field, patient was instructed on signs and symptoms of pneumothorax and, however unlikely, to see immediate medical attention should they occur. Patient was also educated on signs and symptoms of infection and to seek medical attention should they occur. Patient verbalized understanding of these instructions and education.                 PT Education - 05/28/21 1441     Education Details HEP    Person(s) Educated Patient    Methods Explanation;Demonstration;Verbal cues    Comprehension Verbalized understanding;Returned demonstration;Verbal cues required;Need further instruction              PT Short Term Goals - 08/26/20 1404       PT SHORT TERM GOAL #1   Title Patient will be I with initial HEP to progress with PT    Time --    Period Weeks    Status Achieved    Target Date --      PT SHORT TERM GOAL #2   Title Patient will demonstrate PROM >/= 120 deg elevation to reduce shoulder stiffness and  progress allowable range    Baseline 128    Time --    Period --    Status Achieved    Target Date --      PT SHORT TERM GOAL #3   Title Patient will report </= 5/10 pain with therapy to reduce limitation and allow progression within protocol    Time --    Period --    Status Achieved  Target Date --      PT SHORT TERM GOAL #4   Title PT will review FOTO with patient by 3rd visit    Status Achieved               PT Long Term Goals - 04/28/21 1529       PT LONG TERM GOAL #1   Title Patient will be I with final HEP to maintain progress from PT    Baseline updated HEP this visit    Time 8    Period Weeks    Status On-going    Target Date 06/23/21      PT LONG TERM GOAL #2   Title Patient will report improved functional status >/= 55% on FOTO    Baseline FOTO for shoulder discharged - 01/27/2021    Time 6    Period Weeks    Status Deferred      PT LONG TERM GOAL #3   Title Patient will demonstrate right shoulder AROM grossly WFL to return to swimming    Baseline patient demonstrates right shoulder AROM grossly WFL    Time 6    Period Weeks    Status Achieved      PT LONG TERM GOAL #4   Title Patient will demonstrate right shoulder rotator cuff strength >/= 5/5 MMT to be able to lift objects into top cabinets and return to swimming    Baseline patient demonstrates right shoulder strength grossly 5/5 MMT    Time 6    Period Weeks    Status Achieved      PT LONG TERM GOAL #5   Title Patient will report demonstrate >/= 55 deg cervical rotation to improve driving ability    Baseline cervical rotation >/=60 deg    Time 6    Period Weeks    Status Achieved      PT LONG TERM GOAL #6   Title Patient will report </= 2/10 cervical pain and resoluton of numbness/tingling with activity to reduce functional limitations    Baseline patient reports pain has returned in lower neck region    Time 8    Period Weeks    Status On-going    Target Date 06/23/21      PT  LONG TERM GOAL #7   Title Patient will report low back/hip pain </= 2/10 in order to improve sitting and walking tolerance    Baseline patient reports 3/10 right lower back/right hip pain    Time 8    Period Weeks    Status On-going    Target Date 06/23/21      PT LONG TERM GOAL #8   Title Patient will exhibit hip strength >/= 4/5 MMT to reduce pain and lumbar compensation with household activities    Baseline grossly 4-/5 MMT    Time 8    Period Weeks    Status On-going    Target Date 06/23/21                   Plan - 05/28/21 1443     Clinical Impression Statement Patient tolerated therapy well with no adverse effects. Continued with TPDN this visit for right side upper trap, levator scap, and suboccipitals with good tolerance and patient reported reduced tightness with improved cervical motion. She did report concordant right sided symptoms with suboccipital treatment. Continue with thoracic mobility and postural control with good tolerance and no changes made to HEP this visit. She would benefit from continued skilled  PT to progress mobility and strength in order to reduce low back, neck, and shoulder pain, and maximize her functional ability.    PT Treatment/Interventions ADLs/Self Care Home Management;Aquatic Therapy;Cryotherapy;Electrical Stimulation;Iontophoresis 20m/ml Dexamethasone;Moist Heat;Neuromuscular re-education;Therapeutic exercise;Therapeutic activities;Functional mobility training;Patient/family education;Manual techniques;Dry needling;Passive range of motion;Taping;Joint Manipulations;Traction;Ultrasound;Balance training;Spinal Manipulations;Vestibular    PT Next Visit Plan Review HEP and progress PRN, dry needling, progress core and hip strengthening, manual for cervical/thoracic/lumbar mobility and muscular tension, DNF endurance training, neurodynamic PRN, progress rotator cuff and periscapular strength    PT Home Exercise Plan [id number]    Consulted and Agree  with Plan of Care Patient             Patient will benefit from skilled therapeutic intervention in order to improve the following deficits and impairments:  Decreased range of motion, Postural dysfunction, Decreased strength, Pain, Decreased activity tolerance, Impaired UE functional use, Impaired sensation, Increased muscle spasms, Impaired flexibility, Decreased endurance  Visit Diagnosis: Cervicalgia  Chronic bilateral low back pain, unspecified whether sciatica present  Stiffness of right shoulder, not elsewhere classified  Chronic right shoulder pain  Muscle weakness (generalized)     Problem List Patient Active Problem List   Diagnosis Date Noted   Genetic testing 10/02/2019   Environmental and seasonal allergies 09/19/2019   Family history of colon cancer    Family history of melanoma    Family history of ovarian cancer    Family history of breast cancer    Anxiety disorder 01/09/2019   Cervical stenosis of spine 10/21/2018   Insomnia 10/21/2018   Breast tenderness in female 02/05/2014   Pes planus of both feet 01/15/2014   Low back pain 11/23/2013   Phlebitis 10/06/2013   History of depression 09/26/2013   [place] of upper[place]outer quadrant [place]18[place]   ADHD (attention deficit hyperactivity disorder) 12/01/2011   Headache 04/03/2009   Hypothyroidism 05/05/2007   GERD 05/05/2007   Osteoarthritis 05/05/2007    CHilda Blades PT, DPT, LAT, ATC 05/28/21  3:04 PM Phone: [phone]: [address] [address]Phone: [phone]  Fax:  [phone] Name: AFAWNA CRANMERMRN: [id number] of Birth: 404/18/1957  PHYSICAL THERAPY DISCHARGE SUMMARY  Visits from Start of Care: 362 Current functional level related to goals / functional outcomes: See above   Remaining deficits: See above   Education / Equipment: HEP    Patient agrees to discharge. Patient goals were partially met. Patient is being discharged due to not returning since the last visit.

## 2021-05-29 ENCOUNTER — Ambulatory Visit
Admission: RE | Admit: 2021-05-29 | Discharge: 2021-05-29 | Disposition: A | Discharge status: Home / Self Care | Payer: 59 | Source: Ambulatory Visit | Attending: Adult Health | Admitting: Adult Health

## 2021-05-29 ENCOUNTER — Ambulatory Visit: Payer: 59

## 2021-05-29 DIAGNOSIS — Z1231 Encounter for screening mammogram for malignant neoplasm of breast: Secondary | ICD-10-CM

## 2021-06-03 ENCOUNTER — Ambulatory Visit: Payer: 59 | Admitting: Physical Therapy

## 2021-06-10 ENCOUNTER — Ambulatory Visit: Payer: 59 | Admitting: Physical Therapy

## 2021-06-11 ENCOUNTER — Ambulatory Visit: Payer: 59

## 2021-06-16 ENCOUNTER — Other Ambulatory Visit: Payer: Self-pay | Admitting: Family Medicine

## 2021-06-18 ENCOUNTER — Telehealth: Payer: Self-pay | Admitting: Family Medicine

## 2021-06-18 NOTE — Telephone Encounter (Signed)
Pharmacy updated.   Please advise 

## 2021-06-18 NOTE — Telephone Encounter (Signed)
Patient called because she needs a early refill authorization faxed over for her lisdexamfetamine (VYVANSE) 40 MG capsule Patient was given a 30 day supply but there is 31 days in December so she has ran out. Patient cannot get it filled until 06/22/2021, so pharmacy is asking for refill date to be changed to 06/21/21  Please send to  [id number] [address], [place] Phone:  [phone]  Fax:  [phone]           Please advise

## 2021-06-19 NOTE — Telephone Encounter (Signed)
Spoke with patient about message, voiced understanding.  

## 2021-06-19 NOTE — Telephone Encounter (Signed)
No, we need to keep it at the 1st as written. If we change the first refill, then all the others get messed up as well. This always happens on months with 31 days, just the way it is

## 2021-07-16 ENCOUNTER — Other Ambulatory Visit: Payer: Self-pay | Admitting: Family Medicine

## 2021-07-16 NOTE — Telephone Encounter (Signed)
Last refill- 06/17/21 Last OV- 05/23/21  No future OV scheduled.   Can this patient receive a refill?

## 2021-07-18 ENCOUNTER — Emergency Department (HOSPITAL_BASED_OUTPATIENT_CLINIC_OR_DEPARTMENT_OTHER): Payer: 59

## 2021-07-18 ENCOUNTER — Other Ambulatory Visit: Payer: Self-pay

## 2021-07-18 ENCOUNTER — Emergency Department (HOSPITAL_BASED_OUTPATIENT_CLINIC_OR_DEPARTMENT_OTHER)
Admission: EM | Admit: 2021-07-18 | Discharge: 2021-07-18 | Disposition: A | Discharge status: Home / Self Care | Payer: 59 | Attending: Emergency Medicine | Admitting: Emergency Medicine

## 2021-07-18 ENCOUNTER — Encounter (HOSPITAL_BASED_OUTPATIENT_CLINIC_OR_DEPARTMENT_OTHER): Payer: Self-pay

## 2021-07-18 DIAGNOSIS — R000 Tachycardia, unspecified: Secondary | ICD-10-CM | POA: Diagnosis present

## 2021-07-18 DIAGNOSIS — I10 Essential (primary) hypertension: Secondary | ICD-10-CM | POA: Insufficient documentation

## 2021-07-18 DIAGNOSIS — E039 Hypothyroidism, unspecified: Secondary | ICD-10-CM | POA: Diagnosis not present

## 2021-07-18 DIAGNOSIS — Z79899 Other long term (current) drug therapy: Secondary | ICD-10-CM | POA: Insufficient documentation

## 2021-07-18 DIAGNOSIS — Z7982 Long term (current) use of aspirin: Secondary | ICD-10-CM | POA: Insufficient documentation

## 2021-07-18 DIAGNOSIS — R059 Cough, unspecified: Secondary | ICD-10-CM | POA: Insufficient documentation

## 2021-07-18 DIAGNOSIS — Z853 Personal history of malignant neoplasm of breast: Secondary | ICD-10-CM | POA: Diagnosis not present

## 2021-07-18 DIAGNOSIS — R002 Palpitations: Secondary | ICD-10-CM

## 2021-07-18 LAB — CBC WITH DIFFERENTIAL/PLATELET
Abs Immature Granulocytes: 0.06 10*3/uL (ref 0.00–0.07)
Basophils Absolute: 0 10*3/uL (ref 0.0–0.1)
Basophils Relative: 0 %
Eosinophils Absolute: 0 10*3/uL (ref 0.0–0.5)
Eosinophils Relative: 0 %
HCT: 38.2 % (ref 36.0–46.0)
Hemoglobin: 12.5 g/dL (ref 12.0–15.0)
Immature Granulocytes: 0 %
Lymphocytes Relative: 10 %
Lymphs Abs: 1.4 10*3/uL (ref 0.7–4.0)
MCH: 30.6 pg (ref 26.0–34.0)
MCHC: 32.7 g/dL (ref 30.0–36.0)
MCV: 93.6 fL (ref 80.0–100.0)
Monocytes Absolute: 0.8 10*3/uL (ref 0.1–1.0)
Monocytes Relative: 6 %
Neutro Abs: 11.9 10*3/uL — ABNORMAL HIGH (ref 1.7–7.7)
Neutrophils Relative %: 84 %
Platelets: 370 10*3/uL (ref 150–400)
RBC: 4.08 MIL/uL (ref 3.87–5.11)
RDW: 12.5 % (ref 11.5–15.5)
WBC: 14.1 10*3/uL — ABNORMAL HIGH (ref 4.0–10.5)
nRBC: 0 % (ref 0.0–0.2)

## 2021-07-18 LAB — BASIC METABOLIC PANEL
Anion gap: 10 (ref 5–15)
BUN: 21 mg/dL (ref 8–23)
CO2: 25 mmol/L (ref 22–32)
Calcium: 9.7 mg/dL (ref 8.9–10.3)
Chloride: 99 mmol/L (ref 98–111)
Creatinine, Ser: 0.91 mg/dL (ref 0.44–1.00)
GFR, Estimated: >60 mL/min (ref >60)
Glucose, Bld: 111 mg/dL — ABNORMAL HIGH (ref 70–99)
Potassium: 3.4 mmol/L — ABNORMAL LOW (ref 3.5–5.1)
Sodium: 134 mmol/L — ABNORMAL LOW (ref 135–145)

## 2021-07-18 LAB — TSH: TSH: 0.776 u[IU]/mL (ref 0.350–4.500)

## 2021-07-18 MED ORDER — TRAMADOL HCL 50 MG PO TABS
50.0000 mg | ORAL_TABLET | Freq: Once | ORAL | Status: AC
  Administered 2021-07-18: 50 mg via ORAL
  Filled 2021-07-18: qty 1

## 2021-07-18 MED ORDER — IOHEXOL 350 MG/ML SOLN
100.0000 mL | Freq: Once | INTRAVENOUS | Status: AC | PRN
  Administered 2021-07-18: 100 mL via INTRAVENOUS

## 2021-07-18 NOTE — ED Notes (Signed)
IV infiltration; approx 40 cc IV contrast infiltrated; when IV catheter was removed, catheter was shown to be bent; IV injection rate was 81ml/sec with "normal graphing";   IV removed and alternate IV site acquired for continuation of examination.

## 2021-07-18 NOTE — ED Provider Notes (Signed)
[facility] Provider Note   CSN: [id number] Arrival date & time: 07/18/21  1620     History  Chief Complaint  Patient presents with   Irregular Heart Beat    Kristina Pearson is a 66 y.o. female.  Patient concerned about rapid heart rate.  Her Fitbit has recorded heart rate up to 140.  But most of the time is just recording it in the low 100s.  Patient without any real chest pain.  Occasional shortness of breath but oxygen saturations 100%.  This is been ongoing for a while.  Having difficulty getting into see her primary care doctor.  Also her blood pressures been higher than usual.  Patient does have a history of anxiety is on benzo diazepam's for that.  And did take a dose of her Xanax prior to coming in.  She had called the nurse line and they recommended that she get seen which made her anxious.  Patient without a known history of arrhythmia.  Patient has had a little bit of a productive cough.  Patient on Wednesday had a steroid injection for back problem.  No real chest pain associated with it.  Past medical history significant for hypothyroid history of breast cancer with radiation treatment in 2015 fibromyalgia migraines and a history of anxiety depression attention deficit disorder.      Home Medications Prior to Admission medications   Medication Sig Start Date End Date Taking? Authorizing Provider  albuterol (VENTOLIN HFA) 108 (90 Base) MCG/ACT inhaler Inhale 2 puffs into the lungs every 4 (four) hours as needed. 01/19/20   [provider]  ALPRAZolam (XANAX) 0.5 MG tablet TAKE 1 TABLET BY MOUTH 2 TIMES DAILY AS NEEDED FOR ANXIETY. 03/19/21   Laurey Morale, MD  aspirin EC 81 MG tablet Take 1 tablet (81 mg total) by mouth daily. 03/26/14   Laurey Morale, MD  Boswellia Serrata (BOSWELLIA PO) Take by mouth.    [provider]  celecoxib (CELEBREX) 200 MG capsule Take 1 capsule (200 mg total) by mouth daily. 12/18/20   Laurey Morale, MD   cyclobenzaprine (FLEXERIL) 10 MG tablet TAKE 1 TABLET BY MOUTH THREE TIMES A DAY AS NEEDED FOR MUSCLE SPASMS 07/17/21   Laurey Morale, MD  cycloSPORINE (RESTASIS) 0.05 % ophthalmic emulsion Place 1 drop into both eyes 2 (two) times daily.    [provider]  estradiol (ESTRACE VAGINAL) 0.1 MG/GM vaginal cream Place 1 Applicatorful vaginally 3 (three) times a week. 08/09/19   Causey, Charlestine Massed, NP  gabapentin (NEURONTIN) 100 MG capsule TAKE 1 CAPSULE BY MOUTH 3 TIMES DAILY AS NEEDED (HEADACHES). 03/21/21   Laurey Morale, MD  gabapentin (NEURONTIN) 300 MG capsule TAKE 1 CAPSULE BY MOUTH EVERYDAY AT BEDTIME 10/22/20   Laurey Morale, MD  levothyroxine (SYNTHROID, LEVOTHROID) 112 MCG tablet Take 112 mcg by mouth daily before breakfast.    [provider]  lisdexamfetamine (VYVANSE) 40 MG capsule Take 1 capsule (40 mg total) by mouth every morning. 07/23/21 08/22/21  Laurey Morale, MD  Magnesium 400 MG CAPS Take by mouth daily.    [provider]  meclizine (ANTIVERT) 25 MG tablet Take 1 tablet (25 mg total) by mouth every 4 (four) hours as needed for dizziness or nausea. 05/22/20   Laurey Morale, MD  Multiple Vitamin (MULTIVITAMIN) tablet Take 1 tablet by mouth daily. One a day for over age 30    [provider]  mupirocin cream (BACTROBAN) 2 %  Apply 1 application topically 2 (two) times daily. 05/31/20   Laurey Morale, MD  temazepam (RESTORIL) 30 MG capsule TAKE 1 CAPSULE (30 MG TOTAL) BY MOUTH EVERY DAY AT BEDTIME AS NEEDED FOR SLEEP. 02/17/21   Laurey Morale, MD  traMADol (ULTRAM) 50 MG tablet TAKE 2 TABLETS (100 MG TOTAL) BY MOUTH EVERY 6 (SIX) HOURS AS NEEDED FOR MODERATE PAIN. 04/16/21   Laurey Morale, MD  UNABLE TO FIND CBD oil 20 mg - half a dropper at bedtime for pain or scalp psoriases    [provider]      Allergies    Patient has no known allergies.    Review of Systems   Review of Systems  Constitutional:  Negative for chills and  fever.  HENT:  Negative for ear pain and sore throat.   Eyes:  Negative for pain and visual disturbance.  Respiratory:  Positive for cough and shortness of breath.   Cardiovascular:  Positive for palpitations. Negative for chest pain and leg swelling.  Gastrointestinal:  Negative for abdominal pain and vomiting.  Genitourinary:  Negative for dysuria and hematuria.  Musculoskeletal:  Negative for arthralgias and back pain.  Skin:  Negative for color change and rash.  Neurological:  Negative for seizures and syncope.  All other systems reviewed and are negative.  Physical Exam Updated Vital Signs BP 97/75    Pulse (!) 106    Temp 98.3 F (36.8 C)    Resp 20    Ht 1.676 m (5\' 6" )    Wt 74.8 kg    LMP 09/21/2006    SpO2 100%    BMI 26.63 kg/m  Physical Exam Vitals and nursing note reviewed.  Constitutional:      General: She is not in acute distress.    Appearance: Normal appearance. She is well-developed.  HENT:     Head: Normocephalic and atraumatic.  Eyes:     Extraocular Movements: Extraocular movements intact.     Conjunctiva/sclera: Conjunctivae normal.     Pupils: Pupils are equal, round, and reactive to light.  Cardiovascular:     Rate and Rhythm: Regular rhythm. Tachycardia present.     Heart sounds: No murmur heard. Pulmonary:     Effort: Pulmonary effort is normal. No respiratory distress.     Breath sounds: Normal breath sounds.  Abdominal:     Palpations: Abdomen is soft.     Tenderness: There is no abdominal tenderness.  Musculoskeletal:        General: No swelling.     Cervical back: Normal range of motion and neck supple.  Skin:    General: Skin is warm and dry.     Capillary Refill: Capillary refill takes less than 2 seconds.  Neurological:     General: No focal deficit present.     Mental Status: She is alert and oriented to person, place, and time.     Cranial Nerves: No cranial nerve deficit.     Sensory: No sensory deficit.     Motor: No weakness.   Psychiatric:        Mood and Affect: Mood normal.    ED Results / Procedures / Treatments   Labs (all labs ordered are listed, but only abnormal results are displayed) Labs Reviewed  CBC WITH DIFFERENTIAL/PLATELET - Abnormal; Notable for the following components:      Result Value   WBC 14.1 (*)    Neutro Abs 11.9 (*)    All other components within normal  limits  BASIC METABOLIC PANEL - Abnormal; Notable for the following components:   Sodium 134 (*)    Potassium 3.4 (*)    Glucose, Bld 111 (*)    All other components within normal limits  TSH    EKG EKG Interpretation  Date/Time:  Friday July 18 2021 16:34:32 EST Ventricular Rate:  115 PR Interval:  90 QRS Duration: 91 QT Interval:  403 QTC Calculation: 558 R Axis:   38 Text Interpretation: Sinus tachycardia Borderline repol abnormality, diffuse leads Prolonged QT interval Confirmed by Fredia Sorrow [phone]) on 07/18/2021 4:43:26 PM  Radiology DG Chest Port 1 View  Result Date: 07/18/2021 CLINICAL DATA:  Palpitations EXAM: PORTABLE CHEST 1 VIEW COMPARISON:  Chest x-ray 05/13/2020 FINDINGS: The heart and mediastinal contours are unchanged. Query interval development of trace airspace opacity at the right base. Otherwise no focal consolidation. No pulmonary edema. No pleural effusion. No pneumothorax. No acute osseous abnormality. Surgical clips overlie the right chest. IMPRESSION: Query interval development of trace airspace opacity at the right base. Electronically Signed   By: Iven Finn M.D.   On: 07/18/2021 17:19    Procedures Procedures  Cardiac monitor here has been a sinus tachycardia.  No arrhythmias.  Medications Ordered in ED Medications  iohexol (OMNIPAQUE) 350 MG/ML injection 100 mL (100 mLs Intravenous Contrast Given 07/18/21 1806)    ED Course/ Medical Decision Making/ A&P                           Medical Decision Making Amount and/or Complexity of Data Reviewed Labs: ordered. Radiology:  ordered.  Risk Prescription drug management.   Patient with a story for at least sinus tachycardia.  But could very well be an arrhythmia.  But not captured or identified.  Work-up here mild leukocytosis with a white count of 14,000 which could be due to the steroid injection.  Hemoglobin is good at 12.5.  Basic metabolic panel mild hypokalemia with a potassium of 3.4 glucose was normal renal function normal.  Chest x-ray raises some question of a airspace opacity at the right base.  Patient did have radiation to that breast area in the past so could be scar.  But since patient having a bit of a productive cough and in consultation with her she decided to go ahead and do CT chest to better define this.  Figured since were doing that minus we will do CT angio to rule out pulmonary embolus because of the Fitbit showing tachycardia.  Our cardiac monitor here showing tachycardia.  In addition patient's blood pressure is elevated here systolic 505 diastolics okay at 82.  Patient is not on any antihypertensive meds.  May require close follow-up for this with her primary care doctor.  Patient had a brief episode of some left below the breast chest pain lasting less than 2 minutes.  And that has resolved.  This was close to the time of discharge.  Patient given precautions about any recurrence of chest pain lasting 20 minutes or longer.  She had not any chest pain earlier in the day.   Final Clinical Impression(s) / ED Diagnoses Final diagnoses:  Palpitations  Hypertension, unspecified type    Rx / DC Orders ED Discharge Orders     None         Fredia Sorrow, MD 07/18/21 2150

## 2021-07-18 NOTE — ED Notes (Signed)
EMT-P provided AVS using Teachback Method. Patient verbalizes understanding of Discharge Instructions. Opportunity for Questioning and Answers were provided by EMT-P. Patient Discharged from ED.  ? ?

## 2021-07-18 NOTE — ED Notes (Signed)
ED Provider at bedside. 

## 2021-07-18 NOTE — Discharge Instructions (Signed)
Call cardiology for follow-up for the palpitations.  Return to get seen for any chest pain that last 20 minutes or longer.  Follow-up with your primary care doctor regarding the borderline blood pressures.  Trending important.  To determine if you need treatment for hypertension.  No evidence of any arrhythmias here today.  Return for any palpitations heart rate up in the 130s with or greater lasting 40 minutes or longer.

## 2021-07-18 NOTE — ED Triage Notes (Signed)
Pt came in POV c/o of having alterations in increasing BP and HR. Pt states this has been going on for a while but here recently she feels it is higher than usual. Just concerned to why it is higher than her normal??

## 2021-07-22 ENCOUNTER — Ambulatory Visit (INDEPENDENT_AMBULATORY_CARE_PROVIDER_SITE_OTHER): Payer: 59 | Admitting: Family Medicine

## 2021-07-22 ENCOUNTER — Encounter: Payer: Self-pay | Admitting: Family Medicine

## 2021-07-22 VITALS — BP 134/80 | HR 89 | Temp 97.7°F | Wt 167.0 lb

## 2021-07-22 DIAGNOSIS — F900 Attention-deficit hyperactivity disorder, predominantly inattentive type: Secondary | ICD-10-CM | POA: Diagnosis not present

## 2021-07-22 DIAGNOSIS — R000 Tachycardia, unspecified: Secondary | ICD-10-CM

## 2021-07-22 MED ORDER — LISDEXAMFETAMINE DIMESYLATE 30 MG PO CAPS: 30.0000 mg | ORAL_CAPSULE | Freq: Every day | ORAL | 0 refills | Status: DC

## 2021-07-22 NOTE — Progress Notes (Signed)
° °  Subjective:    Patient ID: Kristina Pearson, female    DOB: 05-Aug-1955, 66 y.o.   MRN: [id number]  HPI Here to follow up an ED visit on 07-18-21 for rapid heart rates. Of note on 07-16-21 she had a steroid injection into the right hip per Dr. Nelva Bush, and this was very helpful to relieve hip pain. However she immediately began to notice her heart rate going up, and on 07-18-21 her Fit Bit registered a pulse of 140. She also felt slightly SOB, but she did not have chest pain. At the ED her EKG showed sinus tachycardia. Her labs were remarkable for an elevated WBC to 14.1 and a slightly low potassium of 3.4. Her CXR was clear, and a chest CT angiogram was normal. It was felt that what she was experiencing was a reaction to the steroid injection, and in fact all her symptoms have resolved over the past few days. Her BP and pulse are now normal , and she feels fine. There was some concern that her Vyvanse could have been contributing to this episode. I do not think this was a factor, but she nonetheless wants to decrease her dose.    Review of Systems  Constitutional: Negative.   Respiratory:  Positive for shortness of breath.   Cardiovascular:  Positive for palpitations. Negative for chest pain and leg swelling.      Objective:   Physical Exam Constitutional:      Appearance: Normal appearance.  Cardiovascular:     Rate and Rhythm: Normal rate and regular rhythm.     Pulses: Normal pulses.     Heart sounds: Normal heart sounds.  Pulmonary:     Effort: Pulmonary effort is normal.     Breath sounds: Normal breath sounds.  Neurological:     General: No focal deficit present.     Mental Status: She is alert and oriented to person, place, and time.          Assessment & Plan:  She had an episode of tachycardia and mild SOB which appears to have been a reaction to the steroid injection she had received 2 days before. These symptoms have all resolved now an she she feels fine. However at her  request we will reduce the Vyvanse dose to 30 mg daily. We spent a total of (34   ) minutes reviewing records and discussing these issues.  [name], MD

## 2021-07-23 ENCOUNTER — Telehealth: Payer: Self-pay | Admitting: Family Medicine

## 2021-07-23 DIAGNOSIS — M1990 Unspecified osteoarthritis, unspecified site: Secondary | ICD-10-CM

## 2021-07-23 NOTE — Telephone Encounter (Signed)
Please advise if ok to send Alternative Rx to pt pharmacy

## 2021-07-23 NOTE — Telephone Encounter (Signed)
[place] [facility] is calling and the celebrex is not covered on her insurance however  the meloxicam and naproxen is covered on her insurance. Please advise.

## 2021-07-23 NOTE — Telephone Encounter (Signed)
Stop the Celebrex and call in Meloxicam 15 mg daily, #90 with 3 rf

## 2021-07-23 NOTE — Telephone Encounter (Signed)
Please advise 

## 2021-07-24 MED ORDER — MELOXICAM 15 MG PO TABS: 15.0000 mg | ORAL_TABLET | Freq: Every day | ORAL | 3 refills | Status: DC

## 2021-07-24 NOTE — Progress Notes (Signed)
Patient Care Team: Laurey Morale, MD as PCP - General  DIAGNOSIS:    ICD-10-CM   1. Malignant neoplasm of upper-outer quadrant of right breast in female, estrogen receptor positive ([place])  C50.411    Z17.0       SUMMARY OF ONCOLOGIC HISTORY: Oncology History  Breast cancer of upper-outer quadrant of right female breast ([place])  08/15/2013 Initial Diagnosis   DCIS with calcifications   08/30/2013 Surgery   Right breast lumpectomy: No evidence of residual DCIS found to have atypical lobular hyperplasia   09/26/2013 - 11/09/2013 Radiation Therapy   Adjuvant radiation therapy by Dr. Valere Dross   11/21/2013 - 08/22/2015 Anti-estrogen oral therapy   Tamoxifen 20 mg daily stopped because patient was having problems with lack of interest in life which she attributed to tamoxifen.   10/01/2019 Genetic Testing   PTEN c.1115A>G (p.Asn372Ser) VUS, but otherwise Negative genetic testing on the multicancer gene and preliminary evidence colorectal cancer gene panel.  The Multi-Gene Panel offered by [facility] includes sequencing and/or deletion duplication testing of the following 91 genes: AIP, ALK, APC, ATM, AXIN2,BAP1,  BARD1, BLM, BMPR1A, BRCA1, BRCA2, BRIP1, BUB1B, CASR, CDC73, CDH1, CDK4, CDKN1B, CDKN1C, CDKN2A (p14ARF), CDKN2A (p16INK4a), CEBPA, CFP57, CHEK2, CTNNA1, DICER1, DIS3L2, EGFR (c.2369C>T, p.Thr790Met variant only), EPCAM (Deletion/duplication testing only), ENG, FH, FLCN, GALANT12, GATA2, GPC3, GREM1 (Promoter region deletion/duplication testing only), HOXB13 (c.251G>A, p.Gly84Glu), HRAS, KIT, MAX, MEN1, MET, MITF (c.952G>A, p.Glu318Lys variant only), MLH1, MSH2, MSH3, MSH6, MUTYH, NBN, NF1, NF2, NTHL1, PALB2, PDGFRA, PHOX2B, PMS2, POLD1, POLE, POT1, PRKAR1A, PTCH1, PTEN, RAD50, RAD51C, RAD51D, RB1, RECQL4, RET, RNF43, RSP20, RUNX1, SDHAF2, SDHA (sequence changes only), SDHB, SDHC, SDHD, SMAD4, SMARCA4, SMARCB1, SMARCE1, STK11, SUFU, TERC, TERT, TMEM127, TP53, TSC1, TSC2, VHL, WRN and WT1. The report  date is 10/01/2019.     CHIEF COMPLIANT: Follow-up and surveillance of breast cancer  INTERVAL HISTORY: Kristina Pearson is a 66 y.o. with above-mentioned history of right breast DCIS who underwent lumpectomy radiation is currently off tamoxifen. Mammogram on 05/29/2021 showed no evidence of malignancy. She presents to the clinic today for follow-up.   ALLERGIES:  has No Known Allergies.  MEDICATIONS:  Current Outpatient Medications  Medication Sig Dispense Refill   ALPRAZolam (XANAX) 0.5 MG tablet TAKE 1 TABLET BY MOUTH 2 TIMES DAILY AS NEEDED FOR ANXIETY. 60 tablet 5   aspirin EC 81 MG tablet Take 1 tablet (81 mg total) by mouth daily. 1 tablet 0   celecoxib (CELEBREX) 200 MG capsule Take 1 capsule (200 mg total) by mouth daily. 90 capsule 3   cycloSPORINE (RESTASIS) 0.05 % ophthalmic emulsion Place 1 drop into both eyes 2 (two) times daily.     estradiol (ESTRACE VAGINAL) 0.1 MG/GM vaginal cream Place 1 Applicatorful vaginally 3 (three) times a week. 42.5 g 12   levothyroxine (SYNTHROID, LEVOTHROID) 112 MCG tablet Take 112 mcg by mouth daily before breakfast.     [START ON 09/22/2021] lisdexamfetamine (VYVANSE) 30 MG capsule Take 1 capsule (30 mg total) by mouth daily. 30 capsule 0   Magnesium 400 MG CAPS Take by mouth daily.     meclizine (ANTIVERT) 25 MG tablet Take 1 tablet (25 mg total) by mouth every 4 (four) hours as needed for dizziness or nausea. 60 tablet 3   meloxicam (MOBIC) 15 MG tablet Take 1 tablet (15 mg total) by mouth daily. 90 tablet 3   Multiple Vitamin (MULTIVITAMIN) tablet Take 1 tablet by mouth daily. One a day for over age 95  temazepam (RESTORIL) 30 MG capsule TAKE 1 CAPSULE (30 MG TOTAL) BY MOUTH EVERY DAY AT BEDTIME AS NEEDED FOR SLEEP. 30 capsule 5   traMADol (ULTRAM) 50 MG tablet TAKE 2 TABLETS (100 MG TOTAL) BY MOUTH EVERY 6 (SIX) HOURS AS NEEDED FOR MODERATE PAIN. 120 tablet 5   UNABLE TO FIND CBD oil 20 mg - half a dropper at bedtime for pain or scalp psoriases      No current facility-administered medications for this visit.    PHYSICAL EXAMINATION: ECOG PERFORMANCE STATUS: 1 - Symptomatic but completely ambulatory  Vitals:   07/25/21 1049  BP: 136/76  Pulse: 86  Resp: 18  Temp: (!) 97 F (36.1 C)  SpO2: 100%   Filed Weights   07/25/21 1049  Weight: 169 lb 4.8 oz (76.8 kg)    BREAST: No palpable masses or nodules in either right or left breasts. No palpable axillary supraclavicular or infraclavicular adenopathy no breast tenderness or nipple discharge. (exam performed in the presence of a chaperone)  LABORATORY DATA:  I have reviewed the data as listed CMP Latest Ref Rng & Units 07/18/2021 10/17/2020 09/18/2019  Glucose 70 - 99 mg/dL 111(H) 88 101(H)  BUN 8 - 23 mg/dL _0 Creatinine 0.44 - 1.00 mg/dL 0.91 1.04 0.95  Sodium 135 - 145 mmol/L 134(L) 140 137  Potassium 3.5 - 5.1 mmol/L 3.4(L) 4.9 4.5  Chloride 98 - 111 mmol/L 99 103 100  CO2 22 - 32 mmol/L _1 Calcium 8.9 - 10.3 mg/dL 9.7 9.9 9.6  Total Protein 6.0 - 8.3 g/dL - 6.8 6.5  Total Bilirubin 0.2 - 1.2 mg/dL - 0.5 0.4  Alkaline Phos 39 - 117 U/L - 54 62  AST 0 - 37 U/L - 18 22  ALT 0 - 35 U/L - 13 16    Lab Results  Component Value Date   WBC 14.1 (H) 07/18/2021   HGB 12.5 07/18/2021   HCT 38.2 07/18/2021   MCV 93.6 07/18/2021   PLT 370 07/18/2021   NEUTROABS 11.9 (H) 07/18/2021    ASSESSMENT & PLAN:  Breast cancer of upper-outer quadrant of right female breast ([place]) Right breast DCIS ER/PR positive: Patient is currently on antiestrogen therapy with tamoxifen  June 2015 -March 2017 stopped early due to lack of energy and decreased interest in life or activities   Surveillance: 1.  Mammogram 05/30/2021: Benign breast density category B 2 breast exam:  07/25/2021.  Benign   CT angiogram chest 07/18/2021: No evidence of PE, opacity right base is not found on this scan.   Family history of multiple myeloma: We will check a serum protein electrophoresis  today to screen her for it Return to clinic in 1 year for follow-up with Mendel Ryder for long-term survivorship.    No orders of the defined types were placed in this encounter.  The patient has a good understanding of the overall plan. she agrees with it. she will call with any problems that may develop before the next visit here.  Total time spent: 20 mins including face to face time and time spent for planning, charting and coordination of care  Rulon Eisenmenger, MD, MPH 07/25/2021  I, Thana Ates, am acting as scribe for Dr. Nicholas Lose.  I have reviewed the above documentation for accuracy and completeness, and I agree with the above.

## 2021-07-24 NOTE — Assessment & Plan Note (Signed)
Right breast DCIS ER/PR positive: Patient is currently on antiestrogen therapy with tamoxifen  June 2015 -March 2017 stopped early due to lack of energy and decreased interest in life or activities  Surveillance: 1.  Mammogram 05/30/2021: Benign breast density category B 2 breast exam: Bilateral breast tenderness 07/25/2021.    CT angiogram chest 07/18/2021: No evidence of PE, opacity right base is not found on this scan.    Return to clinic in 1 year for follow-up

## 2021-07-24 NOTE — Telephone Encounter (Addendum)
Celebrex canceled, Meloxican 15mg  sent to [facility].

## 2021-07-25 ENCOUNTER — Inpatient Hospital Stay: Payer: 59

## 2021-07-25 ENCOUNTER — Other Ambulatory Visit: Payer: Self-pay

## 2021-07-25 ENCOUNTER — Inpatient Hospital Stay: Payer: 59 | Attending: Hematology and Oncology | Admitting: Hematology and Oncology

## 2021-07-25 VITALS — BP 136/76 | HR 86 | Temp 97.0°F | Resp 18 | Ht 66.0 in | Wt 169.3 lb

## 2021-07-25 DIAGNOSIS — Z86000 Personal history of in-situ neoplasm of breast: Secondary | ICD-10-CM | POA: Insufficient documentation

## 2021-07-25 DIAGNOSIS — Z170 Estrogen receptor positive status [ER+]: Secondary | ICD-10-CM | POA: Diagnosis not present

## 2021-07-25 DIAGNOSIS — Z923 Personal history of irradiation: Secondary | ICD-10-CM | POA: Diagnosis not present

## 2021-07-25 DIAGNOSIS — Z807 Family history of other malignant neoplasms of lymphoid, hematopoietic and related tissues: Secondary | ICD-10-CM | POA: Diagnosis not present

## 2021-07-25 DIAGNOSIS — C50411 Malignant neoplasm of upper-outer quadrant of right female breast: Secondary | ICD-10-CM | POA: Diagnosis not present

## 2021-07-28 ENCOUNTER — Telehealth: Payer: Self-pay | Admitting: Hematology and Oncology

## 2021-07-28 NOTE — Telephone Encounter (Signed)
Scheduled appointment per 2/3 los. Unable to leave a message due to mailbox being full. Patient will be mailed an updated calendar.

## 2021-07-29 LAB — MULTIPLE MYELOMA PANEL, SERUM
Albumin SerPl Elph-Mcnc: 4.1 g/dL (ref 2.9–4.4)
Albumin/Glob SerPl: 1.4 (ref 0.7–1.7)
Alpha 1: 0.2 g/dL (ref 0.0–0.4)
Alpha2 Glob SerPl Elph-Mcnc: 0.7 g/dL (ref 0.4–1.0)
B-Globulin SerPl Elph-Mcnc: 1 g/dL (ref 0.7–1.3)
Gamma Glob SerPl Elph-Mcnc: 1 g/dL (ref 0.4–1.8)
Globulin, Total: 3 g/dL (ref 2.2–3.9)
IgA: 196 mg/dL (ref 87–352)
IgG (Immunoglobin G), Serum: 1047 mg/dL (ref 586–1602)
IgM (Immunoglobulin M), Srm: 71 mg/dL (ref 26–217)
Total Protein ELP: 7.1 g/dL (ref 6.0–8.5)

## 2021-08-01 ENCOUNTER — Ambulatory Visit (INDEPENDENT_AMBULATORY_CARE_PROVIDER_SITE_OTHER): Payer: 59

## 2021-08-01 ENCOUNTER — Ambulatory Visit (INDEPENDENT_AMBULATORY_CARE_PROVIDER_SITE_OTHER): Payer: 59 | Admitting: Cardiology

## 2021-08-01 ENCOUNTER — Encounter (HOSPITAL_BASED_OUTPATIENT_CLINIC_OR_DEPARTMENT_OTHER): Payer: Self-pay | Admitting: Cardiology

## 2021-08-01 ENCOUNTER — Other Ambulatory Visit: Payer: Self-pay

## 2021-08-01 VITALS — BP 140/90 | HR 110 | Ht 66.0 in | Wt 170.0 lb

## 2021-08-01 DIAGNOSIS — R002 Palpitations: Secondary | ICD-10-CM

## 2021-08-01 DIAGNOSIS — R030 Elevated blood-pressure reading, without diagnosis of hypertension: Secondary | ICD-10-CM | POA: Diagnosis not present

## 2021-08-01 DIAGNOSIS — R000 Tachycardia, unspecified: Secondary | ICD-10-CM | POA: Diagnosis not present

## 2021-08-01 DIAGNOSIS — Z7189 Other specified counseling: Secondary | ICD-10-CM | POA: Diagnosis not present
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
## 2021-08-01 NOTE — Patient Instructions (Signed)
Medication Instructions:  Your Physician recommend you continue on your current medication as directed.    *If you need a refill on your cardiac medications before your next appointment, please call your pharmacy*   Lab Work: None ordered today   Testing/Procedures: Your physician has recommended that you wear a Zio monitor.   This monitor is a medical device that records the hearts electrical activity. Doctors most often use these monitors to diagnose arrhythmias. Arrhythmias are problems with the speed or rhythm of the heartbeat. The monitor is a small device applied to your chest. You can wear one while you do your normal daily activities. While wearing this monitor if you have any symptoms to push the button and record what you felt. Once you have worn this monitor for the period of time provider prescribed (Usually 14 days), you will return the monitor device in the postage paid box. Once it is returned they will download the data collected and provide [place] with a report which the provider will then review and we will call you with those results. Important tips:  Avoid showering during the first 24 hours of wearing the monitor. Avoid excessive sweating to help maximize wear time. Do not submerge the device, no hot tubs, and no swimming pools. Keep any lotions or oils away from the patch. After 24 hours you may shower with the patch on. Take brief showers with your back facing the shower head.  Do not remove patch once it has been placed because that will interrupt data and decrease adhesive wear time. Push the button when you have any symptoms and write down what you were feeling. Once you have completed wearing your monitor, remove and place into box which has postage paid and place in your outgoing mailbox.  If for some reason you have misplaced your box then call our office and we can provide another box and/or mail it off for you.      Follow-Up: At [facility], you and your  health needs are our priority.  As part of our continuing mission to provide you with exceptional heart care, we have created designated Provider Care Teams.  These Care Teams include your primary Cardiologist (physician) and Advanced Practice Providers (APPs -  Physician Assistants and Nurse Practitioners) who all work together to provide you with the care you need, when you need it.  We recommend signing up for the patient portal called "MyChart".  Sign up information is provided on this After Visit Summary.  MyChart is used to connect with patients for Virtual Visits (Telemedicine).  Patients are able to view lab/test results, encounter notes, upcoming appointments, etc.  Non-urgent messages can be sent to your provider as well.   To learn more about what you can do with MyChart, go to [email or web address].    Your next appointment:   Based on test result   The format for your next appointment:   In Person  Provider:   Buford Dresser, MD

## 2021-08-07 ENCOUNTER — Encounter (HOSPITAL_BASED_OUTPATIENT_CLINIC_OR_DEPARTMENT_OTHER): Payer: Self-pay

## 2021-08-08 ENCOUNTER — Other Ambulatory Visit: Payer: Self-pay

## 2021-08-08 ENCOUNTER — Ambulatory Visit (INDEPENDENT_AMBULATORY_CARE_PROVIDER_SITE_OTHER): Payer: 59 | Admitting: Family Medicine

## 2021-08-08 VITALS — BP 120/78 | HR 108 | Temp 98.5°F | Ht 66.0 in | Wt 170.2 lb

## 2021-08-08 DIAGNOSIS — R1011 Right upper quadrant pain: Secondary | ICD-10-CM

## 2021-08-08 DIAGNOSIS — N3289 Other specified disorders of bladder: Secondary | ICD-10-CM | POA: Diagnosis not present

## 2021-08-08 DIAGNOSIS — R109 Unspecified abdominal pain: Secondary | ICD-10-CM | POA: Diagnosis not present

## 2021-08-08 DIAGNOSIS — R300 Dysuria: Secondary | ICD-10-CM

## 2021-08-08 MED ORDER — MELOXICAM 15 MG PO TABS: 15.0000 mg | ORAL_TABLET | Freq: Every day | ORAL | 3 refills | Status: DC

## 2021-08-08 MED ORDER — AMOXICILLIN 500 MG PO TABS: 500.0000 mg | ORAL_TABLET | Freq: Two times a day (BID) | ORAL | 0 refills | Status: AC

## 2021-08-08 NOTE — Progress Notes (Signed)
Subjective:    Patient ID: Kristina Pearson, female    DOB: Aug 19, 1955, 66 y.o.   MRN: [id number]  Chief Complaint  Patient presents with   Urinary Tract Infection    HPI Patient was seen today for acute concern.  Patient endorses dysuria, bladder spasms, suprapubic tenderness times a few days.  Patient also noticed right flank pain and incomplete bladder emptying.  Tried to increase p.o. intake of water.  Took OTC urinary supplement x2, tramadol, and muscle relaxer.  Past Medical History:  Diagnosis Date   ADHD    ADHD (attention deficit hyperactivity disorder)    ADD   Allergy    Anxiety    Arthritis    osteoarthritrs   Breast cancer ([place]) 08/15/13   right lateral upper outer   Depression    Family history of breast cancer    Family history of colon cancer    Family history of melanoma    Family history of ovarian cancer    Fibromyalgia    GERD (gastroesophageal reflux disease)    TUMS   Headache(784.0)    sees Dr. Gwendel Hanson at [facility]    History of depression    Hx of radiation therapy 10/19/13- 11/09/13   right breast 4256 cGy in 16 sessions, hypo-fractionated   Hypothyroid    Migraine    Osteopenia    Personal history of radiation therapy 2015   PONV (postoperative nausea and vomiting)    Sialoadenitis    Spinal stenosis    Urinary incontinence    Vitiligo    Wears glasses     No Known Allergies  ROS General: Denies fever, chills, night sweats, changes in weight, changes in appetite HEENT: Denies headaches, ear pain, changes in vision, rhinorrhea, sore throat CV: Denies CP, palpitations, SOB, orthopnea Pulm: Denies SOB, cough, wheezing GI: Denies abdominal pain, nausea, vomiting, diarrhea, constipation GU: Denies hematuria, frequency, vaginal discharge + dysuria, bladder spasms, incomplete bladder emptying Msk: Denies muscle cramps, joint pains Neuro: Denies weakness, numbness, tingling Skin: Denies rashes, bruising Psych: Denies depression,  anxiety, hallucinations     Objective:    Blood pressure 120/78, pulse (!) 108, temperature 98.5 F (36.9 C), temperature source Oral, height 5\' 6"  (1.676 m), weight 170 lb 3.2 oz (77.2 kg), last menstrual period 09/21/2006, SpO2 99 %.   Gen. Pleasant, well-nourished, in no distress, normal affect   HEENT: [place]/AT, face symmetric, conjunctiva clear, no scleral icterus, PERRLA, EOMI, nares patent without drainage Lungs: no accessory muscle use, CTAB, no wheezes or rales Cardiovascular: RRR, no m/r/g, no peripheral edema Abdomen: BS present, soft, RUQ and suprapubic tenderness, ND, no hepatosplenomegaly.  Right CVA tenderness.  Right flank tenderness. Musculoskeletal: No deformities, no cyanosis or clubbing, normal tone Neuro:  A&Ox3, CN II-XII intact, normal gait Skin:  Warm, no lesions/ rash   Wt Readings from Last 3 Encounters:  08/08/21 170 lb 3.2 oz (77.2 kg)  08/01/21 170 lb (77.1 kg)  07/25/21 169 lb 4.8 oz (76.8 kg)    Lab Results  Component Value Date   WBC 14.1 (H) 07/18/2021   HGB 12.5 07/18/2021   HCT 38.2 07/18/2021   PLT 370 07/18/2021   GLUCOSE 111 (H) 07/18/2021   CHOL 198 10/17/2020   TRIG 102.0 10/17/2020   HDL 86.60 10/17/2020   LDLDIRECT 119.9 05/15/2009   LDLCALC 91 10/17/2020   ALT 13 10/17/2020   AST 18 10/17/2020   NA 134 (L) 07/18/2021   K 3.4 (L) 07/18/2021   CL  99 07/18/2021   CREATININE 0.91 07/18/2021   BUN 21 07/18/2021   CO2 25 07/18/2021   TSH 0.776 07/18/2021   HGBA1C 5.1 10/17/2020    Assessment/Plan:  Dysuria  -discussed possible causes including UTI -will start abx while awaiting cx result -increase po intake of fluids. - Plan: Culture, Urine, amoxicillin (AMOXIL) 500 MG tablet  Bladder spasm  RUQ pain  -noted on exam -will obtain labs to evaluate for gallbladder dysfunction.  Could also be referred pain. - Plan: CMP, CBC with Differential/Platelet  Flank pain -Discussed possible causes including muscle strain, renal  calculi, RUQ referred pain -Patient vies to increase p.o. intake of water and fluids -We will obtain labs -For continued or worsening symptoms imaging to evaluate for renal calculi.   F/u prn  Grier Mitts, MD

## 2021-08-08 NOTE — Addendum Note (Signed)
Addended by: Rosalyn Gess D on: 08/08/2021 03:44 PM   Modules accepted: Orders

## 2021-08-10 ENCOUNTER — Encounter: Payer: Self-pay | Admitting: Family Medicine

## 2021-08-10 LAB — URINE CULTURE
MICRO NUMBER:: 13024419
SPECIMEN QUALITY:: ADEQUATE

## 2021-08-11 ENCOUNTER — Ambulatory Visit (INDEPENDENT_AMBULATORY_CARE_PROVIDER_SITE_OTHER): Payer: 59 | Admitting: Family Medicine

## 2021-08-11 ENCOUNTER — Encounter: Payer: Self-pay | Admitting: Family Medicine

## 2021-08-11 ENCOUNTER — Other Ambulatory Visit: Payer: 59

## 2021-08-11 VITALS — BP 128/82 | HR 110 | Temp 98.6°F | Wt 165.0 lb

## 2021-08-11 DIAGNOSIS — R300 Dysuria: Secondary | ICD-10-CM

## 2021-08-11 DIAGNOSIS — N390 Urinary tract infection, site not specified: Secondary | ICD-10-CM | POA: Diagnosis not present

## 2021-08-11 LAB — POC URINALSYSI DIPSTICK (AUTOMATED)
Bilirubin, UA: NEGATIVE
Blood, UA: NEGATIVE
Glucose, UA: NEGATIVE
Ketones, UA: NEGATIVE
Leukocytes, UA: NEGATIVE
Nitrite, UA: NEGATIVE
Protein, UA: NEGATIVE
Spec Grav, UA: 1.02 (ref 1.010–1.025)
Urobilinogen, UA: 0.2 E.U./dL
pH, UA: 6 (ref 5.0–8.0)

## 2021-08-11 MED ORDER — CIPROFLOXACIN HCL 500 MG PO TABS: 500.0000 mg | ORAL_TABLET | Freq: Two times a day (BID) | ORAL | 0 refills | Status: DC

## 2021-08-11 NOTE — Progress Notes (Signed)
° °  Subjective:    Patient ID: Kristina Pearson, female    DOB: 21-Sep-1955, 66 y.o.   MRN: [id number]  HPI Here for continued urinary symptoms. She was seen here on 08-08-21 for right flank pain, urinary urgency and frequency, and burning. No fever. She was started on Amoxicillin. Today she feels somewhat better, but the urgency and lower abdominal pressure remains. The culture from that day grew Citrobacter koseri that was pan sensitive.    Review of Systems  Constitutional: Negative.   Respiratory: Negative.    Cardiovascular: Negative.   Gastrointestinal:  Positive for abdominal pain.  Genitourinary:  Positive for dysuria, frequency and urgency. Negative for hematuria.      Objective:   Physical Exam Constitutional:      Appearance: Normal appearance. She is not ill-appearing.  Cardiovascular:     Rate and Rhythm: Normal rate and regular rhythm.     Pulses: Normal pulses.     Heart sounds: Normal heart sounds.  Pulmonary:     Effort: Pulmonary effort is normal.     Breath sounds: Normal breath sounds.  Abdominal:     General: Abdomen is flat. Bowel sounds are normal. There is no distension.     Palpations: Abdomen is soft.     Tenderness: There is no guarding or rebound.     Comments: Mild suprapubic tenderness   Neurological:     Mental Status: She is alert.          Assessment & Plan:  Partially treated UTI. We will stop the Amoxicillin and give her 10 days of Cipro. Recheck as needed.  [name], MD

## 2021-08-11 NOTE — Telephone Encounter (Signed)
She is seeing me this afternoon. We will talk about it then

## 2021-08-13 ENCOUNTER — Other Ambulatory Visit: Payer: Self-pay | Admitting: Family Medicine

## 2021-08-13 NOTE — Telephone Encounter (Signed)
Last OV- 08/11/21 Last refill- 02/17/2021--30 capsules, 5 refills  No future OV scheduled.  Can this patient receive a refill?

## 2021-08-20 ENCOUNTER — Encounter (HOSPITAL_BASED_OUTPATIENT_CLINIC_OR_DEPARTMENT_OTHER): Payer: Self-pay

## 2021-08-25 ENCOUNTER — Encounter: Payer: Self-pay | Admitting: Family Medicine

## 2021-08-25 ENCOUNTER — Other Ambulatory Visit: Payer: Self-pay

## 2021-08-25 DIAGNOSIS — N390 Urinary tract infection, site not specified: Secondary | ICD-10-CM

## 2021-08-25 MED ORDER — SULFAMETHOXAZOLE-TRIMETHOPRIM 800-160 MG PO TABS: 1.0000 | ORAL_TABLET | Freq: Two times a day (BID) | ORAL | 0 refills | Status: DC

## 2021-08-25 NOTE — Telephone Encounter (Signed)
Rx sent to pt pharmacy per Dr Sarajane Jews, pt is aware ?

## 2021-08-25 NOTE — Telephone Encounter (Signed)
Rather than more Cipro, I will send in a different antibiotic. Call in Bactrim DS BID for 10 days  ?

## 2021-08-28 ENCOUNTER — Ambulatory Visit: Payer: 59 | Admitting: Physical Therapy

## 2021-09-01 ENCOUNTER — Other Ambulatory Visit: Payer: Self-pay

## 2021-09-01 ENCOUNTER — Ambulatory Visit (INDEPENDENT_AMBULATORY_CARE_PROVIDER_SITE_OTHER): Payer: 59 | Admitting: Family Medicine

## 2021-09-01 ENCOUNTER — Encounter: Payer: Self-pay | Admitting: Family Medicine

## 2021-09-01 VITALS — BP 120/88 | HR 101 | Temp 98.5°F | Wt 168.1 lb

## 2021-09-01 DIAGNOSIS — G43909 Migraine, unspecified, not intractable, without status migrainosus: Secondary | ICD-10-CM | POA: Insufficient documentation

## 2021-09-01 MED ORDER — SUMATRIPTAN SUCCINATE 100 MG PO TABS: 100.0000 mg | ORAL_TABLET | ORAL | 11 refills | Status: AC | PRN

## 2021-09-01 NOTE — Progress Notes (Signed)
? ?  Subjective:  ? ? Patient ID: Kristina Pearson, female    DOB: 09-Jun-1956, 66 y.o.   MRN: [id number] ? ?HPI ?Here to discuss migraine headaches. She has had these for many years, though she has never discussed them with me before. These are classic severe throbbing headaches that make her nauseated and sensitive to light and sounds. She had one this past weekend, and she laid in bed with the lights out until it passed. She has never tried a triptan apparently.  ? ? ?Review of Systems  ?Constitutional: Negative.   ?Respiratory: Negative.    ?Cardiovascular: Negative.   ?Neurological:  Positive for headaches.  ? ?   ?Objective:  ? Physical Exam ?Constitutional:   ?   Appearance: Normal appearance. She is not ill-appearing.  ?HENT:  ?   Head: Normocephalic and atraumatic.  ?Eyes:  ?   Extraocular Movements: Extraocular movements intact.  ?   Pupils: Pupils are equal, round, and reactive to light.  ?Cardiovascular:  ?   Rate and Rhythm: Normal rate and regular rhythm.  ?   Pulses: Normal pulses.  ?   Heart sounds: Normal heart sounds.  ?Pulmonary:  ?   Effort: Pulmonary effort is normal.  ?   Breath sounds: Normal breath sounds.  ?Neurological:  ?   General: No focal deficit present.  ?   Mental Status: She is alert and oriented to person, place, and time.  ? ? ? ? ? ?   ?Assessment & Plan:  ?Migraines. We will let her try Sumatriptan 100 mg the next time she feels one starting. Recheck as needed.  ?[name], MD ? ? ?

## 2021-09-02 ENCOUNTER — Encounter (HOSPITAL_BASED_OUTPATIENT_CLINIC_OR_DEPARTMENT_OTHER): Payer: Self-pay

## 2021-09-05 ENCOUNTER — Encounter: Payer: Self-pay | Admitting: Family Medicine

## 2021-09-18 ENCOUNTER — Telehealth: Payer: Self-pay | Admitting: Family Medicine

## 2021-09-18 NOTE — Telephone Encounter (Signed)
Pt LOV was on 09/01/2021 ?Pt last refill was done on 9/282022 ?[place] and requests for refill ?Please advise ?

## 2021-09-18 NOTE — Telephone Encounter (Signed)
Patient called because her refill at [facility] for ALPRAZolam Duanne Moron) 0.5 MG tablet has expired. [facility] [place] at [facility], visiting her [place]. ? ? ? ?[facility] [address], [place] [address] ? ?Phone number is  [phone] ? ? ? ? ? ?Please advise  ? ? ? ? ? ?

## 2021-09-19 ENCOUNTER — Encounter (HOSPITAL_BASED_OUTPATIENT_CLINIC_OR_DEPARTMENT_OTHER): Payer: Self-pay

## 2021-09-19 MED ORDER — ALPRAZOLAM 0.5 MG PO TABS: 0.5000 mg | ORAL_TABLET | Freq: Two times a day (BID) | ORAL | 0 refills | Status: DC | PRN

## 2021-09-19 NOTE — Telephone Encounter (Signed)
Sent in for #[phone]  ?

## 2021-09-19 NOTE — Telephone Encounter (Signed)
Pt notified via My Chart

## 2021-09-30 ENCOUNTER — Telehealth: Payer: Self-pay | Admitting: *Deleted

## 2021-09-30 NOTE — Telephone Encounter (Signed)
Reviewing the chart I believe this procedure is for hip. I will confirm with the office tomorrow when the re-open  ?

## 2021-09-30 NOTE — Telephone Encounter (Signed)
? ?  Pre-operative Risk Assessment  ?  ?Patient Name: Kristina Pearson  ?DOB: [birth year] ?MRN: [id number]  ? ?  ? ?Request for Surgical Clearance   ? ?Procedure:   Right total  arthoplasty  ? ?Date of Surgery:  Clearance 11/24/21                              ?   ?Surgeon:  Dr Paralee Cancel  ?Surgeon's Group or Practice Name:  [facility] ?Phone number:   [phone]  attn Orson Slick ?Fax number:  [phone]   ?  ?Type of Clearance Requested:   ?- Medical  ?  ?Type of Anesthesia:  Spinal ?  ?Additional requests/questions:     ? ?Signed, ?Trixie Dredge V   ?09/30/2021, 2:48 PM   ?

## 2021-10-01 NOTE — Telephone Encounter (Signed)
I was able to confirm with Sherri at Dr. Aurea Graff office that the surgery is a Right Total Hip Arthroplasty  ?

## 2021-10-01 NOTE — Telephone Encounter (Signed)
I tried to reach the pt on her preferred # though vm is full could not leave message for call back. I called alternate # listed. Left message to call the office so that we may schedule a tele pre op appt.  ?

## 2021-10-01 NOTE — Telephone Encounter (Signed)
Primary Cardiologist:Bridgette Harrell Gave, MD ? ?Chart reviewed as part of pre-operative protocol coverage. Because of Kristina Pearson's past medical history and time since last visit, he/she will require a virtual visit/telephone call in order to better assess preoperative cardiovascular risk. ? ?Pre-op covering staff: ?- Please contact patient, obtain consent, and schedule appointment  ? ?Emmaline Life, NP-C ? ?  ?10/01/2021, 8:30 AM ?[facility] ?[address] ?Office [phone] Fax [phone] ? ?

## 2021-10-02 NOTE — Telephone Encounter (Signed)
Pt has been scheduled to see Dr. Harrell Gave, 10/23/21, in office, per pt's request.  Clearance will be addressed at that time. ? ?Will route back to the requesting surgeon's office to make them aware.  ?

## 2021-10-16 ENCOUNTER — Other Ambulatory Visit: Payer: Self-pay | Admitting: Family Medicine

## 2021-10-23 ENCOUNTER — Encounter (HOSPITAL_BASED_OUTPATIENT_CLINIC_OR_DEPARTMENT_OTHER): Payer: Self-pay | Admitting: Cardiology

## 2021-10-23 ENCOUNTER — Ambulatory Visit (INDEPENDENT_AMBULATORY_CARE_PROVIDER_SITE_OTHER): Payer: 59 | Admitting: Cardiology

## 2021-10-23 VITALS — BP 110/74 | HR 96 | Ht 66.0 in | Wt 173.5 lb

## 2021-10-23 DIAGNOSIS — R000 Tachycardia, unspecified: Secondary | ICD-10-CM

## 2021-10-23 DIAGNOSIS — Z712 Person consulting for explanation of examination or test findings: Secondary | ICD-10-CM | POA: Diagnosis not present

## 2021-10-23 DIAGNOSIS — Z7189 Other specified counseling: Secondary | ICD-10-CM | POA: Diagnosis not present

## 2021-10-23 DIAGNOSIS — Z01810 Encounter for preprocedural cardiovascular examination: Secondary | ICD-10-CM | POA: Diagnosis not present

## 2021-10-23 NOTE — Patient Instructions (Signed)
Medication Instructions:  ?Your physician recommends that you continue on your current medications as directed. Please refer to the Current Medication list given to you today.  ? ?Labwork: ?NONE ? ?Testing/Procedures: ?NONE ? ?Follow-Up: ?AS NEEDED  ? ?  ?
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
## 2021-10-24 ENCOUNTER — Ambulatory Visit (INDEPENDENT_AMBULATORY_CARE_PROVIDER_SITE_OTHER): Payer: 59 | Admitting: Family Medicine

## 2021-10-24 ENCOUNTER — Encounter: Payer: Self-pay | Admitting: Family Medicine

## 2021-10-24 VITALS — BP 120/78 | HR 111 | Temp 99.0°F | Wt 173.0 lb

## 2021-10-24 DIAGNOSIS — R739 Hyperglycemia, unspecified: Secondary | ICD-10-CM

## 2021-10-24 DIAGNOSIS — E785 Hyperlipidemia, unspecified: Secondary | ICD-10-CM

## 2021-10-24 DIAGNOSIS — K1321 Leukoplakia of oral mucosa, including tongue: Secondary | ICD-10-CM | POA: Diagnosis not present

## 2021-10-24 DIAGNOSIS — E039 Hypothyroidism, unspecified: Secondary | ICD-10-CM

## 2021-10-24 DIAGNOSIS — F900 Attention-deficit hyperactivity disorder, predominantly inattentive type: Secondary | ICD-10-CM

## 2021-10-24 MED ORDER — LISDEXAMFETAMINE DIMESYLATE 30 MG PO CAPS: 30.0000 mg | ORAL_CAPSULE | Freq: Every day | ORAL | 0 refills | Status: DC

## 2021-10-24 NOTE — Progress Notes (Signed)
? ?  Subjective:  ? ? Patient ID: Kristina Pearson, female    DOB: Apr 02, 1956, 66 y.o.   MRN: [id number] ? ?HPI ?Here for several issues. First she is scheduled for a right total hip arthroplasty on 11-24-21 per Dr. Adriana Mccallum. She saw Dr. Buford Dresser yesterday for cardiovascular clearance, and she is supposed to see [place] as well. She feels fine in general. She does mention noticing a small rough patch of mucus membrane inside the left cheek. She noticed this about 4 weeks ago. It is not painful.  ? ? ?Review of Systems  ?Constitutional: Negative.   ?Respiratory: Negative.    ?Cardiovascular: Negative.   ?Gastrointestinal: Negative.   ?Genitourinary: Negative.   ?Neurological: Negative.   ? ?   ?Objective:  ? Physical Exam ?Constitutional:   ?   Appearance: Normal appearance.  ?HENT:  ?   Mouth/Throat:  ?   Comments: There is a 1 cm patch of membrane on the left buccal surface which is slightly less pink than the surrounding tissue. This has a slightly cobble-stoned texture  ?Cardiovascular:  ?   Rate and Rhythm: Normal rate and regular rhythm.  ?   Pulses: Normal pulses.  ?   Heart sounds: Normal heart sounds.  ?Pulmonary:  ?   Effort: Pulmonary effort is normal.  ?   Breath sounds: Normal breath sounds.  ?Musculoskeletal:  ?   Right lower leg: No edema.  ?   Left lower leg: No edema.  ?Neurological:  ?   General: No focal deficit present.  ?   Mental Status: She is alert. Mental status is at baseline.  ? ? ? ? ? ?   ?Assessment & Plan:  ?As far as her surgical clearance, she is cleared with minimal risk involved. 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 I asked her to see her dentist soon to evaluate this further. ?[name], MD ? ? ?

## 2021-10-27 ENCOUNTER — Other Ambulatory Visit (INDEPENDENT_AMBULATORY_CARE_PROVIDER_SITE_OTHER): Payer: 59

## 2021-10-27 ENCOUNTER — Encounter: Payer: Self-pay | Admitting: Family Medicine

## 2021-10-27 DIAGNOSIS — E785 Hyperlipidemia, unspecified: Secondary | ICD-10-CM | POA: Diagnosis not present

## 2021-10-27 DIAGNOSIS — E039 Hypothyroidism, unspecified: Secondary | ICD-10-CM | POA: Diagnosis not present

## 2021-10-27 DIAGNOSIS — R739 Hyperglycemia, unspecified: Secondary | ICD-10-CM

## 2021-10-27 LAB — LIPID PANEL
Cholesterol: 214 mg/dL — ABNORMAL HIGH (ref 0–200)
HDL: 75.7 mg/dL (ref >39.00)
LDL Cholesterol: 123 mg/dL — ABNORMAL HIGH (ref 0–99)
NonHDL: 138.59
Total CHOL/HDL Ratio: 3
Triglycerides: 78 mg/dL (ref 0.0–149.0)
VLDL: 15.6 mg/dL (ref 0.0–40.0)

## 2021-10-27 LAB — BASIC METABOLIC PANEL
BUN: 14 mg/dL (ref 6–23)
CO2: 25 mEq/L (ref 19–32)
Calcium: 9.5 mg/dL (ref 8.4–10.5)
Chloride: 100 mEq/L (ref 96–112)
Creatinine, Ser: 0.94 mg/dL (ref 0.40–1.20)
GFR: 63.43 mL/min (ref >60.00)
Glucose, Bld: 106 mg/dL — ABNORMAL HIGH (ref 70–99)
Potassium: 4.4 mEq/L (ref 3.5–5.1)
Sodium: 135 mEq/L (ref 135–145)

## 2021-10-27 LAB — CBC WITH DIFFERENTIAL/PLATELET
Basophils Absolute: 0.1 10*3/uL (ref 0.0–0.1)
Basophils Relative: 0.9 % (ref 0.0–3.0)
Eosinophils Absolute: 0.2 10*3/uL (ref 0.0–0.7)
Eosinophils Relative: 2.3 % (ref 0.0–5.0)
HCT: 41.1 % (ref 36.0–46.0)
Hemoglobin: 13.6 g/dL (ref 12.0–15.0)
Lymphocytes Relative: 26.7 % (ref 12.0–46.0)
Lymphs Abs: 1.8 10*3/uL (ref 0.7–4.0)
MCHC: 33 g/dL (ref 30.0–36.0)
MCV: 94.4 fl (ref 78.0–100.0)
Monocytes Absolute: 0.5 10*3/uL (ref 0.1–1.0)
Monocytes Relative: 7.3 % (ref 3.0–12.0)
Neutro Abs: 4.2 10*3/uL (ref 1.4–7.7)
Neutrophils Relative %: 62.8 % (ref 43.0–77.0)
Platelets: 261 10*3/uL (ref 150.0–400.0)
RBC: 4.35 Mil/uL (ref 3.87–5.11)
RDW: 12.6 % (ref 11.5–15.5)
WBC: 6.7 10*3/uL (ref 4.0–10.5)

## 2021-10-27 LAB — HEMOGLOBIN A1C: Hgb A1c MFr Bld: 5.3 % (ref 4.6–6.5)

## 2021-10-27 LAB — T4, FREE: Free T4: 1.18 ng/dL (ref 0.60–1.60)

## 2021-10-27 LAB — HEPATIC FUNCTION PANEL
ALT: 16 U/L (ref 0–35)
AST: 18 U/L (ref 0–37)
Albumin: 4.4 g/dL (ref 3.5–5.2)
Alkaline Phosphatase: 52 U/L (ref 39–117)
Bilirubin, Direct: 0.1 mg/dL (ref 0.0–0.3)
Total Bilirubin: 0.5 mg/dL (ref 0.2–1.2)
Total Protein: 7.5 g/dL (ref 6.0–8.3)

## 2021-10-27 LAB — T3, FREE: T3, Free: 2.7 pg/mL (ref 2.3–4.2)

## 2021-10-27 LAB — TSH: TSH: 0.8 u[IU]/mL (ref 0.35–5.50)

## 2021-10-28 NOTE — Telephone Encounter (Signed)
No it is too late to add any more orders. She can follow up with [place] as needed though  ?

## 2021-10-31 ENCOUNTER — Ambulatory Visit (INDEPENDENT_AMBULATORY_CARE_PROVIDER_SITE_OTHER): Payer: 59 | Admitting: Family Medicine

## 2021-10-31 ENCOUNTER — Encounter: Payer: Self-pay | Admitting: Family Medicine

## 2021-10-31 VITALS — BP 114/80 | HR 94 | Temp 98.5°F | Ht 65.75 in | Wt 172.2 lb

## 2021-10-31 DIAGNOSIS — Z0000 Encounter for general adult medical examination without abnormal findings: Secondary | ICD-10-CM | POA: Diagnosis not present

## 2021-10-31 NOTE — Progress Notes (Signed)
? ?  Subjective:  ? ? Patient ID: Kristina Pearson, female    DOB: 04-Jun-1956, 66 y.o.   MRN: [id number] ? ?HPI ?Here for a well exam. She feels well today. She had her labs done last week, and we reviewed these results. 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  ? ? ?Review of Systems  ?Constitutional: Negative.   ?HENT: Negative.    ?Eyes: Negative.   ?Respiratory: Negative.    ?Cardiovascular: Negative.   ?Gastrointestinal: Negative.   ?Genitourinary:  Negative for decreased urine volume, difficulty urinating, dyspareunia, dysuria, enuresis, flank pain, frequency, hematuria, pelvic pain and urgency.  ?Musculoskeletal: Negative.   ?Skin: Negative.   ?Neurological: Negative.  Negative for headaches.  ?Psychiatric/Behavioral: Negative.    ? ?   ?Objective:  ? Physical Exam ?Constitutional:   ?   General: She is not in acute distress. ?   Appearance: Normal appearance. She is well-developed.  ?HENT:  ?   Head: Normocephalic and atraumatic.  ?   Right Ear: External ear normal.  ?   Left Ear: External ear normal.  ?   Nose: Nose normal.  ?   Mouth/Throat:  ?   Pharynx: No oropharyngeal exudate.  ?Eyes:  ?   General: No scleral icterus. ?   Conjunctiva/sclera: Conjunctivae normal.  ?   Pupils: Pupils are equal, round, and reactive to light.  ?Neck:  ?   Thyroid: No thyromegaly.  ?   Vascular: No JVD.  ?Cardiovascular:  ?   Rate and Rhythm: Normal rate and regular rhythm.  ?   Heart sounds: Normal heart sounds. No murmur heard. ?  No friction rub. No gallop.  ?Pulmonary:  ?   Effort: Pulmonary effort is normal. No respiratory distress.  ?   Breath sounds: Normal breath sounds. No wheezing or rales.  ?Chest:  ?   Chest wall: No tenderness.  ?Abdominal:  ?   General: Bowel sounds are normal. There is no distension.  ?   Palpations: Abdomen is soft. There is no mass.  ?   Tenderness: There is no abdominal tenderness. There is no  guarding or rebound.  ?Musculoskeletal:     ?   General: No tenderness. Normal range of motion.  ?   Cervical back: Normal range of motion and neck supple.  ?Lymphadenopathy:  ?   Cervical: No cervical adenopathy.  ?Skin: ?   General: Skin is warm and dry.  ?   Findings: No erythema or rash.  ?Neurological:  ?   Mental Status: She is alert and oriented to person, place, and time.  ?   Cranial Nerves: No cranial nerve deficit.  ?   Motor: No abnormal muscle tone.  ?   Coordination: Coordination normal.  ?   Deep Tendon Reflexes: Reflexes are normal and symmetric. Reflexes normal.  ?Psychiatric:     ?   Behavior: Behavior normal.     ?   Thought Content: Thought content normal.     ?   Judgment: Judgment normal.  ? ? ? ? ? ?   ?Assessment & Plan:  ?Well exam. We discussed diet and exercise. She seems to be doing well.  ?[name], MD ? ? ?

## 2021-11-13 ENCOUNTER — Telehealth: Payer: Self-pay | Admitting: Family Medicine

## 2021-11-13 MED ORDER — ALPRAZOLAM 0.5 MG PO TABS: 0.5000 mg | ORAL_TABLET | Freq: Two times a day (BID) | ORAL | 2 refills | Status: DC | PRN

## 2021-11-13 NOTE — Telephone Encounter (Signed)
Done

## 2021-11-13 NOTE — Telephone Encounter (Signed)
Last OV- 10/31/2021 Last refill- 09/19/2021  No future OV scheduled.

## 2021-11-13 NOTE — Telephone Encounter (Signed)
Pt notified via My Chart

## 2021-11-13 NOTE — Telephone Encounter (Signed)
Pt is calling and would like a refill on ALPRAZolam Duanne Moron) 0.5 MG tablet  [id number] [address]:  [phone] Fax:  [phone]

## 2022-01-20 ENCOUNTER — Other Ambulatory Visit: Payer: Self-pay | Admitting: Family Medicine

## 2022-02-08 IMAGING — MR MR CERVICAL SPINE W/O CM
5 series · 36 of 48 positions shown · non-contrast
Comparison: Cervical spine CT 05/13/2020.  Cervical MRI 04/26/2014

CLINICAL DATA: Cervical radiculopathy. Pain in the neck that
extends down both arms

EXAM:
MRI CERVICAL SPINE WITHOUT CONTRAST
TECHNIQUE: Multiplanar, multisequence MR imaging of the cervical spine was
performed. No intravenous contrast was administered.

[Series 2: T2 · sagittal · 3.0mm · 0.41mm/px · 8 of 17 slices shown (1 of 2)]
[im 1/17]
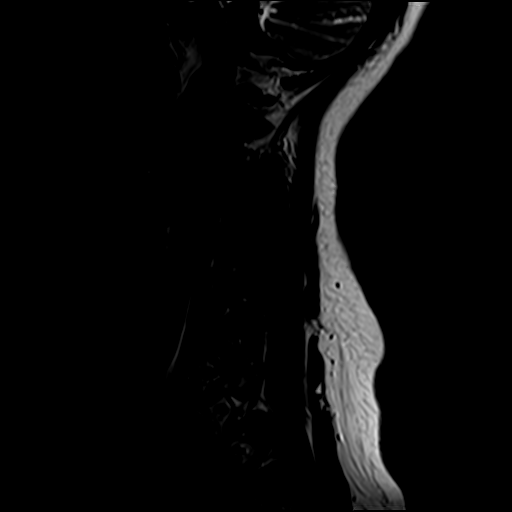
[im 3/17]
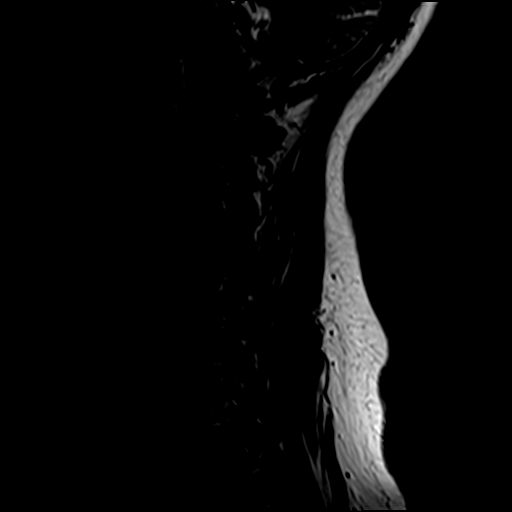
[im 5/17]
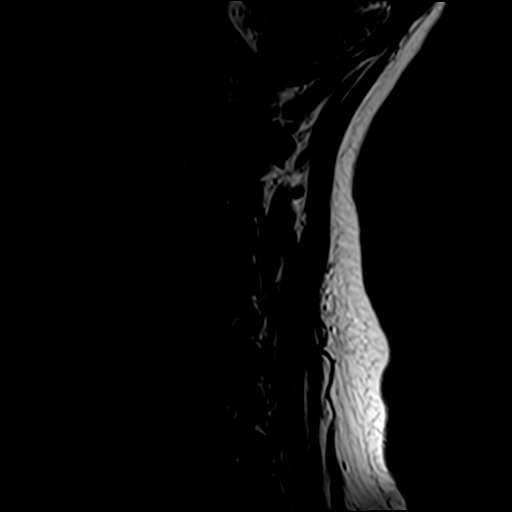
[im 7/17]
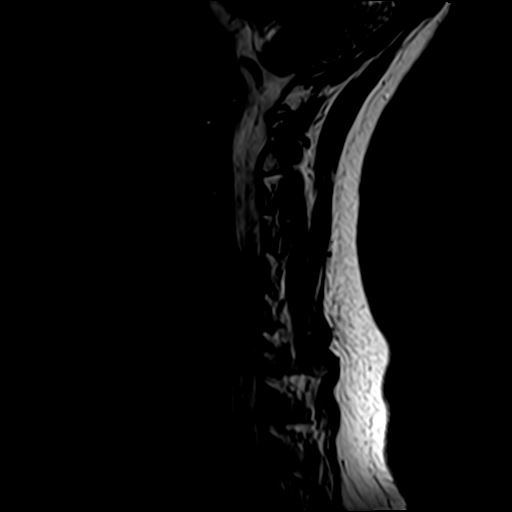
[im 10/17]
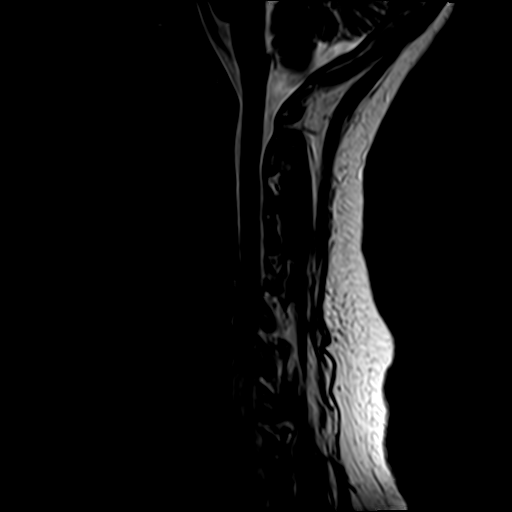
[im 12/17]
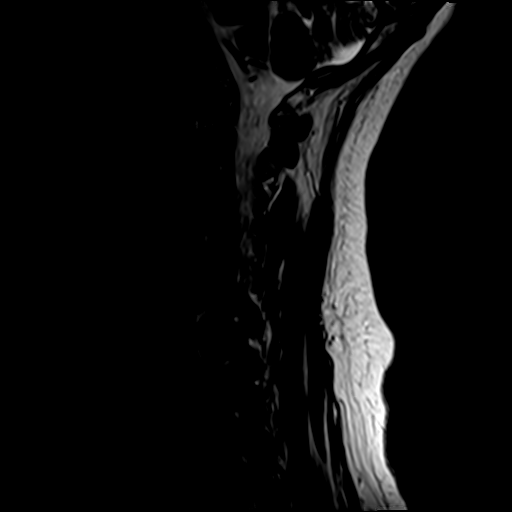
[im 14/17]
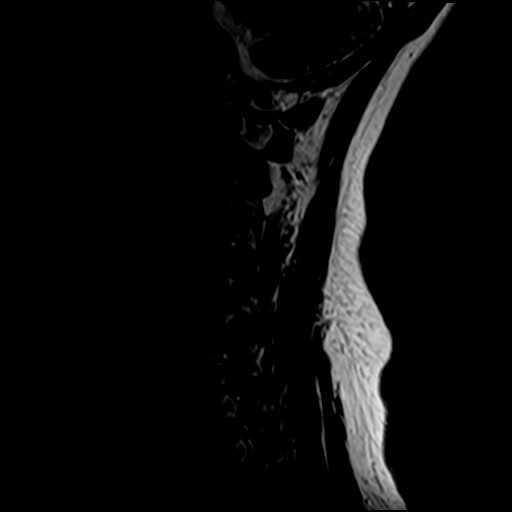
[im 17/17]
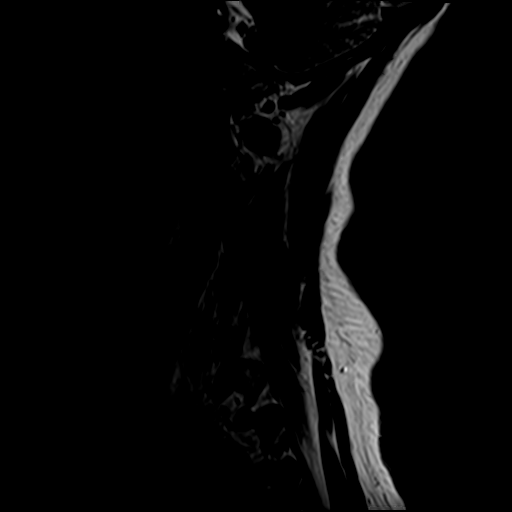

[Series 3: STIR · sagittal · 3.0mm · 0.82mm/px · 8 of 17 slices shown]
[im 1/17]
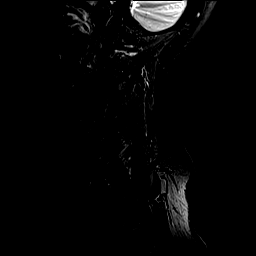
[im 3/17]
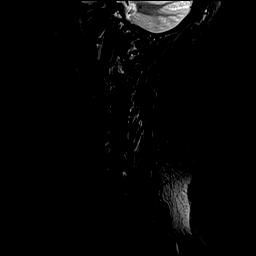
[im 5/17]
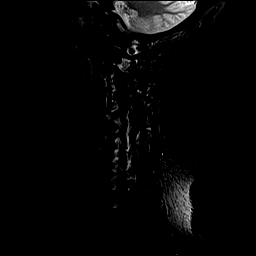
[im 7/17]
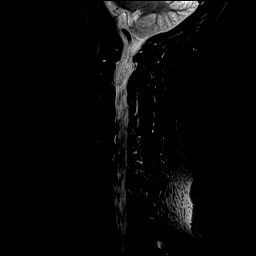
[im 10/17]
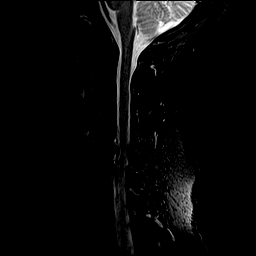
[im 12/17]
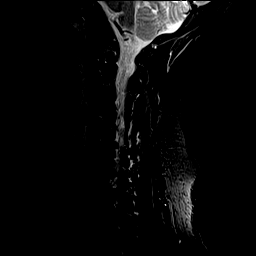
[im 14/17]
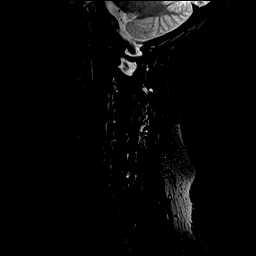
[im 17/17]
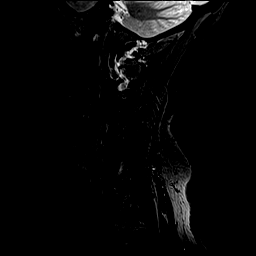

[Series 4: T1 · sagittal · 3.0mm · 0.82mm/px · 8 of 17 slices shown]
[im 1/17]
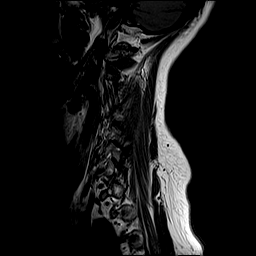
[im 3/17]
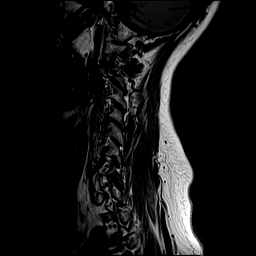
[im 5/17]
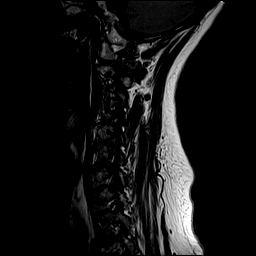
[im 7/17]
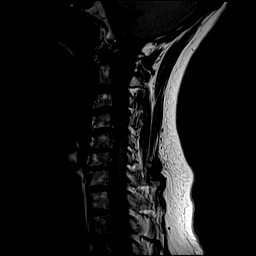
[im 10/17]
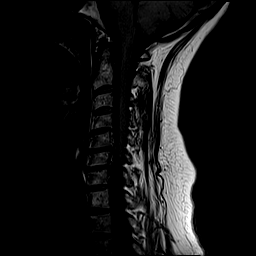
[im 12/17]
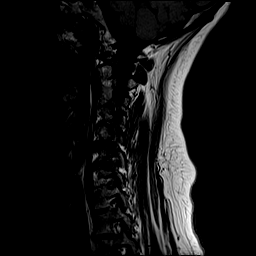
[im 14/17]
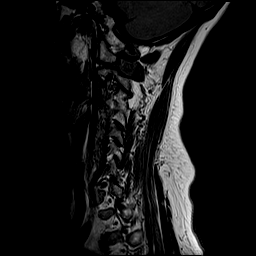
[im 17/17]
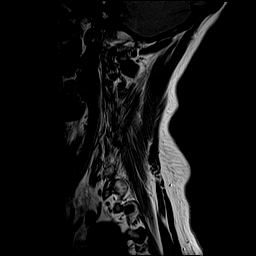

[Series 5: T2 · axial · 3.0mm · 0.70mm/px · z∈[-54,+37]mm · 9 of 26 slices shown (2 of 2)]
[im 1/26]
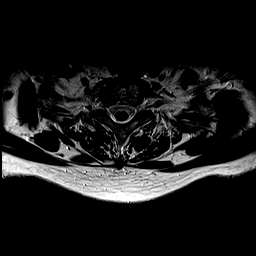
[im 5/26]
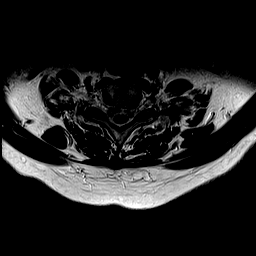
[im 7/26]
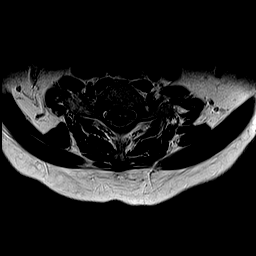
[im 12/26]
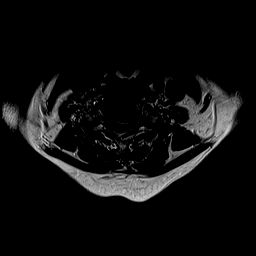
[im 14/26]
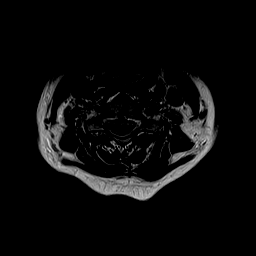
[im 19/26]
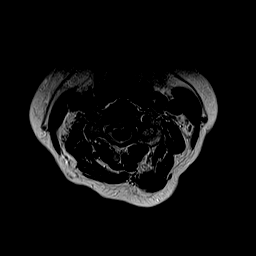
[im 21/26]
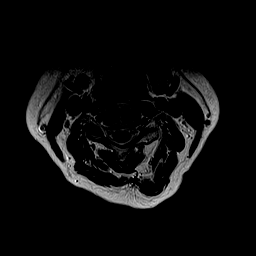
[im 23/26]
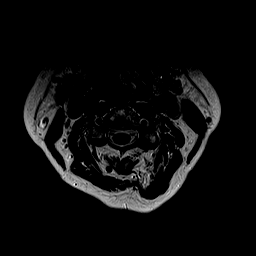
[im 26/26]
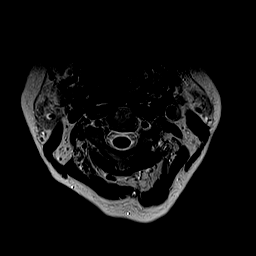

[Series 6: GRE · axial · 3.0mm · 0.35mm/px · z∈[-54,-32]mm · 3 of 26 slices shown]
[im 1/26]
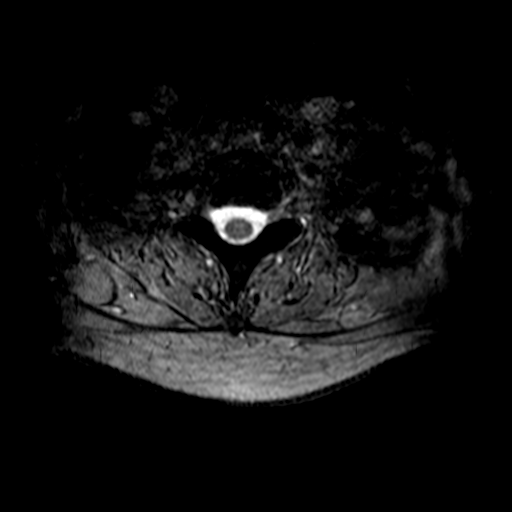
[im 5/26]
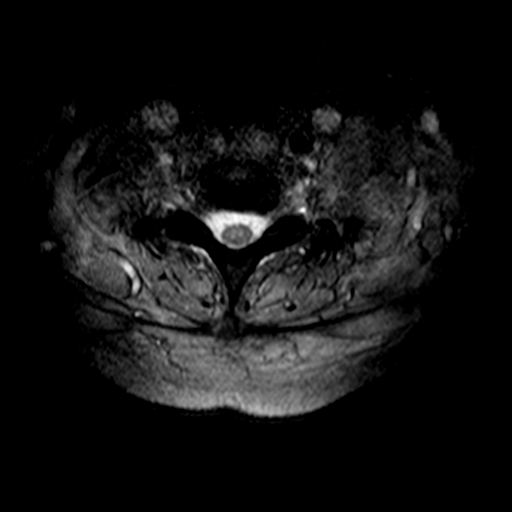
[im 7/26]
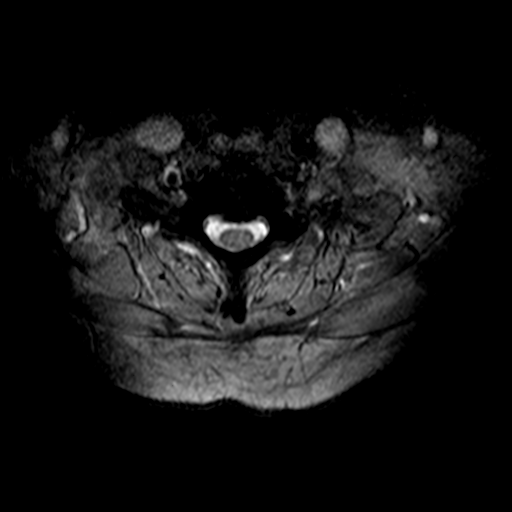

[36 of 48 positions shown; findings below may reference images not displayed]

FINDINGS: Alignment: Degenerative reversal of cervical lordosis.

Vertebrae: No fracture, evidence of discitis, or bone lesion.

Cord: Normal signal and morphology.

Posterior Fossa, vertebral arteries, paraspinal tissues: Negative.

Disc levels:

C2-3: Mild facet spurring.

C3-4: Facet spurring with ankylosis on the left

C4-5: Disc narrowing and bulging with uncovertebral ridging
asymmetric to the right. Moderate right foraminal stenosis. Patent
spinal canal

C5-6: Disc narrowing and bulging with central disc protrusion and
buttressing osteophytes flattening the ventral cord. Asymmetric left
uncovertebral spurring and left foraminal impingement. Right
foraminal narrowing is mild.

C6-7: Disc narrowing and bulging with endplate and uncovertebral
ridging eccentric to the left. Left foraminal impingement.

C7-T1:Unremarkable.
IMPRESSION: 1. Disc degeneration mainly at C4-5 to C6-7 with progression from
6987.
2. Spinal stenosis at C5-6 and C6-7. At C5-6 there is ventral cord
flattening.
3. Foraminal impingement most notable on the right at C4-5 and left
at C5-6, C6-7.

## 2022-02-18 ENCOUNTER — Other Ambulatory Visit: Payer: Self-pay | Admitting: Family Medicine

## 2022-02-19 NOTE — Telephone Encounter (Signed)
Dr. Barbie Banner patient  Last refill- 08/13/21-30 tabs, 5 refills Last OV CPE- 10/31/21  No future OV scheduled.

## 2022-02-24 ENCOUNTER — Other Ambulatory Visit: Payer: Self-pay | Admitting: Family Medicine

## 2022-02-25 NOTE — Telephone Encounter (Signed)
Pt LOV was on 10/31/2021 Last refill was done on 11/13/21 Please advise

## 2022-03-15 ENCOUNTER — Other Ambulatory Visit: Payer: Self-pay | Admitting: Family Medicine

## 2022-03-20 ENCOUNTER — Other Ambulatory Visit: Payer: Self-pay | Admitting: Adult Health

## 2022-03-23 NOTE — Telephone Encounter (Signed)
Last OV 10/31/21 CPE Last refill in epic 02/19/22

## 2022-03-27 ENCOUNTER — Telehealth: Payer: Self-pay

## 2022-03-27 MED ORDER — TEMAZEPAM 30 MG PO CAPS: ORAL_CAPSULE | ORAL | 5 refills | Status: DC

## 2022-03-27 NOTE — Addendum Note (Signed)
Addended by: [name] A on: 03/27/2022 04:01 PM   Modules accepted: Orders

## 2022-03-27 NOTE — Telephone Encounter (Signed)
Message sent to pcp for advise

## 2022-03-27 NOTE — Telephone Encounter (Signed)
Done

## 2022-05-21 ENCOUNTER — Other Ambulatory Visit: Payer: Self-pay | Admitting: Family Medicine

## 2022-06-19 NOTE — Progress Notes (Signed)
This encounter was created in error - please disregard.

## 2022-07-28 ENCOUNTER — Ambulatory Visit: Payer: 59 | Admitting: Adult Health

## 2022-09-01 ENCOUNTER — Other Ambulatory Visit: Payer: Self-pay | Admitting: Family Medicine

## 2022-12-18 ENCOUNTER — Other Ambulatory Visit: Payer: Self-pay | Admitting: Family Medicine

## 2022-12-18 DIAGNOSIS — M5416 Radiculopathy, lumbar region: Secondary | ICD-10-CM

## 2022-12-18 DIAGNOSIS — M5136 Other intervertebral disc degeneration, lumbar region: Secondary | ICD-10-CM

## 2022-12-23 ENCOUNTER — Encounter: Payer: Self-pay | Admitting: Family Medicine

## 2022-12-26 ENCOUNTER — Ambulatory Visit
Admission: RE | Admit: 2022-12-26 | Discharge: 2022-12-26 | Disposition: A | Discharge status: Home / Self Care | Payer: 59 | Source: Ambulatory Visit | Attending: Family Medicine | Admitting: Family Medicine

## 2022-12-26 DIAGNOSIS — M5136 Other intervertebral disc degeneration, lumbar region: Secondary | ICD-10-CM

## 2022-12-26 DIAGNOSIS — M5416 Radiculopathy, lumbar region: Secondary | ICD-10-CM

## 2024-04-19 ENCOUNTER — Ambulatory Visit: Admitting: Family Medicine

## 2024-04-19 ENCOUNTER — Encounter: Payer: Self-pay | Admitting: Family Medicine

## 2024-04-19 VITALS — BP 122/80 | HR 73 | Temp 98.6°F | Ht 65.75 in | Wt 178.0 lb

## 2024-04-19 DIAGNOSIS — E039 Hypothyroidism, unspecified: Secondary | ICD-10-CM

## 2024-04-19 DIAGNOSIS — F419 Anxiety disorder, unspecified: Secondary | ICD-10-CM | POA: Diagnosis not present

## 2024-04-19 DIAGNOSIS — R739 Hyperglycemia, unspecified: Secondary | ICD-10-CM

## 2024-04-19 DIAGNOSIS — M1990 Unspecified osteoarthritis, unspecified site: Secondary | ICD-10-CM

## 2024-04-19 DIAGNOSIS — N289 Disorder of kidney and ureter, unspecified: Secondary | ICD-10-CM

## 2024-04-19 MED ORDER — ESTRADIOL 0.01 % VA CREA: TOPICAL_CREAM | VAGINAL | 12 refills | Status: AC

## 2024-04-19 MED ORDER — ALPRAZOLAM 0.5 MG PO TABS: 0.5000 mg | ORAL_TABLET | Freq: Two times a day (BID) | ORAL | 5 refills | Status: AC | PRN

## 2024-04-19 NOTE — Progress Notes (Signed)
   Subjective:    Patient ID: Kristina Pearson Free, female    DOB: 08-17-1955, 68 y.o.   MRN: [id number]  HPI Here to re-establish after she had been living in [place] for the past 2 [place]. She was there to help her daughter take care of her new grandchild while her son-in-law was away on a designer, industrial/product. He is now back home, so Kristina Pearson has moved back to [place]. While in [place] she saw Dr. Susan Szulc for primary care. Since returning to [facility] she has been seeing Dr. Cesario for low back pain, and she has received 2 epidural steroid injections. These have been helpful. She has also been seeing Dr. Lynwood Ramsay for rheumatology care. All her tests for inflammatory arthritis have been negative. 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 He did lab work on [place] on 04-11-24 which were significant for a slightly elevated creatinine of 1.10 and a slightly low GFR of 55. He asked Aara to follow up with us  for this. She has no hx of kidney disease. She has also had elevated glucoses in the past. She takes Levothyroxine  for hypothyroidism, and levels have not been checked in almost a year. Other than her joint pains, she feels quite well.    Review of Systems  Constitutional: Negative.   Respiratory: Negative.    Cardiovascular: Negative.   Musculoskeletal:  Positive for arthralgias.  Psychiatric/Behavioral: Negative.         Objective:   Physical Exam Constitutional:      Appearance: Normal appearance.  Cardiovascular:     Rate and Rhythm: Normal rate and regular rhythm.     Pulses: Normal pulses.     Heart sounds: Normal heart sounds.  Pulmonary:     Effort: Pulmonary effort is normal.     Breath sounds: Normal breath sounds.  Neurological:     Mental Status: She is alert and oriented to person, place, and time.  Psychiatric:        Mood and Affect: Mood normal.           Assessment & Plan:  She will follow up with Dr. Ramsay for OA. We will get labs today  to check kidney function, thyroid  levels, and an A1c. I asked her to drink plenty of water every day. I personally spent a total of 34 minutes in the care of the patient today including getting/reviewing separately obtained history, performing a medically appropriate exam/evaluation, placing orders, and independently interpreting results.  Garnette Olmsted, MD

## 2024-05-01 ENCOUNTER — Other Ambulatory Visit

## 2024-05-01 DIAGNOSIS — E039 Hypothyroidism, unspecified: Secondary | ICD-10-CM

## 2024-05-01 DIAGNOSIS — R739 Hyperglycemia, unspecified: Secondary | ICD-10-CM

## 2024-05-01 LAB — BASIC METABOLIC PANEL WITH GFR
BUN: 18 mg/dL (ref 6–23)
CO2: 28 meq/L (ref 19–32)
Calcium: 9.5 mg/dL (ref 8.4–10.5)
Chloride: 103 meq/L (ref 96–112)
Creatinine, Ser: 1.01 mg/dL (ref 0.40–1.20)
GFR: 57.17 mL/min — ABNORMAL LOW (ref >60.00)
Glucose, Bld: 107 mg/dL — ABNORMAL HIGH (ref 70–99)
Potassium: 4.4 meq/L (ref 3.5–5.1)
Sodium: 139 meq/L (ref 135–145)

## 2024-05-01 LAB — T3, FREE: T3, Free: 2.9 pg/mL (ref 2.3–4.2)

## 2024-05-01 LAB — HEMOGLOBIN A1C: Hgb A1c MFr Bld: 5.3 % (ref 4.6–6.5)

## 2024-05-01 LAB — T4, FREE: Free T4: 1.15 ng/dL (ref 0.60–1.60)

## 2024-05-01 LAB — TSH: TSH: 0.72 u[IU]/mL (ref 0.35–5.50)

## 2024-05-02 ENCOUNTER — Ambulatory Visit: Payer: Self-pay | Admitting: Family Medicine

## 2024-05-09 ENCOUNTER — Encounter: Payer: Self-pay | Admitting: Family Medicine

## 2024-05-09 ENCOUNTER — Ambulatory Visit (INDEPENDENT_AMBULATORY_CARE_PROVIDER_SITE_OTHER): Admitting: Family Medicine

## 2024-05-09 VITALS — BP 118/74 | HR 79 | Temp 98.0°F | Wt 180.0 lb

## 2024-05-09 DIAGNOSIS — I809 Phlebitis and thrombophlebitis of unspecified site: Secondary | ICD-10-CM

## 2024-05-09 DIAGNOSIS — M150 Primary generalized (osteo)arthritis: Secondary | ICD-10-CM | POA: Diagnosis not present

## 2024-05-09 NOTE — Progress Notes (Signed)
   Subjective:    Patient ID: Kristina Pearson, female    DOB: February 12, 1956, 68 y.o.   MRN: [id number]  HPI Here asking to be referred for low dose radiation therapy for her osteoarthritis. This was suggested to her by her rheumatologist, Dr. Mai. She also asks for a RX for compression stockings to wear on an upcoming plane flight.    Review of Systems  Constitutional: Negative.   Respiratory: Negative.    Cardiovascular: Negative.   Musculoskeletal:  Positive for arthralgias.       Objective:   Physical Exam Constitutional:      Appearance: Normal appearance.  Cardiovascular:     Rate and Rhythm: Normal rate and regular rhythm.     Pulses: Normal pulses.     Heart sounds: Normal heart sounds.  Pulmonary:     Effort: Pulmonary effort is normal.     Breath sounds: Normal breath sounds.  Neurological:     Mental Status: She is alert.           Assessment & Plan:  For the OA, we will refer her to Radiation Oncology to consider low dose radiation therapy. We also gave her a RX for a pair of hip length compression stockings to be worn at 30 mmHg pressure to treat for phlebitis.  Garnette Olmsted, MD

## 2024-05-16 ENCOUNTER — Telehealth: Payer: Self-pay | Admitting: Radiation Oncology

## 2024-05-16 NOTE — Telephone Encounter (Signed)
 11/25 spoke to patient about recent images.  She will bring in cd and/or requested to be sent to our office.  Waiting on images.

## 2024-06-29 ENCOUNTER — Inpatient Hospital Stay: Attending: Adult Health | Admitting: Adult Health

## 2024-06-29 ENCOUNTER — Encounter: Payer: Self-pay | Admitting: Adult Health

## 2024-06-29 ENCOUNTER — Inpatient Hospital Stay

## 2024-06-29 VITALS — BP 123/59 | HR 76 | Temp 97.9°F | Resp 17 | Wt 185.5 lb

## 2024-06-29 DIAGNOSIS — Z170 Estrogen receptor positive status [ER+]: Secondary | ICD-10-CM | POA: Diagnosis not present

## 2024-06-29 DIAGNOSIS — C50411 Malignant neoplasm of upper-outer quadrant of right female breast: Secondary | ICD-10-CM

## 2024-06-29 NOTE — Progress Notes (Signed)
  Cancer Center Cancer Follow up:    Johnny Garnette LABOR, MD [address] [place] [address]   DIAGNOSIS: Cancer Staging  Breast cancer of upper-outer quadrant of right female breast [facility]) Staging form: Breast, AJCC 7th Edition - Clinical: Stage 0 (Tis, N0, cM0) - Unsigned Specimen type: Core Needle Biopsy Histopathologic type: 9932 Laterality: Right Staging comments: Staged at breast conference 08/23/13  - Pathologic: No stage assigned - Unsigned Specimen type: Core Needle Biopsy Histopathologic type: 9932 Laterality: Right    SUMMARY OF ONCOLOGIC HISTORY: Oncology History  Breast cancer of upper-outer quadrant of right female breast (HCC)  08/15/2013 Initial Diagnosis   DCIS with calcifications   08/30/2013 Surgery   Right breast lumpectomy: No evidence of residual DCIS found to have atypical lobular hyperplasia   09/26/2013 - 11/09/2013 Radiation Therapy   Adjuvant radiation therapy by Dr. Jason   11/21/2013 - 08/22/2015 Anti-estrogen oral therapy   Tamoxifen  20 mg daily stopped because patient was having problems with lack of interest in life which she attributed to tamoxifen .   10/01/2019 Genetic Testing   PTEN c.1115A>G (p.Asn372Ser) VUS, but otherwise Negative genetic testing on the multicancer gene and preliminary evidence colorectal cancer gene panel.  The Multi-Gene Panel offered by [facility] includes sequencing and/or deletion duplication testing of the following 91 genes: AIP, ALK, APC, ATM, AXIN2,BAP1,  BARD1, BLM, BMPR1A, BRCA1, BRCA2, BRIP1, BUB1B, CASR, CDC73, CDH1, CDK4, CDKN1B, CDKN1C, CDKN2A (p14ARF), CDKN2A (p16INK4a), CEBPA, CFP57, CHEK2, CTNNA1, DICER1, DIS3L2, EGFR (c.2369C>T, p.Thr790Met variant only), EPCAM (Deletion/duplication testing only), ENG, FH, FLCN, GALANT12, GATA2, GPC3, GREM1 (Promoter region deletion/duplication testing only), HOXB13 (c.251G>A, p.Gly84Glu), HRAS, KIT, MAX, MEN1, MET, MITF (c.952G>A, p.Glu318Lys variant only), MLH1,  MSH2, MSH3, MSH6, MUTYH, NBN, NF1, NF2, NTHL1, PALB2, PDGFRA, PHOX2B, PMS2, POLD1, POLE, POT1, PRKAR1A, PTCH1, PTEN, RAD50, RAD51C, RAD51D, RB1, RECQL4, RET, RNF43, RSP20, RUNX1, SDHAF2, SDHA (sequence changes only), SDHB, SDHC, SDHD, SMAD4, SMARCA4, SMARCB1, SMARCE1, STK11, SUFU, TERC, TERT, TMEM127, TP53, TSC1, TSC2, VHL, WRN and WT1. The report date is 10/01/2019.     CURRENT THERAPY: observation  INTERVAL HISTORY:  Discussed the use of AI scribe software for clinical note transcription with the patient, who gave verbal consent to proceed.  History of Present Illness Kristina Pearson is a 69 year old woman with right breast ductal carcinoma in situ, status post lumpectomy, adjuvant radiation, and tamoxifen , presenting for routine post-treatment oncology surveillance.  Her most recent mammogram in January 2025 was reported as normal per a letter. She has not had oncology follow-up in two to three years due to relocation and presents to reestablish annual surveillance.  She recently had intermittent dry skin patches on her breasts, trunk, and posterior leg that began on the breasts this past summer and have mostly resolved, leaving only a possible scar. She notes persistent tenderness along the lateral aspects of both breasts, worse on the left, similar to prior episodes for which ultrasound was considered but not done.  She describes multiple nodules on the medial arms and posterior legs, as well as swelling in the neck obscuring the clavicle, which she associates with weight gain. Nodules are bilateral, generally non-tender, with occasional elbow soreness that she attributes to osteoarthritis. She has a longstanding firm nodule behind the left knee and several softer, fluctuant nodules elsewhere, with one mildly tender. She also has neck pain, for which she took a muscle relaxant the morning of the visit.  She has night sweats once or twice per month that require changing her nightgown. She  reports  weight gain rather than weight loss and has poor energy. She denies abdominal pain, seizures, or other systemic symptoms.  She was evaluated by rheumatology in late summer 2025 for musculoskeletal symptoms, including laboratory workup for multiple myeloma. She did not receive results and presumed they were negative.     Patient Active Problem List   Diagnosis Date Noted   Migraines 09/01/2021   Genetic testing 10/02/2019   Environmental and seasonal allergies 09/19/2019   Family history of colon cancer    Family history of melanoma    Family history of ovarian cancer    Family history of breast cancer    Anxiety disorder 01/09/2019   Cervical stenosis of spine 10/21/2018   Insomnia 10/21/2018   Breast tenderness in female 02/05/2014   Pes planus of both feet 01/15/2014   Low back pain 11/23/2013   Phlebitis 10/06/2013   History of depression 09/26/2013   Breast cancer of upper-outer quadrant of right female breast (HCC) 08/18/2013   ADHD (attention deficit hyperactivity disorder) 12/01/2011   Hypothyroidism 05/05/2007   GERD 05/05/2007   OA (osteoarthritis) 05/05/2007    has no known allergies.  MEDICAL HISTORY: Past Medical History:  Diagnosis Date   ADHD    ADHD (attention deficit hyperactivity disorder)    ADD   Allergy    Anxiety    Arthritis    osteoarthritrs   Breast cancer (HCC) 08/15/13   right lateral upper outer   Depression    Family history of breast cancer    Family history of colon cancer    Family history of melanoma    Family history of ovarian cancer    Fibromyalgia    GERD (gastroesophageal reflux disease)    TUMS   Headache(784.0)    sees Dr. Asberry Dixons at [facility]    History of depression    Hx of radiation therapy 10/19/13- 11/09/13   right breast 4256 cGy in 16 sessions, hypo-fractionated   Hypothyroid    Migraine    Osteopenia    Personal history of radiation therapy 2015   PONV (postoperative nausea and vomiting)     Sialoadenitis    Spinal stenosis    Urinary incontinence    Vitiligo    Wears glasses     SURGICAL HISTORY: Past Surgical History:  Procedure Laterality Date   BREAST BIOPSY Right 07/2013   BREAST LUMPECTOMY Right 08/2013   BREAST LUMPECTOMY WITH RADIOACTIVE SEED LOCALIZATION Right 08/30/2013   Procedure: RIGHT PARTIAL MASTECTOMY WITH RADIOACTIVE SEED LOCALIZATION;  Surgeon: Elon CHRISTELLA Pacini, MD;  Location: [facility];  Service: General;  Laterality: Right;   BREAST SURGERY     CHOLECYSTECTOMY  1996   lap   COLONOSCOPY  01/22/2020   per Dr. Donnald, benign polyps, repeat in 7 yrs    DIAGNOSTIC LAPAROSCOPY  1982   exp   LAPAROSCOPIC OVARIAN CYSTECTOMY     URETHRAL DILATION      SOCIAL HISTORY: Social History   Socioeconomic History   Marital status: Married    Spouse name: Not on file   Number of children: 1   Years of education: Not on file   Highest education level: Bachelor's degree (e.g., BA, AB, BS)  Occupational History   Not on file  Tobacco Use   Smoking status: Former    Current packs/day: 0.00    Types: Cigarettes    Quit date: 09/14/1978    Years since quitting: 45.8   Smokeless tobacco: Never  Tobacco comments:    short time in college  Vaping Use   Vaping status: Never Used  Substance and Sexual Activity   Alcohol use: Yes    Alcohol/week: 1.0 - 2.0 standard drink of alcohol    Types: 1 - 2 Standard drinks or equivalent per week    Comment: social   Drug use: No   Sexual activity: Yes    Partners: Male    Birth control/protection: Post-menopausal    Comment: menarche age 76, first live birth age 68, menopause age 43, no HRT  Other Topics Concern   Not on file  Social History Narrative   Lives with husband, daughter and mother in a 2 story home.  Has one child.  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  Education: college degree   Social Drivers of Health   Tobacco Use: Medium Risk (06/29/2024)   Patient History     Smoking Tobacco Use: Former    Smokeless Tobacco Use: Never    Passive Exposure: Not on file  Financial Resource Strain: Low Risk (08/08/2021)   Overall Financial Resource Strain (CARDIA)    Difficulty of Paying Living Expenses: Not hard at all  Food Insecurity: No Food Insecurity (06/29/2024)   Epic    Worried About Programme Researcher, Broadcasting/film/video in the Last Year: Never true    Ran Out of Food in the Last Year: Never true  Transportation Needs: No Transportation Needs (06/29/2024)   Epic    Lack of Transportation (Medical): No    Lack of Transportation (Non-Medical): No  Physical Activity: Sufficiently Active (08/08/2021)   Exercise Vital Sign    Days of Exercise per Week: 5 days    Minutes of Exercise per Session: 30 min  Recent Concern: Physical Activity - Insufficiently Active (08/01/2021)   Exercise Vital Sign    Days of Exercise per Week: 3 days    Minutes of Exercise per Session: 30 min  Stress: No Stress Concern Present (08/08/2021)   [employer] of Occupational Health - Occupational Stress Questionnaire    Feeling of Stress : Only a little  Social Connections: Moderately Isolated (06/29/2024)   Social Connection and Isolation Panel    Frequency of Communication with Friends and Family: More than three times a week    Frequency of Social Gatherings with Friends and Family: More than three times a week    Attends Religious Services: Never    Database Administrator or Organizations: No    Attends Banker Meetings: Never    Marital Status: Married  Catering Manager Violence: Not At Risk (06/29/2024)   Epic    Fear of Current or Ex-Partner: No    Emotionally Abused: No    Physically Abused: No    Sexually Abused: No  Depression (PHQ2-9): Medium Risk (10/31/2021)   Depression (PHQ2-9)    PHQ-2 Score: 7  Alcohol Screen: Low Risk (08/08/2021)   Alcohol Screen    Last Alcohol Screening Score (AUDIT): 3  Housing: Unknown (06/29/2024)   Epic    Unable to Pay for Housing in the  Last Year: No    Number of Times Moved in the Last Year: Not on file    Homeless in the Last Year: No  Utilities: Not At Risk (06/29/2024)   Epic    Threatened with loss of utilities: No  Health Literacy: Adequate Health Literacy (06/29/2024)   B1300 Health Literacy    Frequency of need for help with medical instructions: Never  FAMILY HISTORY: Family History  Problem Relation Age of Onset   Arthritis Mother    Diabetes Mother    Osteoporosis Mother    Polymyalgia rheumatica Mother        on prednisone   Heart disease Mother    Hyperlipidemia Mother    Hypertension Mother    Stroke Mother    Multiple myeloma Mother    Heart attack Father    Heart disease Father    Melanoma Father    Other Daughter        dermoid tumor   Other Maternal Aunt        COVID   Colon cancer Maternal Uncle        dx in his 96s   Leukemia Maternal Uncle 27       AML   Melanoma Maternal Uncle 60   Leukemia Maternal Uncle 76   Heart attack Paternal Uncle    Melanoma Paternal Uncle    Congestive Heart Failure Maternal Grandmother    Diabetes Maternal Grandmother    Diabetes Paternal Grandmother    Skin cancer Paternal Grandmother    Hypertension Paternal Grandmother    Melanoma Paternal Grandmother    Colon cancer Cousin 4       mother's maternal first cousin   Breast cancer Cousin 73       BRCA negative; father's maternal first cousin   Ovarian cancer Cousin 86       maternal cousin   Ovarian cancer Cousin 66       maternal cousin   Leukemia Cousin        dx in his 37s; maternal cousin; thought to be the result of taking Humera   Colon cancer Cousin 35       maternal second cousin   Breast cancer Other        maternal great aunt dx in her 25s   Ovarian cancer Other        maternal grandfather's sister dx in her 56s   Colon cancer Other        maternal grandfather's sister dx in her 67s   Colon cancer Other        maternal grandfather's sister dx in her 38s    Review of Systems   Constitutional:  Positive for fatigue. Negative for appetite change, chills, fever and unexpected weight change.  HENT:   Negative for hearing loss, lump/mass and trouble swallowing.   Eyes:  Negative for eye problems and icterus.  Respiratory:  Negative for chest tightness, cough and shortness of breath.   Cardiovascular:  Negative for chest pain, leg swelling and palpitations.  Gastrointestinal:  Negative for abdominal distention, abdominal pain, constipation, diarrhea, nausea and vomiting.  Endocrine: Negative for hot flashes.  Genitourinary:  Negative for difficulty urinating.   Musculoskeletal:  Negative for arthralgias.  Skin:  Negative for itching and rash.  Neurological:  Negative for dizziness, extremity weakness, headaches and numbness.  Hematological:  Negative for adenopathy. Does not bruise/bleed easily.  Psychiatric/Behavioral:  Negative for depression. The patient is not nervous/anxious.       PHYSICAL EXAMINATION    Vitals:   06/29/24 1357  BP: (!) 123/59  Pulse: 76  Resp: 17  Temp: 97.9 F (36.6 C)  SpO2: 99%    Physical Exam Constitutional:      General: She is not in acute distress.    Appearance: Normal appearance. She is not toxic-appearing.  HENT:     Head: Normocephalic and atraumatic.  Mouth/Throat:     Mouth: Mucous membranes are moist.     Pharynx: Oropharynx is clear. No oropharyngeal exudate or posterior oropharyngeal erythema.  Eyes:     General: No scleral icterus. Cardiovascular:     Rate and Rhythm: Normal rate and regular rhythm.     Pulses: Normal pulses.     Heart sounds: Normal heart sounds.  Pulmonary:     Effort: Pulmonary effort is normal.     Breath sounds: Normal breath sounds.  Chest:     Comments: Right breast s/p lumpectomy and radiation, no sign of local recurrence; left breast benign Abdominal:     General: Abdomen is flat. Bowel sounds are normal. There is no distension.     Palpations: Abdomen is soft.      Tenderness: There is no abdominal tenderness.  Musculoskeletal:        General: No swelling.     Cervical back: Neck supple.  Lymphadenopathy:     Cervical: No cervical adenopathy.     Upper Body:     Right upper body: No supraclavicular or axillary adenopathy.     Left upper body: No supraclavicular or axillary adenopathy.  Skin:    General: Skin is warm and dry.     Findings: No rash.  Neurological:     General: No focal deficit present.     Mental Status: She is alert.  Psychiatric:        Mood and Affect: Mood normal.        Behavior: Behavior normal.     LABORATORY DATA:  CBC    Component Value Date/Time   WBC 6.7 10/27/2021 1007   RBC 4.35 10/27/2021 1007   HGB 13.6 10/27/2021 1007   HGB 13.6 09/18/2019 0000   HGB 12.0 02/21/2014 1111   HCT 41.1 10/27/2021 1007   HCT 41.5 09/18/2019 0000   HCT 36.4 02/21/2014 1111   PLT 261.0 10/27/2021 1007   PLT 260 09/18/2019 0000   MCV 94.4 10/27/2021 1007   MCV 99 (H) 09/18/2019 0000   MCV 97.7 02/21/2014 1111   MCH 30.6 07/18/2021 1645   MCHC 33.0 10/27/2021 1007   RDW 12.6 10/27/2021 1007   RDW 11.8 09/18/2019 0000   RDW 12.7 02/21/2014 1111   LYMPHSABS 1.8 10/27/2021 1007   LYMPHSABS 1.0 02/21/2014 1111   MONOABS 0.5 10/27/2021 1007   MONOABS 0.3 02/21/2014 1111   EOSABS 0.2 10/27/2021 1007   EOSABS 0.2 02/21/2014 1111   BASOSABS 0.1 10/27/2021 1007   BASOSABS 0.0 02/21/2014 1111    CMP     Component Value Date/Time   NA 139 05/01/2024 1027   NA 137 09/18/2019 0000   NA 139 02/21/2014 1111   K 4.4 05/01/2024 1027   K 4.4 02/21/2014 1111   CL 103 05/01/2024 1027   CO2 28 05/01/2024 1027   CO2 27 02/21/2014 1111   GLUCOSE 107 (H) 05/01/2024 1027   GLUCOSE 99 02/21/2014 1111   BUN 18 05/01/2024 1027   BUN 8 09/18/2019 0000   BUN 16.4 02/21/2014 1111   CREATININE 1.01 05/01/2024 1027   CREATININE 0.88 10/02/2015 1033   CREATININE 1.0 02/21/2014 1111   CALCIUM  9.5 05/01/2024 1027   CALCIUM  9.0  02/21/2014 1111   PROT 7.5 10/27/2021 1007   PROT 6.5 09/18/2019 0000   PROT 6.2 (L) 02/21/2014 1111   ALBUMIN 4.4 10/27/2021 1007   ALBUMIN 4.3 09/18/2019 0000   ALBUMIN 3.4 (L) 02/21/2014 1111   AST 18 10/27/2021 1007  AST 21 02/21/2014 1111   ALT 16 10/27/2021 1007   ALT 22 02/21/2014 1111   ALKPHOS 52 10/27/2021 1007   ALKPHOS 33 (L) 02/21/2014 1111   BILITOT 0.5 10/27/2021 1007   BILITOT 0.4 09/18/2019 0000   BILITOT 0.47 02/21/2014 1111   GFRNONAA >60 07/18/2021 1645   GFRAA 74 09/18/2019 0000     ASSESSMENT and THERAPY PLAN:   Assessment and Plan Assessment & Plan Right breast ductal carcinoma in situ, post-treatment, under surveillance Right breast DCIS post-lumpectomy, radiation, and tamoxifen . Asymptomatic, no recurrence, negative recent mammogram. - Order annual screening mammogram for January at the breast center - Order blood work: CBC, CMET, and LDH since she has had fulness in areas of her LN without any palpable lymphadenopathy - Arrange breast center contact for mammogram scheduling. - Instruct to complete blood work when hydrated. - Schedule follow-up in one year or sooner if new symptoms develop. -Recommended healthy diet and regular exercise.     All questions were answered. The patient knows to call the clinic with any problems, questions or concerns. We can certainly see the patient much sooner if necessary.  Total encounter time:20 minutes*in face-to-face visit time, chart review, lab review, care coordination, order entry, and documentation of the encounter time.    Morna Kendall, NP 06/29/2024 2:04 PM Medical Oncology and Hematology [facility] [address] [place], [place] [address] Tel. [phone]    Fax. [phone]  *Total Encounter Time as defined by the Centers for Medicare and Medicaid Services includes, in addition to the face-to-face time of a patient visit (documented in the note above) non-face-to-face time:  obtaining and reviewing outside history, ordering and reviewing medications, tests or procedures, care coordination (communications with other health care professionals or caregivers) and documentation in the medical record.

## 2024-06-30 ENCOUNTER — Inpatient Hospital Stay

## 2024-07-02 DIAGNOSIS — M47816 Spondylosis without myelopathy or radiculopathy, lumbar region: Secondary | ICD-10-CM | POA: Insufficient documentation

## 2024-07-02 NOTE — Progress Notes (Signed)
 " Radiation Oncology         [phone] ________________________________  Name: Kristina Pearson        MRN: [id number]  Date of Service: 07/06/2024 DOB: 03/27/1956  CC:Fry, Garnette LABOR, MD  Johnny Garnette LABOR, MD     REFERRING PHYSICIAN: Johnny Garnette LABOR, MD   DIAGNOSIS: The primary encounter diagnosis was Osteoarthritis of spine with radiculopathy, lumbar region. A diagnosis of Primary osteoarthritis of left knee was also pertinent to this visit. F52.183   Oncology History  Breast cancer of upper-outer quadrant of right female breast (HCC)  08/15/2013 Initial Diagnosis   DCIS with calcifications   08/30/2013 Surgery   Right breast lumpectomy: No evidence of residual DCIS found to have atypical lobular hyperplasia   09/26/2013 - 11/09/2013 Radiation Therapy   Adjuvant radiation therapy by Dr. Jason   11/21/2013 - 08/22/2015 Anti-estrogen oral therapy   Tamoxifen  20 mg daily stopped because patient was having problems with lack of interest in life which she attributed to tamoxifen .   10/01/2019 Genetic Testing   PTEN c.1115A>G (p.Asn372Ser) VUS, but otherwise Negative genetic testing on the multicancer gene and preliminary evidence colorectal cancer gene panel.  The Multi-Gene Panel offered by [facility] includes sequencing and/or deletion duplication testing of the following 91 genes: AIP, ALK, APC, ATM, AXIN2,BAP1,  BARD1, BLM, BMPR1A, BRCA1, BRCA2, BRIP1, BUB1B, CASR, CDC73, CDH1, CDK4, CDKN1B, CDKN1C, CDKN2A (p14ARF), CDKN2A (p16INK4a), CEBPA, CFP57, CHEK2, CTNNA1, DICER1, DIS3L2, EGFR (c.2369C>T, p.Thr790Met variant only), EPCAM (Deletion/duplication testing only), ENG, FH, FLCN, GALANT12, GATA2, GPC3, GREM1 (Promoter region deletion/duplication testing only), HOXB13 (c.251G>A, p.Gly84Glu), HRAS, KIT, MAX, MEN1, MET, MITF (c.952G>A, p.Glu318Lys variant only), MLH1, MSH2, MSH3, MSH6, MUTYH, NBN, NF1, NF2, NTHL1, PALB2, PDGFRA, PHOX2B, PMS2, POLD1, POLE, POT1, PRKAR1A, PTCH1, PTEN, RAD50, RAD51C, RAD51D,  RB1, RECQL4, RET, RNF43, RSP20, RUNX1, SDHAF2, SDHA (sequence changes only), SDHB, SDHC, SDHD, SMAD4, SMARCA4, SMARCB1, SMARCE1, STK11, SUFU, TERC, TERT, TMEM127, TP53, TSC1, TSC2, VHL, WRN and WT1. The report date is 10/01/2019.      HISTORY OF PRESENT ILLNESS: Kristina Pearson is a 69 y.o. female seen in consultation for radiation therapy.  She has a hx of DCIS of the R breast diagnosed in treated in 2015 with surgery and adjuvant radiation and tamoxifen . She completed less than 2 years of tamoxifen  due to side effects. She also has a hx of osteoarthritis for which she sees primary care and rheumatology. Her rheumatologist recommended referral for LDRT for which she presents today.  She has chronic neck/back and hip pain as well as more recent pain in her L knee, shin and ankle. For her aches and pains she takes tramadol , cyclobenzaprine , gabapentin  and Celebrex . She takes celebrex  daily and will be in bad shape if she misses it. Only occasionally uses tramadol  if pain is severe. Takes gabapentin  at night for nerve pain that goes down her legs and this is very helpful. She also uses heat therapy. Joint pains have worsened in setting of 40 lb gain a year or two ago. She has since started a GLP1 and has lost some weight. She has also tried systemic steroids as well as steroid injections. She reports disliking needing to take systemic steroids due to side effects like insomnia, palpitations and anxiety but sometimes the pain is so bad she is willing to endure those side effects. The above medicines and treatments are helpful but she is never without pain.   MRI Lumbar spine WO in July 2024 revealed generalized lumbar spine degeneration with progression  from prior imaging. She also has concomitant disc bulging resulting in nerve impingement, most pronounced at L2-3 causing L L3 impingement.   MRI Cervical spine WO from April 2022 revealed degenerative disc disease, spinal stenosis, foraminal impingement, facet  spurring and numerous osteophytes.  Given numerous joint aches and pains she has had an inflammatory work up with rheumatology. This was negative.   Of note, she is s/p R rotator cuff repair and R hip replacement in June 2023.   PREVIOUS RADIATION THERAPY: Yes to R breast in 2015  AUTOIMMUNE DISEASE: No  MEDICAL DEVICES: No  PREGNANCY: No   PAST MEDICAL HISTORY:  Past Medical History:  Diagnosis Date   ADHD    ADHD (attention deficit hyperactivity disorder)    ADD   Allergy    Anxiety    Arthritis    osteoarthritrs   Breast cancer (HCC) 08/15/13   right lateral upper outer   Depression    Family history of breast cancer    Family history of colon cancer    Family history of melanoma    Family history of ovarian cancer    Fibromyalgia    GERD (gastroesophageal reflux disease)    TUMS   Headache(784.0)    sees Dr. Asberry Dixons at [facility]    History of depression    Hx of radiation therapy 10/19/13- 11/09/13   right breast 4256 cGy in 16 sessions, hypo-fractionated   Hypothyroid    Migraine    Osteopenia    Personal history of radiation therapy 2015   PONV (postoperative nausea and vomiting)    Sialoadenitis    Spinal stenosis    Urinary incontinence    Vitiligo    Wears glasses        PAST SURGICAL HISTORY: Past Surgical History:  Procedure Laterality Date   BREAST BIOPSY Right 07/2013   BREAST LUMPECTOMY Right 08/2013   BREAST LUMPECTOMY WITH RADIOACTIVE SEED LOCALIZATION Right 08/30/2013   Procedure: RIGHT PARTIAL MASTECTOMY WITH RADIOACTIVE SEED LOCALIZATION;  Surgeon: Elon CHRISTELLA Pacini, MD;  Location: [facility];  Service: General;  Laterality: Right;   BREAST SURGERY     CHOLECYSTECTOMY  1996   lap   COLONOSCOPY  01/22/2020   per Dr. Donnald, benign polyps, repeat in 7 yrs    DIAGNOSTIC LAPAROSCOPY  1982   exp   LAPAROSCOPIC OVARIAN CYSTECTOMY     URETHRAL DILATION       FAMILY HISTORY:  Family History  Problem  Relation Age of Onset   Arthritis Mother    Diabetes Mother    Osteoporosis Mother    Polymyalgia rheumatica Mother        on prednisone   Heart disease Mother    Hyperlipidemia Mother    Hypertension Mother    Stroke Mother    Multiple myeloma Mother    Heart attack Father    Heart disease Father    Melanoma Father    Other Daughter        dermoid tumor   Other Maternal Aunt        COVID   Colon cancer Maternal Uncle        dx in his 40s   Leukemia Maternal Uncle 67       AML   Melanoma Maternal Uncle 27   Leukemia Maternal Uncle 76   Heart attack Paternal Uncle    Melanoma Paternal Uncle    Congestive Heart Failure Maternal Grandmother    Diabetes Maternal Grandmother  Diabetes Paternal Grandmother    Skin cancer Paternal Grandmother    Hypertension Paternal Grandmother    Melanoma Paternal Grandmother    Colon cancer Cousin 10       mother's maternal first cousin   Breast cancer Cousin 59       BRCA negative; father's maternal first cousin   Ovarian cancer Cousin 5       maternal cousin   Ovarian cancer Cousin 55       maternal cousin   Leukemia Cousin        dx in his 48s; maternal cousin; thought to be the result of taking Humera   Colon cancer Cousin 44       maternal second cousin   Breast cancer Other        maternal great aunt dx in her 74s   Ovarian cancer Other        maternal grandfather's sister dx in her 28s   Colon cancer Other        maternal grandfather's sister dx in her 45s   Colon cancer Other        maternal grandfather's sister dx in her 20s     SOCIAL HISTORY:  reports that she quit smoking about 45 years ago. Her smoking use included cigarettes. She has never used smokeless tobacco. She reports current alcohol use of about 1.0 - 2.0 standard drink of alcohol per week. She reports that she does not use drugs.   ALLERGIES: Patient has no known allergies.   MEDICATIONS:  Current Outpatient Medications  Medication Sig Dispense  Refill   AJOVY 225 MG/1.5ML SOAJ Inject into the skin every 30 (thirty) days.     ALPRAZolam  (XANAX ) 0.5 MG tablet Take 1 tablet (0.5 mg total) by mouth 2 (two) times daily as needed for anxiety. 60 tablet 5   aspirin  EC 81 MG tablet Take 1 tablet (81 mg total) by mouth daily. 1 tablet 0   B Complex Vitamins (B COMPLEX-B12 PO) Take by mouth.     celecoxib  (CELEBREX ) 200 MG capsule Take 200 mg by mouth daily.     cycloSPORINE (RESTASIS) 0.05 % ophthalmic emulsion Place 1 drop into both eyes 2 (two) times daily.     estradiol  (ESTRACE ) 0.01 % CREA vaginal cream Apply 3 days a week 42.5 g 12   gabapentin  (NEURONTIN ) 100 MG capsule TAKE 1 CAPSULE BY MOUTH 3 TIMES DAILY AS NEEDED (HEADACHES). 90 capsule 5   Levocetirizine Dihydrochloride (XYZAL ALLERGY 24HR PO) Take by mouth.     levothyroxine  (SYNTHROID , LEVOTHROID) 112 MCG tablet Take 112 mcg by mouth daily before breakfast.     Magnesium 400 MG CAPS Take by mouth daily.     meclizine  (ANTIVERT ) 25 MG tablet Take 1 tablet (25 mg total) by mouth every 4 (four) hours as needed for dizziness or nausea. 60 tablet 3   metFORMIN (GLUCOPHAGE-XR) 500 MG 24 hr tablet Take 500 mg by mouth daily.     Multiple Vitamin (MULTIVITAMIN) tablet Take 1 tablet by mouth daily. One a day for over age 75     NURTEC 75 MG TBDP MAY TAKE 1 TABLET EVERY DAY AS NEEDED FOR MIGRAINE RESCUE     SUMAtriptan  (IMITREX ) 100 MG tablet Take 1 tablet (100 mg total) by mouth as needed for migraine. May repeat in 2 hours if headache persists or recurs. 10 tablet 11   traMADol  (ULTRAM ) 50 MG tablet TAKE 2 TABLETS BY MOUTH EVERY 6 HOURS AS NEEDED FOR MODERATE PAIN  120 tablet 5   TURMERIC PO Take 1,200 mg by mouth daily at 12 noon.     UNABLE TO FIND CBD oil 20 mg - half a dropper at bedtime for pain or scalp psoriases     No current facility-administered medications for this encounter.     REVIEW OF SYSTEMS: ROS per HPI.    PHYSICAL EXAM:  Wt Readings from Last 3 Encounters:   07/06/24 181 lb 3.2 oz (82.2 kg)  06/29/24 185 lb 8 oz (84.1 kg)  05/09/24 180 lb (81.6 kg)   Temp Readings from Last 3 Encounters:  07/06/24 97.9 F (36.6 C)  06/29/24 97.9 F (36.6 C) (Temporal)  05/09/24 98 F (36.7 C) (Oral)   BP Readings from Last 3 Encounters:  07/06/24 132/83  06/29/24 (!) 123/59  05/09/24 118/74   Pulse Readings from Last 3 Encounters:  07/06/24 88  06/29/24 76  05/09/24 79   Pain Assessment Pain Score: 6  Pain Loc: Hip/10   Physical Exam Vitals and nursing note reviewed.  Constitutional:      General: She is not in acute distress. HENT:     Head: Normocephalic and atraumatic.  Eyes:     Extraocular Movements: Extraocular movements intact.  Cardiovascular:     Rate and Rhythm: Normal rate.  Pulmonary:     Effort: Pulmonary effort is normal. No respiratory distress.  Abdominal:     General: There is no distension.  Musculoskeletal:        General: No deformity.     Cervical back: Normal range of motion.  Skin:    Coloration: Skin is not jaundiced or pale.  Neurological:     General: No focal deficit present.     Mental Status: She is alert.     Cranial Nerves: No cranial nerve deficit.  Psychiatric:        Mood and Affect: Mood normal.      LABORATORY DATA:  Lab Results  Component Value Date   WBC 6.7 07/03/2024   HGB 14.1 07/03/2024   HCT 41.1 07/03/2024   MCV 91.9 07/03/2024   PLT 261 07/03/2024   Lab Results  Component Value Date   NA 137 07/03/2024   K 4.6 07/03/2024   CL 100 07/03/2024   CO2 26 07/03/2024   Lab Results  Component Value Date   ALT 15 07/03/2024   AST 22 07/03/2024   ALKPHOS 68 07/03/2024   BILITOT 0.4 07/03/2024      RADIOGRAPHY:   MRI Lumbar Spine WO 12/27/2022:  CLINICAL DATA:  Low back pain with sacral pain extending into the left hip for several years   TECHNIQUE: Multiplanar, multisequence MR imaging of the lumbar spine was performed. No intravenous contrast was administered.    COMPARISON:  12/08/2013   FINDINGS: Segmentation:  Standard.   Alignment:  Levoscoliosis.  Straightening of lumbar lordosis   Vertebrae:  No fracture, evidence of discitis, or bone lesion.   Conus medullaris and cauda equina: Conus extends to the L1-2 level. Conus and cauda equina appear normal.   Paraspinal and other soft tissues: Negative for perispinal mass or inflammation.   Disc levels:   T12- L1: Unremarkable.   L1-L2: Mild disc bulging.   L2-L3: Disc narrowing and bulging with left paracentral extrusion that is downward pointing and impinges on the left L3 nerve root.   L3-L4: Disc narrowing with bulging and endplate/facet spurring eccentric to the right. The canal and foramina are patent   L4-L5: Degenerative facet spurring asymmetric  to the right. Circumferential disc bulging.   L5-S1:Mild degenerative facet spurring.   IMPRESSION: Generalized lumbar spine degeneration with progression from 2015. Notable left paracentral extrusion at L2-3 causing left L3 impingement.    MRI Cervical Spine WO 10/09/20:  MRI CERVICAL SPINE WITHOUT CONTRAST   TECHNIQUE: Multiplanar, multisequence MR imaging of the cervical spine was performed. No intravenous contrast was administered.   COMPARISON:  Cervical spine CT 05/13/2020.  Cervical MRI 04/26/2014   FINDINGS: Alignment: Degenerative reversal of cervical lordosis.   Vertebrae: No fracture, evidence of discitis, or bone lesion.   Cord: Normal signal and morphology.   Posterior Fossa, vertebral arteries, paraspinal tissues: Negative.   Disc levels:   C2-3: Mild facet spurring.   C3-4: Facet spurring with ankylosis on the left   C4-5: Disc narrowing and bulging with uncovertebral ridging asymmetric to the right. Moderate right foraminal stenosis. Patent spinal canal   C5-6: Disc narrowing and bulging with central disc protrusion and buttressing osteophytes flattening the ventral cord. Asymmetric  left uncovertebral spurring and left foraminal impingement. Right foraminal narrowing is mild.   C6-7: Disc narrowing and bulging with endplate and uncovertebral ridging eccentric to the left. Left foraminal impingement.   C7-T1:Unremarkable.   IMPRESSION: 1. Disc degeneration mainly at C4-5 to C6-7 with progression from 2015. 2. Spinal stenosis at C5-6 and C6-7. At C5-6 there is ventral cord flattening. 3. Foraminal impingement most notable on the right at C4-5 and left at C5-6, C6-7.   PATHOLOGY: no pertinent pathology     IMPRESSION/PLAN:  Ms. Shuping is a 69 yo F w/ a hx of chronic joint pain/chronic and severe osteoarthritis, most pronounced in the L hip/SI joint and cervical spine.  After a detailed discussion of the patients history, prior treatment response, and current goals, we reviewed the proposed protocol for LDRT targeting the L hip/SI joint and cervical spine. Our institutional approach follows a regimen of 3 Gy total, delivered in 6 fractions of 0.5 Gy per fraction over 2-3 weeks. This dosing is consistent with published guidelines and peer-reviewed data for non-malignant musculoskeletal conditions.  We reviewed:   Goals of care: symptom relief and improved mobility Expected timeline for therapeutic benefit: typically several weeks to months Potential risks, including rare but theoretical concerns regarding late tissue effects or secondary malignancy (risk estimated to be extremely low at this dose and age) Lack of immunosuppression and inactive autoimmune history, minimizing concern for immune-mediated complications   My goal with low dose radiation therapy is to help reduce chronic joint pain and improve mobility, particularly for activities like traveling, exercising, swimming and enjoying time with her grandbaby. This treatment does not reverse joint damage, but it can calm the inflammation in and around the joint that contributes to pain. Based on published data,  about 60-80% of patients with osteoarthritis experience meaningful pain relief within a few weeks to months after completing a short course of low-dose radiation. While not everyone responds, the majority of patients do report improvement, and the treatment is generally well tolerated.  The patient was agreeable to proceed. We will arrange CT simulation for planning and initiate treatment pending final review and setup. A follow-up visit will be scheduled approximately 8-12 weeks after completion of LDRT to assess clinical response.   We personally spent 60 minutes in this encounter including chart review, reviewing radiological studies, meeting face-to-face with the patient, entering orders and completing documentation.    Estefana HERO. Maritza, M.D.   "

## 2024-07-03 ENCOUNTER — Inpatient Hospital Stay

## 2024-07-03 ENCOUNTER — Ambulatory Visit: Admitting: Radiation Oncology

## 2024-07-03 ENCOUNTER — Ambulatory Visit

## 2024-07-03 ENCOUNTER — Ambulatory Visit: Payer: Self-pay

## 2024-07-03 DIAGNOSIS — C50411 Malignant neoplasm of upper-outer quadrant of right female breast: Secondary | ICD-10-CM

## 2024-07-03 LAB — CBC WITH DIFFERENTIAL (CANCER CENTER ONLY)
Abs Immature Granulocytes: 0.01 K/uL (ref 0.00–0.07)
Basophils Absolute: 0.1 K/uL (ref 0.0–0.1)
Basophils Relative: 1 %
Eosinophils Absolute: 0.2 K/uL (ref 0.0–0.5)
Eosinophils Relative: 3 %
HCT: 41.1 % (ref 36.0–46.0)
Hemoglobin: 14.1 g/dL (ref 12.0–15.0)
Immature Granulocytes: 0 %
Lymphocytes Relative: 25 %
Lymphs Abs: 1.7 K/uL (ref 0.7–4.0)
MCH: 31.5 pg (ref 26.0–34.0)
MCHC: 34.3 g/dL (ref 30.0–36.0)
MCV: 91.9 fL (ref 80.0–100.0)
Monocytes Absolute: 0.5 K/uL (ref 0.1–1.0)
Monocytes Relative: 7 %
Neutro Abs: 4.3 K/uL (ref 1.7–7.7)
Neutrophils Relative %: 64 %
Platelet Count: 261 K/uL (ref 150–400)
RBC: 4.47 MIL/uL (ref 3.87–5.11)
RDW: 12 % (ref 11.5–15.5)
WBC Count: 6.7 K/uL (ref 4.0–10.5)
nRBC: 0 % (ref 0.0–0.2)

## 2024-07-03 LAB — CMP (CANCER CENTER ONLY)
ALT: 15 U/L (ref 0–44)
AST: 22 U/L (ref 15–41)
Albumin: 4.3 g/dL (ref 3.5–5.0)
Alkaline Phosphatase: 68 U/L (ref 38–126)
Anion gap: 11 (ref 5–15)
BUN: 14 mg/dL (ref 8–23)
CO2: 26 mmol/L (ref 22–32)
Calcium: 9.7 mg/dL (ref 8.9–10.3)
Chloride: 100 mmol/L (ref 98–111)
Creatinine: 0.97 mg/dL (ref 0.44–1.00)
GFR, Estimated: >60 mL/min
Glucose, Bld: 111 mg/dL — ABNORMAL HIGH (ref 70–99)
Potassium: 4.6 mmol/L (ref 3.5–5.1)
Sodium: 137 mmol/L (ref 135–145)
Total Bilirubin: 0.4 mg/dL (ref 0.0–1.2)
Total Protein: 7.7 g/dL (ref 6.5–8.1)

## 2024-07-03 LAB — LACTATE DEHYDROGENASE: LDH: 174 U/L (ref 105–235)

## 2024-07-05 NOTE — Progress Notes (Signed)
 Site of osteoarthritis:M15.0 (ICD-10-CM) - Primary osteoarthritis involving multiple joints   How long have you had pain? Lower back and hip pain within the last year. Left knee (3-4) years Left ankle (3-4)years.  What over the counter or prescription medications have you tried? Celebrex  and Tramadol  but causes  and Methocarbamol  and a CB oil. Steroid injections last taken October 2025.  Does anything make the pain better or worse? Taking Celebrex  anf freah ginger and tumeric seems to help.       Ambulatory status? Walker? Wheelchair?: Ambulatory  SAFETY ISSUES: Prior radiation? Yes, Right breast in 2015 Pacemaker/ICD? No Possible current pregnancy? No Is the patient on methotrexate? No  Current Complaints / other details:   BP 132/83 (BP Location: Left Arm, Patient Position: Sitting, Cuff Size: Large)   Pulse 88   Temp 97.9 F (36.6 C)   Resp 20   Ht 5' 5.75 (1.67 m)   Wt 181 lb 3.2 oz (82.2 kg)   LMP 09/21/2006   SpO2 98%   BMI 29.47 kg/m

## 2024-07-06 ENCOUNTER — Encounter: Payer: Self-pay | Admitting: Radiation Oncology

## 2024-07-06 ENCOUNTER — Ambulatory Visit
Admission: RE | Admit: 2024-07-06 | Discharge: 2024-07-06 | Disposition: A | Discharge status: Home / Self Care | Source: Ambulatory Visit | Attending: Radiation Oncology | Admitting: Radiation Oncology

## 2024-07-06 VITALS — BP 132/83 | HR 88 | Temp 97.9°F | Resp 20 | Ht 65.75 in | Wt 181.2 lb

## 2024-07-06 DIAGNOSIS — M8580 Other specified disorders of bone density and structure, unspecified site: Secondary | ICD-10-CM | POA: Diagnosis not present

## 2024-07-06 DIAGNOSIS — Z7984 Long term (current) use of oral hypoglycemic drugs: Secondary | ICD-10-CM | POA: Diagnosis not present

## 2024-07-06 DIAGNOSIS — M50121 Cervical disc disorder at C4-C5 level with radiculopathy: Secondary | ICD-10-CM | POA: Insufficient documentation

## 2024-07-06 DIAGNOSIS — Z7982 Long term (current) use of aspirin: Secondary | ICD-10-CM | POA: Diagnosis not present

## 2024-07-06 DIAGNOSIS — M5116 Intervertebral disc disorders with radiculopathy, lumbar region: Secondary | ICD-10-CM | POA: Insufficient documentation

## 2024-07-06 DIAGNOSIS — M797 Fibromyalgia: Secondary | ICD-10-CM | POA: Insufficient documentation

## 2024-07-06 DIAGNOSIS — Z79899 Other long term (current) drug therapy: Secondary | ICD-10-CM | POA: Insufficient documentation

## 2024-07-06 DIAGNOSIS — M4726 Other spondylosis with radiculopathy, lumbar region: Secondary | ICD-10-CM | POA: Insufficient documentation

## 2024-07-06 DIAGNOSIS — Z8041 Family history of malignant neoplasm of ovary: Secondary | ICD-10-CM | POA: Diagnosis not present

## 2024-07-06 DIAGNOSIS — Z800 Family history of malignant neoplasm of digestive organs: Secondary | ICD-10-CM | POA: Insufficient documentation

## 2024-07-06 DIAGNOSIS — Z791 Long term (current) use of non-steroidal anti-inflammatories (NSAID): Secondary | ICD-10-CM | POA: Diagnosis not present

## 2024-07-06 DIAGNOSIS — K219 Gastro-esophageal reflux disease without esophagitis: Secondary | ICD-10-CM | POA: Insufficient documentation

## 2024-07-06 DIAGNOSIS — M1712 Unilateral primary osteoarthritis, left knee: Secondary | ICD-10-CM

## 2024-07-06 DIAGNOSIS — M792 Neuralgia and neuritis, unspecified: Secondary | ICD-10-CM | POA: Insufficient documentation

## 2024-07-06 DIAGNOSIS — Z803 Family history of malignant neoplasm of breast: Secondary | ICD-10-CM | POA: Diagnosis not present

## 2024-07-06 DIAGNOSIS — Z923 Personal history of irradiation: Secondary | ICD-10-CM | POA: Diagnosis not present

## 2024-07-06 DIAGNOSIS — M533 Sacrococcygeal disorders, not elsewhere classified: Secondary | ICD-10-CM | POA: Insufficient documentation

## 2024-07-06 DIAGNOSIS — L80 Vitiligo: Secondary | ICD-10-CM | POA: Insufficient documentation

## 2024-07-06 DIAGNOSIS — E039 Hypothyroidism, unspecified: Secondary | ICD-10-CM | POA: Insufficient documentation

## 2024-07-06 DIAGNOSIS — Z853 Personal history of malignant neoplasm of breast: Secondary | ICD-10-CM | POA: Insufficient documentation

## 2024-07-06 DIAGNOSIS — Z808 Family history of malignant neoplasm of other organs or systems: Secondary | ICD-10-CM | POA: Insufficient documentation

## 2024-07-06 DIAGNOSIS — Z79890 Hormone replacement therapy (postmenopausal): Secondary | ICD-10-CM | POA: Insufficient documentation

## 2024-07-06 DIAGNOSIS — M150 Primary generalized (osteo)arthritis: Secondary | ICD-10-CM

## 2024-07-06 DIAGNOSIS — M1612 Unilateral primary osteoarthritis, left hip: Secondary | ICD-10-CM | POA: Insufficient documentation

## 2024-07-10 ENCOUNTER — Other Ambulatory Visit: Payer: Self-pay | Admitting: Family Medicine

## 2024-07-11 ENCOUNTER — Encounter: Payer: Self-pay | Admitting: Adult Health

## 2024-07-14 ENCOUNTER — Other Ambulatory Visit: Payer: Self-pay | Admitting: Adult Health

## 2024-07-14 DIAGNOSIS — N644 Mastodynia: Secondary | ICD-10-CM

## 2024-07-14 DIAGNOSIS — Z853 Personal history of malignant neoplasm of breast: Secondary | ICD-10-CM

## 2024-07-17 ENCOUNTER — Encounter: Payer: Self-pay | Admitting: Family Medicine

## 2024-07-17 ENCOUNTER — Ambulatory Visit: Admitting: Radiation Oncology

## 2024-07-18 ENCOUNTER — Ambulatory Visit: Admitting: Family Medicine

## 2024-07-18 ENCOUNTER — Other Ambulatory Visit: Payer: Self-pay

## 2024-07-18 MED ORDER — LEVOTHYROXINE SODIUM 112 MCG PO TABS: 112.0000 ug | ORAL_TABLET | Freq: Every day | ORAL | 1 refills | Status: AC

## 2024-07-19 ENCOUNTER — Ambulatory Visit
Admission: RE | Admit: 2024-07-19 | Discharge: 2024-07-19 | Disposition: A | Discharge status: Home / Self Care | Source: Ambulatory Visit | Attending: Radiation Oncology | Admitting: Radiation Oncology

## 2024-07-19 ENCOUNTER — Ambulatory Visit (HOSPITAL_COMMUNITY)
Admission: RE | Admit: 2024-07-19 | Discharge: 2024-07-19 | Disposition: A | Source: Ambulatory Visit | Attending: Radiation Oncology

## 2024-07-19 DIAGNOSIS — M7732 Calcaneal spur, left foot: Secondary | ICD-10-CM | POA: Insufficient documentation

## 2024-07-19 DIAGNOSIS — M4726 Other spondylosis with radiculopathy, lumbar region: Secondary | ICD-10-CM | POA: Insufficient documentation

## 2024-07-19 DIAGNOSIS — M19072 Primary osteoarthritis, left ankle and foot: Secondary | ICD-10-CM | POA: Diagnosis present

## 2024-07-19 DIAGNOSIS — M150 Primary generalized (osteo)arthritis: Secondary | ICD-10-CM

## 2024-07-25 ENCOUNTER — Ambulatory Visit: Admitting: Radiation Oncology

## 2024-07-25 ENCOUNTER — Ambulatory Visit
Admission: RE | Admit: 2024-07-25 | Discharge: 2024-07-25 | Disposition: A | Source: Ambulatory Visit | Attending: Radiation Oncology | Admitting: Radiation Oncology

## 2024-07-25 ENCOUNTER — Other Ambulatory Visit: Payer: Self-pay

## 2024-07-25 LAB — RAD ONC ARIA SESSION SUMMARY
Course Elapsed Days: 0
Course Elapsed Days: 0
Plan Fractions Treated to Date: 1
Plan Fractions Treated to Date: 1
Plan Prescribed Dose Per Fraction: 0.5 Gy
Plan Prescribed Dose Per Fraction: 0.5 Gy
Plan Total Fractions Prescribed: 6
Plan Total Fractions Prescribed: 6
Plan Total Prescribed Dose: 3 Gy
Plan Total Prescribed Dose: 3 Gy
Reference Point Dosage Given to Date: 0.5 Gy
Reference Point Dosage Given to Date: 0.5 Gy
Reference Point Session Dosage Given: 0.5 Gy
Reference Point Session Dosage Given: 0.5 Gy
Session Number: 1
Session Number: 2

## 2024-07-26 ENCOUNTER — Ambulatory Visit

## 2024-07-27 ENCOUNTER — Ambulatory Visit: Admission: RE | Admit: 2024-07-27 | Discharge: 2024-07-27 | Attending: Radiation Oncology

## 2024-07-27 ENCOUNTER — Other Ambulatory Visit: Payer: Self-pay

## 2024-07-27 LAB — RAD ONC ARIA SESSION SUMMARY
Course Elapsed Days: 2
Plan Fractions Treated to Date: 2
Plan Fractions Treated to Date: 2
Plan Prescribed Dose Per Fraction: 0.5 Gy
Plan Prescribed Dose Per Fraction: 0.5 Gy
Plan Total Fractions Prescribed: 6
Plan Total Fractions Prescribed: 6
Plan Total Prescribed Dose: 3 Gy
Plan Total Prescribed Dose: 3 Gy
Reference Point Dosage Given to Date: 1 Gy
Reference Point Dosage Given to Date: 1 Gy
Reference Point Session Dosage Given: 0.5 Gy
Reference Point Session Dosage Given: 0.5 Gy
Session Number: 3

## 2024-07-28 ENCOUNTER — Ambulatory Visit

## 2024-07-31 ENCOUNTER — Ambulatory Visit

## 2024-08-01 ENCOUNTER — Ambulatory Visit

## 2024-08-03 ENCOUNTER — Ambulatory Visit

## 2024-08-07 ENCOUNTER — Encounter

## 2024-08-07 ENCOUNTER — Other Ambulatory Visit

## 2024-08-08 ENCOUNTER — Ambulatory Visit: Admitting: Radiation Oncology

## 2024-08-10 ENCOUNTER — Ambulatory Visit

## 2024-08-14 ENCOUNTER — Ambulatory Visit

## 2024-08-16 ENCOUNTER — Ambulatory Visit

## 2024-08-18 ENCOUNTER — Ambulatory Visit

## 2024-08-21 ENCOUNTER — Ambulatory Visit

## 2025-06-29 ENCOUNTER — Inpatient Hospital Stay: Admitting: Adult Health

## 2025-06-29 ENCOUNTER — Inpatient Hospital Stay
